# Patient Record
Sex: Female | Born: 1955 | Race: White | Hispanic: No | Smoking: Former smoker
Health system: Southern US, Community
[De-identification: ages and names within clinical notes are randomized; demographics above are authoritative.]

## PROBLEM LIST (undated history)

## (undated) DIAGNOSIS — I10 Essential (primary) hypertension: Secondary | ICD-10-CM

## (undated) DIAGNOSIS — Z8614 Personal history of Methicillin resistant Staphylococcus aureus infection: Secondary | ICD-10-CM

## (undated) DIAGNOSIS — F419 Anxiety disorder, unspecified: Secondary | ICD-10-CM

## (undated) DIAGNOSIS — E119 Type 2 diabetes mellitus without complications: Secondary | ICD-10-CM

## (undated) DIAGNOSIS — F32A Depression, unspecified: Secondary | ICD-10-CM

## (undated) DIAGNOSIS — T4145XA Adverse effect of unspecified anesthetic, initial encounter: Secondary | ICD-10-CM

## (undated) DIAGNOSIS — T8859XA Other complications of anesthesia, initial encounter: Secondary | ICD-10-CM

## (undated) DIAGNOSIS — R42 Dizziness and giddiness: Secondary | ICD-10-CM

## (undated) DIAGNOSIS — R911 Solitary pulmonary nodule: Secondary | ICD-10-CM

## (undated) DIAGNOSIS — Z9889 Other specified postprocedural states: Secondary | ICD-10-CM

## (undated) DIAGNOSIS — I2699 Other pulmonary embolism without acute cor pulmonale: Secondary | ICD-10-CM

## (undated) DIAGNOSIS — J189 Pneumonia, unspecified organism: Secondary | ICD-10-CM

## (undated) DIAGNOSIS — Z87442 Personal history of urinary calculi: Secondary | ICD-10-CM

## (undated) DIAGNOSIS — R112 Nausea with vomiting, unspecified: Secondary | ICD-10-CM

## (undated) DIAGNOSIS — K759 Inflammatory liver disease, unspecified: Secondary | ICD-10-CM

## (undated) DIAGNOSIS — F329 Major depressive disorder, single episode, unspecified: Secondary | ICD-10-CM

## (undated) DIAGNOSIS — Z87898 Personal history of other specified conditions: Secondary | ICD-10-CM

## (undated) DIAGNOSIS — J449 Chronic obstructive pulmonary disease, unspecified: Secondary | ICD-10-CM

## (undated) DIAGNOSIS — I82419 Acute embolism and thrombosis of unspecified femoral vein: Secondary | ICD-10-CM

## (undated) DIAGNOSIS — J45909 Unspecified asthma, uncomplicated: Secondary | ICD-10-CM

## (undated) DIAGNOSIS — Z8719 Personal history of other diseases of the digestive system: Secondary | ICD-10-CM

## (undated) DIAGNOSIS — K219 Gastro-esophageal reflux disease without esophagitis: Secondary | ICD-10-CM

## (undated) DIAGNOSIS — I82409 Acute embolism and thrombosis of unspecified deep veins of unspecified lower extremity: Secondary | ICD-10-CM

## (undated) DIAGNOSIS — G4730 Sleep apnea, unspecified: Secondary | ICD-10-CM

## (undated) HISTORY — PX: DIAGNOSTIC LAPAROSCOPY: SUR761

## (undated) HISTORY — PX: TOTAL KNEE ARTHROPLASTY: SHX125

---

## 1992-05-22 HISTORY — PX: ABDOMINAL HYSTERECTOMY: SHX81

## 2004-06-29 ENCOUNTER — Ambulatory Visit: Payer: Self-pay

## 2004-07-18 ENCOUNTER — Ambulatory Visit: Payer: Self-pay

## 2004-08-28 ENCOUNTER — Emergency Department: Payer: Self-pay | Admitting: Emergency Medicine

## 2005-02-19 ENCOUNTER — Emergency Department: Payer: Self-pay | Admitting: Emergency Medicine

## 2005-03-01 ENCOUNTER — Ambulatory Visit: Payer: Self-pay | Admitting: Family Medicine

## 2005-05-04 ENCOUNTER — Ambulatory Visit: Payer: Self-pay | Admitting: Family Medicine

## 2005-05-22 ENCOUNTER — Other Ambulatory Visit: Payer: Self-pay

## 2005-05-22 ENCOUNTER — Emergency Department: Payer: Self-pay | Admitting: Internal Medicine

## 2005-09-27 ENCOUNTER — Ambulatory Visit: Payer: Self-pay

## 2006-01-15 ENCOUNTER — Ambulatory Visit: Payer: Self-pay | Admitting: Family Medicine

## 2006-01-20 ENCOUNTER — Ambulatory Visit: Payer: Self-pay | Admitting: Family Medicine

## 2006-02-19 ENCOUNTER — Ambulatory Visit: Payer: Self-pay | Admitting: Family Medicine

## 2006-04-09 ENCOUNTER — Emergency Department: Payer: Self-pay | Admitting: Emergency Medicine

## 2006-06-10 ENCOUNTER — Emergency Department: Payer: Self-pay | Admitting: Emergency Medicine

## 2006-10-05 ENCOUNTER — Ambulatory Visit: Payer: Self-pay | Admitting: Family Medicine

## 2006-12-21 ENCOUNTER — Emergency Department: Payer: Self-pay | Admitting: Emergency Medicine

## 2006-12-26 ENCOUNTER — Other Ambulatory Visit: Payer: Self-pay

## 2006-12-26 ENCOUNTER — Ambulatory Visit: Payer: Self-pay

## 2006-12-26 ENCOUNTER — Emergency Department: Payer: Self-pay | Admitting: Emergency Medicine

## 2007-02-20 ENCOUNTER — Emergency Department: Payer: Self-pay | Admitting: Internal Medicine

## 2007-02-20 ENCOUNTER — Other Ambulatory Visit: Payer: Self-pay

## 2007-06-20 ENCOUNTER — Emergency Department: Payer: Self-pay | Admitting: Emergency Medicine

## 2007-07-10 ENCOUNTER — Emergency Department: Payer: Self-pay | Admitting: Emergency Medicine

## 2007-07-15 ENCOUNTER — Emergency Department: Payer: Self-pay | Admitting: Emergency Medicine

## 2007-07-15 ENCOUNTER — Other Ambulatory Visit: Payer: Self-pay

## 2007-08-01 ENCOUNTER — Ambulatory Visit: Payer: Self-pay | Admitting: Family

## 2007-08-02 ENCOUNTER — Ambulatory Visit: Payer: Self-pay | Admitting: Gastroenterology

## 2007-08-09 ENCOUNTER — Ambulatory Visit: Payer: Self-pay | Admitting: General Surgery

## 2007-08-12 ENCOUNTER — Ambulatory Visit: Payer: Self-pay | Admitting: General Surgery

## 2008-03-31 ENCOUNTER — Ambulatory Visit: Payer: Self-pay

## 2008-04-29 ENCOUNTER — Ambulatory Visit: Payer: Self-pay | Admitting: Gastroenterology

## 2008-05-21 ENCOUNTER — Ambulatory Visit: Payer: Self-pay

## 2008-07-13 ENCOUNTER — Ambulatory Visit: Payer: Self-pay | Admitting: Internal Medicine

## 2008-09-24 ENCOUNTER — Observation Stay: Payer: Self-pay | Admitting: Internal Medicine

## 2008-11-15 ENCOUNTER — Emergency Department: Payer: Self-pay | Admitting: Unknown Physician Specialty

## 2009-04-13 ENCOUNTER — Emergency Department: Payer: Self-pay | Admitting: Emergency Medicine

## 2009-06-03 ENCOUNTER — Emergency Department: Payer: Self-pay | Admitting: Emergency Medicine

## 2009-08-03 ENCOUNTER — Emergency Department: Payer: Self-pay | Admitting: Emergency Medicine

## 2009-10-13 ENCOUNTER — Observation Stay: Payer: Self-pay | Admitting: Internal Medicine

## 2010-01-08 ENCOUNTER — Emergency Department: Payer: Self-pay | Admitting: Emergency Medicine

## 2010-01-27 ENCOUNTER — Emergency Department: Payer: Self-pay | Admitting: Emergency Medicine

## 2010-03-08 ENCOUNTER — Emergency Department: Payer: Self-pay | Admitting: Internal Medicine

## 2010-07-11 ENCOUNTER — Observation Stay: Payer: Self-pay | Admitting: Internal Medicine

## 2010-11-08 ENCOUNTER — Ambulatory Visit: Payer: Self-pay | Admitting: Emergency Medicine

## 2010-11-11 LAB — PATHOLOGY REPORT

## 2011-03-25 ENCOUNTER — Emergency Department: Payer: Self-pay | Admitting: Internal Medicine

## 2011-05-23 DIAGNOSIS — Z8614 Personal history of Methicillin resistant Staphylococcus aureus infection: Secondary | ICD-10-CM

## 2011-05-23 HISTORY — PX: CHOLECYSTECTOMY: SHX55

## 2011-05-23 HISTORY — DX: Personal history of Methicillin resistant Staphylococcus aureus infection: Z86.14

## 2011-05-23 HISTORY — PX: CYST REMOVAL NECK: SHX6281

## 2011-05-30 ENCOUNTER — Inpatient Hospital Stay: Payer: Self-pay | Admitting: Surgery

## 2011-05-30 LAB — COMPREHENSIVE METABOLIC PANEL
Albumin: 2.7 g/dL — ABNORMAL LOW (ref 3.4–5.0)
Anion Gap: 9 (ref 7–16)
BUN: 9 mg/dL (ref 7–18)
Bilirubin,Total: 0.8 mg/dL (ref 0.2–1.0)
Chloride: 97 mmol/L — ABNORMAL LOW (ref 98–107)
Co2: 30 mmol/L (ref 21–32)
Creatinine: 0.95 mg/dL (ref 0.60–1.30)
EGFR (African American): >60
EGFR (Non-African Amer.): >60
Glucose: 490 mg/dL — ABNORMAL HIGH (ref 65–99)
Osmolality: 292 (ref 275–301)
Potassium: 3.7 mmol/L (ref 3.5–5.1)
SGOT(AST): 43 U/L — ABNORMAL HIGH (ref 15–37)
Total Protein: 7.4 g/dL (ref 6.4–8.2)

## 2011-05-30 LAB — CBC
HCT: 39.4 % (ref 35.0–47.0)
MCHC: 33.3 g/dL (ref 32.0–36.0)
Platelet: 219 10*3/uL (ref 150–440)
RDW: 13.4 % (ref 11.5–14.5)

## 2011-06-01 LAB — CBC WITH DIFFERENTIAL/PLATELET
Basophil #: 0 10*3/uL (ref 0.0–0.1)
Basophil %: 0.5 %
Eosinophil #: 0.5 10*3/uL (ref 0.0–0.7)
HCT: 31.8 % — ABNORMAL LOW (ref 35.0–47.0)
Lymphocyte #: 1.5 10*3/uL (ref 1.0–3.6)
MCHC: 33.5 g/dL (ref 32.0–36.0)
MCV: 86 fL (ref 80–100)
Monocyte %: 5.6 %
Neutrophil #: 5.5 10*3/uL (ref 1.4–6.5)
RBC: 3.7 10*6/uL — ABNORMAL LOW (ref 3.80–5.20)
RDW: 13.4 % (ref 11.5–14.5)
WBC: 8 10*3/uL (ref 3.6–11.0)

## 2011-06-01 LAB — BASIC METABOLIC PANEL
Calcium, Total: 8.6 mg/dL (ref 8.5–10.1)
Co2: 30 mmol/L (ref 21–32)
Creatinine: 0.54 mg/dL — ABNORMAL LOW (ref 0.60–1.30)
EGFR (Non-African Amer.): >60
Osmolality: 279 (ref 275–301)

## 2011-06-02 LAB — CLOSTRIDIUM DIFFICILE BY PCR

## 2011-06-03 LAB — WOUND CULTURE

## 2011-06-04 LAB — CULTURE, BLOOD (SINGLE)

## 2011-06-05 LAB — CULTURE, BLOOD (SINGLE)

## 2011-06-06 LAB — CULTURE, BLOOD (SINGLE)

## 2011-07-04 LAB — CULTURE, BLOOD (SINGLE)

## 2011-12-05 ENCOUNTER — Emergency Department: Payer: Self-pay | Admitting: Emergency Medicine

## 2011-12-05 LAB — CBC
HCT: 40.8 % (ref 35.0–47.0)
HGB: 13.9 g/dL (ref 12.0–16.0)
MCH: 28.8 pg (ref 26.0–34.0)
MCHC: 34.1 g/dL (ref 32.0–36.0)
RBC: 4.83 10*6/uL (ref 3.80–5.20)
RDW: 13.6 % (ref 11.5–14.5)

## 2011-12-05 LAB — BASIC METABOLIC PANEL
BUN: 14 mg/dL (ref 7–18)
Calcium, Total: 8.9 mg/dL (ref 8.5–10.1)
Co2: 28 mmol/L (ref 21–32)
Creatinine: 0.74 mg/dL (ref 0.60–1.30)
Glucose: 223 mg/dL — ABNORMAL HIGH (ref 65–99)
Osmolality: 285 (ref 275–301)
Potassium: 3.4 mmol/L — ABNORMAL LOW (ref 3.5–5.1)

## 2011-12-07 ENCOUNTER — Ambulatory Visit: Payer: Self-pay | Admitting: Surgery

## 2012-04-14 ENCOUNTER — Ambulatory Visit: Payer: Self-pay | Admitting: Family Medicine

## 2012-04-16 ENCOUNTER — Emergency Department: Payer: Self-pay | Admitting: Emergency Medicine

## 2012-04-16 LAB — URINALYSIS, COMPLETE
Bilirubin,UR: NEGATIVE
Blood: NEGATIVE
Glucose,UR: NEGATIVE mg/dL (ref 0–75)
Ketone: NEGATIVE
Nitrite: NEGATIVE
Ph: 6 (ref 4.5–8.0)
Protein: NEGATIVE
RBC,UR: 5 /HPF (ref 0–5)
Specific Gravity: 1.018 (ref 1.003–1.030)
Squamous Epithelial: 12
WBC UR: 4 /HPF (ref 0–5)

## 2012-04-16 LAB — COMPREHENSIVE METABOLIC PANEL
Anion Gap: 6 — ABNORMAL LOW (ref 7–16)
Calcium, Total: 9 mg/dL (ref 8.5–10.1)
Chloride: 103 mmol/L (ref 98–107)
Co2: 28 mmol/L (ref 21–32)
EGFR (African American): >60
Potassium: 3.6 mmol/L (ref 3.5–5.1)
SGOT(AST): 41 U/L — ABNORMAL HIGH (ref 15–37)
SGPT (ALT): 36 U/L (ref 12–78)
Total Protein: 7.6 g/dL (ref 6.4–8.2)

## 2012-04-16 LAB — CBC
HCT: 43.6 % (ref 35.0–47.0)
MCHC: 35.5 g/dL (ref 32.0–36.0)
Platelet: 256 10*3/uL (ref 150–440)
RBC: 5.26 10*6/uL — ABNORMAL HIGH (ref 3.80–5.20)
RDW: 13.8 % (ref 11.5–14.5)

## 2012-06-06 ENCOUNTER — Ambulatory Visit: Payer: Self-pay | Admitting: Nurse Practitioner

## 2012-06-09 LAB — CBC
HCT: 41 % (ref 35.0–47.0)
HGB: 14.1 g/dL (ref 12.0–16.0)
MCH: 28.7 pg (ref 26.0–34.0)
MCV: 84 fL (ref 80–100)
Platelet: 177 10*3/uL (ref 150–440)
RBC: 4.9 10*6/uL (ref 3.80–5.20)
RDW: 14.2 % (ref 11.5–14.5)
WBC: 5.9 10*3/uL (ref 3.6–11.0)

## 2012-06-09 LAB — CK TOTAL AND CKMB (NOT AT ARMC)
CK, Total: 70 U/L (ref 21–215)
CK-MB: 0.9 ng/mL (ref 0.5–3.6)

## 2012-06-09 LAB — BASIC METABOLIC PANEL
Anion Gap: 7 (ref 7–16)
BUN: 9 mg/dL (ref 7–18)
Co2: 28 mmol/L (ref 21–32)
EGFR (African American): >60
EGFR (Non-African Amer.): >60
Glucose: 321 mg/dL — ABNORMAL HIGH (ref 65–99)
Potassium: 3.7 mmol/L (ref 3.5–5.1)

## 2012-06-09 LAB — HEMOGLOBIN A1C: Hemoglobin A1C: 9.6 % — ABNORMAL HIGH (ref 4.2–6.3)

## 2012-06-11 ENCOUNTER — Inpatient Hospital Stay: Payer: Self-pay | Admitting: Internal Medicine

## 2012-06-11 LAB — BASIC METABOLIC PANEL
Anion Gap: 9 (ref 7–16)
Co2: 23 mmol/L (ref 21–32)
EGFR (African American): >60
EGFR (Non-African Amer.): >60
Glucose: 396 mg/dL — ABNORMAL HIGH (ref 65–99)
Osmolality: 290 (ref 275–301)
Potassium: 4.2 mmol/L (ref 3.5–5.1)
Sodium: 135 mmol/L — ABNORMAL LOW (ref 136–145)

## 2012-06-11 LAB — CBC WITH DIFFERENTIAL/PLATELET
Basophil #: 0 10*3/uL (ref 0.0–0.1)
Basophil %: 0.1 %
HGB: 14.5 g/dL (ref 12.0–16.0)
Lymphocyte %: 6.4 %
MCH: 29.2 pg (ref 26.0–34.0)
Monocyte #: 0.2 x10 3/mm (ref 0.2–0.9)
Monocyte %: 2.3 %
Platelet: 199 10*3/uL (ref 150–440)
RDW: 14.6 % — ABNORMAL HIGH (ref 11.5–14.5)
WBC: 9.9 10*3/uL (ref 3.6–11.0)

## 2012-06-12 LAB — BASIC METABOLIC PANEL
BUN: 21 mg/dL — ABNORMAL HIGH (ref 7–18)
Calcium, Total: 8.6 mg/dL (ref 8.5–10.1)
Chloride: 108 mmol/L — ABNORMAL HIGH (ref 98–107)
Co2: 25 mmol/L (ref 21–32)
EGFR (African American): >60
Glucose: 147 mg/dL — ABNORMAL HIGH (ref 65–99)
Osmolality: 287 (ref 275–301)
Potassium: 3.7 mmol/L (ref 3.5–5.1)

## 2012-07-24 ENCOUNTER — Ambulatory Visit: Payer: Self-pay | Admitting: Internal Medicine

## 2012-08-26 ENCOUNTER — Ambulatory Visit: Payer: Self-pay | Admitting: Emergency Medicine

## 2012-10-21 ENCOUNTER — Emergency Department: Payer: Self-pay | Admitting: Emergency Medicine

## 2012-10-21 LAB — CK TOTAL AND CKMB (NOT AT ARMC): CK, Total: 39 U/L (ref 21–215)

## 2012-10-21 LAB — BASIC METABOLIC PANEL
Anion Gap: 5 — ABNORMAL LOW (ref 7–16)
BUN: 13 mg/dL (ref 7–18)
Chloride: 105 mmol/L (ref 98–107)
Co2: 29 mmol/L (ref 21–32)
EGFR (African American): >60
EGFR (Non-African Amer.): >60
Glucose: 162 mg/dL — ABNORMAL HIGH (ref 65–99)

## 2012-10-21 LAB — CBC
HCT: 40.7 % (ref 35.0–47.0)
MCH: 28.3 pg (ref 26.0–34.0)
MCHC: 34.3 g/dL (ref 32.0–36.0)
MCV: 82 fL (ref 80–100)
Platelet: 221 10*3/uL (ref 150–440)
RBC: 4.94 10*6/uL (ref 3.80–5.20)
RDW: 14.2 % (ref 11.5–14.5)
WBC: 9.7 10*3/uL (ref 3.6–11.0)

## 2013-01-14 ENCOUNTER — Emergency Department: Payer: Self-pay | Admitting: Emergency Medicine

## 2013-01-14 LAB — CBC WITH DIFFERENTIAL/PLATELET
Basophil #: 0.1 10*3/uL (ref 0.0–0.1)
Basophil %: 1 %
Eosinophil #: 0.4 10*3/uL (ref 0.0–0.7)
HCT: 42.7 % (ref 35.0–47.0)
HGB: 14.9 g/dL (ref 12.0–16.0)
Lymphocyte #: 1.7 10*3/uL (ref 1.0–3.6)
MCHC: 34.9 g/dL (ref 32.0–36.0)
MCV: 82 fL (ref 80–100)
Monocyte #: 0.4 x10 3/mm (ref 0.2–0.9)
Monocyte %: 4.3 %
Neutrophil #: 5.6 10*3/uL (ref 1.4–6.5)
Platelet: 221 10*3/uL (ref 150–440)
RBC: 5.21 10*6/uL — ABNORMAL HIGH (ref 3.80–5.20)
RDW: 14.2 % (ref 11.5–14.5)
WBC: 8.2 10*3/uL (ref 3.6–11.0)

## 2013-01-27 ENCOUNTER — Emergency Department: Payer: Self-pay | Admitting: Emergency Medicine

## 2013-01-27 LAB — BASIC METABOLIC PANEL
Anion Gap: 5 — ABNORMAL LOW (ref 7–16)
BUN: 26 mg/dL — ABNORMAL HIGH (ref 7–18)
Calcium, Total: 9.3 mg/dL (ref 8.5–10.1)
Co2: 31 mmol/L (ref 21–32)
EGFR (Non-African Amer.): >60
Glucose: 279 mg/dL — ABNORMAL HIGH (ref 65–99)

## 2013-01-27 LAB — CBC
HGB: 15.5 g/dL (ref 12.0–16.0)
MCV: 82 fL (ref 80–100)
Platelet: 255 10*3/uL (ref 150–440)
RDW: 14.2 % (ref 11.5–14.5)
WBC: 13.2 10*3/uL — ABNORMAL HIGH (ref 3.6–11.0)

## 2013-04-24 ENCOUNTER — Observation Stay: Payer: Self-pay | Admitting: Internal Medicine

## 2013-04-24 LAB — CBC WITH DIFFERENTIAL/PLATELET
Basophil %: 1.3 %
Eosinophil #: 0.4 10*3/uL (ref 0.0–0.7)
HCT: 46 % (ref 35.0–47.0)
HGB: 15.8 g/dL (ref 12.0–16.0)
Lymphocyte %: 29.5 %
MCH: 28.5 pg (ref 26.0–34.0)
MCHC: 34.3 g/dL (ref 32.0–36.0)
Monocyte #: 0.7 x10 3/mm (ref 0.2–0.9)
Neutrophil %: 59.4 %
RDW: 14 % (ref 11.5–14.5)
WBC: 10.7 10*3/uL (ref 3.6–11.0)

## 2013-04-24 LAB — LIPASE, BLOOD: Lipase: 116 U/L (ref 73–393)

## 2013-04-24 LAB — URINALYSIS, COMPLETE
Bilirubin,UR: NEGATIVE
Blood: NEGATIVE
Ketone: NEGATIVE
Leukocyte Esterase: NEGATIVE
Nitrite: NEGATIVE
Ph: 6 (ref 4.5–8.0)
Protein: NEGATIVE
RBC,UR: <1 /HPF (ref 0–5)
Specific Gravity: 1.02 (ref 1.003–1.030)
Squamous Epithelial: 1
WBC UR: <1 /HPF (ref 0–5)

## 2013-04-24 LAB — COMPREHENSIVE METABOLIC PANEL
Alkaline Phosphatase: 114 U/L
BUN: 19 mg/dL — ABNORMAL HIGH (ref 7–18)
Calcium, Total: 9.3 mg/dL (ref 8.5–10.1)
Chloride: 98 mmol/L (ref 98–107)
Co2: 31 mmol/L (ref 21–32)
EGFR (African American): >60
Potassium: 3.5 mmol/L (ref 3.5–5.1)
SGOT(AST): 57 U/L — ABNORMAL HIGH (ref 15–37)
SGPT (ALT): 57 U/L (ref 12–78)
Total Protein: 7.6 g/dL (ref 6.4–8.2)

## 2013-04-25 LAB — CBC WITH DIFFERENTIAL/PLATELET
Basophil #: 0.1 10*3/uL (ref 0.0–0.1)
Basophil %: 0.9 %
Eosinophil #: 0.2 10*3/uL (ref 0.0–0.7)
Eosinophil %: 2.3 %
HCT: 40.8 % (ref 35.0–47.0)
Lymphocyte #: 2.1 10*3/uL (ref 1.0–3.6)
MCH: 29.2 pg (ref 26.0–34.0)
MCHC: 34.9 g/dL (ref 32.0–36.0)
MCV: 84 fL (ref 80–100)
Monocyte #: 0.4 x10 3/mm (ref 0.2–0.9)
Monocyte %: 5.9 %
Neutrophil #: 4.7 10*3/uL (ref 1.4–6.5)
Platelet: 201 10*3/uL (ref 150–440)
RBC: 4.86 10*6/uL (ref 3.80–5.20)
RDW: 13.9 % (ref 11.5–14.5)
WBC: 7.5 10*3/uL (ref 3.6–11.0)

## 2013-04-25 LAB — MAGNESIUM: Magnesium: 1.6 mg/dL — ABNORMAL LOW

## 2013-04-25 LAB — COMPREHENSIVE METABOLIC PANEL
Alkaline Phosphatase: 97 U/L
BUN: 16 mg/dL (ref 7–18)
Calcium, Total: 8.5 mg/dL (ref 8.5–10.1)
EGFR (African American): >60
Glucose: 214 mg/dL — ABNORMAL HIGH (ref 65–99)
SGOT(AST): 52 U/L — ABNORMAL HIGH (ref 15–37)
Sodium: 136 mmol/L (ref 136–145)

## 2013-05-22 HISTORY — PX: LAPAROSCOPIC GASTRIC SLEEVE RESECTION: SHX5895

## 2013-06-04 ENCOUNTER — Emergency Department: Payer: Self-pay | Admitting: Emergency Medicine

## 2013-06-04 LAB — URINALYSIS, COMPLETE
Glucose,UR: >=500 mg/dL (ref 0–75)
LEUKOCYTE ESTERASE: NEGATIVE
Nitrite: NEGATIVE
Ph: 6 (ref 4.5–8.0)
Protein: 30
RBC,UR: 13 /HPF (ref 0–5)
Specific Gravity: 1.038 (ref 1.003–1.030)

## 2013-06-04 LAB — BASIC METABOLIC PANEL
Anion Gap: 9 (ref 7–16)
BUN: 14 mg/dL (ref 7–18)
CREATININE: 0.82 mg/dL (ref 0.60–1.30)
Calcium, Total: 9.4 mg/dL (ref 8.5–10.1)
Chloride: 96 mmol/L — ABNORMAL LOW (ref 98–107)
Co2: 26 mmol/L (ref 21–32)
EGFR (African American): >60
Glucose: 458 mg/dL — ABNORMAL HIGH (ref 65–99)
OSMOLALITY: 283 (ref 275–301)
POTASSIUM: 3.9 mmol/L (ref 3.5–5.1)
Sodium: 131 mmol/L — ABNORMAL LOW (ref 136–145)

## 2013-06-04 LAB — CBC WITH DIFFERENTIAL/PLATELET
BASOS ABS: 0.1 10*3/uL (ref 0.0–0.1)
Basophil %: 0.6 %
EOS ABS: 0.2 10*3/uL (ref 0.0–0.7)
EOS PCT: 2.1 %
HCT: 50.5 % — ABNORMAL HIGH (ref 35.0–47.0)
HGB: 17.8 g/dL — AB (ref 12.0–16.0)
Lymphocyte #: 3.4 10*3/uL (ref 1.0–3.6)
Lymphocyte %: 36 %
MCH: 28.9 pg (ref 26.0–34.0)
MCHC: 35.3 g/dL (ref 32.0–36.0)
MCV: 82 fL (ref 80–100)
MONO ABS: 0.7 x10 3/mm (ref 0.2–0.9)
MONOS PCT: 7.1 %
NEUTROS PCT: 54.2 %
Neutrophil #: 5.2 10*3/uL (ref 1.4–6.5)
Platelet: 240 10*3/uL (ref 150–440)
RBC: 6.17 10*6/uL — ABNORMAL HIGH (ref 3.80–5.20)
RDW: 13.6 % (ref 11.5–14.5)
WBC: 9.5 10*3/uL (ref 3.6–11.0)

## 2013-06-04 LAB — TROPONIN I

## 2013-07-28 ENCOUNTER — Emergency Department: Payer: Self-pay | Admitting: Emergency Medicine

## 2013-07-28 LAB — BASIC METABOLIC PANEL
ANION GAP: 6 — AB (ref 7–16)
BUN: 16 mg/dL (ref 7–18)
CO2: 28 mmol/L (ref 21–32)
CREATININE: 0.69 mg/dL (ref 0.60–1.30)
Calcium, Total: 8.9 mg/dL (ref 8.5–10.1)
Chloride: 107 mmol/L (ref 98–107)
EGFR (African American): >60
EGFR (Non-African Amer.): >60
Glucose: 148 mg/dL — ABNORMAL HIGH (ref 65–99)
Osmolality: 285 (ref 275–301)
Potassium: 3.7 mmol/L (ref 3.5–5.1)
SODIUM: 141 mmol/L (ref 136–145)

## 2013-07-28 LAB — CBC
HCT: 42.7 % (ref 35.0–47.0)
HGB: 15 g/dL (ref 12.0–16.0)
MCH: 29.8 pg (ref 26.0–34.0)
MCHC: 35.1 g/dL (ref 32.0–36.0)
MCV: 85 fL (ref 80–100)
Platelet: 221 10*3/uL (ref 150–440)
RBC: 5.02 10*6/uL (ref 3.80–5.20)
RDW: 14.6 % — AB (ref 11.5–14.5)
WBC: 9.6 10*3/uL (ref 3.6–11.0)

## 2013-07-28 LAB — TROPONIN I

## 2013-07-28 LAB — PROTIME-INR
INR: 1
Prothrombin Time: 12.6 secs (ref 11.5–14.7)

## 2013-08-07 ENCOUNTER — Ambulatory Visit: Payer: Self-pay | Admitting: Internal Medicine

## 2013-10-28 ENCOUNTER — Emergency Department: Payer: Self-pay | Admitting: Emergency Medicine

## 2013-11-11 ENCOUNTER — Ambulatory Visit: Payer: Self-pay | Admitting: Physician Assistant

## 2014-01-12 ENCOUNTER — Ambulatory Visit: Payer: Self-pay | Admitting: Specialist

## 2014-03-25 ENCOUNTER — Emergency Department: Payer: Self-pay | Admitting: Emergency Medicine

## 2014-03-25 LAB — CBC
HCT: 40.8 % (ref 35.0–47.0)
HGB: 13.6 g/dL (ref 12.0–16.0)
MCH: 28.4 pg (ref 26.0–34.0)
MCHC: 33.3 g/dL (ref 32.0–36.0)
MCV: 85 fL (ref 80–100)
Platelet: 184 10*3/uL (ref 150–440)
RBC: 4.78 10*6/uL (ref 3.80–5.20)
RDW: 13.7 % (ref 11.5–14.5)
WBC: 8.2 10*3/uL (ref 3.6–11.0)

## 2014-03-25 LAB — BASIC METABOLIC PANEL
Anion Gap: 9 (ref 7–16)
BUN: 16 mg/dL (ref 7–18)
Calcium, Total: 8.7 mg/dL (ref 8.5–10.1)
Chloride: 101 mmol/L (ref 98–107)
Co2: 26 mmol/L (ref 21–32)
Creatinine: 0.72 mg/dL (ref 0.60–1.30)
EGFR (African American): >60
EGFR (Non-African Amer.): >60
Glucose: 357 mg/dL — ABNORMAL HIGH (ref 65–99)
Osmolality: 288 (ref 275–301)
POTASSIUM: 5.2 mmol/L — AB (ref 3.5–5.1)
SODIUM: 136 mmol/L (ref 136–145)

## 2014-03-25 LAB — TROPONIN I

## 2014-08-10 ENCOUNTER — Ambulatory Visit: Payer: Self-pay | Admitting: Internal Medicine

## 2014-09-11 NOTE — Consult Note (Signed)
Brief Consult Note: Diagnosis: abd pain.   Patient was seen by consultant.   Consult note dictated.   Recommend further assessment or treatment.   Comments: RLQpain following one week of diarrhea. suspect paradoxic diarrhea due to considerable rt side fecal impaction. no sign of appendicitis. will follow.  Electronic Signatures: Florene Glen (MD)  (Signed 05-Dec-14 13:45)  Authored: Brief Consult Note   Last Updated: 05-Dec-14 13:45 by Florene Glen (MD)

## 2014-09-11 NOTE — Consult Note (Signed)
PATIENT NAME:  Andrea Pollard, CREPEAU MR#:  [id number] DATE OF BIRTH:  04/19/1956  DATE OF CONSULTATION:  04/25/2013  REFERRING PHYSICIAN:   CONSULTING PHYSICIAN:  Lucilla Lame, MD  CONSULTING SERVICE:  Gastroenterology.   REASON FOR CONSULTATION:  Abdominal pain with nausea and vomiting.   HISTORY OF PRESENT ILLNESS:  This patient is a 59 year old woman who has been admitted with nausea, vomiting and abdominal pain. The patient reports that the abdominal pain is on the right side, across the top of the abdomen and down the left side. She states that the abdominal pain is mostly in the right but hurts on the other places also. She also had some nausea, vomiting and states she could not keep things down. She has been feeling better since being admitted and has been treated with MiraLAX and is about to get a Fleet enema. The patient had a CT scan that showed her to have stool in her colon. There is no report of any black stools or bloody stools. She also denies any vomiting blood. The patient had a colonoscopy by me a few years ago and had four 8-mm polyps found and removed at that time. The patient had another colonoscopy by Dr. Phylis Bougie 2 years ago that she reports to have been normal. The patient is feeling better, but she still has twinges of pain in the right lower quadrant. There is no report of any symptoms like this in the past.   PAST MEDICAL HISTORY: 1.  Diabetes.  2.  Hypertension.  3.  Hyperlipidemia. 4.  GERD. 5.  Gastroparesis.  6.  Anxiety, depression.   ALLERGIES:  BACTRIM, LEVAQUIN, NITROGLYCERIN.   FAMILY HISTORY:  Noncontributory.   SOCIAL HISTORY:  No tobacco, alcohol or drug abuse.   MEDICATIONS: 1.  Advair.  2.  DuoNebs.  3.  Alprazolam.  4.  Aspirin.  5.  Bupropion.  6.  Citalopram. 7.  Ventolin. 8.  Insulin. 9.  Lisinopril.  10.  Lipitor 11.  Reglan. 12.  NovoLog.  13.  Protonix.  14.  Proventil. 15.  Zofran.   REVIEW OF SYSTEMS:  A 10-point review of system  negative except what was stated above.   PHYSICAL EXAMINATION:  GENERAL:  The patient sitting up in bed, in no apparent distress, speaking in full sentences.  VITAL SIGNS:  Temperature 97.4, pulse 90, respirations 20, blood pressure 127/80, pulse oximetry 98% on room air.  HEENT:  Normocephalic, atraumatic. Extraocular motor intact. Pupils equally round and reactive to light and accommodation, without JVD, without lymphadenopathy.  LUNGS:  Clear to auscultation bilaterally.  HEART:  Regular rate and rhythm without murmurs, rubs or gallops.  ABDOMEN:  Soft with tenderness throughout her abdomen, mostly at the right lower quadrant. The mid abdomen was tympanic with the rest of the flanks being dull.  EXTREMITIES:  Without cyanosis, clubbing or edema.   NEUROLOGICAL:  Grossly intact.  SKIN:  Without any rashes or lesions.   LABORATORY RESULTS:  CT scan showing significant fatty infiltration of the liver, moderate stool noted in the right colon and cecum, normal appendix, no bowel obstruction, no hydronephrosis. Labs showed normal white cell count of 7.5 with hemoglobin of 14.2, hematocrit of 40.8.   ASSESSMENT AND PLAN:  This patient is a 59 year old woman who has what appears to be stool in her cecum and descending colon and consistent with constipation and overflow diarrhea. The patient will be treated with laxatives to try to clean her out. There is no sign of acute  infection or appendicitis at this time. The patient has been explained the plan. There is no need for a repeat colonoscopy at this time.   Thank you very much for involving me in the care of this patient. If you have any questions, please do not hesitate to call.  ____________________________ Lucilla Lame, MD dw:jm D: 04/25/2013 14:39:34 ET T: 04/25/2013 15:16:16 ET JOB#: [id number]  cc: Lucilla Lame, MD, <Dictator> Lucilla Lame MD ELECTRONICALLY SIGNED 04/25/2013 17:41

## 2014-09-11 NOTE — Discharge Summary (Signed)
PATIENT NAME:  Andrea Pollard, Andrea Pollard MR#:  [id number] DATE OF BIRTH:  1955-10-19  DATE OF ADMISSION:  06/11/2012 DATE OF DISCHARGE:  06/14/2012  PRIMARY CARE PHYSICIAN: Dr. Quay Burow.  CONSULTING PHYSICIAN: Dr. Gabriel Carina, endocrinology.  DISCHARGE DIAGNOSES: 1. Asthma exacerbation, likely due to acute bronchitis.  2. Hyperglycemia with uncontrolled diabetes.  3. Dehydration.  4. Hypertension.  5. Diabetic gastroparesis.  6. Obstructive sleep apnea.  7. Obesity.   CODE STATUS: Full code.   CONDITION: Stable.   HOME MEDICATIONS:  1. Advair 500 mcg/50 mcg inhalation powder 1 puff twice a day.  2. Proventil HFA 90 mcg inhalation 2 puffs every 4 hours p.r.n.  3. Alprazolam 0.5 mg p.o. at bedtime.  4. Aspirin 81 mg p.o. daily. 5. Citalopram 40 mg p.o. at bedtime. 6. Lipitor 10 mg p.o. at bedtime.  7. Lisinopril 5 mg p.o. daily.  8. Multivitamin 1 tablet p.o. daily. 9. Protonix 40 mg p.o. daily. 10. Reglan 10 mg p.o. 4 times a day before meals and at bedtime. 11. Zofran 4 mg p.o. every 6 hours p.r.n. for vomiting. 12. Lantus 40 units subcutaneous every 12 hours.  13. NovoLog 20 units subcutaneous 3 times a day before meals.  14. Prednisone 20 mg p.o. daily for 2 days, then taper.   STOPPED MEDICATION: Ibuprofen.   DIET: Low sodium, low fat, low cholesterol, ADA diet.   ACTIVITY: As tolerated. The patient needs CPAP at night.  FOLLOWUP CARE: Follow up with PCP within 1 to 2 weeks. Follow up with Dr. Gabriel Carina within 1 to 2 weeks.   REASON FOR ADMISSION: Wheezing and shortness of breath.   HOSPITAL COURSE: The patient is a 60 year old Caucasian female with a history of diabetes, hypertension, hyperlipidemia, GERD, diabetic gastroparesis, who presented to the ED with shortness of breath and wheezing for 2 days. The patient was treated with nebulizer as outpatient without relief. In the ED, the patient was hypoxemic with an O2 sat decreased to mid 80s. The patient was admitted for asthma  exacerbation secondary to acute bronchitis. After admission, the patient was placed with Solu-Medrol IVPB with nebulizer, Advair and Zithromax. The patient's symptoms have much improved.   Hyperglycemia and uncontrolled diabetes. The patient's blood sugar has been not very well controlled. After steroid treatment, the patient's blood sugar increased to about 400 to 500. She was treated with sliding scale with Lantus 40 units at bedtime, but the patient's blood sugar was still not controlled, so we started insulin drip, and the patient was transferred to CCU. The patient's blood sugar was under control after starting insulin drip, so we started Lantus 40 units q.12 hours with NovoLog 20 units before meals and then stopped insulin drip. Under such regimen, the patient's blood sugar has been controlled. The last blood sugar was 112. The patient is clinically stable and will be discharged to home today. I discussed the patient's discharge plan with the patient and the case manager.   TIME SPENT: About 36 minutes.    ____________________________ Demetrios Loll, MD qc:OSi D: 06/14/2012 15:01:00 ET T: 06/15/2012 05:50:47 ET JOB#: [id number]  cc: Demetrios Loll, MD, <Dictator> Demetrios Loll MD ELECTRONICALLY SIGNED 06/15/2012 12:43

## 2014-09-11 NOTE — Consult Note (Signed)
PATIENT NAME:  Andrea Pollard, Andrea Pollard MR#:  [id number] DATE OF BIRTH:  05/05/1956  DATE OF CONSULTATION:  04/25/2013  CONSULTING PHYSICIAN:  Jerrol Banana. Burt Knack, MD  CHIEF COMPLAINT: Right lower quadrant abdominal pain.   HISTORY OF PRESENT ILLNESS: This is a patient with a history of two weeks of diarrhea that stopped yesterday. She has not had a bowel movement in at least 24, possibly 36 hours. She has had multiple loose stools over the last 2 weeks. She denies fevers or chills and states that she started having right lower quadrant abdominal pain starting 2 days ago. When asked about the location of the pain, she points to the right lower quadrant and draws her hand up her right side, across her upper abdomen and down her left side, essentially tracing the course of the right transverse and left colons. She denies melena or hematochezia. She had a colonoscopy 2 years ago. She has had some nausea and 2 emesis over the last 2 days.   PAST MEDICAL HISTORY: Diabetes, hypertension, hyperlipidemia, reflux disease, gastroparesis and anxiety.   PAST SURGICAL HISTORY: Cholecystectomy, infected sebaceous cyst of the neck.   FAMILY HISTORY: Noncontributory.   SOCIAL HISTORY: The patient does not smoke or drink.   MEDICATIONS: Multiple, see chart.   ALLERGIES: BACTRIM AND LEVAQUIN.   REVIEW OF SYSTEM:  A 10 system review is performed and negative with the exception of that mentioned in the history of present illness.   PHYSICAL EXAMINATION: GENERAL: Healthy, comfortable-appearing female patient. She moves readily in the bed without splinting.  VITAL SIGNS: Temperature of 98.1, pulse 96, respirations 20, blood pressure 137/88, 96% room air sat (She is wearing a CPAP mask at the time of initial interview).  HEENT: Poor dentition.  NECK: No palpable neck nodes. No scleral icterus.  CHEST: Bilateral rhonchi.  CARDIAC: Regular rate and rhythm.  ABDOMEN: Obese, otherwise soft, minimally tender in the right  lower quadrant. No guarding, no rebound, no percussion tenderness. Negative Rovsing sign. Scars are noted from prior laparoscopy.  EXTREMITIES: Show moderate edema.  NEUROLOGIC: Grossly intact.  INTEGUMENT: No jaundice.   BMI of 43 weight of 256.   CT scan is personally reviewed showing a normal appendix.   LABORATORY VALUES: Show a depressed magnesium at 1.6. Otherwise, electrolytes are within normal limits. White blood cell count is 7.5, hemoglobin and hematocrit of 14 and 41, with a platelet count 201.   ASSESSMENT AND PLAN: This is a patient with considerable stool in the right colon seen on CT scan. I believe that she is having paradoxic diarrhea related to her right colonic fecal impaction. I do not see signs of appendicitis, but I will follow the patient with you and re-examine her.     ____________________________ Jerrol Banana. Burt Knack, MD rec:dp D: 04/25/2013 15:54:50 ET T: 04/25/2013 16:22:23 ET JOB#: [id number]  cc: Jerrol Banana. Burt Knack, MD, <Dictator> Florene Glen MD ELECTRONICALLY SIGNED 04/25/2013 16:51

## 2014-09-11 NOTE — Discharge Summary (Signed)
PATIENT NAME:  Andrea Pollard, Andrea Pollard MR#:  [id number] DATE OF BIRTH:  1956-03-18  DATE OF ADMISSION:  04/24/2013 DATE OF DISCHARGE:  04/26/2013  PRIMARY CARE PHYSICIAN:  Dr. Quay Burow.  CONSULTATION:  Surgery with Dr. Burt Knack and GI, Dr. Allen Norris.  DISCHARGE DIAGNOSES: 1. Right side abdominal pain due to constipation.  2.  Hypertension.  3.  Diabetes.   CONDITION:  Stable.  CODE STATUS:  FULL CODE.   HOME MEDICATIONS:  Please refer to the medication reconciliation list.   DIET:  Low-sodium, low-fat, low-cholesterol, ADA diet.   ACTIVITY:  As tolerated.  FOLLOWUP CARE:  Followup with PCP within 2 to 4 weeks.   REASON FOR ADMISSION:  Abdominal pain, nausea and vomiting.   HOSPITAL COURSE:  The patient is a 59 year old Caucasian female with a history of hypertension, diabetes, gastroparesis, presented to the ED with abdominal pain, nausea and vomiting. The patient was treated with the Dilaudid, morphine, Phenergan in the ED. For detailed history and physical examination, please refer to the admission note dictated by Dr. Manuella Ghazi.   On admission date, laboratory data showed normal BMP, normal liver function tests, except AST 57. Troponin normal. CBC is normal. Urinalysis is negative. A CAT scan of the abdomen and pelvis showed fatty infiltration of the liver, moderate stool in the right colon and the cecum.  1.  Abdominal pain, possibly due to constipation. After admission, the patient has been treated with Reglan and Dilaudid. In addition, GI physician, Dr. Allen Norris, suggested to continue laxatives including an enema, Colace, MiraLAX, Senna. After above mentioned treatment, the patient's symptoms have much improved. The patient has no abdominal pain, nausea, vomiting. The patient had 5 bowel movements since yesterday, which is watery with solid pieces.  2.  Hypertension is controlled.  3.  Diabetes has been treated with a sliding scale.   The patient is clinically stable. She will be discharged to home  today. I discussed the patient's discharge plan with the patient and nurse.   TIME SPENT:  About 33 minutes.   ____________________________ Demetrios Loll, MD qc:jm D: 04/26/2013 15:31:09 ET T: 04/26/2013 17:43:48 ET JOB#: [id number]  cc: Demetrios Loll, MD, <Dictator> Demetrios Loll MD ELECTRONICALLY SIGNED 04/27/2013 12:50

## 2014-09-11 NOTE — Consult Note (Signed)
Chief Complaint:  Subjective/Chief Complaint Patient reporting less abd pain. Some small results with her laxatives. Not needing pain medications now.   Brief Assessment:  GEN well developed, well nourished, no acute distress, obese   Respiratory normal resp effort  no use of accessory muscles   Additional Physical Exam Alert and orientated times 3   Assessment/Plan:  Assessment/Plan:  Assessment Right lower abd. pain   Plan The patient is feeling better and not needing pain medications. She will continue the laxatives.   Electronic Signatures: Lucilla Lame (MD)  (Signed 06-Dec-14 10:05)  Authored: Chief Complaint, Brief Assessment, Assessment/Plan   Last Updated: 06-Dec-14 10:05 by Lucilla Lame (MD)

## 2014-09-11 NOTE — Consult Note (Signed)
Chief Complaint and History:   Referring Physician Dr. Bridgett Larsson    Chief Complaint Uncontrolled diabetes   Allergies:  Levaquin: Hives  Bactrim: Hives  Nitroglycerin: Unknown  Assessment/Plan:   Assessment/Plan Patient was seen, examined, interviewed, and chart was reviewed. She has a 10 yr h/o type 1 diabetes and started insulin less than one year ago. Per pt, her diabetes is chronically uncontrolled. A1c on 06/10/11 was 9.6%. Hyperglycemia worsened by steroids. She is noe on Prednisone 40 mg daily. She was on an insulin drip until yesterday and then Lantus 40 units qHS + NovoLog SSI was started. She has a good appetite and is eating most of meal trays. Sugar since insulin was stopped ranges 179-411.   A/ Uncontrolled diabetes  P/ Continue Lantus 40 units. Agree with starting Novolog 20 units tid AC Agree with addition of metformin 500 mg bid Appreciate assistance of nutritionists  A full consult will be dictated. I will follow with you and set up clinic follow-up.    Case Discussed With patient   Electronic Signatures: Judi Cong (MD)  (Signed 23-Jan-14 15:42)  Authored: Chief Complaint and History, ALLERGIES, Assessment/Plan   Last Updated: 23-Jan-14 15:42 by Judi Cong (MD)

## 2014-09-11 NOTE — Consult Note (Signed)
Brief Consult Note: Diagnosis: Patient with abd pain over all aseas of her colon with worse pain in the RLQ. The patient had a CT scan showing a colon full of stool.   Patient was seen by consultant.   Consult note dictated.   Comments: The patient has abd pain with a colon full of stool and overflow diarrhea. She will be treated with enemas and laxatives. She has had a colonoscopy 2 years ago and by me a few years before that. Will follow along with you. No need to repeat colonoscopy at this time.  Electronic Signatures: Lucilla Lame (MD)  (Signed 05-Dec-14 14:32)  Authored: Brief Consult Note   Last Updated: 05-Dec-14 14:32 by Lucilla Lame (MD)

## 2014-09-11 NOTE — H&P (Signed)
PATIENT NAME:  Andrea Pollard, Andrea Pollard MR#:  [id number] DATE OF BIRTH:  12-27-1955  DATE OF ADMISSION:  06/09/2012  PRIMARY CARE PHYSICIAN:  Ellamae Sia, MD  CHIEF COMPLAINT:  Wheezing and shortness of breath.   HISTORY OF PRESENT ILLNESS:  This is a 59 year old female who comes into the Emergency Room due to shortness of breath and wheezing ongoing for the past 2 days. The patient said she went to see her primary care physician who put her on an albuterol nebulizer and she has been using it for the past few days, but it has not alleviated her symptoms. She therefore came to the ER for further evaluation. In the Emergency Room, the patient received some Solu-Medrol and also some nebulizer treatments, but upon ambulation still became hypoxic with O2 sats dropping to the mid-80s. Hospitalist service was contacted for further treatment and evaluation. The patient does admit to some postnasal drip and a cough which is nonproductive, but no fevers, no chills. No nausea, no vomiting, no abdominal pain, no other associated symptoms. Hospitalist service was contacted for treatment of asthma exacerbation.   REVIEW OF SYSTEMS:  CONSTITUTIONAL: No documented fever. No weight gain or weight loss.  EYES: No blurred or double vision.  ENT: No tinnitus. No postnasal drip. No redness of the oropharynx.  RESPIRATORY: Positive cough. Positive wheeze. Positive shortness of breath. Positive asthma. No COPD.  CARDIOVASCULAR: No chest pain, no orthopnea, no palpitations, no syncope.  GASTROINTESTINAL: No nausea, no vomiting, no diarrhea, no abdominal pain, no melena, no hematochezia.  GENITOURINARY: No dysuria or hematuria.  ENDOCRINE: No polyuria or nocturia. No heat or cold intolerance.  HEMATOLOGIC: No anemia, no bruising, no bleeding.  INTEGUMENTARY: No rashes. No lesions.  MUSCULOSKELETAL: No arthritis, no swelling, and no gout.  NEUROLOGIC: No numbness or tingling. No ataxia, no seizure-type activity.   PSYCHIATRIC: Positive anxiety. Positive depression. No ADD.   PAST MEDICAL HISTORY:  Consistent with diabetes, hypertension, hyperlipidemia, GERD, diabetic gastroparesis, anxiety/depression.   ALLERGIES:  BACTRIM, LEVAQUIN AND NITROGLYCERIN. BACTRIM AND LEVAQUIN CAUSE HIVES AND NITROGLYCERIN CAUSES HYPOTENSION.   FAMILY HISTORY:  The patient's mother is alive and healthy. Father died from complications of a stroke.   CURRENT MEDICATIONS:  Advair 500/50 mcg 1 puff b.i.d., albuterol inhaler 2 puffs q. 6 hours as needed, Xanax 0.5 mg at bedtime, aspirin 81 mg daily, Celexa 40 mg at bedtime, ibuprofen 600 mg q. 6 hours as needed, Lantus 40 units at bedtime, Lipitor 10 mg daily, lisinopril 5 mg daily, multivitamin daily, NovoLog 40 units t.i.d. with meals, Protonix 40 mg daily, Zofran as needed and Reglan 10 mg q.i.d. with meals.   PHYSICAL EXAMINATION ON ADMISSION:  VITAL SIGNS: Temperature 99.9, pulse 105, respirations 20, blood pressure 120/65, sats 91% on room air.  GENERAL: She is a pleasant appearing female in no apparent distress.  HEENT: Atraumatic, normocephalic. Her extraocular muscles are intact. Pupils equal, reactive to light. Sclerae anicteric. No conjunctival injection. No pharyngeal erythema.  NECK: Supple. There is no jugular venous distention, no bruits, no lymphadenopathy or thyromegaly.  HEART: Tachycardic, regular. No murmurs, rubs, or clicks.  LUNGS: She has prolonged inspiratory and expiratory phase. Positive end expiratory wheezing. Negative use of accessory muscles. No dullness to percussion.  ABDOMEN: Soft, flat, nontender, nondistended. Has good bowel sounds. No hepatosplenomegaly appreciated.  EXTREMITIES: No evidence of any cyanosis or clubbing. She does have +1 to 2 pitting edema from the knees to the ankles bilaterally and +2 pedal and radial pulses bilaterally.  NEUROLOGIC: The patient is alert, awake and oriented x 3 with no focal motor or sensory deficits appreciated  bilaterally.  SKIN: Moist and warm with no rash appreciated.  LYMPHATIC: There is no cervical lymphadenopathy.   DIAGNOSTIC DATA:  Serum glucose is 321, BUN 9, creatinine 0.8, sodium 138, potassium 3.7, chloride 103 and bicarbonate 28. The patient's troponin is less than 0.02. White cell count 5.9, hemoglobin 14.1, hematocrit 41.0, platelet count 177. Chest x-ray shows no evidence of acute cardiopulmonary disease.   ASSESSMENT AND PLAN:  This is a 59 year old female with a history of obstructive sleep apnea, diabetes, hypertension, hyperlipidemia, history of asthma, gastroesophageal reflux disease, depression/anxiety who presents to the hospital with shortness of breath and wheezing.  1.  Asthma exacerbation. This is likely secondary to an acute bronchitis. We will start the patient on some intravenous steroids, continue albuterol nebulizer every 4 hours, continue her Advair and also give her some Zithromax. Check her ambulatory sats in the morning tomorrow.  2.  Diabetes. Continue Lantus and NovoLog with meals. We will check her hemoglobin A1c. Her sugars may be somewhat on the higher side given the fact that she is going to be on intravenous steroids.  3.  Hypertension. Continue lisinopril.  4.  Depression/anxiety. Continue with Celexa and Xanax.  5.  Gastroesophageal reflux disease. Continue Protonix.  6.  Hyperlipidemia. Continue Lipitor.  7.  Diabetic gastroparesis. Continue Reglan.  8.  Obstructive sleep apnea. Continue with her CPAP at bedtime.   CODE STATUS:  The patient is a full code.   TIME SPENT:  45 minutes.    ____________________________ Belia Heman. Verdell Carmine, MD vjs:si D: 06/09/2012 21:41:00 ET T: 06/09/2012 22:00:26 ET JOB#: [id number]  cc: Belia Heman. Verdell Carmine, MD, <Dictator> Henreitta Leber MD ELECTRONICALLY SIGNED 06/10/2012 14:17

## 2014-09-11 NOTE — H&P (Signed)
PATIENT NAME:  Andrea Pollard, PICAZO MR#:  [id number] DATE OF BIRTH:  09-07-1955  DATE OF ADMISSION:  04/24/2013  PRIMARY CARE PHYSICIAN: Dr. Kingsley Spittle at [facility]  REQUESTING PHYSICIAN:  Dr. Graciella Freer  CHIEF COMPLAINT: Abdominal pain, nausea, vomiting.   HISTORY OF PRESENT ILLNESS: The patient is a 59 year old female with a known history of hypertension, diabetes, diabetic gastroparesis, is being admitted for uncontrolled abdominal pain, nausea, vomiting. The patient started having pain in the right upper and lower quadrant since Monday, and could not control it at home. Started having nausea and vomiting today, and could not keep anything down. She vomited 2 times today, and since then she has been dry heaving, as she is not able to keep anything down. Came to the Emergency Department. While in the ED, she received a total of 4 mg of Dilaudid and 4 mg of morphine, so she received about 4 mg of IV morphine and 1 mg of IV Dilaudid, but was still in pain. She also received Phenergan 25 mg IV one time, but still has been having dry heaves, and her pain is 8 to 10 out of 10, and she is very uncomfortable. She denies any fever or any other symptoms at this time. She does not have any diarrhea.   PAST MEDICAL HISTORY: 1.  Diabetes.  2.  Hypertension.  3.  Hyperlipidemia. 4.  GERD. 5.  Diabetic gastroparesis. 6.  Anxiety/depression.   ALLERGIES: BACTRIM, LEVAQUIN, NITROGLYCERIN.   FAMILY HISTORY: Mother is healthy. Father died from complication of stroke.   SOCIAL HISTORY: Nonsmoker. No alcohol. No IV drugs of abuse.   MEDICATIONS AT HOME: 1.  Advair 500/50, 1 puff b.i.d.  2.  DuoNeb 2 puffs inhaled 4 times a day. 3.  Alprazolam 0.5 mg p.o. at bedtime.  4.  Aspirin 81 mg p.o. daily.  5.  Bupropion 75 mg p.o. b.i.d.  6.  Citalopram 40 mg p.o. at bedtime.  7.  Flovent 1 puff inhaled daily.  8.  Insulin Lantus 50 units subcu b.i.d.  9.  Lipitor 10 mg p.o. at bedtime.  10.   Lisinopril 5 mg p.o. daily.  11.  Reglan 10 mg p.o. 4 times a day.  12.  Multivitamin once daily.  13. NovoLog 40 units subcu twice a day.  14.  Protonix 40 mg p.o. daily. 15.  Proventil 2 puffs inhaled every 4 hours as needed.  16.  Zofran 4 mg every 6 hours as needed.   REVIEW OF SYSTEMS: CONSTITUTIONAL: No fever, fatigue, weakness. EYES:  No blurry or double vision.  ENT: No tinnitus or ear pain.  RESPIRATORY: No cough, wheezing, hemoptysis.  CARDIOVASCULAR: No chest pain, orthopnea, edema.  GASTROINTESTINAL: Positive for nausea, vomiting and abdominal pain, now having dry heaves.  GENITOURINARY: No dysuria or hematuria.  ENDOCRINE: No polyuria or nocturia.  HEMATOLOGIC: No anemia or easy bruising.  SKIN: No rash or lesion.  MUSCULOSKELETAL: No arthritis or muscle cramp.  NEUROLOGIC: No tingling, numbness, weakness.  PSYCHIATRIC: Positive for anxiety and depression. No ADD.   PHYSICAL EXAMINATION: VITAL SIGNS: Temperature 98.6, heart rate 99 per minute, respirations 20 per minute, blood pressure 137/88 mmHg, saturating 93% on room air.  GENERAL: The patient is a 59 year old female lying on the bed in some acute abdominal pain.  EYES: Pupils equal, round, react to light and accommodation. No scleral icterus. Extraocular muscles intact.  HEENT: Head atraumatic, normocephalic. Oropharynx and nasopharynx clear.  NECK:  Supple. No JVD, No thyroid enlargement or  tenderness. LUNGS:  Clear to auscultation bilaterally. No wheezing, rales, rhonchi or crepitation.  CARDIOVASCULAR: S1, S2 normal. No murmurs, gallops.  ABDOMEN: Soft. Has some tenderness present in the right lower quadrant. No guarding or rigidity. Morbidly obese. Difficult to evaluate organomegaly.  EXTREMITIES: No pedal edema, cyanosis, clubbing.  NEUROLOGIC: Cranial nerves II through XII intact. Muscle strength 5/5 in all extremities. Sensation intact.  PSYCHIATRIC: The patient is alert and oriented x 3.  SKIN: No obvious  rash, lesion, ulcer.   LABORATORY PANEL: Normal BMP. Normal liver function tests, except AST of 57. Normal first set of troponins. Normal CBC. Negative UA.   CT scan of the abdomen and pelvis in the ED showed fatty infiltration of the liver. Moderate stools in the right colon and cecum. Normal appendix. No small bowel obstruction. No hydronephrosis or hydroureter.   IMPRESSION AND PLAN:  1.  Uncontrolled abdominal pain, nausea and vomiting, likely secondary to gastroparesis flare from possible stomach virus. Will start her on IV Reglan and start her on Bentyl along with p.r.n. Dilaudid. Will consult GI and Surgery to make sure there is no underlying pathology. Even though her CT is negative, she could have appendicitis.  2.  Diabetic gastroparesis. For management as above with IV Reglan, Zofran, and symptomatic treatment.   3.  Hypertension. Will continue home medication. Blood pressure is stable.  4. Diabetes. Will hold her home diabetic medication, as she will be n.p.o. except medications now. If her symptoms are controlled, we can start clear liquid tomorrow and advance diet as tolerated. For now, will start her on sliding scale insulin.   5.  Code status:  FULL CODE.   Total time taking care of this patient was 55 minutes.        ____________________________ Lucina Mellow. Manuella Ghazi, MD vss:mr D: 04/24/2013 19:02:59 ET T: 04/24/2013 19:44:55 ET JOB#: [id number]  cc: Kyrielle Urbanski S. Manuella Ghazi, MD, <Dictator> [place] MD ELECTRONICALLY SIGNED 04/25/2013 18:15

## 2014-09-13 NOTE — Consult Note (Signed)
PATIENT NAME:  Andrea Pollard, Andrea Pollard MR#:  [id number] DATE OF BIRTH:  16-Sep-1955  DATE OF CONSULTATION:  05/31/2011  REFERRING PHYSICIAN:  Dr. Burt Knack CONSULTING PHYSICIAN:  Vivien Presto, MD  REASON FOR CONSULTATION: Diabetes management.   HISTORY OF PRESENT ILLNESS: Patient is a 59 year old obese, Caucasian female with history of diabetes, asthma, obstructive sleep apnea, hyperlipidemia, hypertension who is here status post incision and drainage of the neck yesterday. Patient has been admitted for incision and drainage by surgery and medicine was consulted for help with her diabetes. Patient initially had a neck abscess drained at her primary care physician's office and as it recurred she underwent admission and incision and drainage and drainage of blood 20 mL of purulent drainage yesterday. Last night she had high-grade fever and blood cultures have been one out of three positive for gram-positive cocci in clusters and wound cultures have come back with staph aureus. Patient currently is on Unasyn. Patient denies having any nausea, vomiting, abdominal pain. She has some posterior neck tenderness and the drains which were placed during surgery are still draining. Patient states that her sugars are usually well controlled, however, we do not have a hemoglobin A1c this admission, however, hemoglobin A1c in February of last year was 7.7.   PAST MEDICAL HISTORY:  1. Diabetes. 2. Asthma. 3. Obstructive sleep apnea on CPAP. 4. Hyperlipidemia. 5. Depression. 6. Hypertension.   ALLERGIES: Levaquin and Bactrim and nitroglycerin.   PAST SURGICAL HISTORY:  1. C-section. 2. Hysterectomy. 3. Cholecystectomy.    MEDICATIONS:  1. Lisinopril 5 mg daily.  2. Glipizide 10 mg b.i.d.  3. Metformin 1000 mg twice daily.  4. Advair 250/50 mcg twice a day. 5. Aspirin 81 mg daily.  6. Celexa 60 mg daily.  7. Lipitor 10 mg daily.   SOCIAL HISTORY: No tobacco, alcohol or drug use. Works at [employer] HISTORY: Positive for CVA. Brother with a heart defect.    REVIEW OF SYSTEMS: CONSTITUTIONAL: Patient has fevers, fatigue. EYES: No blurry vision, double vision. ENT: No sore throat, epistaxis or tinnitus. RESPIRATORY: No cough, wheezing. She has history of obstructive sleep apnea. Positive for asthma. CARDIOVASCULAR: No chest pain, orthopnea, or edema. No arrhythmia. GASTROINTESTINAL: No nausea, vomiting, diarrhea, or hematemesis. GENITOURINARY: No dysuria or renal calculus or frequency. ENDOCRINE: No polyuria, nocturia or thyroid problems. HEMATOLOGY: No anemia or easy bruising history. MUSCULOSKELETAL: Patient has neck pain. No arthritis. NEUROLOGICAL: No numbness, weakness. PSYCH: No anxiety. Patient does have depression.   PHYSICAL EXAMINATION:  VITAL SIGNS: Current temperature 100, T-max overnight was per notes 105, pulse 114, respiratory rate 20, blood pressure 101/64, oxygen saturation 93% on room air.   GENERAL: Patient is an obese, Caucasian female, no obvious distress, lying in bed.  HEENT: Normocephalic, atraumatic. Pupils are equal and reactive. Anicteric sclerae. Moist mucous membranes.   NECK: Supple. There is extensive area of erythema, tenderness and induration in the back of the neck that comes towards the front below the chin and jaw area. There are areas of some drainage.   CARDIOVASCULAR: S1, S2 tachycardic. No murmurs, rubs, or gallops.   LUNGS: Clear to auscultation.   ABDOMEN: Soft, nontender, nondistended. Hepatomegaly noted. Obese.   EXTREMITIES: No significant edema.   NEUROLOGICAL: Cranial nerves II through XII grossly intact. Strength 5/5 in all extremities. Sensation intact.   LABORATORY, DIAGNOSTIC AND RADIOLOGICAL DATA: Glucose on initial BMP 490, BUN 9, creatinine 0.95, sodium 136. LFTs: Albumin 2.7, AST 43, otherwise within normal limits. WBC 15.2, hemoglobin 13.1,  hematocrit 39.4. Wound cultures growing staph aureus. Blood cultures one out of three  growing gram-positive cocci in clusters. Wound culture repeat during surgery showing staph aureus.   ASSESSMENT AND PLAN: We have a 59 year old obese, Caucasian female with obstructive sleep apnea, diabetes, hyperlipidemia, hypertension, depression, status post incision and drainage of the neck with sepsis including fever, tachycardia and leukocytosis from neck abscess with cultures growing staph aureus and possible bacteremia, however, it is only one out of three bottles. At this point we would stop the ampicillin and start vancomycin with pharmacy dosing. Would repeat blood cultures as patient has positive blood cultures. It is possible that this is MRSA given the high-grade fevers and significant drainage. Furthermore the wound cultures are showing staph aureus. I would place the patient on contact isolation and check a MRSA screen and get infectious disease involved. The drains are still draining and we would monitor this along with surgery. I would hold the lisinopril for now given blood pressure on the lower side. In regards to her diabetes, I will check a hemoglobin A1c, however, when I do look at flow sheet it looks like the last sugars are 168, 156 and 209 and given the significant infection and possible bacteremia I would continue current therapy with sliding-scale insulin and check a BMP and if creatinine is going up I would consider holding the oral diabetes medications. I would continue CPAP for obstructive sleep apnea and check a CBC in the morning. I would continue the other medications for now including the inhalers.   TOTAL TIME SPENT: 60 minutes.   Thank you for the opportunity to see this patient and we would follow up with you.   ____________________________ Vivien Presto, MD sa:cms D: 05/31/2011 18:47:41 ET T: 06/01/2011 09:27:45 ET  JOB#: [id number] cc: Vivien Presto, MD, <Dictator> Vivien Presto MD ELECTRONICALLY SIGNED 06/02/2011 20:38

## 2014-09-13 NOTE — H&P (Signed)
PATIENT NAME:  Andrea Pollard, Andrea Pollard MR#:  [id number] DATE OF BIRTH:  March 04, 1956  DATE OF ADMISSION:  05/30/2011  CHIEF COMPLAINT: Neck abscess.   HISTORY OF PRESENT ILLNESS: This is a patient who is morbidly obese with diabetes who presents with a recurrent neck abscess. This started last week and has been steadily worsening. She had an I and D performed at her doctor's office which drained some but she has continued to worsen and her pain is considerably worse than it was before and she is having some fevers.   She has never had an episode like this before. Denies nausea or vomiting.   PAST MEDICAL HISTORY:  1. Diabetes.  2. Morbid obesity. 3. Asthma. 4. Probable hypertension. She is taking lisinopril but denies that she has hypertension.   PAST SURGICAL HISTORY:  1. Hysterectomy sparing her ovaries. 2. Gallbladder removal.   ALLERGIES: Levaquin and Bactrim.   MEDICATIONS: Multiple, see chart.   FAMILY HISTORY: Noncontributory.   SOCIAL HISTORY: She does not smoke or drink. Works in Therapist, art for [employer]: 10 system review has been performed and negative with the exception of that mentioned in the history of present illness.   PHYSICAL EXAMINATION:   GENERAL: Uncomfortable appearing Caucasian female patient. She is morbidly obese.  VITAL SIGNS: Temperature 98, pulse 98, respirations 18, blood pressure 134/73, 99% room air sat, pain scale 8.   HEENT: No scleral icterus.   NECK: There is considerable and marked inflammatory process with erythema, induration, and tenderness in the posterior neck essentially from one side to the other. There is an area to the right of midline which has been drained and it is draining purulent material.  CHEST: Clear to auscultation.   CARDIAC: Regular rate and rhythm.   ABDOMEN: Soft, nontender.   EXTREMITIES: Moderate edema in the lower extremities.   NEUROLOGIC: Grossly intact.  INTEGUMENTARY: No jaundice.    LABORATORY, DIAGNOSTIC, AND RADIOLOGICAL DATA: White blood cell count 15, hemoglobin and hematocrit of 13 and 39, and a platelet count of 219. Electrolytes are within normal limits with the exception of glucose of 490.   ASSESSMENT AND PLAN: This is a patient with a large abscess in the neck. She ate breakfast this morning, therefore, she will be admitted to the hospital and hydrated. IV antibiotics have already been started by Dr. Jimmye Norman in the ED. I will continue those and we will plan incision and drainage later today. The options of observation have been reviewed, the risk of bleeding, infection, recurrence, cosmetic deformity, and the potential for a large incision was discussed. She understood and agreed to proceed.   ____________________________ Jerrol Banana Burt Knack, MD rec:drc D: 05/30/2011 14:18:54 ET T: 05/30/2011 14:30:37 ET JOB#: [id number]  cc: Jerrol Banana. Burt Knack, MD, <Dictator> Florene Glen MD ELECTRONICALLY SIGNED 05/30/2011 17:22

## 2014-09-13 NOTE — H&P (Signed)
Subjective/Chief Complaint neck abscess    History of Present Illness recurrentabscess, drained last week worsening, pain fevers    Past History DM, morbid obesity, asthma PSH hyst, GB    Past Medical Health Diabetes Mellitus   Past Med/Surgical Hx:  Depression:   HTN:   Hyperlipidemia:   Gastric Reflux:   Diabetes Mellitus, Type II (NIDD):   Asthma:   Cholecystectomy:   Cesarean Section:   Hysterectomy - Partial:   ALLERGIES:  Levaquin: Hives  Bactrim: Hives  Nitroglycerin: Unknown  Family and Social History:   Family History Non-Contributory    Social History negative tobacco, negative ETOH, cust service, [employer]   Review of Systems:   Fever/Chills Yes    Cough No    Abdominal Pain No    Diarrhea No    Nausea/Vomiting No    SOB/DOE No    Chest Pain No    Dysuria No   Physical Exam:   GEN NAD, obese    HEENT pink conjunctivae, induration, erythema posterior neck , drainage/purulence    NECK masses present    RESP normal resp effort    CARD regular rate    ABD denies tenderness    LYMPH negative neck    EXTR positive edema    SKIN normal to palpation    PSYCH alert, A+O to time, place, person, good insight   Routine Hem:  08-Jan-13 10:41    WBC (CBC) 15.2   RBC (CBC) 4.54   Hemoglobin (CBC) 13.1   Hematocrit (CBC) 39.4   Platelet Count (CBC) 219   MCV 87   MCH 28.9   MCHC 33.3   RDW 13.4  Routine Chem:  08-Jan-13 10:41    Glucose, Serum 490   BUN 9   Creatinine (comp) 0.95   Sodium, Serum 136   Potassium, Serum 3.7   Chloride, Serum 97   CO2, Serum 30   Calcium (Total), Serum 9.0  Hepatic:  08-Jan-13 10:41    Bilirubin, Total 0.8   Alkaline Phosphatase 122   SGPT (ALT) 39   SGOT (AST) 43   Total Protein, Serum 7.4   Albumin, Serum 2.7  Routine Chem:  08-Jan-13 10:41    Osmolality (calc) 292   eGFR (African American) >60   eGFR (Non-African American) >60   Anion Gap 9     Assessment/Admission Diagnosis neck  abscess ate at 0900 rec I&D later today risks and options rev'd   Electronic Signatures: Florene Glen (MD)  (Signed 08-Jan-13 11:51)  Authored: CHIEF COMPLAINT and HISTORY, PAST MEDICAL/SURGIAL HISTORY, ALLERGIES, FAMILY AND SOCIAL HISTORY, REVIEW OF SYSTEMS, PHYSICAL EXAM, LABS, ASSESSMENT AND PLAN   Last Updated: 08-Jan-13 11:51 by Florene Glen (MD)

## 2014-09-13 NOTE — Consult Note (Signed)
Impression: 59yo WF w/ h/o DM admitted with Methacillin Resistant Staph aureus neck abscess, s/p I&D and GPC in blood cultures..  She continues to have drainage from the wound.  Fever curve improved.  WBC normal.  It is possible that the blood cultures are a contaminant.  If they actually grow Methacillin Resistant Staph aureus, she will need an echo.  Repeat BCx are negative.  If she has Methacillin Resistant Staph aureus bacteremia she will need two weeks of IV vancomycin. If her blood cultures do not grow Methacillin Resistant Staph aureus, will cChange vanco to doxycycline.   Would treat for 10 days if the blood cultures are a contaminant. . Continue contact isolation. No prior episodes.  No further ID work up needed.   Electronic Signatures: Keyira Mondesir, Heinz Knuckles (MD) (Signed on 10-Jan-13 12:37)  Authored   Last Updated: 10-Jan-13 12:47 by Ranetta Armacost, Heinz Knuckles (MD)

## 2014-09-13 NOTE — Consult Note (Signed)
PATIENT NAME:  Andrea Pollard, Andrea Pollard MR#:  [id number] DATE OF BIRTH:  1956-04-19  DATE OF CONSULTATION:  06/01/2011  REFERRING PHYSICIAN:  Vivek J. Verdell Carmine, MD  CONSULTING PHYSICIAN:  Heinz Knuckles. Lindalee Huizinga, MD  REASON FOR CONSULTATION: MRSA abscess in the neck.   HISTORY OF PRESENT ILLNESS: The patient is a 59 year old white female with a past history significant for diabetes who presented with an approximately eight-day history of an increasing pustular mass on the right posterior neck. The patient states that she began having pain and swelling and redness over the back of the neck approximately eight days prior to her admission. She was seen as an outpatient and the lesion was drained. She was given clindamycin orally, but on follow-up she continued to have fluctuans and was referred to the Emergency Room. She was admitted on January 8th for an abscess to the neck. She states she was having fevers and chills at home and generalized malaise. No cough, no shortness of breath, no sputum production. She was not having any other lesions other than the one on the posterior aspect of her neck. Her bowels and bladder were doing okay. On the day of admission, she underwent incision and drainage of two abscesses below the nuchal fold. A significant amount of purulent material was drained and sent for culture and subsequently grew MRSA. Drains were placed, and the patient states that since surgery she has been feeling better but still has some pain and drainage from the wound. She also has been having some continued fevers. She had been given vancomycin on admission initially and then Unasyn. On January 9th, presumably when her cultures came back, she was switched to vancomycin.   ALLERGIES: Bactrim and Levaquin, which causes rash, as well as nitroglycerin.   PAST MEDICAL HISTORY:  1. Diabetes. She states her sugars have been high in the last week prior to admission.  2. Asthma.  3. Chronic obstructive pulmonary  disease, on CPAP.  4. Hypercholesterolemia.  5. Hypertension.  6. Depression.   SOCIAL HISTORY: She does not smoke, nor does she drink.   FAMILY HISTORY: Positive for stroke and heart defect.   REVIEW OF SYSTEMS: GENERAL: Positive fevers, chills, sweats, malaise. HEENT: No headache. No sinus congestion. No sore throat. NECK: Positive mass in the posterior aspect of the neck with fluctuans and pain and swelling and redness. No swollen glands. RESPIRATORY: No cough, no shortness of breath. No sputum production. CARDIAC: No chest pains or palpitations. GI: No nausea, no vomiting, no abdominal pain, no change in her bowels. GENITOURINARY: No change in her urine. MUSCULOSKELETAL: She has had generalized malaise but no frank joint inflammation. NEUROLOGIC: No focal weakness. PSYCHIATRIC: No complaints. All other systems are negative.   PHYSICAL EXAMINATION:  VITAL SIGNS: T-max of 103.0, T-current of 99.2, pulse 92, blood pressure 109/64, 96% on room air.   GENERAL: Obese white female in no acute distress.   HEENT: Normocephalic, atraumatic. Pupils are equal, reactive to light. Extraocular motion is intact. Sclerae, conjunctivae, and lids are without evidence for emboli or petechiae. Oropharynx showed dentition and gums were fairly normal.   NECK: She had significant erythema of the anterior neck that extended up into the face. She had significant redundant skin anteriorly. Posteriorly, there was an area with ulceration and drains in place. The area was draining purulent material. There was some significant surrounding erythema. The area was mildly tender to palpation. The wounds appeared to be draining well.   CHEST: Clear to auscultation bilaterally with good  air movement, no focal consolidation.   CARDIAC: Regular rate and rhythm without murmur, rub, or gallop.   ABDOMEN: Obese, soft, nontender, nondistended. No hepatosplenomegaly. No hernia noted.   EXTREMITIES: No evidence for tenosynovitis.    SKIN: She had no other lesions other than the posterior neck lesion.   NEUROLOGIC: The patient was awake and interactive, moving all four extremities.   PSYCHIATRIC: Mood and affect appeared normal.    LABORATORY, DIAGNOSTIC AND RADIOLOGICAL DATA:  BUN 7, creatinine 0.54, white count 8.0, hemoglobin 10.6, platelet count of 193, ANC of 5.5. White count on admission was 15.2.  Blood cultures January 8th are growing one of six bottles positive for gram-positive cocci in clusters.  Wound cultures from the neck are growing MRSA.  Repeat blood cultures from January 9th are negative.   IMPRESSION: A 59 year old white female with a history of diabetes, admitted with MRSA neck abscess, status post I and D, and gram-positive cocci in blood cultures.   RECOMMENDATIONS:  1. She continues to have drainage from the wound. Her fever curve has improved, and her white blood count is normal. It is possible that the gram-positive cocci are a contaminant. If they actually grow MRSA, she will need a transthoracic echocardiogram. Repeat cultures from the next day are already negative. If she has MRSA bacteremia, she will need two weeks of IV vancomycin.  2. If her blood cultures do not grow MRSA, we will change the vancomycin to doxycycline.  3. I would plan on treating for 10 days if the blood cultures are a contaminant.  4. I would continue contact isolation.  5. She has had no prior episodes. No further ID work-up is needed.   Thank you very much for involving me in Ms. Goodgame's care.  ____________________________ Heinz Knuckles. Paeton Studer, MD meb:cbb D: 06/01/2011 22:48:25 ET T: 06/01/2011 13:25:32 ET JOB#: [id number]  cc: Heinz Knuckles. Ijanae Macapagal, MD, <Dictator> Avedis Bevis E Maria Gallicchio MD ELECTRONICALLY SIGNED 06/07/2011 9:34

## 2014-09-13 NOTE — Op Note (Signed)
PATIENT NAME:  Andrea Pollard, Andrea Pollard MR#:  [id number] DATE OF BIRTH:  1956-04-17  DATE OF PROCEDURE:  05/30/2011  PREOPERATIVE DIAGNOSIS: Neck abscess.   POSTOPERATIVE DIAGNOSIS: Neck abscess.   PROCEDURE PERFORMED: Incision and drainage neck abscess.  SURGEON: Rodena Goldmann III, MD  ANESTHESIA: Monitored anesthetic care.   DESCRIPTION OF PROCEDURE: With the patient in the right side down position she was appropriately padded and positioned while awake. She was then sedated and the neck abscess area prepped with Betadine, draped with sterile towels. Cultures were taken. Abscess and both nuchal fold was then opened, drained widely and a counterincision created. A drain was placed through the incision. There appeared to be a second abscess below the nuchal fold which is likewise opened. It was drained of about 20 mL of purulent material and did not appear to communicate with the abscess above at least in a broad sense. A separate drain was placed. Both drains were sutured using 3-0 nylon. Sterile dressings were applied. The patient was awakened and returned to the recovery room having tolerated the procedure well. Sponge, instrument, and needle counts were correct x2 in the Operating Room.   ____________________________ Rodena Goldmann III, MD rle:cms D: 05/30/2011 20:37:32 ET T: 05/31/2011 08:13:50 ET JOB#: [id number]  cc: Rodena Goldmann III, MD, <Dictator> Rodena Goldmann MD ELECTRONICALLY SIGNED 05/31/2011 22:57

## 2014-09-13 NOTE — Discharge Summary (Signed)
PATIENT NAME:  Andrea Pollard, Andrea Pollard MR#:  [id number] DATE OF BIRTH:  1955-10-24  DATE OF ADMISSION:  05/30/2011 DATE OF DISCHARGE:  06/03/2011  BRIEF HISTORY: Andrea Pollard is a 59 year old woman admitted to the surgical service with a large abscess on the back portion of her neck. The abscess has been treated as an outpatient in the acute care center without success, and she continued to have significant increase in symptoms. She presented to the emergency room with a large fluctuant abscess, and surgical intervention was recommended. After appropriate preoperative preparation and informed consent, she was taken to surgery on the evening of 05/30/2011. She underwent an incision and drainage procedure. She has severe diabetes, and the internal medicine service assisted in the management of her diabetic problems. She continued to improve over the next several days. She was seen by the ID service who assisted in the management of her antibiotic therapy. She was discharged home on 06/03/2011 to follow up in the office in 7 to 10 days' time for drain removal. She is discharged home on lisinopril 5 mg p.o. daily, aspirin 81 mg p.o. daily, metformin 500 mg 2 tablets b.i.d., glipizide 10 mg b.i.d., Celexa 60 mg at bedtime, Lipitor 10 mg p.o. daily, doxycycline 100 mg p.o. b.i.d., and Norco 05/325 q.4-6 hours p.r.n.   FINAL DISCHARGE DIAGNOSIS: Nuchal abscess.     SURGERY: Incision and drainage.     ____________________________ Rodena Goldmann III, MD rle:vtd D: 06/10/2011 12:01:37 ET T: 06/10/2011 13:45:11 ET JOB#: [id number]  cc: Rodena Goldmann III, MD, <Dictator> Ellamae Sia, MD Heinz Knuckles. Blocker, MD Rodena Goldmann MD ELECTRONICALLY SIGNED 06/14/2011 7:56

## 2014-10-25 ENCOUNTER — Emergency Department: Payer: 59

## 2014-10-25 ENCOUNTER — Emergency Department
Admission: EM | Admit: 2014-10-25 | Discharge: 2014-10-25 | Disposition: A | Discharge status: Home / Self Care | Payer: 59 | Attending: Emergency Medicine | Admitting: Emergency Medicine

## 2014-10-25 DIAGNOSIS — X58XXXA Exposure to other specified factors, initial encounter: Secondary | ICD-10-CM | POA: Insufficient documentation

## 2014-10-25 DIAGNOSIS — Y9289 Other specified places as the place of occurrence of the external cause: Secondary | ICD-10-CM | POA: Insufficient documentation

## 2014-10-25 DIAGNOSIS — S46912A Strain of unspecified muscle, fascia and tendon at shoulder and upper arm level, left arm, initial encounter: Secondary | ICD-10-CM

## 2014-10-25 DIAGNOSIS — Y998 Other external cause status: Secondary | ICD-10-CM | POA: Insufficient documentation

## 2014-10-25 DIAGNOSIS — Y9389 Activity, other specified: Secondary | ICD-10-CM | POA: Insufficient documentation

## 2014-10-25 DIAGNOSIS — S46812A Strain of other muscles, fascia and tendons at shoulder and upper arm level, left arm, initial encounter: Secondary | ICD-10-CM | POA: Diagnosis not present

## 2014-10-25 DIAGNOSIS — S4992XA Unspecified injury of left shoulder and upper arm, initial encounter: Secondary | ICD-10-CM | POA: Diagnosis present

## 2014-10-25 MED ORDER — CYCLOBENZAPRINE HCL 10 MG PO TABS
5.0000 mg | ORAL_TABLET | Freq: Once | ORAL | Status: AC
  Administered 2014-10-25: 5 mg via ORAL

## 2014-10-25 MED ORDER — HYDROCODONE-ACETAMINOPHEN 5-325 MG PO TABS: 1.0000 | ORAL_TABLET | ORAL | Status: DC | PRN

## 2014-10-25 MED ORDER — CYCLOBENZAPRINE HCL 5 MG PO TABS: 5.0000 mg | ORAL_TABLET | Freq: Three times a day (TID) | ORAL | Status: AC | PRN

## 2014-10-25 MED ORDER — HYDROCODONE-ACETAMINOPHEN 5-325 MG PO TABS
ORAL_TABLET | ORAL | Status: AC
Start: 2014-10-25 — End: 2014-10-25
  Administered 2014-10-25: 1 via ORAL
  Filled 2014-10-25: qty 1

## 2014-10-25 MED ORDER — HYDROCODONE-ACETAMINOPHEN 5-325 MG PO TABS
1.0000 | ORAL_TABLET | Freq: Once | ORAL | Status: AC
  Administered 2014-10-25: 1 via ORAL

## 2014-10-25 MED ORDER — CYCLOBENZAPRINE HCL 10 MG PO TABS
ORAL_TABLET | ORAL | Status: AC
  Administered 2014-10-25: 5 mg via ORAL
  Filled 2014-10-25: qty 1

## 2014-10-25 NOTE — ED Provider Notes (Signed)
CSN: [id number]     Arrival date & time 10/25/14  1138 History   First MD Initiated Contact with Patient 10/25/14 1210     Chief Complaint  Patient presents with  . Shoulder Injury     (Consider location/radiation/quality/duration/timing/severity/associated sxs/prior Treatment) HPI Patient is here with a complaint of left shoulder playing yesterday when she reached up aggressively to catch a bouquet started having pain in her left shoulder today developed the point where she cannot lift her arm denies numbness tingling weakness of the extremity she has no radiation of the the pain she has no chest pain or shortness of breath did not fall on it and denies previous injury rates pain as about a 10 out of 10 with certain movements relieved by holding her arm up against her body and not moving it no other symptoms of note at this time History reviewed. No pertinent past medical history. Past Surgical History  Procedure Laterality Date  . Laparoscopic gastric sleeve resection     No family history on file. History  Substance Use Topics  . Smoking status: Never Smoker   . Smokeless tobacco: Not on file  . Alcohol Use: No   OB History    No data available     Review of Systems  Constitutional: Negative.   HENT: Negative.   Eyes: Negative.   Respiratory: Negative.   Gastrointestinal: Negative.   Skin: Negative.   Neurological: Negative.   All other systems reviewed and are negative.     Allergies  Bactrim; Ibuprofen; Levaquin; and Nitroglycerin  Home Medications   Prior to Admission medications   Medication Sig Start Date End Date Taking? Authorizing Provider  cyclobenzaprine (FLEXERIL) 5 MG tablet Take 1 tablet (5 mg total) by mouth every 8 (eight) hours as needed for muscle spasms. 10/25/14 10/25/15  Lillith Mcneff William C Depaul Arizpe, PA-C  HYDROcodone-acetaminophen (NORCO/VICODIN) 5-325 MG per tablet Take 1 tablet by mouth every 4 (four) hours as needed for moderate pain. 10/25/14   Saud Bail  William C Ainhoa Rallo, PA-C   BP 135/76 mmHg  Pulse 75  Temp(Src) 98.2 F (36.8 C) (Oral)  Resp 16  Ht 5\' 5"  (1.651 m)  Wt 210 lb (95.255 kg)  BMI 34.95 kg/m2  SpO2 100% Physical Exam Caucasian female appearing stated age well-developed well-nourished no acute distress Vitals reviewed Head ears eyes nose neck and throat exam unremarkable Cardiovascular regular rate and rhythm no murmurs rubs gallops Pulmonary lungs clear to auscultation bilaterally Skin is free of rash or disease Neuro exams nonfocal cranial nerves II through XII grossly intact good sensation throughout the distal extremity Muscular chest pain with palpation over her deltoid particularly at the insertion no crepitus of the shoulder full range of motion passively with pain limited range of motion with abduction actively due to pain ED Course  Procedures patient placed in shoulder sling by tech Labs Review Labs Reviewed - No data to display  Imaging Review Dg Shoulder Left  10/25/2014   CLINICAL DATA:  Reaching injury last evening. Persistent left shoulder pain.  EXAM: LEFT SHOULDER - 2+ VIEW  COMPARISON:  None.  FINDINGS: There are moderate AC joint degenerative changes and mild glenohumeral joint degenerative changes. No acute bony findings or abnormal soft tissue calcifications. The visualized left lung is clear.  IMPRESSION: Mild degenerative changes but no acute bony findings.   Electronically Signed   By: Marijo Sanes M.D.   On: 10/25/2014 13:07     EKG Interpretation None  MDM  The patient who injured her left shoulder while catching a bouquet yesterday she didn't fill a crack pop and tear but is now having serious pain with any type of attempt to raise her arm x-rays are negative patient be placed in a sling given pain management medications ice on the shoulder and follow-up with orthopedics as soon as possible next week for evaluation of questionable rotator cuff injury versus deltoid strain Final  diagnoses:  Shoulder strain, left, initial encounter  Strain of deltoid muscle, left, initial encounter        Ashantae Pangallo Verdene Rio, PA-C 10/25/14 1331  Lavonia Drafts, MD 10/25/14 [phone]

## 2014-10-25 NOTE — ED Notes (Signed)
Pt injured left shoulder yesterday at wedding after catching bouquet. Unable to move left shoulder. Pain after injury last night.

## 2015-01-05 ENCOUNTER — Emergency Department
Admission: EM | Admit: 2015-01-05 | Discharge: 2015-01-05 | Disposition: A | Discharge status: Home / Self Care | Payer: 59 | Attending: Emergency Medicine | Admitting: Emergency Medicine

## 2015-01-05 ENCOUNTER — Other Ambulatory Visit: Payer: Self-pay

## 2015-01-05 DIAGNOSIS — E119 Type 2 diabetes mellitus without complications: Secondary | ICD-10-CM | POA: Diagnosis not present

## 2015-01-05 DIAGNOSIS — I10 Essential (primary) hypertension: Secondary | ICD-10-CM | POA: Diagnosis not present

## 2015-01-05 DIAGNOSIS — E876 Hypokalemia: Secondary | ICD-10-CM | POA: Diagnosis not present

## 2015-01-05 DIAGNOSIS — H5500 Unspecified nystagmus: Secondary | ICD-10-CM | POA: Diagnosis not present

## 2015-01-05 DIAGNOSIS — R42 Dizziness and giddiness: Secondary | ICD-10-CM | POA: Diagnosis present

## 2015-01-05 HISTORY — DX: Essential (primary) hypertension: I10

## 2015-01-05 HISTORY — DX: Type 2 diabetes mellitus without complications: E11.9

## 2015-01-05 LAB — BASIC METABOLIC PANEL
Anion gap: 8 (ref 5–15)
BUN: 22 mg/dL — ABNORMAL HIGH (ref 6–20)
CO2: 31 mmol/L (ref 22–32)
Calcium: 9.6 mg/dL (ref 8.9–10.3)
Chloride: 103 mmol/L (ref 101–111)
Creatinine, Ser: 0.68 mg/dL (ref 0.44–1.00)
GFR calc non Af Amer: >60 mL/min (ref >60)
GLUCOSE: 92 mg/dL (ref 65–99)
POTASSIUM: 2.9 mmol/L — AB (ref 3.5–5.1)
Sodium: 142 mmol/L (ref 135–145)

## 2015-01-05 LAB — CBC
HEMATOCRIT: 42.8 % (ref 35.0–47.0)
HEMOGLOBIN: 14.5 g/dL (ref 12.0–16.0)
MCH: 28.3 pg (ref 26.0–34.0)
MCHC: 33.9 g/dL (ref 32.0–36.0)
MCV: 83.5 fL (ref 80.0–100.0)
Platelets: 253 10*3/uL (ref 150–440)
RBC: 5.13 MIL/uL (ref 3.80–5.20)
RDW: 14.3 % (ref 11.5–14.5)
WBC: 10.2 10*3/uL (ref 3.6–11.0)

## 2015-01-05 LAB — TROPONIN I

## 2015-01-05 MED ORDER — POTASSIUM CHLORIDE CRYS ER 20 MEQ PO TBCR
20.0000 meq | EXTENDED_RELEASE_TABLET | Freq: Once | ORAL | Status: AC
  Administered 2015-01-05: 20 meq via ORAL
  Filled 2015-01-05: qty 1

## 2015-01-05 MED ORDER — MECLIZINE HCL 25 MG PO TABS
25.0000 mg | ORAL_TABLET | Freq: Once | ORAL | Status: AC
  Administered 2015-01-05: 25 mg via ORAL
  Filled 2015-01-05: qty 1

## 2015-01-05 MED ORDER — ONDANSETRON HCL 4 MG/2ML IJ SOLN
4.0000 mg | Freq: Once | INTRAMUSCULAR | Status: DC
  Filled 2015-01-05: qty 2

## 2015-01-05 MED ORDER — MECLIZINE HCL 25 MG PO TABS: 25.0000 mg | ORAL_TABLET | Freq: Once | ORAL | Status: DC

## 2015-01-05 MED ORDER — MECLIZINE HCL 12.5 MG PO TABS: 12.5000 mg | ORAL_TABLET | Freq: Three times a day (TID) | ORAL | Status: DC | PRN

## 2015-01-05 MED ORDER — SODIUM CHLORIDE 0.9 % IV BOLUS (SEPSIS): 500.0000 mL | Freq: Once | INTRAVENOUS | Status: DC

## 2015-01-05 MED ORDER — ONDANSETRON HCL 4 MG PO TABS: 4.0000 mg | ORAL_TABLET | Freq: Three times a day (TID) | ORAL | Status: DC | PRN

## 2015-01-05 NOTE — ED Notes (Signed)
MD d/c'd IV and fluid orders. Will DC patient to home.

## 2015-01-05 NOTE — ED Notes (Addendum)
Pt c/o sudden onset dizziness with N/V and chest discomfort while at work around 10am..pt came in via EMS..states they gave her an IM injection for nausea but does not know what it was.

## 2015-01-05 NOTE — Discharge Instructions (Signed)
Benign Positional Vertigo Vertigo means you feel like you or your surroundings are moving when they are not. Benign positional vertigo is the most common form of vertigo. Benign means that the cause of your condition is not serious. Benign positional vertigo is more common in older adults. CAUSES  Benign positional vertigo is the result of an upset in the labyrinth system. This is an area in the middle ear that helps control your balance. This may be caused by a viral infection, head injury, or repetitive motion. However, often no specific cause is found. SYMPTOMS  Symptoms of benign positional vertigo occur when you move your head or eyes in different directions. Some of the symptoms may include:  Loss of balance and falls.  Vomiting.  Blurred vision.  Dizziness.  Nausea.  Involuntary eye movements (nystagmus). DIAGNOSIS  Benign positional vertigo is usually diagnosed by physical exam. If the specific cause of your benign positional vertigo is unknown, your caregiver may perform imaging tests, such as magnetic resonance imaging (MRI) or computed tomography (CT). TREATMENT  Your caregiver may recommend movements or procedures to correct the benign positional vertigo. Medicines such as meclizine, benzodiazepines, and medicines for nausea may be used to treat your symptoms. In rare cases, if your symptoms are caused by certain conditions that affect the inner ear, you may need surgery. HOME CARE INSTRUCTIONS   Follow your caregiver's instructions.  Move slowly. Do not make sudden body or head movements.  Avoid driving.  Avoid operating heavy machinery.  Avoid performing any tasks that would be dangerous to you or others during a vertigo episode.  Drink enough fluids to keep your urine clear or pale yellow. SEEK IMMEDIATE MEDICAL CARE IF:   You develop problems with walking, weakness, numbness, or using your arms, hands, or legs.  You have difficulty speaking.  You develop  severe headaches.  Your nausea or vomiting continues or gets worse.  You develop visual changes.  Your family or friends notice any behavioral changes.  Your condition gets worse.  You have a fever.  You develop a stiff neck or sensitivity to light. MAKE SURE YOU:   Understand these instructions.  Will watch your condition.  Will get help right away if you are not doing well or get worse. Document Released: 02/13/2006 Document Revised: 07/31/2011 Document Reviewed: 01/26/2011 [facility] Patient Information 2015 [place], [place]. This information is not intended to replace advice given to you by your health care provider. Make sure you discuss any questions you have with your health care provider. Please return immediately if condition worsens. Please contact her primary physician or the physician you were given for referral. If you have any specialist physicians involved in her treatment and plan please also contact them. Thank you for using [facility].

## 2015-01-05 NOTE — ED Provider Notes (Signed)
Time Seen: Approximately ----------------------------------------- 3:20 PM on 01/05/2015 -----------------------------------------    I have reviewed the triage notes  Chief Complaint: Dizziness   History of Present Illness: Andrea Pollard is a 59 y.o. female who presents with acute onset of "dizziness"". Patient states she was going around a corner and suddenly turned and started feeling a "" spinning sensation. She states she got into the elevator and started to get anxious and had some tunnel vision and slowly gradually to the floor. Her symptoms seem to be more consistent with vertigo and that she feels off balance and somewhat with the room spinning. She's had some nausea with dry heaves. She had a brief episode of chest pain in the lobby today associated with some dry heaves. She denies any visual disturbances, ear pain, weakness. She denies any chest pain at present, no shortness of breath or diaphoresis. She denies any abdominal pain. Patient's on diuretic therapy and has been notified by the lab to have a low potassium.   Past Medical History  Diagnosis Date  . Hypertension   . Diabetes mellitus without complication     There are no active problems to display for this patient.   Past Surgical History  Procedure Laterality Date  . Laparoscopic gastric sleeve resection    . Cesarean section    . Cholecystectomy    . Abdominal hysterectomy      Past Surgical History  Procedure Laterality Date  . Laparoscopic gastric sleeve resection    . Cesarean section    . Cholecystectomy    . Abdominal hysterectomy      Current Outpatient Rx  Name  Route  Sig  Dispense  Refill  . cyclobenzaprine (FLEXERIL) 5 MG tablet   Oral   Take 1 tablet (5 mg total) by mouth every 8 (eight) hours as needed for muscle spasms.   30 tablet   1   . HYDROcodone-acetaminophen (NORCO/VICODIN) 5-325 MG per tablet   Oral   Take 1 tablet by mouth every 4 (four) hours as needed for moderate  pain.   16 tablet   0     Allergies:  Bactrim; Ibuprofen; Levaquin; and Nitroglycerin  Family History: No family history on file.  Social History: Social History  Substance Use Topics  . Smoking status: Never Smoker   . Smokeless tobacco: None  . Alcohol Use: No     Review of Systems:   10 point review of systems was performed and was otherwise negative:  Constitutional: No fever Eyes: No visual disturbances ENT: No sore throat, ear pain Cardiac: No chest pain Respiratory: No shortness of breath, wheezing, or stridor Abdomen: No abdominal pain, no vomiting, No diarrhea Endocrine: No weight loss, No night sweats Extremities: No peripheral edema, cyanosis Skin: No rashes, easy bruising Neurologic: No focal weakness, trouble with speech or swollowing Urologic: No dysuria, Hematuria, or urinary frequency   Physical Exam:  ED Triage Vitals  Enc Vitals Group     BP 01/05/15 1200 118/71 mmHg     Pulse Rate 01/05/15 1200 66     Resp 01/05/15 1200 18     Temp 01/05/15 1200 98 F (36.7 C)     Temp Source 01/05/15 1200 Oral     SpO2 01/05/15 1200 99 %     Weight 01/05/15 1200 199 lb (90.266 kg)     Height 01/05/15 1200 5\' 4"  (1.626 m)     Head Cir --      Peak Flow --  Pain Score 01/05/15 1201 5     Pain Loc --      Pain Edu? --      Excl. in [place]? --     General: Awake , Alert , and Oriented times 3; GCS 15 Head: Normal cephalic , atraumatic Eyes: Pupils equal , round, reactive to light Nose/Throat: No nasal drainage, patent upper airway without erythema or exudate.  Neck: Supple, Full range of motion, No anterior adenopathy or palpable thyroid masses Lungs: Clear to ascultation without wheezes , rhonchi, or rales Heart: Regular rate, regular rhythm without murmurs , gallops , or rubs Abdomen: Soft, non tender without rebound, guarding , or rigidity; bowel sounds positive and symmetric in all 4 quadrants. No organomegaly .        Extremities: 2 plus  symmetric pulses. No edema, clubbing or cyanosis Neurologic: Positive Apley test with left lateral nystagmus which is exhaustive normal ambulation, Motor symmetric without deficits, sensory intact Skin: warm, dry, no rashes   Labs:   All laboratory work was reviewed including any pertinent negatives or positives listed below:  Labs Reviewed  BASIC METABOLIC PANEL  CBC  TROPONIN I    EKG:  ED ECG REPORT I, Daymon Larsen, the attending physician, personally viewed and interpreted this ECG.  Date: 01/05/2015 EKG Time: 1203 Rate: 64 Rhythm: normal sinus rhythm QRS Axis: normal Intervals: normal ST/T Wave abnormalities: normal Conduction Disutrbances: none Narrative Interpretation: unremarkable Occasional PVCs. No acute ischemic changes   Procedures: None   Critical Care: None    ED Course: Differential for her dizziness is extensive. Prickly basically breaks down to near syncope versus vertigo. Given the patient's current clinical presentation and objective findings I felt this was benign positional vertigo. She has no focal deficits on physical exam and her vertiginous symptoms which are worse when you turn her head to the left during Apley maneuvers he is exhaustive. She felt symptomatically improved with Antivert and Zofran. She will be discharged on the same. I felt this was unlikely to be central based vertigo. Patient was advised to return here if she has any other new symptoms and was advised to follow-up with her primary physician. She was discharged home with vertigo instructions. She also has a low potassium which advised her to eat foods that had more potassium in them and she was given 20 mEq tablet here in emergency department. Hypokalemia likely secondary to her hydrochlorothiazide.    Assessment:  Vertigo Mild hypokalemia   Final Clinical Impression: Peripheral vertigo Final diagnoses:  None      Plan: Patient was advised to return immediately if  condition worsens. Patient was advised to follow up with her primary care physician or other specialized physicians involved and in their current assessment.             Daymon Larsen, MD 01/05/15 [phone]

## 2015-01-05 NOTE — ED Notes (Signed)
Pt states sudden onset of dizziness this AM in the elevator, pt states nausea and dry heaves, pt states chest pain in the lobby, pt states she "feels swimmy headed". Pt awake and alert in no distess

## 2015-11-28 ENCOUNTER — Emergency Department
Admission: EM | Admit: 2015-11-28 | Discharge: 2015-11-28 | Disposition: A | Discharge status: Home / Self Care | Payer: 59 | Attending: Emergency Medicine | Admitting: Emergency Medicine

## 2015-11-28 ENCOUNTER — Emergency Department: Payer: 59

## 2015-11-28 ENCOUNTER — Encounter: Payer: Self-pay | Admitting: Emergency Medicine

## 2015-11-28 DIAGNOSIS — M25561 Pain in right knee: Secondary | ICD-10-CM | POA: Insufficient documentation

## 2015-11-28 DIAGNOSIS — Z79899 Other long term (current) drug therapy: Secondary | ICD-10-CM | POA: Diagnosis not present

## 2015-11-28 DIAGNOSIS — R52 Pain, unspecified: Secondary | ICD-10-CM

## 2015-11-28 DIAGNOSIS — E119 Type 2 diabetes mellitus without complications: Secondary | ICD-10-CM | POA: Insufficient documentation

## 2015-11-28 DIAGNOSIS — R609 Edema, unspecified: Secondary | ICD-10-CM

## 2015-11-28 DIAGNOSIS — I10 Essential (primary) hypertension: Secondary | ICD-10-CM | POA: Diagnosis not present

## 2015-11-28 MED ORDER — TRAMADOL HCL 50 MG PO TABS: 50.0000 mg | ORAL_TABLET | Freq: Four times a day (QID) | ORAL | Status: DC | PRN

## 2015-11-28 MED ORDER — TRAMADOL HCL 50 MG PO TABS
50.0000 mg | ORAL_TABLET | Freq: Once | ORAL | Status: AC
  Administered 2015-11-28: 50 mg via ORAL
  Filled 2015-11-28: qty 1

## 2015-11-28 NOTE — Discharge Instructions (Signed)
Joint Pain Joint pain, which is also called arthralgia, can be caused by many things. Joint pain often goes away when you follow your health care provider's instructions for relieving pain at home. However, joint pain can also be caused by conditions that require further treatment. Common causes of joint pain include:  Bruising in the area of the joint.  Overuse of the joint.  Wear and tear on the joints that occur with aging (osteoarthritis).  Various other forms of arthritis.  A buildup of a crystal form of uric acid in the joint (gout).  Infections of the joint (septic arthritis) or of the bone (osteomyelitis). Your health care provider may recommend medicine to help with the pain. If your joint pain continues, additional tests may be needed to diagnose your condition. HOME CARE INSTRUCTIONS Watch your condition for any changes. Follow these instructions as directed to lessen the pain that you are feeling.  Take medicines only as directed by your health care provider.  Rest the affected area for as long as your health care provider says that you should. If directed to do so, raise the painful joint above the level of your heart while you are sitting or lying down.  Do not do things that cause or worsen pain.  If directed, apply ice to the painful area:  Put ice in a plastic bag.  Place a towel between your skin and the bag.  Leave the ice on for 20 minutes, 2-3 times per day.  Wear an elastic bandage, splint, or sling as directed by your health care provider. Loosen the elastic bandage or splint if your fingers or toes become numb and tingle, or if they turn cold and blue.  Begin exercising or stretching the affected area as directed by your health care provider. Ask your health care provider what types of exercise are safe for you.  Keep all follow-up visits as directed by your health care provider. This is important. SEEK MEDICAL CARE IF:  Your pain increases, and medicine  does not help.  Your joint pain does not improve within 3 days.  You have increased bruising or swelling.  You have a fever.  You lose 10 lb (4.5 kg) or more without trying. SEEK IMMEDIATE MEDICAL CARE IF:  You are not able to move the joint.  Your fingers or toes become numb or they turn cold and blue.   This information is not intended to replace advice given to you by your health care provider. Make sure you discuss any questions you have with your health care provider.   Document Released: 05/08/2005 Document Revised: 05/29/2014 Document Reviewed: 02/17/2014 [employer]vier Interactive Patient Education [employer].  Please return immediately if condition worsens. Please contact her primary physician or the physician you were given for referral. If you have any specialist physicians involved in her treatment and plan please also contact them. Thank you for using [facility]. Thoughts on the causes of the right knee pain may be bursitis versus meniscus injury. Please contact her primary physician for further outpatient follow-up

## 2015-11-28 NOTE — ED Notes (Signed)
Pt resting in bed, resp even and unlabored, pt in no acute distress 

## 2015-11-28 NOTE — ED Provider Notes (Signed)
Time Seen: Approximately 12:30 I have reviewed the triage notes  Chief Complaint: Leg Pain   History of Present Illness: Andrea Pollard is a 60 y.o. female *who presents with some nontraumatic right knee pain. Patient states she was ambulating a fair amount yesterday outside and did not notice any knee pain or obvious injury. States she woke up this morning with discomfort mainly on the posterior surface of her right knee. She denies any significant swelling in her leg to this historian. She denies any hip or ankle discomfort. Patient was seen briefly at an urgent care center and was referred here for the possibility of a deep venous thrombosis.    Past Medical History  Diagnosis Date  . Hypertension   . Diabetes mellitus without complication ([place])     There are no active problems to display for this patient.   Past Surgical History  Procedure Laterality Date  . Laparoscopic gastric sleeve resection    . Cesarean section    . Cholecystectomy    . Abdominal hysterectomy      Past Surgical History  Procedure Laterality Date  . Laparoscopic gastric sleeve resection    . Cesarean section    . Cholecystectomy    . Abdominal hysterectomy      Current Outpatient Rx  Name  Route  Sig  Dispense  Refill  . HYDROcodone-acetaminophen (NORCO/VICODIN) 5-325 MG per tablet   Oral   Take 1 tablet by mouth every 4 (four) hours as needed for moderate pain.   16 tablet   0   . meclizine (ANTIVERT) 12.5 MG tablet   Oral   Take 1 tablet (12.5 mg total) by mouth 3 (three) times daily as needed for dizziness or nausea.   30 tablet   1   . ondansetron (ZOFRAN) 4 MG tablet   Oral   Take 1 tablet (4 mg total) by mouth every 8 (eight) hours as needed for nausea or vomiting.   21 tablet   0   . traMADol (ULTRAM) 50 MG tablet   Oral   Take 1 tablet (50 mg total) by mouth every 6 (six) hours as needed.   30 tablet   0     Allergies:  Bactrim; Ibuprofen; Levaquin; and  Nitroglycerin  Family History: History reviewed. No pertinent family history.  Social History: Social History  Substance Use Topics  . Smoking status: Never Smoker   . Smokeless tobacco: None  . Alcohol Use: No     Review of Systems:   10 point review of systems was performed and was otherwise negative:  Constitutional: No fever Eyes: No visual disturbances ENT: No sore throat, ear pain Cardiac: No chest pain Respiratory: No shortness of breath, wheezing, or stridor Abdomen: No abdominal pain, no vomiting, No diarrhea Endocrine: No weight loss, No night sweats Extremities: No peripheral edema, cyanosis Skin: No rashes, easy bruising Neurologic: No focal weakness, trouble with speech or swollowing Urologic: No dysuria, Hematuria, or urinary frequency   Physical Exam:  ED Triage Vitals  Enc Vitals Group     BP 11/28/15 0952 128/90 mmHg     Pulse Rate 11/28/15 0952 82     Resp 11/28/15 0952 16     Temp 11/28/15 0952 98 F (36.7 C)     Temp Source 11/28/15 0952 Oral     SpO2 11/28/15 0952 96 %     Weight 11/28/15 0952 209 lb (94.802 kg)     Height 11/28/15 0952 5\' 5"  (1.651 m)  Head Cir --      Peak Flow --      Pain Score 11/28/15 0953 8     Pain Loc --      Pain Edu? --      Excl. in [place]? --     General: Awake , Alert , and Oriented times 3; GCS 15 Head: Normal cephalic , atraumatic Eyes: Pupils equal , round, reactive to light Nose/Throat: No nasal drainage, patent upper airway without erythema or exudate.  Neck: Supple, Full range of motion, No anterior adenopathy or palpable thyroid masses Lungs: Clear to ascultation without wheezes , rhonchi, or rales Heart: Regular rate, regular rhythm without murmurs , gallops , or rubs Abdomen: Soft, non tender without rebound, guarding , or rigidity; bowel sounds positive and symmetric in all 4 quadrants. No organomegaly .        ExtremitiesPatient has full range of motion of the right knee and right hip without any  obvious reproduction of pain. She does have discomfort with palpation posteriorly over the joint without any fluctuance, erythema. Patient's extremities neurovascularly intact. Patella is well aligned. Negative Lachman testing Neurologic: normal ambulation, Motor symmetric without deficits, sensory intact Skin: warm, dry, no rashes     Radiology:   DG Knee 2 Views Right (Final result) Result time: 11/28/15 12:35:10   Final result by Rad Results In Interface (11/28/15 12:35:10)   Narrative:   CLINICAL DATA: Pain. No history of trauma  EXAM: RIGHT KNEE - 1-2 VIEW  COMPARISON: None.  FINDINGS: Frontal and lateral views were obtained. There is no fracture or dislocation. No joint effusion. There is mild spurring along the posterior patella. No appreciable joint space narrowing. No erosive change.  IMPRESSION: Mild spurring along the posterior aspect of patella. No appreciable joint space narrowing. No fracture or effusion.   Electronically Signed By: Lowella Grip III M.D. On: 11/28/2015 12:35          US Venous Img Lower Unilateral Right (Final result) Result time: 11/28/15 11:02:20   Final result by Rad Results In Interface (11/28/15 11:02:20)   Narrative:   CLINICAL DATA: Pain and swelling in the right lower extremity for 8 hours.  EXAM: RIGHT LOWER EXTREMITY VENOUS DOPPLER ULTRASOUND  TECHNIQUE: Gray-scale sonography with graded compression, as well as color Doppler and duplex ultrasound were performed to evaluate the lower extremity deep venous systems from the level of the common femoral vein and including the common femoral, femoral, profunda femoral, popliteal and calf veins including the posterior tibial, peroneal and gastrocnemius veins when visible. The superficial great saphenous vein was also interrogated. Spectral Doppler was utilized to evaluate flow at rest and with distal augmentation maneuvers in the common femoral, femoral and  popliteal veins.  COMPARISON: None.  FINDINGS: Contralateral Common Femoral Vein: Respiratory phasicity is normal and symmetric with the symptomatic side. No evidence of thrombus. Normal compressibility.  Common Femoral Vein: No evidence of thrombus. Normal compressibility, respiratory phasicity and response to augmentation.  Saphenofemoral Junction: No evidence of thrombus. Normal compressibility and flow on color Doppler imaging.  Profunda Femoral Vein: No evidence of thrombus. Normal compressibility and flow on color Doppler imaging.  Femoral Vein: No evidence of thrombus. Normal compressibility, respiratory phasicity and response to augmentation.  Popliteal Vein: No evidence of thrombus. Normal compressibility, respiratory phasicity and response to augmentation.  Calf Veins: No evidence of thrombus. Normal compressibility and flow on color Doppler imaging.  Superficial Great Saphenous Vein: No evidence of thrombus. Normal compressibility and flow on color Doppler  imaging.  Venous Reflux: None.  Other Findings: None.  IMPRESSION: No evidence of deep venous thrombosis in the right lower extremity.   Electronically Signed By: Ilona Sorrel M.D.      I personally reviewed the radiologic studies   ED Course:  Patient received some Ultram here in emergency department some symptomatic improvement. She does not appear to have a deep venous thrombosis in her right lower extremity and her knee film showed a posterior oriented bone spur. I'm not sure if this is exactly what causing the patient's discomfort is a some form of intra-articular injuries such his meniscus tear, etc. Patient does describe some discomfort with flexion and walking upstairs. She is not appear to have osteomyelitis, necrotizing fasciitis, or other life or limb threatening issues in her right lower extremity.    Assessment:  Nontraumatic right knee pain  Final Clinical Impression:   Final  diagnoses:  Knee pain, acute, right     Plan: * Outpatient Prescription for Ultram Patient was advised to return immediately if condition worsens. Patient was advised to follow up with their primary care physician or other specialized physicians involved in their outpatient care. The patient and/or family member/power of attorney had laboratory results reviewed at the bedside. All questions and concerns were addressed and appropriate discharge instructions were distributed by the nursing staff.            Daymon Larsen, MD 11/28/15 [phone]

## 2015-11-28 NOTE — ED Notes (Addendum)
Patient arrives to [facility] ED with complaint of right leg pain. Patient was seen by [facility] and was sent to ED for evaluation for DVT. Patient states that she has tenderness and selling from mid lowerleg to mid upper leg. Swelling noted. Patient states 'I woke from sleep with this, but just say I was pole dancing instead"

## 2016-03-20 ENCOUNTER — Emergency Department: Payer: 59

## 2016-03-20 ENCOUNTER — Emergency Department
Admission: EM | Admit: 2016-03-20 | Discharge: 2016-03-20 | Disposition: A | Discharge status: Home / Self Care | Payer: 59 | Attending: Emergency Medicine | Admitting: Emergency Medicine

## 2016-03-20 ENCOUNTER — Encounter: Payer: Self-pay | Admitting: Emergency Medicine

## 2016-03-20 DIAGNOSIS — I10 Essential (primary) hypertension: Secondary | ICD-10-CM | POA: Diagnosis not present

## 2016-03-20 DIAGNOSIS — Z79899 Other long term (current) drug therapy: Secondary | ICD-10-CM | POA: Diagnosis not present

## 2016-03-20 DIAGNOSIS — E119 Type 2 diabetes mellitus without complications: Secondary | ICD-10-CM | POA: Insufficient documentation

## 2016-03-20 DIAGNOSIS — R197 Diarrhea, unspecified: Secondary | ICD-10-CM | POA: Insufficient documentation

## 2016-03-20 DIAGNOSIS — J45909 Unspecified asthma, uncomplicated: Secondary | ICD-10-CM | POA: Insufficient documentation

## 2016-03-20 DIAGNOSIS — R1031 Right lower quadrant pain: Secondary | ICD-10-CM | POA: Diagnosis present

## 2016-03-20 DIAGNOSIS — R110 Nausea: Secondary | ICD-10-CM | POA: Diagnosis not present

## 2016-03-20 DIAGNOSIS — N760 Acute vaginitis: Secondary | ICD-10-CM | POA: Diagnosis not present

## 2016-03-20 DIAGNOSIS — B9689 Other specified bacterial agents as the cause of diseases classified elsewhere: Secondary | ICD-10-CM

## 2016-03-20 HISTORY — DX: Unspecified asthma, uncomplicated: J45.909

## 2016-03-20 LAB — COMPREHENSIVE METABOLIC PANEL
ALBUMIN: 4.2 g/dL (ref 3.5–5.0)
ALK PHOS: 88 U/L (ref 38–126)
ALT: 39 U/L (ref 14–54)
AST: 43 U/L — ABNORMAL HIGH (ref 15–41)
Anion gap: 8 (ref 5–15)
BUN: 20 mg/dL (ref 6–20)
CALCIUM: 9.2 mg/dL (ref 8.9–10.3)
CO2: 28 mmol/L (ref 22–32)
CREATININE: 0.68 mg/dL (ref 0.44–1.00)
Chloride: 98 mmol/L — ABNORMAL LOW (ref 101–111)
GFR calc Af Amer: >60 mL/min (ref >60)
GFR calc non Af Amer: >60 mL/min (ref >60)
GLUCOSE: 204 mg/dL — AB (ref 65–99)
Potassium: 3.4 mmol/L — ABNORMAL LOW (ref 3.5–5.1)
SODIUM: 134 mmol/L — AB (ref 135–145)
Total Bilirubin: 0.6 mg/dL (ref 0.3–1.2)
Total Protein: 7.4 g/dL (ref 6.5–8.1)

## 2016-03-20 LAB — CBC
HCT: 42.7 % (ref 35.0–47.0)
Hemoglobin: 15.2 g/dL (ref 12.0–16.0)
MCH: 28.9 pg (ref 26.0–34.0)
MCHC: 35.7 g/dL (ref 32.0–36.0)
MCV: 81.1 fL (ref 80.0–100.0)
PLATELETS: 208 10*3/uL (ref 150–440)
RBC: 5.27 MIL/uL — ABNORMAL HIGH (ref 3.80–5.20)
RDW: 13 % (ref 11.5–14.5)
WBC: 7.5 10*3/uL (ref 3.6–11.0)

## 2016-03-20 LAB — URINALYSIS COMPLETE WITH MICROSCOPIC (ARMC ONLY)
BILIRUBIN URINE: NEGATIVE
Bacteria, UA: NONE SEEN
Glucose, UA: 50 mg/dL — AB
Hgb urine dipstick: NEGATIVE
Nitrite: NEGATIVE
PROTEIN: 30 mg/dL — AB
RBC / HPF: NONE SEEN RBC/hpf (ref 0–5)
Specific Gravity, Urine: 1.028 (ref 1.005–1.030)
pH: 5 (ref 5.0–8.0)

## 2016-03-20 LAB — LIPASE, BLOOD: Lipase: 27 U/L (ref 11–51)

## 2016-03-20 LAB — GLUCOSE, CAPILLARY: Glucose-Capillary: 94 mg/dL (ref 65–99)

## 2016-03-20 LAB — CHLAMYDIA/NGC RT PCR (ARMC ONLY)
Chlamydia Tr: NOT DETECTED
N GONORRHOEAE: NOT DETECTED

## 2016-03-20 LAB — WET PREP, GENITAL
Sperm: NONE SEEN
Trich, Wet Prep: NONE SEEN
YEAST WET PREP: NONE SEEN

## 2016-03-20 MED ORDER — INSULIN ASPART 100 UNIT/ML ~~LOC~~ SOLN
10.0000 [IU] | Freq: Once | SUBCUTANEOUS | Status: AC
  Administered 2016-03-20: 10 [IU] via SUBCUTANEOUS
  Filled 2016-03-20: qty 10
  Filled 2016-03-20: qty 0.1

## 2016-03-20 MED ORDER — GI COCKTAIL ~~LOC~~
ORAL | Status: AC
  Administered 2016-03-20: 30 mL via ORAL
  Filled 2016-03-20: qty 30

## 2016-03-20 MED ORDER — IOPAMIDOL (ISOVUE-300) INJECTION 61%
30.0000 mL | Freq: Once | INTRAVENOUS | Status: AC
  Administered 2016-03-20: 30 mL via ORAL
  Filled 2016-03-20: qty 30

## 2016-03-20 MED ORDER — ONDANSETRON HCL 4 MG/2ML IJ SOLN
INTRAMUSCULAR | Status: AC
  Administered 2016-03-20: 4 mg via INTRAVENOUS
  Filled 2016-03-20: qty 2

## 2016-03-20 MED ORDER — ONDANSETRON HCL 4 MG/2ML IJ SOLN
4.0000 mg | Freq: Once | INTRAMUSCULAR | Status: AC
  Administered 2016-03-20: 4 mg via INTRAVENOUS
  Filled 2016-03-20: qty 2

## 2016-03-20 MED ORDER — IOPAMIDOL (ISOVUE-300) INJECTION 61%
100.0000 mL | Freq: Once | INTRAVENOUS | Status: AC | PRN
  Administered 2016-03-20: 100 mL via INTRAVENOUS
  Filled 2016-03-20: qty 100

## 2016-03-20 MED ORDER — PROMETHAZINE HCL 25 MG/ML IJ SOLN
INTRAMUSCULAR | Status: AC
  Filled 2016-03-20: qty 1

## 2016-03-20 MED ORDER — PROMETHAZINE HCL 12.5 MG PO TABS: 12.5000 mg | ORAL_TABLET | Freq: Four times a day (QID) | ORAL | 0 refills | Status: DC | PRN

## 2016-03-20 MED ORDER — PROMETHAZINE HCL 25 MG RE SUPP: 25.0000 mg | Freq: Four times a day (QID) | RECTAL | 0 refills | Status: DC | PRN

## 2016-03-20 MED ORDER — PROMETHAZINE HCL 25 MG/ML IJ SOLN
25.0000 mg | Freq: Once | INTRAMUSCULAR | Status: AC
  Administered 2016-03-20: 25 mg via INTRAVENOUS

## 2016-03-20 MED ORDER — GI COCKTAIL ~~LOC~~
30.0000 mL | Freq: Once | ORAL | Status: AC
  Administered 2016-03-20: 30 mL via ORAL

## 2016-03-20 MED ORDER — ONDANSETRON HCL 4 MG/2ML IJ SOLN
4.0000 mg | Freq: Once | INTRAMUSCULAR | Status: AC
  Administered 2016-03-20: 4 mg via INTRAVENOUS

## 2016-03-20 MED ORDER — HYDROMORPHONE HCL 1 MG/ML IJ SOLN
0.5000 mg | Freq: Once | INTRAMUSCULAR | Status: AC
  Administered 2016-03-20: 0.5 mg via INTRAVENOUS
  Filled 2016-03-20: qty 1

## 2016-03-20 MED ORDER — SODIUM CHLORIDE 0.9 % IV BOLUS (SEPSIS)
1000.0000 mL | Freq: Once | INTRAVENOUS | Status: AC
  Administered 2016-03-20: 1000 mL via INTRAVENOUS

## 2016-03-20 NOTE — ED Notes (Signed)
Spoke with Dr. Burlene Arnt about pt's sx and PCP's recommendation, no new orders at this time.

## 2016-03-20 NOTE — Discharge Instructions (Addendum)
Please stop taking your oral metronidazole, and switch to the intravaginal application of the same medication. He will take this medication for 5 days. You may take Phenergan for nausea and vomiting. You may take Tylenol or Motrin for mild to moderate pain, and Percocet for severe pain. Do not drive within 8 hours of taking Percocet.  Please take a clear liquid diet for the next 48 hours, then advance to a bland BRAT diet as tolerated.  Please make a follow-up appointment with your primary care physician for reevaluation.  Return to the emergency department for severe pain, fever, inability to keep down fluids, or any other symptoms concerning to you.

## 2016-03-20 NOTE — ED Triage Notes (Signed)
Pt with RLQ pain since Thursday, reports nausea. Sent over by Princella Ion for CT scan.

## 2016-03-20 NOTE — ED Provider Notes (Signed)
[facility] Emergency Department Provider Note  ____________________________________________  Time seen: Approximately 2:15 PM  I have reviewed the triage vital signs and the nursing notes.   HISTORY  Chief Complaint Abdominal Pain    HPI Andrea Pollard is a 60 y.o. female w/ DM2, s/p gastric sleeve, hysterectomy, and cholecystectomy remotely presenting with right lower quadrant abdominal pain, nausea. The patient reports that since Thursday, the patient has had a right lower quadrant pain that is "sharp" and "always there." This is been associated with nausea but no vomiting. She has not had any change in her chronic loose stool. No fevers or chills. No urinary symptoms. No change in vaginal discharge although the patient was swabbed and found to have bacterial vaginosis 5 days ago and has been on treatment with Flagyl without improvement in her symptoms.   Past Medical History:  Diagnosis Date  . Asthma   . Diabetes mellitus without complication ([place])   . Hypertension     There are no active problems to display for this patient.   Past Surgical History:  Procedure Laterality Date  . ABDOMINAL HYSTERECTOMY    . CESAREAN SECTION    . CHOLECYSTECTOMY    . LAPAROSCOPIC GASTRIC SLEEVE RESECTION      Current Outpatient Rx  . Order #: [id number] Class: Print  . Order #: [id number] Class: Print  . Order #: [id number] Class: Print  . Order #: [id number] Class: Print    Allergies Bactrim [sulfamethoxazole-trimethoprim]; Ibuprofen; Levaquin [levofloxacin in d5w]; and Nitroglycerin  No family history on file.  Social History Social History  Substance Use Topics  . Smoking status: Never Smoker  . Smokeless tobacco: Not on file  . Alcohol use No    Review of Systems Constitutional: No fever/chills.No lightheadedness or syncope. Eyes: No visual changes. ENT: No sore throat. No congestion or rhinorrhea. Cardiovascular: Denies chest pain. Denies  palpitations. Respiratory: Denies shortness of breath.  No cough. Gastrointestinal: Positive right lower quadrant abdominal pain.  Positive nausea, no vomiting.  Chronic unchanged loose stool.  No constipation. Genitourinary: Negative for dysuria. Musculoskeletal: Negative for back pain. Skin: Negative for rash. Neurological: Negative for headaches. No focal numbness, tingling or weakness.   10-point ROS otherwise negative.  ____________________________________________   PHYSICAL EXAM:  VITAL SIGNS: ED Triage Vitals  Enc Vitals Group     BP 03/20/16 1204 (!) 141/63     Pulse Rate 03/20/16 1204 76     Resp 03/20/16 1204 17     Temp 03/20/16 1204 98.2 F (36.8 C)     Temp Source 03/20/16 1204 Oral     SpO2 03/20/16 1204 99 %     Weight 03/20/16 1205 221 lb (100.2 kg)     Height 03/20/16 1205 5\' 5"  (1.651 m)     Head Circumference --      Peak Flow --      Pain Score 03/20/16 1205 9     Pain Loc --      Pain Edu? --      Excl. in [place]? --     Constitutional: Alert and oriented. Chronically ill appearing and mildly uncomfortable but nontoxic. Answers questions appropriately. Eyes: Conjunctivae are normal.  EOMI. No scleral icterus. No eye discharge. Head: Atraumatic. Nose: No congestion/rhinnorhea. Mouth/Throat: Mucous membranes are dry.  Neck: No stridor.  Supple.   Cardiovascular: Normal rate, regular rhythm. No murmurs, rubs or gallops.  Respiratory: Normal respiratory effort.  No accessory muscle use or retractions. Lungs CTAB.  No wheezes, rales or ronchi.  Gastrointestinal: Obese. Soft and nondistended. Positive right lower quadrant tenderness to palpation. No guarding or rebound.  No peritoneal signs. Musculoskeletal: No LE edema. Neurologic:  A&Ox3.  Speech is clear.  Face and smile are symmetric.  EOMI.  Moves all extremities well. Skin:  Skin is warm, dry and intact. No rash noted. Psychiatric: Mood and affect are normal. Speech and behavior are normal.  Normal  judgement.  ____________________________________________   LABS (all labs ordered are listed, but only abnormal results are displayed)  Labs Reviewed  WET PREP, GENITAL - Abnormal; Notable for the following:       Result Value   Clue Cells Wet Prep HPF POC PRESENT (*)    WBC, Wet Prep HPF POC MODERATE (*)    All other components within normal limits  COMPREHENSIVE METABOLIC PANEL - Abnormal; Notable for the following:    Sodium 134 (*)    Potassium 3.4 (*)    Chloride 98 (*)    Glucose, Bld 204 (*)    AST 43 (*)    All other components within normal limits  CBC - Abnormal; Notable for the following:    RBC 5.27 (*)    All other components within normal limits  URINALYSIS COMPLETEWITH MICROSCOPIC ([facility] ONLY) - Abnormal; Notable for the following:    Color, Urine YELLOW (*)    APPearance CLEAR (*)    Glucose, UA 50 (*)    Ketones, ur TRACE (*)    Protein, ur 30 (*)    Leukocytes, UA 1+ (*)    Squamous Epithelial / LPF 0-5 (*)    All other components within normal limits  CHLAMYDIA/NGC RT PCR ([facility] ONLY)  LIPASE, BLOOD   ____________________________________________  EKG  Not indicated ____________________________________________  RADIOLOGY  US Transvaginal Non-ob  Result Date: 03/20/2016 CLINICAL DATA:  Right lower quadrant/ pelvic pain EXAM: TRANSABDOMINAL AND TRANSVAGINAL ULTRASOUND OF PELVIS TECHNIQUE: Study was performed transabdominally to optimize pelvic field of view evaluation and transvaginally to optimize internal visceral architecture evaluation. COMPARISON:  CT abdomen and pelvis March 20, 2016 FINDINGS: Uterus Surgically absent. Right ovary Unable to visualize by transabdominal or transvaginal technique. Question surgical absence. No right-sided pelvic mass. Left ovary Measurements: 2.5 x 2.0 x 2.0 cm. Normal appearance/no adnexal mass. Other findings No abnormal free fluid. IMPRESSION: Uterus absent. Unable to visualize right ovary. Question  absence of right ovary. No right-sided pelvic mass. Left ovary appears unremarkable. No free pelvic fluid. Electronically Signed   By: Lowella Grip III M.D.   On: 03/20/2016 17:41   US Pelvis Complete  Result Date: 03/20/2016 CLINICAL DATA:  Right lower quadrant/ pelvic pain EXAM: TRANSABDOMINAL AND TRANSVAGINAL ULTRASOUND OF PELVIS TECHNIQUE: Study was performed transabdominally to optimize pelvic field of view evaluation and transvaginally to optimize internal visceral architecture evaluation. COMPARISON:  CT abdomen and pelvis March 20, 2016 FINDINGS: Uterus Surgically absent. Right ovary Unable to visualize by transabdominal or transvaginal technique. Question surgical absence. No right-sided pelvic mass. Left ovary Measurements: 2.5 x 2.0 x 2.0 cm. Normal appearance/no adnexal mass. Other findings No abnormal free fluid. IMPRESSION: Uterus absent. Unable to visualize right ovary. Question absence of right ovary. No right-sided pelvic mass. Left ovary appears unremarkable. No free pelvic fluid. Electronically Signed   By: Lowella Grip III M.D.   On: 03/20/2016 17:41   Ct Abdomen Pelvis W Contrast  Result Date: 03/20/2016 CLINICAL DATA:  RIGHT lower quadrant pain and fever for 4 days, nausea, history diabetes mellitus, hypertension, asthma EXAM: CT ABDOMEN AND PELVIS  WITH CONTRAST TECHNIQUE: Multidetector CT imaging of the abdomen and pelvis was performed using the standard protocol following bolus administration of intravenous contrast. Sagittal and coronal MPR images reconstructed from axial data set. CONTRAST:  139mL ISOVUE-300 IOPAMIDOL (ISOVUE-300) INJECTION 61% IV. Dilute oral contrast. COMPARISON:  04/24/2013 FINDINGS: Lower chest: Tiny nodules at lung bases appear grossly stable, largest 5 mm diameter image 6. Hepatobiliary: Gallbladder surgically absent. Liver normal appearance. Pancreas: Normal appearance Spleen: 13.1 x 5.4 x 12.8 cm (volume = 470 cm^3). No focal lesions.  Adrenals/Urinary Tract: Small LEFT adrenal nodule 13 x 9 mm. Nonobstructing calculus inferior pole RIGHT kidney 5 mm diameter image 44. Kidneys, ureters, and bladder otherwise normal appearance. Stomach/Bowel: Prior gastric surgery. Normal appendix. Large and small bowel loops unremarkable. Vascular/Lymphatic: Atherosclerotic calcification aorta. Few scattered normal size retroperitoneal nodes without adenopathy. Reproductive: Uterus surgically absent.  Normal appearing ovaries. Other: No free air, free fluid or inflammatory process. Tiny umbilical hernia containing fat. Musculoskeletal: No acute osseous findings. IMPRESSION: Post cholecystectomy and hysterectomy. Stable tiny bibasilar lung nodules. No acute intra-abdominal or intrapelvic abnormalities. Aortic atherosclerosis. Small LEFT adrenal nodule 13 x 9 mm, indeterminate in attenuation but stable since 04/16/2012. Electronically Signed   By: Lavonia Dana M.D.   On: 03/20/2016 16:41    ____________________________________________   PROCEDURES  Procedure(s) performed: None  Procedures  Critical Care performed: No ____________________________________________   INITIAL IMPRESSION / ASSESSMENT AND PLAN / ED COURSE  Pertinent labs & imaging results that were available during my care of the patient were reviewed by me and considered in my medical decision making (see chart for details).  60 y.o. female with a history of multiple abdominal surgeries presenting with several days of abdominal pain in the right lower quadrant, nausea without vomiting, and hyperglycemia. Overall, the patient is uncomfortable appearing but nontoxic. Her vital signs are reassuring and she is afebrile here. Multiple possible etiologies are on the differential, including appendicitis, ovarian pathology, colitis or intestinal pathology. Renal colic is also possible but less likely given the description of the patient's pain. We will get a CT abdomen for further evaluation  and initiate some dramatic treatment. Here, the patient is hyperglycemic with blood sugar in the 200s, but her anion gap is normal and she does not have DKA. She is mildly hyponatremic and hypokalemic, which should improve with symptom at a treatment as well.  ----------------------------------------- 4:45 PM on 03/20/2016 -----------------------------------------  The patient's CT scan does not show any acute intra-abdominal process, no acute cause for the patient's right lower quadrant pain. The patient does not have a urinary tract infection, her white blood cell count is normal. I will plan to perform a pelvic examination and get a pelvic ultrasound to rule out any additional pathology.  ----------------------------------------- 6:21 PM on 03/20/2016 -----------------------------------------  The patient's urinalysis does not show an infection. She does have clue cells on her wet prep, and is currently under treatment with Flagyl. She is on day 5 of oral Flagyl. I wonder if some of her symptoms may be side effects to her medication, and I will have her discontinue the oral Flagyl and start taking intravaginal Flagyl to treat her BV. Here, the patient has not responded very well to Zofran, so we'll try to treat her with Phenergan for her nausea. As long as the patient's pain has improved and she is tolerating liquid by mouth, she has had a thorough evaluation for both intra-abdominal as well as GU-related causes for her pain without any obvious findings. I'll  plan to follow up with her primary care physician. ____________________________________________  FINAL CLINICAL IMPRESSION(S) / ED DIAGNOSES  Final diagnoses:  Right lower quadrant pain  Bacterial vaginosis    Clinical Course  Comment By Time  The patient reports that her pain is significantly better after Dilaudid, but she continues to have nausea because of drinking the contrast. In addition she has the sensation of indigestion. I  will treat her with Zofran and a GI cocktail, and we will complete the pelvic examination and await ultrasound. Eula Listen, MD 10/30 1650      NEW MEDICATIONS STARTED DURING THIS VISIT:  New Prescriptions   No medications on file      Eula Listen, MD 03/20/16 1825

## 2016-04-21 DIAGNOSIS — J189 Pneumonia, unspecified organism: Secondary | ICD-10-CM

## 2016-04-21 HISTORY — DX: Pneumonia, unspecified organism: J18.9

## 2016-05-22 DIAGNOSIS — I82419 Acute embolism and thrombosis of unspecified femoral vein: Secondary | ICD-10-CM

## 2016-05-22 DIAGNOSIS — I2699 Other pulmonary embolism without acute cor pulmonale: Secondary | ICD-10-CM

## 2016-05-22 HISTORY — DX: Other pulmonary embolism without acute cor pulmonale: I26.99

## 2016-05-22 HISTORY — DX: Acute embolism and thrombosis of unspecified femoral vein: I82.419

## 2016-06-03 ENCOUNTER — Emergency Department: Payer: 59

## 2016-06-03 ENCOUNTER — Emergency Department
Admission: EM | Admit: 2016-06-03 | Discharge: 2016-06-03 | Disposition: A | Discharge status: Home / Self Care | Payer: 59 | Attending: Emergency Medicine | Admitting: Emergency Medicine

## 2016-06-03 DIAGNOSIS — E119 Type 2 diabetes mellitus without complications: Secondary | ICD-10-CM | POA: Insufficient documentation

## 2016-06-03 DIAGNOSIS — J45909 Unspecified asthma, uncomplicated: Secondary | ICD-10-CM | POA: Insufficient documentation

## 2016-06-03 DIAGNOSIS — Z7984 Long term (current) use of oral hypoglycemic drugs: Secondary | ICD-10-CM | POA: Insufficient documentation

## 2016-06-03 DIAGNOSIS — J209 Acute bronchitis, unspecified: Secondary | ICD-10-CM | POA: Insufficient documentation

## 2016-06-03 DIAGNOSIS — R531 Weakness: Secondary | ICD-10-CM | POA: Diagnosis present

## 2016-06-03 DIAGNOSIS — Z79899 Other long term (current) drug therapy: Secondary | ICD-10-CM | POA: Insufficient documentation

## 2016-06-03 DIAGNOSIS — I10 Essential (primary) hypertension: Secondary | ICD-10-CM | POA: Diagnosis not present

## 2016-06-03 DIAGNOSIS — R918 Other nonspecific abnormal finding of lung field: Secondary | ICD-10-CM

## 2016-06-03 LAB — BASIC METABOLIC PANEL
ANION GAP: 13 (ref 5–15)
BUN: 18 mg/dL (ref 6–20)
CHLORIDE: 107 mmol/L (ref 101–111)
CO2: 23 mmol/L (ref 22–32)
Calcium: 9.3 mg/dL (ref 8.9–10.3)
Creatinine, Ser: 0.6 mg/dL (ref 0.44–1.00)
GFR calc Af Amer: >60 mL/min (ref >60)
GLUCOSE: 171 mg/dL — AB (ref 65–99)
POTASSIUM: 4 mmol/L (ref 3.5–5.1)
Sodium: 143 mmol/L (ref 135–145)

## 2016-06-03 LAB — CBC
HEMATOCRIT: 42.3 % (ref 35.0–47.0)
HEMOGLOBIN: 14.7 g/dL (ref 12.0–16.0)
MCH: 28.8 pg (ref 26.0–34.0)
MCHC: 34.7 g/dL (ref 32.0–36.0)
MCV: 82.9 fL (ref 80.0–100.0)
Platelets: 219 10*3/uL (ref 150–440)
RBC: 5.1 MIL/uL (ref 3.80–5.20)
RDW: 13.6 % (ref 11.5–14.5)
WBC: 8.8 10*3/uL (ref 3.6–11.0)

## 2016-06-03 LAB — INFLUENZA PANEL BY PCR (TYPE A & B)
Influenza A By PCR: NEGATIVE
Influenza B By PCR: NEGATIVE

## 2016-06-03 LAB — TROPONIN I: Troponin I: <0.03 ng/mL (ref <0.03)

## 2016-06-03 LAB — FIBRIN DERIVATIVES D-DIMER (ARMC ONLY): Fibrin derivatives D-dimer (ARMC): 376 (ref 0–499)

## 2016-06-03 MED ORDER — PREDNISONE 20 MG PO TABS
40.0000 mg | ORAL_TABLET | Freq: Once | ORAL | Status: AC
  Administered 2016-06-03: 40 mg via ORAL
  Filled 2016-06-03: qty 2

## 2016-06-03 MED ORDER — IPRATROPIUM-ALBUTEROL 0.5-2.5 (3) MG/3ML IN SOLN
3.0000 mL | Freq: Once | RESPIRATORY_TRACT | Status: AC
  Administered 2016-06-03: 3 mL via RESPIRATORY_TRACT
  Filled 2016-06-03: qty 3

## 2016-06-03 MED ORDER — PREDNISONE 10 MG PO TABS: ORAL_TABLET | ORAL | 0 refills | Status: DC

## 2016-06-03 MED ORDER — ACETAMINOPHEN 325 MG PO TABS
650.0000 mg | ORAL_TABLET | Freq: Once | ORAL | Status: AC
  Administered 2016-06-03: 650 mg via ORAL

## 2016-06-03 MED ORDER — ACETAMINOPHEN 325 MG PO TABS
ORAL_TABLET | ORAL | Status: AC
  Administered 2016-06-03: 650 mg via ORAL
  Filled 2016-06-03: qty 2

## 2016-06-03 MED ORDER — IOPAMIDOL (ISOVUE-370) INJECTION 76%
75.0000 mL | Freq: Once | INTRAVENOUS | Status: AC | PRN
  Administered 2016-06-03: 75 mL via INTRAVENOUS

## 2016-06-03 NOTE — ED Provider Notes (Signed)
[facility] Emergency Department Provider Note ____________________________________________   I have reviewed the triage vital signs and the triage nursing note.  HISTORY  Chief Complaint Cough and Shortness of Breath   Historian Patient  HPI Andrea Pollard is a 61 y.o. female with a history of asthma, and upper respiratory symptoms for about a week now. She did take a course of antibiotics and finished yesterday and she has been trying albuterol at home.  She has not been on prednisone recently. No productive cough. No fever. Some body aches and generalized weakness. No focal weakness. No heart racing or passing out.  Symptoms are moderate.    Past Medical History:  Diagnosis Date  . Asthma   . Diabetes mellitus without complication ([place])   . Hypertension     There are no active problems to display for this patient.   Past Surgical History:  Procedure Laterality Date  . ABDOMINAL HYSTERECTOMY    . CESAREAN SECTION    . CHOLECYSTECTOMY    . LAPAROSCOPIC GASTRIC SLEEVE RESECTION      Prior to Admission medications   Medication Sig Start Date End Date Taking? Authorizing Provider  benzonatate (TESSALON) 100 MG capsule Take 1 capsule by mouth 3 (three) times daily. 05/23/16  Yes Historical Provider, MD  hydrochlorothiazide (HYDRODIURIL) 12.5 MG tablet Take 12.5 mg by mouth daily.   Yes Historical Provider, MD  metFORMIN (GLUCOPHAGE-XR) 500 MG 24 hr tablet Take 2 tablets by mouth 2 (two) times daily. 03/23/16  Yes Historical Provider, MD  albuterol (PROVENTIL HFA;VENTOLIN HFA) 108 (90 Base) MCG/ACT inhaler Inhale 2 puffs into the lungs every 6 (six) hours as needed.    Historical Provider, MD  predniSONE (DELTASONE) 10 MG tablet 40 mg by mouth daily for 4 more days 06/03/16   Lisa Roca, MD    Allergies  Allergen Reactions  . Bactrim [Sulfamethoxazole-Trimethoprim]   . Ibuprofen Other (See Comments)    Gastric bypass  . Levaquin [Levofloxacin  In D5w]   . Nitroglycerin     "bottoms me out"    History reviewed. No pertinent family history.  Social History Social History  Substance Use Topics  . Smoking status: Never Smoker  . Smokeless tobacco: Never Used  . Alcohol use No    Review of Systems  Constitutional: Negative for fever. Eyes: Negative for visual changes. ENT: Negative for sore throat. Cardiovascular: Negative for chest pain. Respiratory: Positive for shortness of breath. Gastrointestinal: Negative for abdominal pain, vomiting and diarrhea. Genitourinary: Negative for dysuria. Musculoskeletal: Negative for back pain. Skin: Negative for rash. Neurological: Negative for headache. 10 point Review of Systems otherwise negative ____________________________________________   PHYSICAL EXAM:  VITAL SIGNS: ED Triage Vitals  Enc Vitals Group     BP 06/03/16 1031 (!) 127/93     Pulse Rate 06/03/16 1031 97     Resp 06/03/16 1031 20     Temp 06/03/16 1031 98.4 F (36.9 C)     Temp Source 06/03/16 1031 Oral     SpO2 06/03/16 1031 100 %     Weight 06/03/16 1033 212 lb (96.2 kg)     Height 06/03/16 1033 5\' 5"  (1.651 m)     Head Circumference --      Peak Flow --      Pain Score 06/03/16 1033 7     Pain Loc --      Pain Edu? --      Excl. in [place]? --      Constitutional: Alert  and oriented. Well appearing and in no distress. HEENT   Head: Normocephalic and atraumatic.      Eyes: Conjunctivae are normal. PERRL. Normal extraocular movements.      Ears:         Nose: No congestion/rhinnorhea.   Mouth/Throat: Mucous membranes are moist.   Neck: No stridor. Cardiovascular/Chest: Normal rate, regular rhythm.  No murmurs, rubs, or gallops. Respiratory: Normal respiratory effort without tachypnea nor retractions. Moderate wheezing especially with deep breaths.  Gastrointestinal: Soft. No distention, no guarding, no rebound. Nontender.    Genitourinary/rectal:Deferred Musculoskeletal: Nontender with  normal range of motion in all extremities. No joint effusions.  No lower extremity tenderness.  No edema. Neurologic:  Normal speech and language. No gross or focal neurologic deficits are appreciated. Skin:  Skin is warm, dry and intact. No rash noted. Psychiatric: Mood and affect are normal. Speech and behavior are normal. Patient exhibits appropriate insight and judgment.   ____________________________________________  LABS (pertinent positives/negatives)  Labs Reviewed  BASIC METABOLIC PANEL - Abnormal; Notable for the following:       Result Value   Glucose, Bld 171 (*)    All other components within normal limits  CBC  TROPONIN I  FIBRIN DERIVATIVES D-DIMER ([facility] ONLY)  INFLUENZA PANEL BY PCR (TYPE A & B, H1N1)    ____________________________________________    EKG I, Lisa Roca, MD, the attending physician have personally viewed and interpreted all ECGs.  92 bpm. Normal sinus rhythm. Narrow QRS. Normal axis. Nonspecific ST and T-wave. ____________________________________________  RADIOLOGY All Xrays were viewed by me. Imaging interpreted by Radiologist.  Chest 2 view xray:  IMPRESSION: 1. Coarse chronic appearing interstitial prominence bilaterally without focal infiltrate. 2.  Aortic Atherosclerosis (ICD10-170.0)  CT chest angio:  IMPRESSION: 1. 2.0 x 1.7 x 1.3 cm spiculated nodule in the central right upper lobe, suspicious for a primary lung carcinoma. 2. Large number of subcentimeter nodules throughout both lungs. Some of these are irregular and could represent additional primary lung carcinomas or metastases. Some of the nodules at the lung bases are stable on abdomen CTs dating back to 2014 and others could represent metastases. 3. Mild mediastinal and right hilar adenopathy, suspicious for metastatic adenopathy. 4. No pulmonary emboli. 5. Diffuse hepatic steatosis. 6. Stable borderline  splenomegaly. __________________________________________  PROCEDURES  Procedure(s) performed: None  Critical Care performed: None  ____________________________________________   ED COURSE / ASSESSMENT AND PLAN  Pertinent labs & imaging results that were available during my care of the patient were reviewed by me and considered in my medical decision making (see chart for details).   Symptoms seem most consistent with bronchospasm and persistent wheezing in the setting of recent upper respiratory illness. Influenza negative. Chest x-ray negative for pneumonia. She is overall well-appearing. Treated for wheezing abdomen department. I am going to add prednisone.  We discussed given ongoing shortness of breath, to obtain CT scan to rule out PE. Next line no PE, however concerning mass for possible primary lung malignancy. I spoke with oncologist who will call the patient close follow-up on Monday. I discussed patient these results.    CONSULTATIONS:   Dr. Janese Banks, oncology -- office will call on Monday to make a follow-up appointment.   Patient / Family / Caregiver informed of clinical course, medical decision-making process, and agree with plan.   I discussed return precautions, follow-up instructions, and discharge instructions with patient and/or family.   ___________________________________________   FINAL CLINICAL IMPRESSION(S) / ED DIAGNOSES   Final diagnoses:  Bronchospasm  with bronchitis, acute  Lung mass              Note: This dictation was prepared with Dragon dictation. Any transcriptional errors that result from this process are unintentional    Lisa Roca, MD 06/03/16 1452

## 2016-06-03 NOTE — ED Notes (Signed)
Pt able to ambulate without oxygen saturation dropping. Pt began coughing while walking and RR increased after coughing but no drop in stats while coughing either.

## 2016-06-03 NOTE — ED Triage Notes (Signed)
Pt c/o cough and SOB x2 weeks, Reports finishing Augmentin rx x1 day ago. Seen at [facility] today and sent to ED for wheezing and continued SOB. Pt reports child with RSV at home.

## 2016-06-03 NOTE — Discharge Instructions (Signed)
You were evaluated for shortness of breath and found to be wheezing and are being treated for bronchospasm or bronchitis with prednisone and please continue the albuterol inhaler.  As we discussed, the CAT scan also showed a mass in the lung which needs further evaluation with the oncology team. They will call you on Monday, but please call if you have not heard by the end of the day.  Emergency department for any worsening trouble breathing, shortness breath, chest pain, dizziness or passing out, fever, or any other symptoms concerning to you.

## 2016-06-03 NOTE — ED Notes (Signed)
Patient transported to CT 

## 2016-06-07 ENCOUNTER — Inpatient Hospital Stay: Payer: 59 | Attending: Oncology | Admitting: Oncology

## 2016-06-07 ENCOUNTER — Inpatient Hospital Stay: Payer: 59

## 2016-06-07 ENCOUNTER — Encounter: Payer: Self-pay | Admitting: Oncology

## 2016-06-07 VITALS — BP 125/84 | HR 103 | Temp 99.1°F | Resp 20 | Ht 65.0 in | Wt 213.7 lb

## 2016-06-07 DIAGNOSIS — J45909 Unspecified asthma, uncomplicated: Secondary | ICD-10-CM | POA: Insufficient documentation

## 2016-06-07 DIAGNOSIS — I700 Atherosclerosis of aorta: Secondary | ICD-10-CM | POA: Insufficient documentation

## 2016-06-07 DIAGNOSIS — E041 Nontoxic single thyroid nodule: Secondary | ICD-10-CM | POA: Diagnosis not present

## 2016-06-07 DIAGNOSIS — Z79899 Other long term (current) drug therapy: Secondary | ICD-10-CM | POA: Diagnosis not present

## 2016-06-07 DIAGNOSIS — I10 Essential (primary) hypertension: Secondary | ICD-10-CM | POA: Insufficient documentation

## 2016-06-07 DIAGNOSIS — E119 Type 2 diabetes mellitus without complications: Secondary | ICD-10-CM | POA: Diagnosis not present

## 2016-06-07 DIAGNOSIS — R911 Solitary pulmonary nodule: Secondary | ICD-10-CM | POA: Insufficient documentation

## 2016-06-07 DIAGNOSIS — R918 Other nonspecific abnormal finding of lung field: Secondary | ICD-10-CM

## 2016-06-07 DIAGNOSIS — R0602 Shortness of breath: Secondary | ICD-10-CM | POA: Diagnosis not present

## 2016-06-07 DIAGNOSIS — K760 Fatty (change of) liver, not elsewhere classified: Secondary | ICD-10-CM | POA: Diagnosis not present

## 2016-06-07 DIAGNOSIS — R590 Localized enlarged lymph nodes: Secondary | ICD-10-CM | POA: Insufficient documentation

## 2016-06-07 DIAGNOSIS — Z7984 Long term (current) use of oral hypoglycemic drugs: Secondary | ICD-10-CM | POA: Insufficient documentation

## 2016-06-07 LAB — PROTIME-INR
INR: 1.01
Prothrombin Time: 13.3 seconds (ref 11.4–15.2)

## 2016-06-07 LAB — APTT: aPTT: 25 seconds (ref 24–36)

## 2016-06-07 NOTE — Progress Notes (Signed)
[place]  [phone] Fax:(336) [phone]  ID: Andrea Pollard OB: 1955/11/23  MR#: [id number]  [id number]  Patient Care Team: Ellamae Sia, MD as PCP - General (Internal Medicine)  CHIEF COMPLAINT: Mass of upper lobe of right lung.  INTERVAL HISTORY: Patient is a 61 year old female who presented to the emergency room with increasing shortness of breath secondary to "asthma attack". Subsequent workup included a CT scan which revealed a central right upper lobe lung nodule highly suspicious for underlying malignancy. Currently, she is anxious but otherwise feels well. Her shortness of breath has improved, but still evident. She has no neurologic complaints. She denies any recent fevers. She has a good appetite and denies weight loss. She has no cough, chest pain, or hemoptysis. She denies any nausea, vomiting, constipation, or diarrhea. She has no urinary complaints. Patient otherwise feels well and offers no further specific complaints.  REVIEW OF SYSTEMS:   Review of Systems  Constitutional: Negative.  Negative for fever, malaise/fatigue and weight loss.  Respiratory: Positive for shortness of breath. Negative for cough and hemoptysis.   Cardiovascular: Negative.  Negative for chest pain and leg swelling.  Gastrointestinal: Negative.  Negative for abdominal pain.  Genitourinary: Negative.   Musculoskeletal: Negative.   Neurological: Negative.  Negative for weakness.  Psychiatric/Behavioral: The patient is nervous/anxious and has insomnia.     As per HPI. Otherwise, a complete review of systems is negative.  PAST MEDICAL HISTORY: Past Medical History:  Diagnosis Date  . Asthma   . Diabetes mellitus without complication ([place])   . Hypertension     PAST SURGICAL HISTORY: Past Surgical History:  Procedure Laterality Date  . ABDOMINAL HYSTERECTOMY    . CESAREAN SECTION    . CHOLECYSTECTOMY    . LAPAROSCOPIC GASTRIC SLEEVE RESECTION       FAMILY HISTORY: Family History  Problem Relation Age of Onset  . Stroke Father   . Ovarian cancer Maternal Grandmother     ADVANCED DIRECTIVES (Y/N):  N  HEALTH MAINTENANCE: Social History  Substance Use Topics  . Smoking status: Never Smoker  . Smokeless tobacco: Never Used  . Alcohol use No     Colonoscopy:  PAP:  Bone density:  Lipid panel:  Allergies  Allergen Reactions  . Bactrim [Sulfamethoxazole-Trimethoprim]   . Ibuprofen Other (See Comments)    Gastric bypass  . Levaquin [Levofloxacin In D5w]   . Nitroglycerin     "bottoms me out"    Current Outpatient Prescriptions  Medication Sig Dispense Refill  . albuterol (PROVENTIL HFA;VENTOLIN HFA) 108 (90 Base) MCG/ACT inhaler Inhale 2 puffs into the lungs every 6 (six) hours as needed.    . benzonatate (TESSALON) 100 MG capsule Take 1 capsule by mouth 3 (three) times daily.  0  . hydrochlorothiazide (HYDRODIURIL) 12.5 MG tablet Take 12.5 mg by mouth daily.    Marland Kitchen MELATONIN PO Take by mouth.    . metFORMIN (GLUCOPHAGE-XR) 500 MG 24 hr tablet Take 2 tablets by mouth 2 (two) times daily.  2   No current facility-administered medications for this visit.     OBJECTIVE: Vitals:   06/07/16 1200  BP: 125/84  Pulse: (!) 103  Resp: 20  Temp: 99.1 F (37.3 C)     Body mass index is 35.57 kg/m.    ECOG FS:0 - Asymptomatic  General: Well-developed, well-nourished, no acute distress. Eyes: Pink conjunctiva, anicteric sclera. HEENT: Normocephalic, moist mucous membranes, clear oropharnyx. Lungs: Clear to auscultation bilaterally. Heart: Regular rate and rhythm.  No rubs, murmurs, or gallops. Abdomen: Soft, nontender, nondistended. No organomegaly noted, normoactive bowel sounds. Musculoskeletal: No edema, cyanosis, or clubbing. Neuro: Alert, answering all questions appropriately. Cranial nerves grossly intact. Skin: No rashes or petechiae noted. Psych: Normal affect. Lymphatics: No cervical, calvicular, axillary  or inguinal LAD.   LAB RESULTS:  Lab Results  Component Value Date   NA 143 06/03/2016   K 4.0 06/03/2016   CL 107 06/03/2016   CO2 23 06/03/2016   GLUCOSE 171 (H) 06/03/2016   BUN 18 06/03/2016   CREATININE 0.60 06/03/2016   CALCIUM 9.3 06/03/2016   PROT 7.4 03/20/2016   ALBUMIN 4.2 03/20/2016   AST 43 (H) 03/20/2016   ALT 39 03/20/2016   ALKPHOS 88 03/20/2016   BILITOT 0.6 03/20/2016   GFRNONAA >60 06/03/2016   GFRAA >60 06/03/2016    Lab Results  Component Value Date   WBC 8.8 06/03/2016   NEUTROABS 5.2 06/04/2013   HGB 14.7 06/03/2016   HCT 42.3 06/03/2016   MCV 82.9 06/03/2016   PLT 219 06/03/2016     STUDIES: Dg Chest 2 View  Result Date: 06/03/2016 CLINICAL DATA:  Pt c/o cough and SOB x2 weeks, Reports finishing Augmentin rx x1 day ago. Seen at [facility] today and sent to ED for wheezing and continued SOB. Pt reports child with RSV at home. Hx of asthma, diabetes and HTN. nonsmoker. EXAM: CHEST - 2 VIEW COMPARISON:  03/25/2014 FINDINGS: Lungs mildly hyperinflated with coarse prominent interstitial markings diffusely. No confluent airspace infiltrate. Heart size and mediastinal contours are within normal limits. Atheromatous aorta. No effusion. Anterior vertebral endplate spurring at multiple levels in the mid thoracic spine. IMPRESSION: 1. Coarse chronic appearing interstitial prominence bilaterally without focal infiltrate. 2.  Aortic Atherosclerosis (ICD10-170.0) Electronically Signed   By: Lucrezia Europe M.D.   On: 06/03/2016 11:08   Ct Angio Chest Pe W/cm &/or Wo Cm  Result Date: 06/03/2016 CLINICAL DATA:  Cough and shortness breath for the past 2 weeks. EXAM: CT ANGIOGRAPHY CHEST WITH CONTRAST TECHNIQUE: Multidetector CT imaging of the chest was performed using the standard protocol during bolus administration of intravenous contrast. Multiplanar CT image reconstructions and MIPs were obtained to evaluate the vascular anatomy. CONTRAST:  75 cc Isovue 370 COMPARISON:  Chest  radiographs obtained earlier today. Abdomen and pelvis CT dated 03/20/2016. FINDINGS: Cardiovascular: Normally opacified pulmonary arteries with no pulmonary arterial filling defects seen. Coronary artery and aortic calcifications. Normal sized heart. Mediastinum/Nodes: Mildly enlarged mediastinal and right hilar nodes. These include an anterior superior mediastinal node with a short axis diameter of 9 mm on image number 16 of series 6, a right anterior paratracheal node with a short axis diameter of 11 mm on image number 26, precarinal node with a short axis diameter of 10 mm on image number 30, subcarinal node with a short axis diameter of 8 mm on image number 37 and right hilar node with a short axis diameter of 10 mm on image number 35. A low-density right thyroid nodule with coarse peripheral calcifications measures 1.8 cm on image number 5. There is a probable second smaller adjacent right lobe nodule and the right lobe is mildly enlarged and rounded compared to the left lobe. Lungs/Pleura: There is a 2.0 x 1.3 cm spiculated, central right upper lobe nodule on image number 36 of series 2. This measures 1.7 cm in length on coronal image number 38. There are additional smaller right upper lobe nodules more peripherally. These include 2 nodules on image number  31 measuring 6 mm in maximum diameter each. A more distal nodule measures 3 mm on image number 27. Multiple nodules are demonstrated more superiorly and medially, including a group of 3 adjacent nodules measuring 9 mm on image number 21 and 2 nodules on image number 23, measuring 5 mm and 4 mm in maximum diameter each. A 5 mm right upper lobe nodule is demonstrated on image number 26. There are multiple additional small nodules in the right middle lobe, right lower lobe, left lower lobe and left upper lobe. The largest is in the left upper lobe, measuring 8 mm in maximum diameter on coronal image number 57. This nodule in the right middle lobe nodule are  irregular in shape. No pleural fluid seen. Upper Abdomen: Diffuse low density of the liver relative to the spleen. Stable borderline enlargement of the spleen. Musculoskeletal: Extensive thoracic spine degenerative changes. No evidence of bony metastatic disease. Review of the MIP images confirms the above findings. IMPRESSION: 1. 2.0 x 1.7 x 1.3 cm spiculated nodule in the central right upper lobe, suspicious for a primary lung carcinoma. 2. Large number of subcentimeter nodules throughout both lungs. Some of these are irregular and could represent additional primary lung carcinomas or metastases. Some of the nodules at the lung bases are stable on abdomen CTs dating back to 2014 and others could represent metastases. 3. Mild mediastinal and right hilar adenopathy, suspicious for metastatic adenopathy. 4. No pulmonary emboli. 5. Diffuse hepatic steatosis. 6. Stable borderline splenomegaly. Electronically Signed   By: Claudie Revering M.D.   On: 06/03/2016 14:20    ASSESSMENT: Mass of upper lobe of right lung.  PLAN:    1. Mass of upper lobe of right lung: CT scan results reviewed independently and reported as above with a 2 cm spiculated nodule in the central right upper lobe. She also has mild mediastinal and right hilar lymphadenopathy. This is highly suspicious for underlying malignancy. Given the central location of this lesion, patient will benefit from a PET scan prior to biopsy to minimize complications from biopsy. A referral was sent to pulmonology for consideration of bronchoscopy. Patient will follow-up approximately one week after her biopsy to discuss the results and treatment planning if necessary.  Approximately 45 minutes was spent in discussion of which greater than 50% was consultation.   Patient expressed understanding and was in agreement with this plan. She also understands that She can call clinic at any time with any questions, concerns, or complaints.   No matching staging  information was found for the patient.  Lloyd Huger, MD   06/07/2016 1:20 PM

## 2016-06-07 NOTE — Progress Notes (Signed)
Patient is here for follow up, she is doing well, she is very nervous about the mass that was seen in the ED

## 2016-06-08 ENCOUNTER — Inpatient Hospital Stay: Payer: 59 | Admitting: Oncology

## 2016-06-09 ENCOUNTER — Encounter: Payer: Self-pay | Admitting: Pulmonary Disease

## 2016-06-09 ENCOUNTER — Telehealth: Payer: Self-pay | Admitting: Oncology

## 2016-06-12 ENCOUNTER — Other Ambulatory Visit: Payer: Self-pay | Admitting: *Deleted

## 2016-06-12 DIAGNOSIS — R918 Other nonspecific abnormal finding of lung field: Secondary | ICD-10-CM

## 2016-06-13 ENCOUNTER — Telehealth: Payer: Self-pay | Admitting: *Deleted

## 2016-06-13 NOTE — Telephone Encounter (Signed)
Called to report that her PET has been postponed which is fine, but she thought she was to have a CT scan and is confused and would like someone to call her to explain. Also wanted [place] to know her PCP put her abx for an ear infection and chest congestion. He wants her to take steroids too, bt she wants to discuss with Dr Grayland Ormond before she will take them. Please call her. [phone]

## 2016-06-13 NOTE — Telephone Encounter (Signed)
Message was sent yesterday to scheduling. They are still working on getting her CT scan scheduled. Plan is to cancel PET and order CT mid-feb and to follow up with [facility] after CT scan for results. I explained this to the patient yesterday.

## 2016-06-13 NOTE — Telephone Encounter (Signed)
Per Dr Grayland Ormond, steroids are fine to take, I spoke with pt and thento Brooke in scheduling and she will cancel PET and schedule CT then call patient

## 2016-06-14 ENCOUNTER — Telehealth: Payer: Self-pay | Admitting: *Deleted

## 2016-06-14 NOTE — Telephone Encounter (Signed)
Spoke with pt and informed her that she doesn't need to go to DR. Nadel in [place] and that Allisonia has spoke with Grayland Ormond and that pt is getting CT on 2/15 and will f/u with DK after CT. Pt agreed. Nothing further needed.

## 2016-06-15 ENCOUNTER — Ambulatory Visit: Payer: 59

## 2016-06-20 ENCOUNTER — Institutional Professional Consult (permissible substitution): Payer: 59 | Admitting: Pulmonary Disease

## 2016-07-06 ENCOUNTER — Ambulatory Visit (INDEPENDENT_AMBULATORY_CARE_PROVIDER_SITE_OTHER): Payer: 59 | Admitting: Internal Medicine

## 2016-07-06 ENCOUNTER — Ambulatory Visit: Payer: 59

## 2016-07-06 ENCOUNTER — Encounter: Payer: Self-pay | Admitting: Internal Medicine

## 2016-07-06 ENCOUNTER — Ambulatory Visit
Admission: RE | Admit: 2016-07-06 | Discharge: 2016-07-06 | Disposition: A | Discharge status: Home / Self Care | Payer: 59 | Source: Ambulatory Visit | Attending: Oncology | Admitting: Oncology

## 2016-07-06 VITALS — BP 134/82 | HR 87 | Wt 218.0 lb

## 2016-07-06 DIAGNOSIS — I2510 Atherosclerotic heart disease of native coronary artery without angina pectoris: Secondary | ICD-10-CM | POA: Insufficient documentation

## 2016-07-06 DIAGNOSIS — I700 Atherosclerosis of aorta: Secondary | ICD-10-CM | POA: Diagnosis not present

## 2016-07-06 DIAGNOSIS — E041 Nontoxic single thyroid nodule: Secondary | ICD-10-CM | POA: Diagnosis not present

## 2016-07-06 DIAGNOSIS — R918 Other nonspecific abnormal finding of lung field: Secondary | ICD-10-CM | POA: Diagnosis not present

## 2016-07-06 DIAGNOSIS — K760 Fatty (change of) liver, not elsewhere classified: Secondary | ICD-10-CM | POA: Diagnosis not present

## 2016-07-06 DIAGNOSIS — R911 Solitary pulmonary nodule: Secondary | ICD-10-CM

## 2016-07-06 MED ORDER — IOPAMIDOL (ISOVUE-300) INJECTION 61%: 75.0000 mL | Freq: Once | INTRAVENOUS | Status: DC | PRN

## 2016-07-06 NOTE — Progress Notes (Signed)
[facility] Consultation      Date: 07/06/2016,   MRN# [id number] Andrea Pollard 1955/09/28 Code Status:  Code Status History    This patient does not have a recorded code status. Please follow your organizational policy for patients in this situation.     Hosp day:@LENGTHOFSTAYDAYS @ Referring MD: @ATDPROV @     PCP:      AdmissionWeight: 218 lb (98.9 kg)                 CurrentWeight: 218 lb (98.9 kg) Andrea Pollard is a 61 y.o. old female seen in consultation for RUL nodule at the request of Dr. Oleta Mouse.     CHIEF COMPLAINT:   I have a spot in my lungs   HISTORY OF PRESENT ILLNESS  61 yo white female seen today abnormal CT chest patient has been dx with ASTHMA in her 75's She has been on albuterol as needed It has been very  well controlled over the past 10 years Former smoker 25 years ago 1 ppd for 10 years  1 month ago, she had acute ASTHMA attack Patient was treated with prednisone but did not get ABX Was sent to ER and CT chest showed RUL nodule approx 1.6 CM  I was called by Dr Oleta Mouse and discussed case and patient had repeat   CT chest today 07/06/2016 Images reviewed showed that RUL nodule has decreased in size 1.4 CM Also adenopathy has improved as well   I have also reviewed these images with patient  She has no acute issues at this time Resp status back to normal    PAST MEDICAL HISTORY   Past Medical History:  Diagnosis Date  . Asthma   . Diabetes mellitus without complication ([place])   . Hypertension      SURGICAL HISTORY   Past Surgical History:  Procedure Laterality Date  . ABDOMINAL HYSTERECTOMY    . CESAREAN SECTION    . CHOLECYSTECTOMY    . LAPAROSCOPIC GASTRIC SLEEVE RESECTION       FAMILY HISTORY   Family History  Problem Relation Age of Onset  . Stroke Father   . Ovarian cancer Maternal Grandmother      SOCIAL HISTORY   Social History  Substance Use Topics  . Smoking status: Former  Research scientist (life sciences)  . Smokeless tobacco: Never Used  . Alcohol use No     MEDICATIONS    Home Medication:  Current Outpatient Rx  . Order #: [id number] Class: Historical Med  . Order #: [id number] Class: Historical Med  . Order #: [id number] Class: Historical Med  . Order #: [id number] Class: Historical Med    Current Medication:  Current Outpatient Prescriptions:  .  albuterol (PROVENTIL HFA;VENTOLIN HFA) 108 (90 Base) MCG/ACT inhaler, Inhale 2 puffs into the lungs every 6 (six) hours as needed., Disp: , Rfl:  .  hydrochlorothiazide (HYDRODIURIL) 12.5 MG tablet, Take 12.5 mg by mouth daily., Disp: , Rfl:  .  MELATONIN PO, Take 10 mg by mouth at bedtime as needed. , Disp: , Rfl:  .  metFORMIN (GLUCOPHAGE-XR) 500 MG 24 hr tablet, Take 2 tablets by mouth 2 (two) times daily., Disp: , Rfl: 2 No current facility-administered medications for this visit.   Facility-Administered Medications Ordered in Other Visits:  .  iopamidol (ISOVUE-300) 61 % injection 75 mL, 75 mL, Intravenous, Once PRN, Lloyd Huger, MD    ALLERGIES   Bactrim [sulfamethoxazole-trimethoprim]; Ibuprofen; Levaquin [levofloxacin in d5w]; and Nitroglycerin     REVIEW OF SYSTEMS  Review of Systems  Constitutional: Negative for chills, diaphoresis, fever, malaise/fatigue and weight loss.  HENT: Negative for congestion and hearing loss.   Eyes: Negative for blurred vision and double vision.  Respiratory: Negative for cough, hemoptysis, sputum production, shortness of breath and wheezing.   Cardiovascular: Negative for chest pain, palpitations and orthopnea.  Gastrointestinal: Negative for abdominal pain, heartburn, nausea and vomiting.  Genitourinary: Negative for dysuria and urgency.  Musculoskeletal: Negative for myalgias and neck pain.  Skin: Negative for rash.  Neurological: Negative for dizziness, tingling, tremors, weakness and headaches.  Endo/Heme/Allergies: Does not bruise/bleed easily.  Psychiatric/Behavioral:  Negative for depression, substance abuse and suicidal ideas.  All other systems reviewed and are negative.    VS: BP 134/82 (BP Location: Left Arm, Cuff Size: Normal)   Pulse 87   Wt 218 lb (98.9 kg)   SpO2 98%   BMI 36.28 kg/m      PHYSICAL EXAM  Physical Exam  Constitutional: She is oriented to person, place, and time. She appears well-developed and well-nourished. No distress.  HENT:  Head: Normocephalic and atraumatic.  Mouth/Throat: No oropharyngeal exudate.  Eyes: EOM are normal. Pupils are equal, round, and reactive to light. No scleral icterus.  Neck: Normal range of motion. Neck supple.  Cardiovascular: Normal rate, regular rhythm and normal heart sounds.   No murmur heard. Pulmonary/Chest: No stridor. No respiratory distress. She has no wheezes.  Abdominal: Soft. Bowel sounds are normal.  Musculoskeletal: Normal range of motion. She exhibits no edema.  Neurological: She is alert and oriented to person, place, and time. No cranial nerve deficit.  Skin: Skin is warm. She is not diaphoretic.  Psychiatric: She has a normal mood and affect.         IMAGING    Ct Chest W Contrast  Result Date: 07/06/2016 CLINICAL DATA:  Short interval follow-up right upper lobe pulmonary nodule. Remote smoking history. EXAM: CT CHEST WITH CONTRAST TECHNIQUE: Multidetector CT imaging of the chest was performed during intravenous contrast administration. CONTRAST:  75 cc Isovue-300 IV. COMPARISON:  06/03/2016 chest CT. FINDINGS: Cardiovascular: Normal heart size. No significant pericardial fluid/thickening. Left main, left anterior descending, left circumflex and right coronary atherosclerosis. Atherosclerotic nonaneurysmal thoracic aorta. Normal caliber pulmonary arteries. No central pulmonary emboli. Mediastinum/Nodes: Partially calcified 2.2 cm right thyroid lobe nodule, stable. Unremarkable esophagus. No pathologically enlarged axillary, mediastinal or hilar lymph nodes. Previously  described mildly enlarged right hilar 1.0 cm node is decreased to 0.7 cm, within normal limits. Previously described mildly enlarged 1.0 cm right lower paratracheal node is decreased to 0.6 cm, within normal limits. Previously described mildly enlarged 1.1 cm right paratracheal node is decreased to 0.8 cm, within normal limits. Lungs/Pleura: No pneumothorax. No pleural effusion. Persistent slightly spiculated 1.8 x 1.2 cm solid central right upper lobe pulmonary nodule (series 3/ image 59), previously 1.8 x 1.3 cm on 06/03/2016 using similar measurement technique, not appreciably changed. Innumerable (> than 50) tiny poorly defined pulmonary nodules scattered throughout both lungs involving all lung lobes, generally centrilobular in distribution, largest 5 mm (series 3/ image 53 in the right upper lobe), not appreciably changed since 06/03/2016 chest CT, and not appreciably changed at the lung bases back to 04/16/2012 CT abdomen study. No acute consolidative airspace disease or appreciable new significant pulmonary nodules. 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 Diffuse hepatic steatosis. Cholecystectomy. Musculoskeletal: No aggressive appearing focal osseous lesions. Moderate thoracic spondylosis. IMPRESSION: 1. Persistent spiculated 1.8 cm solid central  right upper lobe pulmonary nodule, not appreciably changed since 06/03/2016 chest CT. Differential continues to include primary bronchogenic carcinoma. PET-CT advised for further characterization. 2. Innumerable tiny poorly defined pulmonary nodules throughout both lungs involving all lung lobes, generally centrilobular in distribution, stable since 06/03/2016 chest CT and stable at the lung bases back to a 2013 CT abdomen study, suggestive of postinflammatory nodularity. 3. Interval resolution of thoracic adenopathy. 4. Aortic atherosclerosis. Left main and 3 vessel coronary atherosclerosis.  5. Stable partially calcified 2.2 cm right thyroid lobe nodule, for which correlation with thyroid ultrasound is advised if not previously performed. This follows ACR consensus guidelines: Managing Incidental Thyroid Nodules Detected on Imaging: White Paper of the ACR Incidental Thyroid Findings Committee. J Am Coll Radiol 2015; 12:143-150. 6. Diffuse hepatic steatosis. Electronically Signed   By: Ilona Sorrel M.D.   On: 07/06/2016 09:40        ASSESSMENT/PLAN  61 yo pleasant white female with underlying ASTHMA with incidental finding of RUL nodule that has decreased in size over past 4 weeks from 1.6 CM to 1.4 Cm. Likely findings include inflammatory process.  Patient will need close follow up with CT chest in 4 weeks to assess for interval changes  I have personally obtained a history, examined the patient, evaluated laboratory and independently reviewed imaging results, formulated the assessment and plan and placed orders.  The Patient requires high complexity decision making for assessment and support, frequent evaluation and titration of therapies, application of advanced monitoring technologies and extensive interpretation of multiple databases.   Patient/Family are satisfied with Plan of action and management. All questions answered  Corrin Parker, M.D.  [facility]  Medical Director [place] Director [facility] Cardio-Pulmonary Department

## 2016-07-06 NOTE — Patient Instructions (Signed)
Follow up in 4 weeks with CT chest

## 2016-07-09 NOTE — Progress Notes (Deleted)
[place]  [phone] [phone]  ID: Andrea Pollard OB: July 11, 1955  MR#: [id number]  [id number]  Patient Care Team: Ellamae Sia, MD as PCP - General (Internal Medicine)  CHIEF COMPLAINT: Mass of upper lobe of right lung.  INTERVAL HISTORY: Patient is a 61 year old female who presented to the emergency room with increasing shortness of breath secondary to "asthma attack". Subsequent workup included a CT scan which revealed a central right upper lobe lung nodule highly suspicious for underlying malignancy. Currently, she is anxious but otherwise feels well. Her shortness of breath has improved, but still evident. She has no neurologic complaints. She denies any recent fevers. She has a good appetite and denies weight loss. She has no cough, chest pain, or hemoptysis. She denies any nausea, vomiting, constipation, or diarrhea. She has no urinary complaints. Patient otherwise feels well and offers no further specific complaints.  REVIEW OF SYSTEMS:   Review of Systems  Constitutional: Negative.  Negative for fever, malaise/fatigue and weight loss.  Respiratory: Positive for shortness of breath. Negative for cough and hemoptysis.   Cardiovascular: Negative.  Negative for chest pain and leg swelling.  Gastrointestinal: Negative.  Negative for abdominal pain.  Genitourinary: Negative.   Musculoskeletal: Negative.   Neurological: Negative.  Negative for weakness.  Psychiatric/Behavioral: The patient is nervous/anxious and has insomnia.     As per HPI. Otherwise, a complete review of systems is negative.  PAST MEDICAL HISTORY: Past Medical History:  Diagnosis Date  . Asthma   . Diabetes mellitus without complication ([place])   . Hypertension     PAST SURGICAL HISTORY: Past Surgical History:  Procedure Laterality Date  . ABDOMINAL HYSTERECTOMY    . CESAREAN SECTION    . CHOLECYSTECTOMY    . LAPAROSCOPIC GASTRIC SLEEVE RESECTION       FAMILY HISTORY: Family History  Problem Relation Age of Onset  . Stroke Father   . Ovarian cancer Maternal Grandmother     ADVANCED DIRECTIVES (Y/N):  N  HEALTH MAINTENANCE: Social History  Substance Use Topics  . Smoking status: Former Research scientist (life sciences)  . Smokeless tobacco: Never Used  . Alcohol use No     Colonoscopy:  PAP:  Bone density:  Lipid panel:  Allergies  Allergen Reactions  . Bactrim [Sulfamethoxazole-Trimethoprim]   . Ibuprofen Other (See Comments)    Gastric bypass  . Levaquin [Levofloxacin In D5w]   . Nitroglycerin     "bottoms me out"    Current Outpatient Prescriptions  Medication Sig Dispense Refill  . albuterol (PROVENTIL HFA;VENTOLIN HFA) 108 (90 Base) MCG/ACT inhaler Inhale 2 puffs into the lungs every 6 (six) hours as needed.    . hydrochlorothiazide (HYDRODIURIL) 12.5 MG tablet Take 12.5 mg by mouth daily.    Marland Kitchen MELATONIN PO Take 10 mg by mouth at bedtime as needed.     . metFORMIN (GLUCOPHAGE-XR) 500 MG 24 hr tablet Take 2 tablets by mouth 2 (two) times daily.  2   No current facility-administered medications for this visit.     OBJECTIVE: There were no vitals filed for this visit.   There is no height or weight on file to calculate BMI.    ECOG FS:0 - Asymptomatic  General: Well-developed, well-nourished, no acute distress. Eyes: Pink conjunctiva, anicteric sclera. HEENT: Normocephalic, moist mucous membranes, clear oropharnyx. Lungs: Clear to auscultation bilaterally. Heart: Regular rate and rhythm. No rubs, murmurs, or gallops. Abdomen: Soft, nontender, nondistended. No organomegaly noted, normoactive bowel sounds. Musculoskeletal: No edema, cyanosis, or clubbing.  Neuro: Alert, answering all questions appropriately. Cranial nerves grossly intact. Skin: No rashes or petechiae noted. Psych: Normal affect. Lymphatics: No cervical, calvicular, axillary or inguinal LAD.   LAB RESULTS:  Lab Results  Component Value Date   NA 143 06/03/2016    K 4.0 06/03/2016   CL 107 06/03/2016   CO2 23 06/03/2016   GLUCOSE 171 (H) 06/03/2016   BUN 18 06/03/2016   CREATININE 0.60 06/03/2016   CALCIUM 9.3 06/03/2016   PROT 7.4 03/20/2016   ALBUMIN 4.2 03/20/2016   AST 43 (H) 03/20/2016   ALT 39 03/20/2016   ALKPHOS 88 03/20/2016   BILITOT 0.6 03/20/2016   GFRNONAA >60 06/03/2016   GFRAA >60 06/03/2016    Lab Results  Component Value Date   WBC 8.8 06/03/2016   NEUTROABS 5.2 06/04/2013   HGB 14.7 06/03/2016   HCT 42.3 06/03/2016   MCV 82.9 06/03/2016   PLT 219 06/03/2016     STUDIES: Ct Chest W Contrast  Result Date: 07/06/2016 CLINICAL DATA:  Short interval follow-up right upper lobe pulmonary nodule. Remote smoking history. EXAM: CT CHEST WITH CONTRAST TECHNIQUE: Multidetector CT imaging of the chest was performed during intravenous contrast administration. CONTRAST:  75 cc Isovue-300 IV. COMPARISON:  06/03/2016 chest CT. FINDINGS: Cardiovascular: Normal heart size. No significant pericardial fluid/thickening. Left main, left anterior descending, left circumflex and right coronary atherosclerosis. Atherosclerotic nonaneurysmal thoracic aorta. Normal caliber pulmonary arteries. No central pulmonary emboli. Mediastinum/Nodes: Partially calcified 2.2 cm right thyroid lobe nodule, stable. Unremarkable esophagus. No pathologically enlarged axillary, mediastinal or hilar lymph nodes. Previously described mildly enlarged right hilar 1.0 cm node is decreased to 0.7 cm, within normal limits. Previously described mildly enlarged 1.0 cm right lower paratracheal node is decreased to 0.6 cm, within normal limits. Previously described mildly enlarged 1.1 cm right paratracheal node is decreased to 0.8 cm, within normal limits. Lungs/Pleura: No pneumothorax. No pleural effusion. Persistent slightly spiculated 1.8 x 1.2 cm solid central right upper lobe pulmonary nodule (series 3/ image 59), previously 1.8 x 1.3 cm on 06/03/2016 using similar  measurement technique, not appreciably changed. Innumerable (> than 50) tiny poorly defined pulmonary nodules scattered throughout both lungs involving all lung lobes, generally centrilobular in distribution, largest 5 mm (series 3/ image 53 in the right upper lobe), not appreciably changed since 06/03/2016 chest CT, and not appreciably changed at the lung bases back to 04/16/2012 CT abdomen study. No acute consolidative airspace disease or appreciable new significant pulmonary nodules. 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 Diffuse hepatic steatosis. Cholecystectomy. Musculoskeletal: No aggressive appearing focal osseous lesions. Moderate thoracic spondylosis. IMPRESSION: 1. Persistent spiculated 1.8 cm solid central right upper lobe pulmonary nodule, not appreciably changed since 06/03/2016 chest CT. Differential continues to include primary bronchogenic carcinoma. PET-CT advised for further characterization. 2. Innumerable tiny poorly defined pulmonary nodules throughout both lungs involving all lung lobes, generally centrilobular in distribution, stable since 06/03/2016 chest CT and stable at the lung bases back to a 2013 CT abdomen study, suggestive of postinflammatory nodularity. 3. Interval resolution of thoracic adenopathy. 4. Aortic atherosclerosis. Left main and 3 vessel coronary atherosclerosis. 5. Stable partially calcified 2.2 cm right thyroid lobe nodule, for which correlation with thyroid ultrasound is advised if not previously performed. This follows ACR consensus guidelines: Managing Incidental Thyroid Nodules Detected on Imaging: White Paper of the ACR Incidental Thyroid Findings Committee. J Am Coll Radiol 2015; 12:143-150. 6. Diffuse hepatic steatosis. Electronically Signed   By: Corene Cornea  A Poff M.D.   On: 07/06/2016 09:40    ASSESSMENT: Mass of upper lobe of right lung.  PLAN:    1. Mass of upper lobe of right lung: CT  scan results reviewed independently and reported as above with a 2 cm spiculated nodule in the central right upper lobe. She also has mild mediastinal and right hilar lymphadenopathy. This is highly suspicious for underlying malignancy. Given the central location of this lesion, patient will benefit from a PET scan prior to biopsy to minimize complications from biopsy. A referral was sent to pulmonology for consideration of bronchoscopy. Patient will follow-up approximately one week after her biopsy to discuss the results and treatment planning if necessary.  Approximately 45 minutes was spent in discussion of which greater than 50% was consultation.   Patient expressed understanding and was in agreement with this plan. She also understands that She can call clinic at any time with any questions, concerns, or complaints.   Cancer Staging No matching staging information was found for the patient.  Lloyd Huger, MD   07/09/2016 5:26 PM

## 2016-07-10 ENCOUNTER — Inpatient Hospital Stay: Payer: 59 | Admitting: Oncology

## 2016-07-20 ENCOUNTER — Inpatient Hospital Stay: Payer: 59 | Admitting: Oncology

## 2016-08-06 NOTE — Progress Notes (Signed)
[facility]  [phone] [phone]  ID: Andrea Pollard OB: Feb 04, 1956  MR#: [id number]  QIO#:[id number]  Patient Care Team: Ellamae Sia, MD as PCP - General (Internal Medicine)  CHIEF COMPLAINT: Mass of upper lobe of right lung.  INTERVAL HISTORY: Patient returns to clinic today for further evaluation and discussion of her imaging results. Imaging and evaluation were delayed secondary to insurance purposes. She is highly anxious today, but otherwise feels well. She does not complain of shortness of breath today. She has no neurologic complaints. She denies any recent fevers. She has a good appetite and denies weight loss. She has no cough, chest pain, or hemoptysis. She denies any nausea, vomiting, constipation, or diarrhea. She has no urinary complaints. Patient otherwise feels well and offers no further specific complaints.  REVIEW OF SYSTEMS:   Review of Systems  Constitutional: Negative.  Negative for fever, malaise/fatigue and weight loss.  Respiratory: Negative for cough, hemoptysis and shortness of breath.   Cardiovascular: Negative.  Negative for chest pain and leg swelling.  Gastrointestinal: Negative.  Negative for abdominal pain.  Genitourinary: Negative.   Musculoskeletal: Negative.   Neurological: Negative.  Negative for sensory change and weakness.  Psychiatric/Behavioral: The patient is nervous/anxious. The patient does not have insomnia.     As per HPI. Otherwise, a complete review of systems is negative.  PAST MEDICAL HISTORY: Past Medical History:  Diagnosis Date  . Asthma   . Complication of anesthesia   . Diabetes mellitus without complication ([place])   . GERD (gastroesophageal reflux disease)   . 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 History of methicillin resistant staphylococcus aureus (MRSA) 2013  . Hypertension   . Pneumonia 04/2016  . PONV (postoperative nausea and vomiting)    NAUSEATED  . Sleep apnea    CPAP    PAST SURGICAL HISTORY: Past Surgical History:  Procedure Laterality Date  . ABDOMINAL HYSTERECTOMY    . CESAREAN SECTION    . CHOLECYSTECTOMY    . LAPAROSCOPIC GASTRIC SLEEVE RESECTION      FAMILY HISTORY: Family History  Problem Relation Age of Onset  . Stroke Father   . Ovarian cancer Maternal Grandmother     ADVANCED DIRECTIVES (Y/N):  N  HEALTH MAINTENANCE: Social History  Substance Use Topics  . Smoking status: Former Smoker    Packs/day: 1.00    Years: 5.00    Types: Cigarettes    Quit date: 08/12/1991  . Smokeless tobacco: Never Used  . Alcohol use No     Colonoscopy:  PAP:  Bone density:  Lipid panel:  Allergies  Allergen Reactions  . Bactrim [Sulfamethoxazole-Trimethoprim] Hives  . Ibuprofen Other (See Comments)    Gastric bypass  . Levaquin [Levofloxacin In D5w] Hives  . Nitroglycerin Other (See Comments)    "bottoms me out"    Current Outpatient Prescriptions  Medication Sig Dispense Refill  . albuterol (PROVENTIL HFA;VENTOLIN HFA) 108 (90 Base) MCG/ACT inhaler Inhale 2 puffs into the lungs every 6 (six) hours as needed for wheezing or shortness of breath.     . hydrochlorothiazide (HYDRODIURIL) 12.5 MG tablet Take 12.5 mg by mouth daily.    . metFORMIN (GLUCOPHAGE-XR) 500 MG 24 hr tablet Take 2 tablets by mouth 2 (two) times daily.  2  . acetaminophen (TYLENOL) 500 MG tablet Take 1,000 mg by mouth every 6 (six) hours as needed for mild pain.    . Ca Carbonate-Mag Hydroxide (ROLAIDS PO) Take 1 tablet by mouth as  needed.    . lansoprazole (PREVACID) 15 MG capsule Take 15 mg by mouth at bedtime.    . Melatonin 10 MG TABS Take 10 mg by mouth at bedtime as needed (sleep).     No current facility-administered medications for this visit.     OBJECTIVE: Vitals:   08/08/16 1000  BP: (!) 154/108  Pulse: 91  Resp: 18  Temp: 98.3 F (36.8 C)     Body mass index is 33.54 kg/m.    ECOG FS:0 - Asymptomatic  General: Well-developed,  well-nourished, no acute distress. Eyes: Pink conjunctiva, anicteric sclera. HEENT: Normocephalic, moist mucous membranes, clear oropharnyx. Lungs: Clear to auscultation bilaterally. Heart: Regular rate and rhythm. No rubs, murmurs, or gallops. Abdomen: Soft, nontender, nondistended. No organomegaly noted, normoactive bowel sounds. Musculoskeletal: No edema, cyanosis, or clubbing. Neuro: Alert, answering all questions appropriately. Cranial nerves grossly intact. Skin: No rashes or petechiae noted. Psych: Normal affect. Lymphatics: No cervical, calvicular, axillary or inguinal LAD.   LAB RESULTS:  Lab Results  Component Value Date   NA 143 06/03/2016   K 4.0 06/03/2016   CL 107 06/03/2016   CO2 23 06/03/2016   GLUCOSE 171 (H) 06/03/2016   BUN 18 06/03/2016   CREATININE 0.60 06/03/2016   CALCIUM 9.3 06/03/2016   PROT 7.4 03/20/2016   ALBUMIN 4.2 03/20/2016   AST 43 (H) 03/20/2016   ALT 39 03/20/2016   ALKPHOS 88 03/20/2016   BILITOT 0.6 03/20/2016   GFRNONAA >60 06/03/2016   GFRAA >60 06/03/2016    Lab Results  Component Value Date   WBC 8.8 06/03/2016   NEUTROABS 5.2 06/04/2013   HGB 14.7 06/03/2016   HCT 42.3 06/03/2016   MCV 82.9 06/03/2016   PLT 219 06/03/2016     STUDIES: Nm Pet Image Restag (ps) Skull Base To Thigh  Result Date: 08/07/2016 CLINICAL DATA:  Initial treatment strategy for right lung mass. EXAM: NUCLEAR MEDICINE PET SKULL BASE TO THIGH TECHNIQUE: 12.6 mCi F-18 FDG was injected intravenously. Full-ring PET imaging was performed from the skull base to thigh after the radiotracer. CT data was obtained and used for attenuation correction and anatomic localization. FASTING BLOOD GLUCOSE:  Value: 117 mg/dl COMPARISON:  CT chest 07/06/2016 and 06/03/2016. CT abdomen pelvis 03/20/2016 and 04/16/2012. FINDINGS: NECK No hypermetabolic lymph nodes in the neck. CT images show no acute findings. Scattered opacification of the ethmoid air cells. CHEST No  hypermetabolic mediastinal, hilar or axillary lymph nodes. Spiculated right upper lobe nodule measures 12 x 14 mm and has an SUV max of 3.9. There are multiple additional scattered millimetric pulmonary nodules in the lungs bilaterally which appear to be largely peribronchovascular in distribution and too small for PET resolution. These are stable from 06/03/2016 and at the lung bases, stable from 04/16/2012. Partially calcified low-attenuation right thyroid nodule measures 2.3 cm, as before. Atherosclerotic calcification of the arterial vasculature, including coronary arteries. No pericardial or pleural effusion. Small hiatal hernia. ABDOMEN/PELVIS No abnormal hypermetabolism in the liver, adrenal glands, spleen or pancreas. No hypermetabolic lymph nodes. Liver is decreased in attenuation diffusely. Cholecystectomy. Right adrenal gland is unremarkable. Minimal nodular thickening of the left adrenal gland. Stones are seen in the right kidney. Kidneys, spleen, pancreas, stomach and bowel are otherwise grossly unremarkable. No free fluid. Atherosclerotic calcification of the arterial vasculature. SKELETON No abnormal osseous hypermetabolism. IMPRESSION: 1. Hypermetabolic spiculated right upper lobe nodule, worrisome for primary bronchogenic carcinoma. No definitive evidence of metastatic disease. 2. Scattered millimetric bilateral pulmonary nodules are in a  peribronchovascular distribution and are likely post infectious in etiology. 3.  Aortic atherosclerosis (ICD10-170.0). 4. Partially calcified right thyroid nodule. As recommended previously, consider further evaluation with thyroid ultrasound. If patient is clinically hyperthyroid, consider nuclear medicine thyroid uptake and scan. 5. Hepatic steatosis. 6. Right renal stones. Electronically Signed   By: Lorin Picket M.D.   On: 08/07/2016 10:52    ASSESSMENT: Mass of upper lobe of right lung.  PLAN:    1. Mass of upper lobe of right lung: PET scan results  reviewed independently and reported as above with a 2 cm spiculated nodule in the central right upper lobe highly suspicious for underlying malignancy. Patient reports she just received a phone call from pulmonology to pursue bronchoscopy and biopsy. Patient will follow-up approximately one week after her biopsy to discuss the results and treatment planning if necessary.  Approximately 30 minutes was spent in discussion of which greater than 50% was consultation.   Patient expressed understanding and was in agreement with this plan. She also understands that She can call clinic at any time with any questions, concerns, or complaints.   Cancer Staging No matching staging information was found for the patient.  Lloyd Huger, MD   08/13/2016 8:54 AM

## 2016-08-07 ENCOUNTER — Encounter: Payer: Self-pay | Admitting: Internal Medicine

## 2016-08-07 ENCOUNTER — Ambulatory Visit
Admission: RE | Admit: 2016-08-07 | Discharge: 2016-08-07 | Disposition: A | Discharge status: Home / Self Care | Payer: 59 | Source: Ambulatory Visit | Attending: Oncology | Admitting: Oncology

## 2016-08-07 ENCOUNTER — Ambulatory Visit: Payer: 59

## 2016-08-07 ENCOUNTER — Other Ambulatory Visit: Payer: 59

## 2016-08-07 ENCOUNTER — Ambulatory Visit (INDEPENDENT_AMBULATORY_CARE_PROVIDER_SITE_OTHER): Payer: 59 | Admitting: Internal Medicine

## 2016-08-07 VITALS — BP 132/78 | HR 69 | Ht 68.0 in | Wt 219.0 lb

## 2016-08-07 DIAGNOSIS — I700 Atherosclerosis of aorta: Secondary | ICD-10-CM | POA: Diagnosis not present

## 2016-08-07 DIAGNOSIS — E041 Nontoxic single thyroid nodule: Secondary | ICD-10-CM | POA: Diagnosis not present

## 2016-08-07 DIAGNOSIS — N200 Calculus of kidney: Secondary | ICD-10-CM | POA: Diagnosis not present

## 2016-08-07 DIAGNOSIS — J4520 Mild intermittent asthma, uncomplicated: Secondary | ICD-10-CM | POA: Diagnosis not present

## 2016-08-07 DIAGNOSIS — R918 Other nonspecific abnormal finding of lung field: Secondary | ICD-10-CM | POA: Diagnosis not present

## 2016-08-07 DIAGNOSIS — K760 Fatty (change of) liver, not elsewhere classified: Secondary | ICD-10-CM | POA: Insufficient documentation

## 2016-08-07 LAB — GLUCOSE, CAPILLARY: GLUCOSE-CAPILLARY: 117 mg/dL — AB (ref 65–99)

## 2016-08-07 MED ORDER — FLUDEOXYGLUCOSE F - 18 (FDG) INJECTION
12.6000 | Freq: Once | INTRAVENOUS | Status: AC | PRN
  Administered 2016-08-07: 12.6 via INTRAVENOUS

## 2016-08-07 NOTE — Patient Instructions (Signed)
Check PFT's Follow up PET scan results

## 2016-08-07 NOTE — Progress Notes (Signed)
[facility] Consultation      Date: 08/07/2016,   MRN# [id number] Andrea Pollard [birth year] Code Status:  Code Status History    This patient does not have a recorded code status. Please follow your organizational policy for patients in this situation.     Hosp day:@LENGTHOFSTAYDAYS @ Referring MD: @ATDPROV @     PCP:      AdmissionWeight: 219 lb (99.3 kg)                 CurrentWeight: 219 lb (99.3 kg) Andrea Pollard is a 61 y.o. old female seen in consultation for RUL nodule at the request of Dr. Oleta Mouse.     CHIEF COMPLAINT:   I have a spot in my lungs   HISTORY OF PRESENT ILLNESS  61 yo white female seen today for follow up abnormal CT chest/PET scan patient has been dx with ASTHMA in her 63's She has been on albuterol as needed It has been very  well controlled over the past 10 years Former smoker 25 years ago 1 ppd for 10 years  1 month ago, she had acute ASTHMA attack Patient was treated with prednisone but did not get ABX Was sent to ER and CT chest showed RUL nodule approx 1.6 CM  I was called by Dr Oleta Mouse and discussed case and patient had repeat   Previous CT chest  Images reviewed showed that RUL nodule has decreased in size 1.4 CM Also adenopathy has improved as well  Awaiting for PET scan results done today I have also reviewed these images with patient  She has no acute issues at this time Resp status back to normal Occasional SOB and wheezing with cough Will need PFT's     Current Outpatient Prescriptions:  .  albuterol (PROVENTIL HFA;VENTOLIN HFA) 108 (90 Base) MCG/ACT inhaler, Inhale 2 puffs into the lungs every 6 (six) hours as needed., Disp: , Rfl:  .  hydrochlorothiazide (HYDRODIURIL) 12.5 MG tablet, Take 12.5 mg by mouth daily., Disp: , Rfl:  .  MELATONIN PO, Take 10 mg by mouth at bedtime as needed. , Disp: , Rfl:  .  metFORMIN (GLUCOPHAGE-XR) 500 MG 24 hr tablet, Take 2 tablets by mouth 2 (two) times  daily., Disp: , Rfl: 2    ALLERGIES   Bactrim [sulfamethoxazole-trimethoprim]; Ibuprofen; Levaquin [levofloxacin in d5w]; and Nitroglycerin     REVIEW OF SYSTEMS   Review of Systems  Constitutional: Negative for chills, diaphoresis, fever, malaise/fatigue and weight loss.  HENT: Negative for congestion and hearing loss.   Eyes: Negative for blurred vision and double vision.  Respiratory: Negative for cough, hemoptysis, sputum production, shortness of breath and wheezing.   Cardiovascular: Negative for chest pain, palpitations and orthopnea.  Gastrointestinal: Negative for abdominal pain, heartburn, nausea and vomiting.  Genitourinary: Negative for dysuria and urgency.  Musculoskeletal: Negative for myalgias and neck pain.  Skin: Negative for rash.  Neurological: Negative for dizziness, tingling, tremors, weakness and headaches.  Endo/Heme/Allergies: Does not bruise/bleed easily.  Psychiatric/Behavioral: Negative for depression, substance abuse and suicidal ideas.  All other systems reviewed and are negative.    VS: BP 132/78 (BP Location: Left Arm, Cuff Size: Normal)   Pulse 69   Ht 5\' 8"  (1.727 m)   Wt 219 lb (99.3 kg)   SpO2 97%   BMI 33.30 kg/m      PHYSICAL EXAM  Physical Exam  Constitutional: She is oriented to person, place, and time. She appears well-developed and well-nourished. No distress.  HENT:  Head: Normocephalic and atraumatic.  Mouth/Throat: No oropharyngeal exudate.  Eyes: EOM are normal. Pupils are equal, round, and reactive to light. No scleral icterus.  Neck: Normal range of motion. Neck supple.  Cardiovascular: Normal rate, regular rhythm and normal heart sounds.   No murmur heard. Pulmonary/Chest: No stridor. No respiratory distress. She has no wheezes.  Abdominal: Soft. Bowel sounds are normal.  Musculoskeletal: Normal range of motion. She exhibits no edema.  Neurological: She is alert and oriented to person, place, and time. No cranial  nerve deficit.  Skin: Skin is warm. She is not diaphoretic.  Psychiatric: She has a normal mood and affect.         IMAGING  PET scan 08/07/16 I have Independently reviewed images of  PET scan   on 08/07/2016 Interpretation:RUL nodule, persistent, no growth, await final reports   PET scan   ASSESSMENT/PLAN  60 yo pleasant white female with underlying ASTHMA with incidental finding of RUL nodule that has decreased in size over since last OV but PET scan results opending  Previous OV assessment 1.6 CM to 1.4 Cm. Likely findings include inflammatory process. Current OV assessmnet, RUL spicluated nodule with increased SUV updatke  Will plan for ENB    Patient/Family are satisfied with Plan of action and management. All questions answered  Corrin Parker, M.D.  [facility]  Medical Director [place] Director [facility] Cardio-Pulmonary Department

## 2016-08-08 ENCOUNTER — Encounter: Payer: Self-pay | Admitting: Oncology

## 2016-08-08 ENCOUNTER — Inpatient Hospital Stay: Payer: 59 | Attending: Oncology | Admitting: Oncology

## 2016-08-08 ENCOUNTER — Encounter: Payer: Self-pay | Admitting: *Deleted

## 2016-08-08 VITALS — BP 154/108 | HR 91 | Temp 98.3°F | Resp 18 | Ht 68.0 in | Wt 220.6 lb

## 2016-08-08 DIAGNOSIS — Z8614 Personal history of Methicillin resistant Staphylococcus aureus infection: Secondary | ICD-10-CM | POA: Insufficient documentation

## 2016-08-08 DIAGNOSIS — R911 Solitary pulmonary nodule: Secondary | ICD-10-CM | POA: Diagnosis not present

## 2016-08-08 DIAGNOSIS — K219 Gastro-esophageal reflux disease without esophagitis: Secondary | ICD-10-CM | POA: Diagnosis not present

## 2016-08-08 DIAGNOSIS — E119 Type 2 diabetes mellitus without complications: Secondary | ICD-10-CM | POA: Insufficient documentation

## 2016-08-08 DIAGNOSIS — Z79899 Other long term (current) drug therapy: Secondary | ICD-10-CM | POA: Diagnosis not present

## 2016-08-08 DIAGNOSIS — Z87891 Personal history of nicotine dependence: Secondary | ICD-10-CM | POA: Insufficient documentation

## 2016-08-08 DIAGNOSIS — K760 Fatty (change of) liver, not elsewhere classified: Secondary | ICD-10-CM | POA: Insufficient documentation

## 2016-08-08 DIAGNOSIS — Z7984 Long term (current) use of oral hypoglycemic drugs: Secondary | ICD-10-CM | POA: Diagnosis not present

## 2016-08-08 DIAGNOSIS — G4730 Sleep apnea, unspecified: Secondary | ICD-10-CM | POA: Diagnosis not present

## 2016-08-08 DIAGNOSIS — R918 Other nonspecific abnormal finding of lung field: Secondary | ICD-10-CM

## 2016-08-08 DIAGNOSIS — J45909 Unspecified asthma, uncomplicated: Secondary | ICD-10-CM | POA: Diagnosis not present

## 2016-08-08 DIAGNOSIS — E041 Nontoxic single thyroid nodule: Secondary | ICD-10-CM | POA: Diagnosis not present

## 2016-08-08 DIAGNOSIS — N200 Calculus of kidney: Secondary | ICD-10-CM | POA: Diagnosis not present

## 2016-08-08 DIAGNOSIS — I10 Essential (primary) hypertension: Secondary | ICD-10-CM | POA: Diagnosis not present

## 2016-08-08 DIAGNOSIS — K449 Diaphragmatic hernia without obstruction or gangrene: Secondary | ICD-10-CM | POA: Insufficient documentation

## 2016-08-08 DIAGNOSIS — I700 Atherosclerosis of aorta: Secondary | ICD-10-CM | POA: Diagnosis not present

## 2016-08-08 NOTE — Progress Notes (Signed)
  Oncology Nurse Navigator Documentation  Navigator Location: [facility]-Med Onc (08/08/16 1100) Referral date to RadOnc/MedOnc: 06/07/16 (08/08/16 1100) )Navigator Encounter Type: Follow-up Appt (08/08/16 1100)   Abnormal Finding Date: 06/03/16 (08/08/16 1100)                 Patient Visit Type: MedOnc (08/08/16 1100) Treatment Phase: Abnormal Scans (08/08/16 1100) Barriers/Navigation Needs: No barriers at this time;No Questions;No Needs (08/08/16 1100)        Met with patient during follow up visit with Dr. Grayland Ormond to review PET scan results. Pt had no questions during follow up visit. Contact info given to patient and instructed to call if has questions. Informed patient will arrange for her next appt once we get biopsy results back. Pt verbalized understanding.                  Time Spent with Patient: 30 (08/08/16 1100)

## 2016-08-08 NOTE — Progress Notes (Signed)
She just got a call from dr Jenell Milliner about her results, she is tearful of what there is to come.

## 2016-08-11 ENCOUNTER — Encounter
Admission: RE | Admit: 2016-08-11 | Discharge: 2016-08-11 | Disposition: A | Discharge status: Home / Self Care | Payer: 59 | Source: Ambulatory Visit | Attending: Internal Medicine | Admitting: Internal Medicine

## 2016-08-11 DIAGNOSIS — E119 Type 2 diabetes mellitus without complications: Secondary | ICD-10-CM | POA: Insufficient documentation

## 2016-08-11 DIAGNOSIS — I10 Essential (primary) hypertension: Secondary | ICD-10-CM | POA: Insufficient documentation

## 2016-08-11 DIAGNOSIS — Z01818 Encounter for other preprocedural examination: Secondary | ICD-10-CM | POA: Insufficient documentation

## 2016-08-11 HISTORY — DX: Other specified postprocedural states: Z98.890

## 2016-08-11 HISTORY — DX: Sleep apnea, unspecified: G47.30

## 2016-08-11 HISTORY — DX: Nausea with vomiting, unspecified: R11.2

## 2016-08-11 HISTORY — DX: Pneumonia, unspecified organism: J18.9

## 2016-08-11 HISTORY — DX: Adverse effect of unspecified anesthetic, initial encounter: T41.45XA

## 2016-08-11 HISTORY — DX: Gastro-esophageal reflux disease without esophagitis: K21.9

## 2016-08-11 HISTORY — DX: Other complications of anesthesia, initial encounter: T88.59XA

## 2016-08-11 HISTORY — DX: Inflammatory liver disease, unspecified: K75.9

## 2016-08-11 HISTORY — DX: Personal history of Methicillin resistant Staphylococcus aureus infection: Z86.14

## 2016-08-11 NOTE — Patient Instructions (Signed)
  Your procedure is scheduled on: 08-15-16 (Tuesday) Report to Same Day Surgery 2nd floor medical mall [facility] Entrance-take elevator on left to 2nd floor.  Check in with surgery information desk.) To find out your arrival time please call [phone] between 1PM - 3PM on 08-16-16 (Monday)  Remember: Instructions that are not followed completely may result in serious medical risk, up to and including death, or upon the discretion of your surgeon and anesthesiologist your surgery may need to be rescheduled.    _x___ 1. Do not eat food or drink liquids after midnight. No gum chewing or hard candies.     __x__ 2. No Alcohol for 24 hours before or after surgery.   __x__3. No Smoking for 24 prior to surgery.   ____  4. Bring all medications with you on the day of surgery if instructed.    __x__ 5. Notify your doctor if there is any change in your medical condition     (cold, fever, infections).     Do not wear jewelry, make-up, hairpins, clips or nail polish.  Do not wear lotions, powders, or perfumes. You may wear deodorant.  Do not shave 48 hours prior to surgery. Men may shave face and neck.  Do not bring valuables to the hospital.    [facility] is not responsible for any belongings or valuables.               Contacts, dentures or bridgework may not be worn into surgery.  Leave your suitcase in the car. After surgery it may be brought to your room.  For patients admitted to the hospital, discharge time is determined by your treatment team.   Patients discharged the day of surgery will not be allowed to drive home.  You will need someone to drive you home and stay with you the night of your procedure.    Please read over the following fact sheets that you were given:   [facility] Preparing for Surgery and or MRSA Information   _x___ Take anti-hypertensive (unless it includes a diuretic), cardiac, seizure, asthma, anti-reflux and psychiatric medicines WITH A SMALL SIP OF WATER.  These include:  1. LANSOPRAZOLE (PREVACID)  2.  3.  4.  5.  6.  ____Fleets enema or Magnesium Citrate as directed.   ____ Use CHG Soap or sage wipes as directed on instruction sheet   _X___ Use inhalers on the day of surgery and bring to hospital day of surgery-USE ALBUTEROL INHALER AT [place]  _X___ Stop Metformin and Janumet 2 days prior to surgery-LAST DOSE OF ON Saturday, MARCH 24TH    ____ Take 1/2 of usual insulin dose the night before surgery and none on the morning     surgery.   _x___ Follow recommendations from Cardiologist, Pulmonologist or PCP regarding stopping Aspirin, Coumadin, Pllavix ,Eliquis, Effient, or Pradaxa, and Pletal.  X____Stop Anti-inflammatories such as Advil, Aleve, Ibuprofen, Motrin, Naproxen, Naprosyn, Goodies powders or aspirin products. OK to take Tylenol .   _x___ Stop supplements until after surgery-STOP MELATONIN NOW   _X___ Bring C-Pap to the hospital.

## 2016-08-14 ENCOUNTER — Encounter
Admission: RE | Admit: 2016-08-14 | Discharge: 2016-08-14 | Disposition: A | Discharge status: Home / Self Care | Payer: 59 | Source: Ambulatory Visit | Attending: Internal Medicine | Admitting: Internal Medicine

## 2016-08-14 DIAGNOSIS — Z01818 Encounter for other preprocedural examination: Secondary | ICD-10-CM | POA: Diagnosis not present

## 2016-08-14 DIAGNOSIS — I10 Essential (primary) hypertension: Secondary | ICD-10-CM | POA: Diagnosis not present

## 2016-08-14 DIAGNOSIS — E119 Type 2 diabetes mellitus without complications: Secondary | ICD-10-CM | POA: Diagnosis not present

## 2016-08-14 LAB — BASIC METABOLIC PANEL
ANION GAP: 7 (ref 5–15)
BUN: 14 mg/dL (ref 6–20)
CO2: 29 mmol/L (ref 22–32)
Calcium: 9 mg/dL (ref 8.9–10.3)
Chloride: 106 mmol/L (ref 101–111)
Creatinine, Ser: 0.6 mg/dL (ref 0.44–1.00)
GFR calc non Af Amer: >60 mL/min (ref >60)
GLUCOSE: 162 mg/dL — AB (ref 65–99)
POTASSIUM: 3.4 mmol/L — AB (ref 3.5–5.1)
Sodium: 142 mmol/L (ref 135–145)

## 2016-08-14 LAB — SURGICAL PCR SCREEN
MRSA, PCR: NEGATIVE
STAPHYLOCOCCUS AUREUS: NEGATIVE

## 2016-08-15 ENCOUNTER — Encounter
Admission: RE | Disposition: A | Discharge status: Home / Self Care | Payer: Self-pay | Source: Ambulatory Visit | Attending: Internal Medicine

## 2016-08-15 ENCOUNTER — Ambulatory Visit
Admission: RE | Admit: 2016-08-15 | Discharge: 2016-08-15 | Disposition: A | Discharge status: Home / Self Care | Payer: 59 | Source: Ambulatory Visit | Attending: Internal Medicine | Admitting: Internal Medicine

## 2016-08-15 ENCOUNTER — Ambulatory Visit: Payer: 59 | Admitting: Anesthesiology

## 2016-08-15 ENCOUNTER — Ambulatory Visit: Payer: 59

## 2016-08-15 DIAGNOSIS — Z87891 Personal history of nicotine dependence: Secondary | ICD-10-CM | POA: Diagnosis not present

## 2016-08-15 DIAGNOSIS — G4730 Sleep apnea, unspecified: Secondary | ICD-10-CM | POA: Diagnosis not present

## 2016-08-15 DIAGNOSIS — E119 Type 2 diabetes mellitus without complications: Secondary | ICD-10-CM | POA: Diagnosis not present

## 2016-08-15 DIAGNOSIS — Z6833 Body mass index (BMI) 33.0-33.9, adult: Secondary | ICD-10-CM | POA: Diagnosis not present

## 2016-08-15 DIAGNOSIS — I10 Essential (primary) hypertension: Secondary | ICD-10-CM | POA: Insufficient documentation

## 2016-08-15 DIAGNOSIS — J45909 Unspecified asthma, uncomplicated: Secondary | ICD-10-CM | POA: Insufficient documentation

## 2016-08-15 DIAGNOSIS — Z7984 Long term (current) use of oral hypoglycemic drugs: Secondary | ICD-10-CM | POA: Insufficient documentation

## 2016-08-15 DIAGNOSIS — R911 Solitary pulmonary nodule: Secondary | ICD-10-CM | POA: Diagnosis not present

## 2016-08-15 HISTORY — PX: ELECTROMAGNETIC NAVIGATION BROCHOSCOPY: SHX5369

## 2016-08-15 LAB — GLUCOSE, CAPILLARY
Glucose-Capillary: 139 mg/dL — ABNORMAL HIGH (ref 65–99)
Glucose-Capillary: 145 mg/dL — ABNORMAL HIGH (ref 65–99)

## 2016-08-15 SURGERY — ELECTROMAGNETIC NAVIGATION BRONCHOSCOPY
Anesthesia: General

## 2016-08-15 MED ORDER — ONDANSETRON HCL 4 MG/2ML IJ SOLN
INTRAMUSCULAR | Status: AC
Start: 2016-08-15 — End: 2016-08-15
  Filled 2016-08-15: qty 2

## 2016-08-15 MED ORDER — SUCCINYLCHOLINE CHLORIDE 20 MG/ML IJ SOLN
INTRAMUSCULAR | Status: DC | PRN
  Administered 2016-08-15: 120 mg via INTRAVENOUS

## 2016-08-15 MED ORDER — SUGAMMADEX SODIUM 200 MG/2ML IV SOLN
INTRAVENOUS | Status: DC | PRN
  Administered 2016-08-15: 199.6 mg via INTRAVENOUS

## 2016-08-15 MED ORDER — DEXAMETHASONE SODIUM PHOSPHATE 10 MG/ML IJ SOLN
INTRAMUSCULAR | Status: DC | PRN
Start: 2016-08-15 — End: 2016-08-15
  Administered 2016-08-15: 10 mg via INTRAVENOUS

## 2016-08-15 MED ORDER — PROPOFOL 10 MG/ML IV BOLUS
INTRAVENOUS | Status: AC
  Filled 2016-08-15: qty 20

## 2016-08-15 MED ORDER — PHENYLEPHRINE 40 MCG/ML (10ML) SYRINGE FOR IV PUSH (FOR BLOOD PRESSURE SUPPORT)
PREFILLED_SYRINGE | INTRAVENOUS | Status: AC
  Filled 2016-08-15: qty 10

## 2016-08-15 MED ORDER — FENTANYL CITRATE (PF) 100 MCG/2ML IJ SOLN
INTRAMUSCULAR | Status: AC
  Filled 2016-08-15: qty 2

## 2016-08-15 MED ORDER — ONDANSETRON HCL 4 MG/2ML IJ SOLN: 4.0000 mg | Freq: Once | INTRAMUSCULAR | Status: DC | PRN

## 2016-08-15 MED ORDER — MIDAZOLAM HCL 2 MG/2ML IJ SOLN
INTRAMUSCULAR | Status: DC | PRN
  Administered 2016-08-15: 2 mg via INTRAVENOUS

## 2016-08-15 MED ORDER — PROPOFOL 10 MG/ML IV BOLUS
INTRAVENOUS | Status: DC | PRN
  Administered 2016-08-15: 200 mg via INTRAVENOUS

## 2016-08-15 MED ORDER — MIDAZOLAM HCL 2 MG/2ML IJ SOLN
INTRAMUSCULAR | Status: AC
  Filled 2016-08-15: qty 2

## 2016-08-15 MED ORDER — SUCCINYLCHOLINE CHLORIDE 20 MG/ML IJ SOLN
INTRAMUSCULAR | Status: AC
  Filled 2016-08-15: qty 1

## 2016-08-15 MED ORDER — ONDANSETRON HCL 4 MG/2ML IJ SOLN
INTRAMUSCULAR | Status: DC | PRN
  Administered 2016-08-15: 4 mg via INTRAVENOUS

## 2016-08-15 MED ORDER — IPRATROPIUM-ALBUTEROL 0.5-2.5 (3) MG/3ML IN SOLN
3.0000 mL | Freq: Once | RESPIRATORY_TRACT | Status: AC
  Administered 2016-08-15: 3 mL via RESPIRATORY_TRACT

## 2016-08-15 MED ORDER — LIDOCAINE HCL (CARDIAC) 20 MG/ML IV SOLN
INTRAVENOUS | Status: DC | PRN
  Administered 2016-08-15: 100 mg via INTRAVENOUS

## 2016-08-15 MED ORDER — FENTANYL CITRATE (PF) 100 MCG/2ML IJ SOLN
INTRAMUSCULAR | Status: DC | PRN
  Administered 2016-08-15: 100 ug via INTRAVENOUS

## 2016-08-15 MED ORDER — IPRATROPIUM-ALBUTEROL 0.5-2.5 (3) MG/3ML IN SOLN: 3.0000 mL | RESPIRATORY_TRACT | Status: DC

## 2016-08-15 MED ORDER — SODIUM CHLORIDE 0.9 % IJ SOLN
INTRAMUSCULAR | Status: AC
  Filled 2016-08-15: qty 10

## 2016-08-15 MED ORDER — ROCURONIUM BROMIDE 50 MG/5ML IV SOLN
INTRAVENOUS | Status: AC
  Filled 2016-08-15: qty 1

## 2016-08-15 MED ORDER — LIDOCAINE HCL (PF) 1 % IJ SOLN
INTRAMUSCULAR | Status: AC
  Filled 2016-08-15: qty 2

## 2016-08-15 MED ORDER — ROCURONIUM BROMIDE 100 MG/10ML IV SOLN
INTRAVENOUS | Status: DC | PRN
  Administered 2016-08-15: 10 mg via INTRAVENOUS
  Administered 2016-08-15: 40 mg via INTRAVENOUS
  Administered 2016-08-15: 30 mg via INTRAVENOUS

## 2016-08-15 MED ORDER — PHENYLEPHRINE HCL 10 MG/ML IJ SOLN
INTRAMUSCULAR | Status: DC | PRN
  Administered 2016-08-15 (×2): 100 ug via INTRAVENOUS

## 2016-08-15 MED ORDER — SODIUM CHLORIDE 0.9 % IV SOLN
INTRAVENOUS | Status: DC
  Administered 2016-08-15 (×2): via INTRAVENOUS

## 2016-08-15 MED ORDER — EPHEDRINE SULFATE 50 MG/ML IJ SOLN
INTRAMUSCULAR | Status: AC
  Filled 2016-08-15: qty 1

## 2016-08-15 MED ORDER — DEXAMETHASONE SODIUM PHOSPHATE 10 MG/ML IJ SOLN
INTRAMUSCULAR | Status: AC
  Filled 2016-08-15: qty 1

## 2016-08-15 MED ORDER — IPRATROPIUM-ALBUTEROL 0.5-2.5 (3) MG/3ML IN SOLN
RESPIRATORY_TRACT | Status: AC
  Administered 2016-08-15: 3 mL via RESPIRATORY_TRACT
  Filled 2016-08-15: qty 3

## 2016-08-15 MED ORDER — SUGAMMADEX SODIUM 200 MG/2ML IV SOLN
INTRAVENOUS | Status: AC
Start: 2016-08-15 — End: 2016-08-15
  Filled 2016-08-15: qty 2

## 2016-08-15 MED ORDER — IPRATROPIUM-ALBUTEROL 0.5-2.5 (3) MG/3ML IN SOLN
RESPIRATORY_TRACT | Status: AC
  Filled 2016-08-15: qty 3

## 2016-08-15 MED ORDER — FENTANYL CITRATE (PF) 100 MCG/2ML IJ SOLN: 25.0000 ug | INTRAMUSCULAR | Status: DC | PRN

## 2016-08-15 NOTE — Anesthesia Preprocedure Evaluation (Signed)
Anesthesia Evaluation  Patient identified by MRN, date of birth, ID band Patient awake    Reviewed: Allergy & Precautions, NPO status , Patient's Chart, lab work & pertinent test results  Airway Mallampati: III       Dental  (+) Teeth Intact   Pulmonary sleep apnea , former smoker,     + decreased breath sounds      Cardiovascular hypertension, Pt. on medications  Rhythm:Regular     Neuro/Psych negative neurological ROS     GI/Hepatic GERD  ,(+) Hepatitis -, Unspecified  Endo/Other  diabetes, Type 2, Oral Hypoglycemic AgentsMorbid obesity  Renal/GU      Musculoskeletal   Abdominal (+) + obese,   Peds  Hematology   Anesthesia Other Findings   Reproductive/Obstetrics                             Anesthesia Physical Anesthesia Plan  ASA: III  Anesthesia Plan: General   Post-op Pain Management:    Induction: Intravenous  Airway Management Planned: Oral ETT  Additional Equipment:   Intra-op Plan:   Post-operative Plan: Extubation in OR  Informed Consent: I have reviewed the patients History and Physical, chart, labs and discussed the procedure including the risks, benefits and alternatives for the proposed anesthesia with the patient or authorized representative who has indicated his/her understanding and acceptance.     Plan Discussed with: CRNA  Anesthesia Plan Comments:         Anesthesia Quick Evaluation

## 2016-08-15 NOTE — Anesthesia Post-op Follow-up Note (Cosign Needed)
Anesthesia QCDR form completed.        

## 2016-08-15 NOTE — Anesthesia Postprocedure Evaluation (Signed)
Anesthesia Post Note  Patient: Andrea Pollard  Procedure(s) Performed: Procedure(s) (LRB): ELECTROMAGNETIC NAVIGATION BRONCHOSCOPY (N/A)  Patient location during evaluation: PACU Anesthesia Type: General Level of consciousness: awake Pain management: pain level controlled Vital Signs Assessment: post-procedure vital signs reviewed and stable Respiratory status: spontaneous breathing Cardiovascular status: stable Anesthetic complications: no     Last Vitals:  Vitals:   08/15/16 0811 08/15/16 1017  BP: (!) 173/81 (!) 130/92  Pulse: 74 (!) 107  Resp: 18 20  Temp: 36.5 C (!) 36.1 C    Last Pain:  Vitals:   08/15/16 0811  TempSrc: Oral                 VAN STAVEREN,Desarie Feild

## 2016-08-15 NOTE — Transfer of Care (Signed)
Immediate Anesthesia Transfer of Care Note  Patient: Andrea Pollard  Procedure(s) Performed: Procedure(s): ELECTROMAGNETIC NAVIGATION BRONCHOSCOPY (N/A)  Patient Location: PACU  Anesthesia Type:General  Level of Consciousness: awake, alert  and oriented  Airway & Oxygen Therapy: Patient Spontanous Breathing and Patient connected to face mask oxygen  Post-op Assessment: Report given to RN and Post -op Vital signs reviewed and stable  Post vital signs: Reviewed and stable  Last Vitals:  Vitals:   08/15/16 0811 08/15/16 1017  BP: (!) 173/81 (!) 130/92  Pulse: 74 (!) 107  Resp: 18 20  Temp: 36.5 C (!) 36.1 C    Last Pain:  Vitals:   08/15/16 0811  TempSrc: Oral         Complications: No apparent anesthesia complications

## 2016-08-15 NOTE — Anesthesia Procedure Notes (Signed)
Procedure Name: Intubation Date/Time: 08/15/2016 9:13 AM Performed by: Nelda Marseille Pre-anesthesia Checklist: Patient identified, Patient being monitored, Timeout performed, Emergency Drugs available and Suction available Patient Re-evaluated:Patient Re-evaluated prior to inductionOxygen Delivery Method: Circle system utilized Preoxygenation: Pre-oxygenation with 100% oxygen Intubation Type: IV induction Ventilation: Mask ventilation without difficulty Laryngoscope Size: 3 and McGraph Grade View: Grade III Tube type: Oral Tube size: 8.5 mm Number of attempts: 1 Airway Equipment and Method: Stylet and Video-laryngoscopy Placement Confirmation: ETT inserted through vocal cords under direct vision,  positive ETCO2 and breath sounds checked- equal and bilateral Secured at: 21 cm Tube secured with: Tape Dental Injury: Teeth and Oropharynx as per pre-operative assessment  Difficulty Due To: Difficulty was anticipated, Difficult Airway- due to limited oral opening, Difficult Airway- due to large tongue and Difficult Airway- due to reduced neck mobility Future Recommendations: Recommend- induction with short-acting agent, and alternative techniques readily available

## 2016-08-15 NOTE — Op Note (Addendum)
Electromagnetic Navigation Bronchoscopy: Indication: RUL Nodule  Preoperative Diagnosis:RUl NODULE Post Procedure Diagnosis:RUL NODULE Consent: verbal/written Risks and benefits explained in detail including risk of infection, bleeding, respiratory failure and death.   Hand washing performed prior to starting the procedure.   Type of Anesthesia: see Anesthesiology records .   Procedure Performed:  Virtual Bronchoscopy with Multi-planar Image analysis, 3-D reconstruction of coronal, sagittal and multi-planar images for the purposes of planning real-time bronchoscopy using the iLogic Electromagnetic Navigation Bronchoscopy System (superDimension)..   Description of Procedure: After obtaining informed consent from the patient, the above sedative and anesthetic measures were carried out, flexible fiberoptic bronchoscope was inserted via an oral bite block. Posterior pharynx was clear. The 2 vocal cords were easily traversed after application of local anesthetic.  The virtual camera was then placed into the central portion of the trachea. The trachea itself was inspected.  The main carina, right and left midstem bronchus and all the segmental and subsegmental airways by virtual bronchoscopy were brieftly inspected.  The camera was directed to standard registration points at the following centers: main carina, right upper lobe bronchus, right lower lobe bronchus, right middle lobe bronchus, left upper lobe bronchus, and the left lower lobe bronchus. This data was transferred to the i-Logic ENB system for real-time bronchoscopy.   After navigating to RUL nodule, I then advanced needles for tissue sampling use fluoroscopy in real time to obtain samples.   Specimans Obtained:  TRANSBRONCHIAL Fine Needle Aspirations 21G times: 8   Fluoroscopy:  Fluoroscopy was utilized during the course of this procedure to assure that biopsies were taken in a safe manner under fluoroscopic guidance with no spot films  required.   Complications:none  Estimated Blood Loss: minimal less than 1 ml  Monitoring:  The patient was monitored with continuous oximetry and received supplemental nasal cannula oxygen throughout the procedure. In addition, serial blood pressure measurements and continuous electrocardiography showed these physiologic parameters to remain tolerable throughout the procedure.   Assessment and Plan/Additional Comments:follow up path reports    Corrin Parker, M.D.  [facility]  Medical Director [place] Director [facility] Cardio-Pulmonary Department

## 2016-08-15 NOTE — Discharge Instructions (Signed)

## 2016-08-15 NOTE — H&P (View-Only) (Signed)
[facility] Consultation      Date: 08/07/2016,   MRN# [id number] Andrea Pollard 03/11/1956 Code Status:  Code Status History    This patient does not have a recorded code status. Please follow your organizational policy for patients in this situation.     Hosp day:@LENGTHOFSTAYDAYS @ Referring MD: @ATDPROV @     PCP:      AdmissionWeight: 219 lb (99.3 kg)                 CurrentWeight: 219 lb (99.3 kg) Andrea Pollard is a 61 y.o. old female seen in consultation for RUL nodule at the request of Dr. Oleta Mouse.     CHIEF COMPLAINT:   I have a spot in my lungs   HISTORY OF PRESENT ILLNESS  62 yo white female seen today for follow up abnormal CT chest/PET scan patient has been dx with ASTHMA in her 44's She has been on albuterol as needed It has been very  well controlled over the past 10 years Former smoker 25 years ago 1 ppd for 10 years  1 month ago, she had acute ASTHMA attack Patient was treated with prednisone but did not get ABX Was sent to ER and CT chest showed RUL nodule approx 1.6 CM  I was called by Dr Oleta Mouse and discussed case and patient had repeat   Previous CT chest  Images reviewed showed that RUL nodule has decreased in size 1.4 CM Also adenopathy has improved as well  Awaiting for PET scan results done today I have also reviewed these images with patient  She has no acute issues at this time Resp status back to normal Occasional SOB and wheezing with cough Will need PFT's     Current Outpatient Prescriptions:  .  albuterol (PROVENTIL HFA;VENTOLIN HFA) 108 (90 Base) MCG/ACT inhaler, Inhale 2 puffs into the lungs every 6 (six) hours as needed., Disp: , Rfl:  .  hydrochlorothiazide (HYDRODIURIL) 12.5 MG tablet, Take 12.5 mg by mouth daily., Disp: , Rfl:  .  MELATONIN PO, Take 10 mg by mouth at bedtime as needed. , Disp: , Rfl:  .  metFORMIN (GLUCOPHAGE-XR) 500 MG 24 hr tablet, Take 2 tablets by mouth 2 (two) times  daily., Disp: , Rfl: 2    ALLERGIES   Bactrim [sulfamethoxazole-trimethoprim]; Ibuprofen; Levaquin [levofloxacin in d5w]; and Nitroglycerin     REVIEW OF SYSTEMS   Review of Systems  Constitutional: Negative for chills, diaphoresis, fever, malaise/fatigue and weight loss.  HENT: Negative for congestion and hearing loss.   Eyes: Negative for blurred vision and double vision.  Respiratory: Negative for cough, hemoptysis, sputum production, shortness of breath and wheezing.   Cardiovascular: Negative for chest pain, palpitations and orthopnea.  Gastrointestinal: Negative for abdominal pain, heartburn, nausea and vomiting.  Genitourinary: Negative for dysuria and urgency.  Musculoskeletal: Negative for myalgias and neck pain.  Skin: Negative for rash.  Neurological: Negative for dizziness, tingling, tremors, weakness and headaches.  Endo/Heme/Allergies: Does not bruise/bleed easily.  Psychiatric/Behavioral: Negative for depression, substance abuse and suicidal ideas.  All other systems reviewed and are negative.    VS: BP 132/78 (BP Location: Left Arm, Cuff Size: Normal)   Pulse 69   Ht 5\' 8"  (1.727 m)   Wt 219 lb (99.3 kg)   SpO2 97%   BMI 33.30 kg/m      PHYSICAL EXAM  Physical Exam  Constitutional: She is oriented to person, place, and time. She appears well-developed and well-nourished. No distress.  HENT:  Head: Normocephalic and atraumatic.  Mouth/Throat: No oropharyngeal exudate.  Eyes: EOM are normal. Pupils are equal, round, and reactive to light. No scleral icterus.  Neck: Normal range of motion. Neck supple.  Cardiovascular: Normal rate, regular rhythm and normal heart sounds.   No murmur heard. Pulmonary/Chest: No stridor. No respiratory distress. She has no wheezes.  Abdominal: Soft. Bowel sounds are normal.  Musculoskeletal: Normal range of motion. She exhibits no edema.  Neurological: She is alert and oriented to person, place, and time. No cranial  nerve deficit.  Skin: Skin is warm. She is not diaphoretic.  Psychiatric: She has a normal mood and affect.         IMAGING  PET scan 08/07/16 I have Independently reviewed images of  PET scan   on 08/07/2016 Interpretation:RUL nodule, persistent, no growth, await final reports   PET scan   ASSESSMENT/PLAN  37 yo pleasant white female with underlying ASTHMA with incidental finding of RUL nodule that has decreased in size over since last OV but PET scan results opending  Previous OV assessment 1.6 CM to 1.4 Cm. Likely findings include inflammatory process. Current OV assessmnet, RUL spicluated nodule with increased SUV updatke  Will plan for ENB    Patient/Family are satisfied with Plan of action and management. All questions answered  Corrin Parker, M.D.  [facility]  Medical Director [place] Director [facility] Cardio-Pulmonary Department

## 2016-08-15 NOTE — Interval H&P Note (Signed)
History and Physical Interval Note:  08/15/2016 8:38 AM  Andrea Pollard  has presented today for surgery, with the diagnosis of LUNG NODULE   The various methods of treatment have been discussed with the patient and family. After consideration of risks, benefits and other options for treatment, the patient has consented to  Procedure(s): ELECTROMAGNETIC NAVIGATION BRONCHOSCOPY (N/A) as a surgical intervention .  The patient's history has been reviewed, patient examined, no change in status, stable for surgery.  I have reviewed the patient's chart and labs.  Questions were answered to the patient's satisfaction.     Flora Lipps

## 2016-08-16 ENCOUNTER — Encounter: Payer: Self-pay | Admitting: Internal Medicine

## 2016-08-16 ENCOUNTER — Telehealth: Payer: Self-pay | Admitting: Internal Medicine

## 2016-08-16 LAB — CYTOLOGY - NON PAP

## 2016-08-16 NOTE — Telephone Encounter (Signed)
Pt states she hasn't seen any blood but she does get a "bloody taste" at times. Informed pt the cough could be a little irritation from the procedure. Please advise on message below and any suggestions. Thanks.

## 2016-08-16 NOTE — Telephone Encounter (Signed)
Pt states she is still coughing a lot and a "bloody taste" . She thinks this may be from her procedure yesterday, but wants to make sure. Please call, she gives ok to leave a detailed message, or call on work #,.

## 2016-08-17 ENCOUNTER — Encounter: Payer: Self-pay | Admitting: *Deleted

## 2016-08-17 ENCOUNTER — Other Ambulatory Visit: Payer: Self-pay | Admitting: *Deleted

## 2016-08-17 DIAGNOSIS — R911 Solitary pulmonary nodule: Secondary | ICD-10-CM

## 2016-08-18 NOTE — Progress Notes (Signed)
  Oncology Nurse Navigator Documentation  Navigator Location: [facility]Med Onc (08/18/16 1100)   )Navigator Encounter Type: Telephone (08/18/16 1100)                     Patient Visit Type: MedOnc (08/18/16 1100)   Barriers/Navigation Needs: Coordination of Care (08/18/16 1100)   Interventions: Referrals;Coordination of Care (08/18/16 1100) Referrals: Other (cardiothoracic surgery) (08/18/16 1100) Coordination of Care: Appts (08/18/16 1100)         Pt called to get results from recent biopsy. Biopsy results were negative. Per Dr. Grayland Ormond, pt can have close follow up with scans, repeat biopsy, or be referred to Dr. Genevive Bi for eval for resection. Pt stated that she would like it taken out and would like to get an appt to see Dr. Genevive Bi. Referral entered by Dr. Zoila Shutter office already and pt will be scheduled for PFT's prior to appt with Dr. Genevive Bi. Informed pt that scheduling will notify her with her appts. Pt verbalized understanding.         Time Spent with Patient: 30 (08/18/16 1100)

## 2016-08-18 NOTE — Telephone Encounter (Signed)
This is not unusual after bronchoscopy. Let DK know next week  Andrea Pollard

## 2016-08-21 NOTE — Telephone Encounter (Signed)
Pt informed and states it went away over the weekend. She asked about the results and I informed her that once you received them we would give her a call back. Nothing further needed at this time.

## 2016-08-22 ENCOUNTER — Inpatient Hospital Stay: Payer: 59 | Admitting: Oncology

## 2016-08-24 ENCOUNTER — Ambulatory Visit: Admit: 2016-08-24 | Payer: 59 | Admitting: Internal Medicine

## 2016-08-24 ENCOUNTER — Telehealth: Payer: Self-pay | Admitting: Internal Medicine

## 2016-08-24 ENCOUNTER — Ambulatory Visit: Payer: 59 | Attending: Oncology

## 2016-08-24 DIAGNOSIS — J449 Chronic obstructive pulmonary disease, unspecified: Secondary | ICD-10-CM | POA: Diagnosis not present

## 2016-08-24 DIAGNOSIS — R911 Solitary pulmonary nodule: Secondary | ICD-10-CM

## 2016-08-24 SURGERY — ELECTROMAGNETIC NAVIGATION BRONCHOSCOPY
Anesthesia: General

## 2016-08-24 MED ORDER — ALBUTEROL SULFATE (2.5 MG/3ML) 0.083% IN NEBU
2.5000 mg | INHALATION_SOLUTION | Freq: Once | RESPIRATORY_TRACT | Status: AC
  Administered 2016-08-24: 2.5 mg via RESPIRATORY_TRACT
  Filled 2016-08-24: qty 3

## 2016-08-24 NOTE — Telephone Encounter (Signed)
Pt has some questions about her biopsy report. Pt is aware that you are out of the office until Monday 08/28/16.  Andrea Pollard  Pt has appointment to see Dr. Genevive Bi this Friday 08/25/16 at 8:30. Just FYI for you. Andrea Pollard

## 2016-08-25 ENCOUNTER — Ambulatory Visit (INDEPENDENT_AMBULATORY_CARE_PROVIDER_SITE_OTHER): Payer: 59 | Admitting: Cardiothoracic Surgery

## 2016-08-25 ENCOUNTER — Encounter: Payer: Self-pay | Admitting: Cardiothoracic Surgery

## 2016-08-25 ENCOUNTER — Telehealth: Payer: Self-pay | Admitting: *Deleted

## 2016-08-25 VITALS — BP 133/85 | HR 85 | Temp 98.2°F | Resp 15 | Ht 68.0 in | Wt 217.0 lb

## 2016-08-25 DIAGNOSIS — R918 Other nonspecific abnormal finding of lung field: Secondary | ICD-10-CM | POA: Diagnosis not present

## 2016-08-25 NOTE — Telephone Encounter (Signed)
Thank you.  I can see her after she she Dr. Genevive Bi to answer any questions she will have after his appt.

## 2016-08-25 NOTE — Telephone Encounter (Signed)
I can see her once I get back from [place] if needed.  Andrea Pollard, if you talk with her let me know what I can do.  Thanks.

## 2016-08-25 NOTE — Progress Notes (Signed)
Patient ID: Andrea Pollard, female   DOB: February 06, 1956, 61 y.o.   MRN: [id number]  Chief Complaint  Patient presents with  . Other     Lung resection referred by cancer center    Referred By Dr. Mortimer Fries Reason for Referral right upper lobe nodule  HPI Location, Quality, Duration, Severity, Timing, Context, Modifying Factors, Associated Signs and Symptoms.  Andrea Pollard is a 61 y.o. female.  She has a history of asthma and a very remote history of smoking. She states she smoked for about 10 years a pack per day but quit 25 years ago. She has a history of asthma and recurrent episodes of bronchitis. In January she suffered an episode of shortness of breath and tightness in her throat and was having a typical asthma attack which did not respond to her inhalers and nebulizers and she went to the urgent care center. They sent her to the emergency room and [facility] where she was given steroids which resulted in improvement in her symptoms. She did have a chest CT scan done which revealed multiple bilateral pulmonary nodules that a dominant nodule in the right upper lobe medially next to the azygous vein. She then had a repeat CT scan of the chest which showed no change in the nodules and a PET scan showing slight uptake in the right upper lobe nodule. She had pulmonary function studies done revealing an FEV1 and DLCO between 70 and 80%. She had a navigational bronchoscopy performed by Dr. Mortimer Fries and this was negative for malignancy only showing some inflammation. Patient is here today to get my opinion regarding her options. She does get short of breath with exertion. She's had a gastric bypass about 2 years ago and has lost 80 pounds. She's able to walk a flight of stairs but does get quite short of breath with that. She has not had any fevers, chills or hemoptysis. She states her weight has been fairly stable and her appetite is been stable as well.   Past Medical History:  Diagnosis Date  . Asthma   .  Complication of anesthesia   . Diabetes mellitus without complication ([place])   . GERD (gastroesophageal reflux disease)   . 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 History of methicillin resistant staphylococcus aureus (MRSA) 2013  . Hypertension   . Pneumonia 04/2016  . PONV (postoperative nausea and vomiting)    NAUSEATED  . Sleep apnea    CPAP    Past Surgical History:  Procedure Laterality Date  . ABDOMINAL HYSTERECTOMY    . CESAREAN SECTION    . CHOLECYSTECTOMY    . ELECTROMAGNETIC NAVIGATION BROCHOSCOPY N/A 08/15/2016   Procedure: ELECTROMAGNETIC NAVIGATION BRONCHOSCOPY;  Surgeon: Flora Lipps, MD;  Location: [facility] ORS;  Service: Cardiopulmonary;  Laterality: N/A;  . LAPAROSCOPIC GASTRIC SLEEVE RESECTION      Family History  Problem Relation Age of Onset  . Stroke Father   . Ovarian cancer Maternal Grandmother     Social History Social History  Substance Use Topics  . Smoking status: Former Smoker    Packs/day: 1.00    Years: 5.00    Types: Cigarettes    Quit date: 08/12/1991  . Smokeless tobacco: Never Used  . Alcohol use No    Allergies  Allergen Reactions  . Bactrim [Sulfamethoxazole-Trimethoprim] Hives  . Ibuprofen Other (See Comments)    Gastric bypass  . Levaquin [Levofloxacin In D5w] Hives  . Nitroglycerin Other (See Comments)    "bottoms me out"  Current Outpatient Prescriptions  Medication Sig Dispense Refill  . acetaminophen (TYLENOL) 500 MG tablet Take 1,000 mg by mouth every 6 (six) hours as needed for mild pain.    Marland Kitchen albuterol (PROVENTIL HFA;VENTOLIN HFA) 108 (90 Base) MCG/ACT inhaler Inhale 2 puffs into the lungs every 6 (six) hours as needed for wheezing or shortness of breath.     . Ca Carbonate-Mag Hydroxide (ROLAIDS PO) Take 1 tablet by mouth as needed.    . hydrochlorothiazide (HYDRODIURIL) 12.5 MG tablet Take 12.5 mg by mouth daily.    . lansoprazole (PREVACID) 15 MG capsule Take 15 mg by mouth at bedtime.    . Melatonin 10 MG TABS Take 10  mg by mouth at bedtime as needed (sleep).    . metFORMIN (GLUCOPHAGE-XR) 500 MG 24 hr tablet Take 2 tablets by mouth 2 (two) times daily.  2   No current facility-administered medications for this visit.       Review of Systems A complete review of systems was asked and was negative except for the following positive findings Cough, shortness of breath, wheezing, joint pain.  Blood pressure 133/85, pulse 85, temperature 98.2 F (36.8 C), temperature source Oral, resp. rate 15, height 5\' 8"  (1.727 m), weight 217 lb (98.4 kg), SpO2 99 %.  Physical Exam CONSTITUTIONAL:  Pleasant, well-developed, well-nourished, and in no acute distress. EYES: Pupils equal and reactive to light, Sclera non-icteric EARS, NOSE, MOUTH AND THROAT:  The oropharynx was clear.  Dentition is good repair.  Oral mucosa pink and moist. LYMPH NODES:  Lymph nodes in the neck and axillae were normal RESPIRATORY:  Lungs were clear.  Normal respiratory effort without pathologic use of accessory muscles of respiration CARDIOVASCULAR: Heart was regular without murmurs.  There were no carotid bruits. GI: The abdomen was soft, nontender, and nondistended. There were no palpable masses. There was no hepatosplenomegaly. There were normal bowel sounds in all quadrants. GU:  Rectal deferred.   MUSCULOSKELETAL:  Normal muscle strength and tone.  No clubbing or cyanosis.   SKIN:  There were no pathologic skin lesions.  There were no nodules on palpation. NEUROLOGIC:  Sensation is normal.  Cranial nerves are grossly intact. PSYCH:  Oriented to person, place and time.  Mood and affect are normal.  Data Reviewed Multiple CT scans and PET scans  I have personally reviewed the patient's imaging, laboratory findings and medical records.    Assessment    This PET scan and CT scan show a 2 cm right upper lobe nodule. It has been unchanged over the 2 months that she's been imaged. The patient has no prior CT scans of the chest for  comparison.    Plan    I reviewed with the patient great detail the indications and risks of right thoracotomy and right upper lobectomy. We discussed the other options. She understands that at the present time we do not have a definitive diagnosis of a malignancy. Because of her significant history of asthma and her ongoing recovery from her gastric bypass she is somewhat reluctant to pursue aggressive surgical intervention without a specific diagnosis. The lesion is probably not amenable to percutaneous biopsy. She does have extensive coronary calcifications present on her CT scan and she would like to follow-up with Dr. Kingsley Spittle for further assessment of that. She seems to be asymptomatic at this time from her coronaries however. The patient would like to come back in 6 weeks' time to have an repeat chest CT. She scheduled to follow-up  with Dr. Mortimer Fries several weeks after that. All her questions were answered. She understands that if this were a malignancy that surgical intervention offers the best chance secured and delay may impair ultimate survival. She also understands the risks of surgery including those of bleeding, infection air leak and death. After extensive discussion she is elected to come back in 6 weeks with a repeat CT of the chest.       Nestor Lewandowsky, MD 08/25/2016, 9:46 AM

## 2016-08-25 NOTE — Patient Instructions (Addendum)
I will call you once I schedule your CT Scan. It will be scheduled at [facility] per your request.

## 2016-08-25 NOTE — Telephone Encounter (Signed)
Called patient with no answer, left voicemail for patient to call me back.

## 2016-08-25 NOTE — Addendum Note (Signed)
Addended by: Wayna Chalet on: 08/25/2016 10:17 AM   Modules accepted: Orders

## 2016-08-25 NOTE — Telephone Encounter (Signed)
Spoke with patient and she is agreeable to wait for repeat CT scan in 6 weeks which is ordered by Dr. Genevive Bi. She will follow up with Dr. Genevive Bi regarding results and determine at that time if needs lung resection. Pt was worried that couldn't wait 6 weeks if this is a type of lung cancer. Reassured patient that waiting 6 weeks would not be detrimental and surgery could still be an option for her if the CT reveals the nodule increasing in size. Pt verbalized understanding.

## 2016-08-25 NOTE — Telephone Encounter (Signed)
Called asking to have Dr Grayland Ormond or Haleycall her back, she is "terrified" after seeing Dr Genevive Bi today. She reports that he is talking about taking 25% of her lung out vs just waiting. She reports that he wants her to make a decision and would like to speak with Dr Grayland Ormond or Hildred Alamin regarding this. Please return call [phone]

## 2016-09-23 NOTE — Progress Notes (Signed)
Cardiology Office Note  Date:  09/27/2016   ID:  Andrea Pollard, DOB 06-Oct-1955, MRN [id number]  PCP:  Ellamae Sia, MD   Chief Complaint  Patient presents with  . other    np/self referral/blockage shown on PET scan per Dr. Mortimer Fries. Pt denies cp/sob- states she may have lung cancer. Review meds with pt verbally.    HPI:  Andrea Pollard is a 61 y.o. woman  history of  Poorly controlled diabetes, HBA1C 9 gastric bypass about 2 years ago and has lost 80 pounds.  asthma  smoked for about 10 years a pack per day but quit 25 years ago.   episodes of bronchitis. episode of shortness of breath and tightness in her throat  Who presents by referral from Dr. Grayland Ormond for consultation of her coronary disease seen on CT scan/PET scan  She reports a long history of smoking but quit 25 years ago Felt her diabetes was improved but review of recent numbers shows hemoglobin A1c of 9 Recently started on Lipitor for hyperlipidemia  Recent imaging performed for lung nodule (Hypermetabolic spiculated right upper lobe nodule, worrisome for primary bronchogenic carcinoma.) Several CT scans reviewed on today's visit including PET scan  08/07/16 This shows significant coronary calcifications predominantly in the proximal LAD Images reviewed with her in detail  She reports having some shortness of breath on exertion No regular exercise program   pulmonary function studies done revealing an FEV1 and DLCO between 70 and 80%.   navigational bronchoscopy performed by Dr. Mortimer Fries and this was negative for malignancy only showing some inflammation.   EKG personally reviewed by myself on todays visit Shows normal sinus rhythm with no significant ST or T-wave changes   PMH:   has a past medical history of Asthma; Complication of anesthesia; Diabetes mellitus without complication ([place]); GERD (gastroesophageal reflux disease); Hepatitis; History of methicillin resistant staphylococcus aureus (MRSA) (2013);  Hypertension; Pneumonia (04/2016); PONV (postoperative nausea and vomiting); and Sleep apnea.  PSH:    Past Surgical History:  Procedure Laterality Date  . ABDOMINAL HYSTERECTOMY    . CESAREAN SECTION    . CHOLECYSTECTOMY    . ELECTROMAGNETIC NAVIGATION BROCHOSCOPY N/A 08/15/2016   Procedure: ELECTROMAGNETIC NAVIGATION BRONCHOSCOPY;  Surgeon: Flora Lipps, MD;  Location: [facility] ORS;  Service: Cardiopulmonary;  Laterality: N/A;  . LAPAROSCOPIC GASTRIC SLEEVE RESECTION      Current Outpatient Prescriptions  Medication Sig Dispense Refill  . acetaminophen (TYLENOL) 500 MG tablet Take 1,000 mg by mouth every 6 (six) hours as needed for mild pain.    Marland Kitchen albuterol (PROVENTIL HFA;VENTOLIN HFA) 108 (90 Base) MCG/ACT inhaler Inhale 2 puffs into the lungs every 6 (six) hours as needed for wheezing or shortness of breath.     Marland Kitchen atorvastatin (LIPITOR) 80 MG tablet Take 80 mg by mouth daily.  2  . Ca Carbonate-Mag Hydroxide (ROLAIDS PO) Take 1 tablet by mouth as needed.    . hydrochlorothiazide (HYDRODIURIL) 12.5 MG tablet Take 12.5 mg by mouth daily.    . lansoprazole (PREVACID) 15 MG capsule Take 15 mg by mouth at bedtime.    . Melatonin 10 MG TABS Take 10 mg by mouth at bedtime as needed (sleep).    . metFORMIN (GLUCOPHAGE-XR) 500 MG 24 hr tablet Take 2 tablets by mouth 2 (two) times daily.  2   No current facility-administered medications for this visit.      Allergies:   Bactrim [sulfamethoxazole-trimethoprim]; Ibuprofen; Levaquin [levofloxacin in d5w]; and Nitroglycerin   Social History:  The patient  reports that she quit smoking about 25 years ago. Her smoking use included Cigarettes. She has a 5.00 pack-year smoking history. She has never used smokeless tobacco. She reports that she does not drink alcohol or use drugs.   Family History:   family history includes Ovarian cancer in her maternal grandmother; Stroke in her father.    Review of Systems: Review of Systems  Constitutional:  Negative.   Respiratory: Positive for shortness of breath.   Cardiovascular: Negative.        Occasional chest tightness  Gastrointestinal: Negative.   Musculoskeletal: Negative.   Neurological: Negative.   Psychiatric/Behavioral: Negative.   All other systems reviewed and are negative.    PHYSICAL EXAM: VS:  BP 120/74 (BP Location: Right Arm, Patient Position: Sitting, Cuff Size: Normal)   Pulse 85   Ht 5\' 5"  (1.651 m)   Wt 220 lb (99.8 kg)   BMI 36.61 kg/m  , BMI Body mass index is 36.61 kg/m. GEN: Well nourished, well developed, in no acute distress , obese HEENT: normal  Neck: no JVD, carotid bruits, or masses Cardiac: RRR; no murmurs, rubs, or gallops,no edema  Respiratory:  clear to auscultation bilaterally, normal work of breathing GI: soft, nontender, nondistended, + BS MS: no deformity or atrophy  Skin: warm and dry, no rash Neuro:  Strength and sensation are intact Psych: euthymic mood, full affect    Recent Labs: 03/20/2016: ALT 39 06/03/2016: Hemoglobin 14.7; Platelets 219 08/14/2016: BUN 14; Creatinine, Ser 0.60; Potassium 3.4; Sodium 142    Lipid Panel No results found for: CHOL, HDL, LDLCALC, TRIG    Wt Readings from Last 3 Encounters:  09/27/16 220 lb (99.8 kg)  08/25/16 217 lb (98.4 kg)  08/15/16 220 lb (99.8 kg)       ASSESSMENT AND PLAN:  Coronary artery disease of native artery of native heart with stable angina pectoris ([place]) -  Long discussion with her concerning recent coronary calcifications seen on CT scans  She does have some shortness of breath, occasional chest tightness Unable to treadmill given arthritis Myoview has been ordered, lexiscan We will call her with the results of her stress test We have recommended she take low-dose aspirin, stay on her statin  Morbid obesity ([place]) -  We have encouraged continued exercise, careful diet management in an effort to lose weight.  Solitary pulmonary nodule -  Followed by Dr. 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 Grayland Ormond and will be referred back for ongoing care of the medical issues detailed above   Total encounter time more than 60 minutes  Greater than 50% was spent in counseling and coordination of care with the patient    Orders Placed This Encounter  Procedures  . NM Myocar Multi W/Spect W/Wall Motion / EF  . EKG 12-Lead     Signed, Esmond Plants, M.D., Ph.D. 09/27/2016  [facility] [place], [place] [phone]

## 2016-09-27 ENCOUNTER — Encounter: Payer: Self-pay | Admitting: Cardiovascular Disease

## 2016-09-27 ENCOUNTER — Ambulatory Visit (INDEPENDENT_AMBULATORY_CARE_PROVIDER_SITE_OTHER): Payer: 59 | Admitting: Cardiovascular Disease

## 2016-09-27 VITALS — BP 120/74 | HR 85 | Ht 65.0 in | Wt 220.0 lb

## 2016-09-27 DIAGNOSIS — R911 Solitary pulmonary nodule: Secondary | ICD-10-CM

## 2016-09-27 DIAGNOSIS — I700 Atherosclerosis of aorta: Secondary | ICD-10-CM | POA: Insufficient documentation

## 2016-09-27 DIAGNOSIS — I25118 Atherosclerotic heart disease of native coronary artery with other forms of angina pectoris: Secondary | ICD-10-CM

## 2016-09-27 DIAGNOSIS — Z87891 Personal history of nicotine dependence: Secondary | ICD-10-CM | POA: Diagnosis not present

## 2016-09-27 NOTE — Patient Instructions (Addendum)
Medication Instructions:   No medication changes made  Labwork:  No new labs needed  Testing/Procedures:  We will order a lexiscan myoview for CAD on CT scan  [place]  Your caregiver has ordered a Stress Test with nuclear imaging. The purpose of this test is to evaluate the blood supply to your heart muscle. This procedure is referred to as a "Non-Invasive Stress Test." This is because other than having an IV started in your vein, nothing is inserted or "invades" your body. Cardiac stress tests are done to find areas of poor blood flow to the heart by determining the extent of coronary artery disease (CAD). Some patients exercise on a treadmill, which naturally increases the blood flow to your heart, while others who are  unable to walk on a treadmill due to physical limitations have a pharmacologic/chemical stress agent called Lexiscan . This medicine will mimic walking on a treadmill by temporarily increasing your coronary blood flow.   Please note: these test may take anywhere between 2-4 hours to complete  PLEASE REPORT TO [place] AT THE FIRST DESK WILL DIRECT YOU WHERE TO GO  Date of Procedure:______Friday, May 11________  Arrival Time for Procedure:____9:15 am_______   How to prepare for your Myoview test:  1. Do not eat or drink after midnight 2. No caffeine for 24 hours prior to test 3. No smoking 24 hours prior to test. 4. Your medication may be taken with water.  If your doctor stopped a medication because of this test, do not take that medication. 5. Ladies, please do not wear dresses.  Skirts or pants are appropriate. Please wear a short sleeve shirt. 6. No perfume, cologne or lotion.   Follow-Up: It was a pleasure seeing you in the office today. Please call us if you have new issues that need to be addressed before your next appt.  [phone]  Your physician wants you to follow-up in:  As needed  If you need a refill  on your cardiac medications before your next appointment, please call your pharmacy.    Pharmacologic Stress Electrocardiogram Introduction A pharmacologic stress electrocardiogram is a heart (cardiac) test that uses nuclear imaging to evaluate the blood supply to your heart. This test may also be called a pharmacologic stress electrocardiography. Pharmacologic means that a medicine is used to increase your heart rate and blood pressure. This stress test is done to find areas of poor blood flow to the heart by determining the extent of coronary artery disease (CAD). Some people exercise on a treadmill, which naturally increases the blood flow to the heart. For those people unable to exercise on a treadmill, a medicine is used. This medicine stimulates your heart and will cause your heart to beat harder and more quickly, as if you were exercising. Pharmacologic stress tests can help determine:  The adequacy of blood flow to your heart during increased levels of activity in order to clear you for discharge home.  The extent of coronary artery blockage caused by CAD.  Your prognosis if you have suffered a heart attack.  The effectiveness of cardiac procedures done, such as an angioplasty, which can increase the circulation in your coronary arteries.  Causes of chest pain or pressure. LET [facility] CARE PROVIDER KNOW ABOUT:  Any allergies you have.  All medicines you are taking, including vitamins, herbs, eye drops, creams, and over-the-counter medicines.  Previous problems you or members of your family have had with the use of  anesthetics.  Any blood disorders you have.  Previous surgeries you have had.  Medical conditions you have.  Possibility of pregnancy, if this applies.  If you are currently breastfeeding. RISKS AND COMPLICATIONS Generally, this is a safe procedure. However, as with any procedure, complications can occur. Possible complications include:  You develop pain  or pressure in the following areas:  Chest.  Jaw or neck.  Between your shoulder blades.  Radiating down your left arm.  Headache.  Dizziness or light-headedness.  Shortness of breath.  Increased or irregular heartbeat.  Low blood pressure.  Nausea or vomiting.  Flushing.  Redness going up the arm and slight pain during injection of medicine.  Heart attack (rare). BEFORE THE PROCEDURE  Avoid all forms of caffeine for 24 hours before your test or as directed by your health care provider. This includes coffee, tea (even decaffeinated tea), caffeinated sodas, chocolate, cocoa, and certain pain medicines.  Follow your health care provider's instructions regarding eating and drinking before the test.  Take your medicines as directed at regular times with water unless instructed otherwise. Exceptions may include:  If you have diabetes, ask how you are to take your insulin or pills. It is common to adjust insulin dosing the morning of the test.  If you are taking beta-blocker medicines, it is important to talk to your health care provider about these medicines well before the date of your test. Taking beta-blocker medicines may interfere with the test. In some cases, these medicines need to be changed or stopped 24 hours or more before the test.  If you wear a nitroglycerin patch, it may need to be removed prior to the test. Ask your health care provider if the patch should be removed before the test.  If you use an inhaler for any breathing condition, bring it with you to the test.  If you are an outpatient, bring a snack so you can eat right after the stress phase of the test.  Do not smoke for 4 hours prior to the test or as directed by your health care provider.  Do not apply lotions, powders, creams, or oils on your chest prior to the test.  Wear comfortable shoes and clothing. Let your health care provider know if you were unable to complete or follow the preparations  for your test. PROCEDURE  Multiple patches (electrodes) will be put on your chest. If needed, small areas of your chest may be shaved to get better contact with the electrodes. Once the electrodes are attached to your body, multiple wires will be attached to the electrodes, and your heart rate will be monitored.  An IV access will be started. A nuclear trace (isotope) is given. The isotope may be given intravenously, or it may be swallowed. Nuclear refers to several types of radioactive isotopes, and the nuclear isotope lights up the arteries so that the nuclear images are clear. The isotope is absorbed by your body. This results in low radiation exposure.  A resting nuclear image is taken to show how your heart functions at rest.  A medicine is given through the IV access.  A second scan is done about 1 hour after the medicine injection and determines how your heart functions under stress.  During this stress phase, you will be connected to an electrocardiogram machine. Your blood pressure and oxygen levels will be monitored. What to expect after the procedure  Your heart rate and blood pressure will be monitored after the test.  You may  return to your normal schedule, including diet,activities, and medicines, unless your health care provider tells you otherwise. This information is not intended to replace advice given to you by your health care provider. Make sure you discuss any questions you have with your health care provider. Document Released: 09/24/2008 Document Revised: 10/14/2015 Document Reviewed: 11/15/2015 Elsevier Interactive Patient Education  2017 [employer].

## 2016-09-29 ENCOUNTER — Encounter
Admission: RE | Admit: 2016-09-29 | Discharge: 2016-09-29 | Disposition: A | Discharge status: Home / Self Care | Payer: 59 | Source: Ambulatory Visit | Attending: Cardiovascular Disease | Admitting: Cardiovascular Disease

## 2016-09-29 ENCOUNTER — Other Ambulatory Visit: Payer: Self-pay

## 2016-09-29 DIAGNOSIS — I25118 Atherosclerotic heart disease of native coronary artery with other forms of angina pectoris: Secondary | ICD-10-CM | POA: Insufficient documentation

## 2016-09-29 DIAGNOSIS — R911 Solitary pulmonary nodule: Secondary | ICD-10-CM | POA: Insufficient documentation

## 2016-09-29 DIAGNOSIS — I700 Atherosclerosis of aorta: Secondary | ICD-10-CM | POA: Diagnosis not present

## 2016-09-29 DIAGNOSIS — Z87891 Personal history of nicotine dependence: Secondary | ICD-10-CM | POA: Insufficient documentation

## 2016-09-29 LAB — NM MYOCAR MULTI W/SPECT W/WALL MOTION / EF
CHL CUP MPHR: 162 {beats}/min
CHL CUP NUCLEAR SDS: 1
CHL CUP NUCLEAR SRS: 14
CHL CUP NUCLEAR SSS: 7
CHL CUP STRESS STAGE 1 HR: 81 {beats}/min
CHL CUP STRESS STAGE 1 SPEED: 0 mph
CHL CUP STRESS STAGE 2 HR: 81 {beats}/min
CHL CUP STRESS STAGE 3 HR: 98 {beats}/min
CHL CUP STRESS STAGE 4 DBP: 58 mmHg
CHL CUP STRESS STAGE 4 GRADE: 0 %
CHL CUP STRESS STAGE 4 HR: 98 {beats}/min
CHL CUP STRESS STAGE 4 SPEED: 0 mph
CSEPEW: 1 METS
Exercise duration (min): 0 min
Exercise duration (sec): 0 s
LVDIAVOL: 86 mL (ref 46–106)
LVSYSVOL: 25 mL
Peak HR: 98 {beats}/min
Percent HR: 69 %
Percent of predicted max HR: 60 %
Rest HR: 78 {beats}/min
Stage 1 Grade: 0 %
Stage 2 Grade: 0 %
Stage 2 Speed: 0 mph
Stage 3 Grade: 0 %
Stage 3 Speed: 0 mph
Stage 4 SBP: 121 mmHg
TID: 1

## 2016-09-29 MED ORDER — TECHNETIUM TC 99M TETROFOSMIN IV KIT
31.3760 | PACK | Freq: Once | INTRAVENOUS | Status: AC | PRN
  Administered 2016-09-29: 31.376 via INTRAVENOUS

## 2016-09-29 MED ORDER — TECHNETIUM TC 99M TETROFOSMIN IV KIT
12.6260 | PACK | Freq: Once | INTRAVENOUS | Status: AC | PRN
  Administered 2016-09-29: 12.626 via INTRAVENOUS

## 2016-09-29 MED ORDER — REGADENOSON 0.4 MG/5ML IV SOLN
0.4000 mg | Freq: Once | INTRAVENOUS | Status: AC
  Administered 2016-09-29: 0.4 mg via INTRAVENOUS

## 2016-10-02 ENCOUNTER — Telehealth: Payer: Self-pay | Admitting: Cardiovascular Disease

## 2016-10-02 NOTE — Telephone Encounter (Signed)
Pt has questions regarding her stress test results. Please call. Pt asks that when we call we give her a few minutes to answer . She is at work and has to answer and step out the door.

## 2016-10-02 NOTE — Telephone Encounter (Signed)
Spoke w/ pt.  Reviewed results of stress test w/ her in detail. She was concerned about PET scan showing some calcifications.  Her PCP started her on Lipitor 80 mg daily and she will try to adjust her diet. Asked her to call back w/ any further questions or concerns.

## 2016-10-05 ENCOUNTER — Ambulatory Visit
Admission: RE | Admit: 2016-10-05 | Discharge: 2016-10-05 | Disposition: A | Discharge status: Home / Self Care | Payer: 59 | Source: Ambulatory Visit | Attending: Cardiothoracic Surgery | Admitting: Cardiothoracic Surgery

## 2016-10-05 ENCOUNTER — Other Ambulatory Visit: Payer: Self-pay

## 2016-10-05 DIAGNOSIS — K219 Gastro-esophageal reflux disease without esophagitis: Secondary | ICD-10-CM | POA: Insufficient documentation

## 2016-10-05 DIAGNOSIS — I2510 Atherosclerotic heart disease of native coronary artery without angina pectoris: Secondary | ICD-10-CM | POA: Insufficient documentation

## 2016-10-05 DIAGNOSIS — K30 Functional dyspepsia: Secondary | ICD-10-CM | POA: Insufficient documentation

## 2016-10-05 DIAGNOSIS — I700 Atherosclerosis of aorta: Secondary | ICD-10-CM | POA: Diagnosis not present

## 2016-10-05 DIAGNOSIS — E119 Type 2 diabetes mellitus without complications: Secondary | ICD-10-CM | POA: Insufficient documentation

## 2016-10-05 DIAGNOSIS — I10 Essential (primary) hypertension: Secondary | ICD-10-CM | POA: Insufficient documentation

## 2016-10-05 DIAGNOSIS — E559 Vitamin D deficiency, unspecified: Secondary | ICD-10-CM | POA: Insufficient documentation

## 2016-10-05 DIAGNOSIS — E7800 Pure hypercholesterolemia, unspecified: Secondary | ICD-10-CM | POA: Insufficient documentation

## 2016-10-05 DIAGNOSIS — K5900 Constipation, unspecified: Secondary | ICD-10-CM | POA: Insufficient documentation

## 2016-10-05 DIAGNOSIS — R918 Other nonspecific abnormal finding of lung field: Secondary | ICD-10-CM | POA: Insufficient documentation

## 2016-10-05 DIAGNOSIS — G4733 Obstructive sleep apnea (adult) (pediatric): Secondary | ICD-10-CM | POA: Insufficient documentation

## 2016-10-06 ENCOUNTER — Encounter: Payer: Self-pay | Admitting: Cardiothoracic Surgery

## 2016-10-06 ENCOUNTER — Ambulatory Visit (INDEPENDENT_AMBULATORY_CARE_PROVIDER_SITE_OTHER): Payer: 59 | Admitting: Cardiothoracic Surgery

## 2016-10-06 ENCOUNTER — Ambulatory Visit: Payer: Self-pay | Admitting: Cardiothoracic Surgery

## 2016-10-06 VITALS — BP 147/84 | HR 91 | Temp 98.4°F | Ht 65.0 in | Wt 217.6 lb

## 2016-10-06 DIAGNOSIS — R911 Solitary pulmonary nodule: Secondary | ICD-10-CM

## 2016-10-06 NOTE — Addendum Note (Signed)
Addended by: Wayna Chalet on: 10/06/2016 04:41 PM   Modules accepted: Orders

## 2016-10-06 NOTE — Patient Instructions (Signed)
We will see you back in three months after you have your CT Scan.

## 2016-10-06 NOTE — Progress Notes (Signed)
  Patient ID: Andrea Pollard, female   DOB: August 21, 1955, 61 y.o.   MRN: [id number]  HISTORY: She returns today in follow-up. She has no new complaints. She's been feeling quite well. She is an ex-smoker having quit over 25 years ago. She denied any fevers or chills. She did see Dr. Tanda Rockers who gave her a nuclear medicine stress test which was negative for myocardial ischemia.   Vitals:   10/06/16 0827  BP: (!) 147/84  Pulse: 91  Temp: 98.4 F (36.9 C)     EXAM:    Resp: Lungs are clear bilaterally.  No respiratory distress, normal effort. Heart:  Regular without murmurs Abd:  Abdomen is soft, non distended and non tender. No masses are palpable.  There is no rebound and no guarding.  Neurological: Alert and oriented to person, place, and time. Coordination normal.  Skin: Skin is warm and dry. No rash noted. No diaphoretic. No erythema. No pallor.  Psychiatric: Normal mood and affect. Normal behavior. Judgment and thought content normal.    ASSESSMENT: She did have a CT scan the chest done yesterday. The index nodule in the right upper lobe is either the same size or slightly smaller than before. I have independently reviewed that in compared to all the prior CTs.   PLAN:   After extensive discussion with her regarding the options including surgical intervention she would like to consider some other form of preoperative diagnosis. We did review the pathology from the navigational bronchoscopy. She would like to pursue a CT-guided needle biopsy or navigational bronchoscopy again before she considers undergoing surgical intervention. I explained this to her in detail and told her that I would discuss this with our interventional radiologist. We will be back in touch with her. All of her questions were answered.    Nestor Lewandowsky, MD

## 2016-10-31 ENCOUNTER — Ambulatory Visit: Payer: 59 | Attending: Internal Medicine

## 2016-11-07 ENCOUNTER — Ambulatory Visit (INDEPENDENT_AMBULATORY_CARE_PROVIDER_SITE_OTHER): Payer: 59 | Admitting: Internal Medicine

## 2016-11-07 ENCOUNTER — Encounter: Payer: Self-pay | Admitting: Internal Medicine

## 2016-11-07 VITALS — BP 128/78 | HR 83

## 2016-11-07 DIAGNOSIS — J449 Chronic obstructive pulmonary disease, unspecified: Secondary | ICD-10-CM | POA: Diagnosis not present

## 2016-11-07 NOTE — Patient Instructions (Signed)
Continue Qvar Check 6 minute walk test Albuterol as needed Continue CPAP therapy for sleep apnea Follow-up CT chest in 1-2 months

## 2016-11-07 NOTE — Progress Notes (Signed)
[facility] Consultation      Date: 11/07/2016,   MRN# [id number] Andrea Pollard 1956-05-03 Code Status:  Code Status History    This patient does not have a recorded code status. Please follow your organizational policy for patients in this situation.     Hosp day:@LENGTHOFSTAYDAYS @ Referring MD: @ATDPROV @     PCP:      Admission                  Current  Andrea Pollard is a 61 y.o. old female seen in consultation for RUL nodule at the request of Dr. Oleta Mouse.     CHIEF COMPLAINT:   I have a spot in my lungs   HISTORY OF PRESENT ILLNESS  61 yo white female seen today for follow up abnormal CT chest/PET scan patient has been dx with ASTHMA in her 11's She has been on albuterol as needed It has been very  well controlled over the past 10 years Former smoker 25 years ago 1 ppd for 10 years  COPD noted on PFT's Ratio 71% predicted FEV1 is 1.73 L 68% predicted Positive bronchodilator response of FEV1 FEF 25/75 is 1.1 L which is 40% predicted   January 2018 CT chest shows right upper lobe nodule 2 x 1.3 cm in size February 2018 CT chest shows right upper lobe nodule 1.8 x 1.2 cm in size May 2018 CT chest shows right upper lobe nodule 1.3 x 1.0 cm in size  I have reviewed findings with the patient and after further assessment the right upper lobe nodule has decreased in size over the last 6 months  Patient had undergone ENB which was nondiagnostic   Patient also had a follow-up appointment with Dr. Faith Rogue who recommended follow-up CAT scan  Previous CT chest  Images reviewed showed that RUL nodule has decreased in size 1.4 CM Also adenopathy has improved as well    She has no acute issues at this time Resp status back to normal Occasional SOB and wheezing with cough No signs of infection at this time No signs of acute heart failure at this time    Current Outpatient Prescriptions:  .  acetaminophen (TYLENOL) 500 MG tablet, Take 1,000  mg by mouth every 6 (six) hours as needed for mild pain., Disp: , Rfl:  .  albuterol (PROVENTIL HFA;VENTOLIN HFA) 108 (90 Base) MCG/ACT inhaler, Inhale 2 puffs into the lungs every 6 (six) hours as needed for wheezing or shortness of breath. , Disp: , Rfl:  .  atorvastatin (LIPITOR) 80 MG tablet, Take 80 mg by mouth daily., Disp: , Rfl: 2 .  citalopram (CELEXA) 10 MG tablet, Take 10 mg by mouth daily., Disp: , Rfl: 2 .  fluticasone (FLONASE) 50 MCG/ACT nasal spray, Place 2 sprays into both nostrils 1 day or 1 dose., Disp: , Rfl:  .  Fluticasone-Salmeterol (ADVAIR DISKUS) 250-50 MCG/DOSE AEPB, Inhale 2 Inhalers into the lungs 2 (two) times daily., Disp: , Rfl:  .  hydrochlorothiazide (HYDRODIURIL) 12.5 MG tablet, Take 12.5 mg by mouth daily., Disp: , Rfl:  .  lansoprazole (PREVACID) 15 MG capsule, Take 15 mg by mouth daily at 12 noon., Disp: , Rfl:  .  Melatonin 10 MG TABS, Take 10 mg by mouth at bedtime as needed (sleep)., Disp: , Rfl:  .  metFORMIN (GLUCOPHAGE-XR) 500 MG 24 hr tablet, Take 2 tablets by mouth 2 (two) times daily., Disp: , Rfl: 2 .  Multiple Vitamin (MULTI-VITAMIN DAILY PO), Take  1 mL by mouth 1 day or 1 dose., Disp: , Rfl:     ALLERGIES   Bactrim [sulfamethoxazole-trimethoprim]; Ibuprofen; Levaquin [levofloxacin in d5w]; and Nitroglycerin     REVIEW OF SYSTEMS   Review of Systems  Constitutional: Negative for chills, diaphoresis, fever, malaise/fatigue and weight loss.  HENT: Negative for congestion and hearing loss.   Eyes: Negative for blurred vision and double vision.  Respiratory: Negative for cough, hemoptysis, sputum production, shortness of breath and wheezing.   Cardiovascular: Negative for chest pain, palpitations and orthopnea.  Gastrointestinal: Negative for abdominal pain, heartburn, nausea and vomiting.  Genitourinary: Negative for dysuria and urgency.  Musculoskeletal: Negative for myalgias and neck pain.  Skin: Negative for rash.  Neurological:  Negative for dizziness, tingling, tremors, weakness and headaches.  Endo/Heme/Allergies: Does not bruise/bleed easily.  Psychiatric/Behavioral: Negative for depression, substance abuse and suicidal ideas.  All other systems reviewed and are negative.    VS: BP 128/78 (BP Location: Left Arm, Cuff Size: Normal)   Pulse 83   SpO2 94%      PHYSICAL EXAM  Physical Exam  Constitutional: She is oriented to person, place, and time. She appears well-developed and well-nourished. No distress.  HENT:  Head: Normocephalic and atraumatic.  Mouth/Throat: No oropharyngeal exudate.  Eyes: EOM are normal. Pupils are equal, round, and reactive to light. No scleral icterus.  Neck: Normal range of motion. Neck supple.  Cardiovascular: Normal rate, regular rhythm and normal heart sounds.   No murmur heard. Pulmonary/Chest: No stridor. No respiratory distress. She has no wheezes.  Abdominal: Soft. Bowel sounds are normal.  Musculoskeletal: Normal range of motion. She exhibits no edema.  Neurological: She is alert and oriented to person, place, and time. No cranial nerve deficit.  Skin: Skin is warm. She is not diaphoretic.  Psychiatric: She has a normal mood and affect.         scan   ASSESSMENT/PLAN  61 yo pleasant white female with  Moderate COPD Gold stage A which seems to be controlled with her current regimen of Qvar, in the setting of obesity and deconditioned state with an abnormal finding on CT scan with a right upper lobe nodular opacity status post ENB which was nondiagnostic    #1 shortness of breath likely from her obesity and her deconditioned state  #2 moderate COPD based on pulmonary function testing with FEV1 of 68% Continue Qvar as prescribed Albuterol as needed Avoid secondhand smoke  #3 right upper lobe nodular opacity After further assessment and review of CT scans with the patient, the size of the right upper lobe nodule has decreased over the last 6 months Recommend  follow-up CAT scan of the chest in 1-2 months for interval assessment  #4 Obesity -recommend significant weight loss -recommend changing diet  #5 Deconditioned state -Recommend increased daily activity and exercise  Patient/Family are satisfied with Plan of action and management. All questions answered Follow-up in 1-2 months after CT of the chest completed    Corrin Parker, M.D.  [facility]  Medical Director [place] Director [facility] Cardio-Pulmonary Department

## 2016-11-09 ENCOUNTER — Telehealth: Payer: Self-pay | Admitting: Internal Medicine

## 2016-11-09 MED ORDER — BECLOMETHASONE DIPROP HFA 40 MCG/ACT IN AERB: 2.0000 | INHALATION_SPRAY | Freq: Every day | RESPIRATORY_TRACT | 5 refills | Status: DC

## 2016-11-09 NOTE — Telephone Encounter (Signed)
Please call work  [phone]

## 2016-11-09 NOTE — Telephone Encounter (Signed)
Patient calling back to confirm medication dosage   She takes Qvar 40 mcg inh 2 puffs BID   Patient wants samples

## 2016-11-09 NOTE — Telephone Encounter (Signed)
RX sent to [facility] per pt request. Nothing further needed

## 2016-11-24 ENCOUNTER — Ambulatory Visit: Payer: 59

## 2016-11-28 ENCOUNTER — Emergency Department
Admission: EM | Admit: 2016-11-28 | Discharge: 2016-11-28 | Disposition: A | Discharge status: Home / Self Care | Payer: 59 | Attending: Emergency Medicine | Admitting: Emergency Medicine

## 2016-11-28 ENCOUNTER — Encounter: Payer: Self-pay | Admitting: Emergency Medicine

## 2016-11-28 ENCOUNTER — Other Ambulatory Visit: Payer: Self-pay

## 2016-11-28 ENCOUNTER — Emergency Department: Payer: 59

## 2016-11-28 DIAGNOSIS — I2699 Other pulmonary embolism without acute cor pulmonale: Secondary | ICD-10-CM

## 2016-11-28 DIAGNOSIS — Z87891 Personal history of nicotine dependence: Secondary | ICD-10-CM | POA: Insufficient documentation

## 2016-11-28 DIAGNOSIS — R609 Edema, unspecified: Secondary | ICD-10-CM

## 2016-11-28 DIAGNOSIS — Z79899 Other long term (current) drug therapy: Secondary | ICD-10-CM | POA: Insufficient documentation

## 2016-11-28 DIAGNOSIS — R0602 Shortness of breath: Secondary | ICD-10-CM | POA: Diagnosis not present

## 2016-11-28 DIAGNOSIS — R2242 Localized swelling, mass and lump, left lower limb: Secondary | ICD-10-CM | POA: Diagnosis present

## 2016-11-28 DIAGNOSIS — E119 Type 2 diabetes mellitus without complications: Secondary | ICD-10-CM | POA: Diagnosis not present

## 2016-11-28 DIAGNOSIS — Z7951 Long term (current) use of inhaled steroids: Secondary | ICD-10-CM | POA: Insufficient documentation

## 2016-11-28 DIAGNOSIS — I2510 Atherosclerotic heart disease of native coronary artery without angina pectoris: Secondary | ICD-10-CM | POA: Diagnosis not present

## 2016-11-28 DIAGNOSIS — I82402 Acute embolism and thrombosis of unspecified deep veins of left lower extremity: Secondary | ICD-10-CM | POA: Diagnosis not present

## 2016-11-28 DIAGNOSIS — J45909 Unspecified asthma, uncomplicated: Secondary | ICD-10-CM | POA: Insufficient documentation

## 2016-11-28 DIAGNOSIS — Z7984 Long term (current) use of oral hypoglycemic drugs: Secondary | ICD-10-CM | POA: Diagnosis not present

## 2016-11-28 LAB — CBC WITH DIFFERENTIAL/PLATELET
BASOS PCT: 1 %
Basophils Absolute: 0.1 10*3/uL (ref 0–0.1)
EOS ABS: 0.2 10*3/uL (ref 0–0.7)
Eosinophils Relative: 2 %
HEMATOCRIT: 43 % (ref 35.0–47.0)
HEMOGLOBIN: 14.9 g/dL (ref 12.0–16.0)
LYMPHS ABS: 2.8 10*3/uL (ref 1.0–3.6)
Lymphocytes Relative: 29 %
MCH: 28.3 pg (ref 26.0–34.0)
MCHC: 34.6 g/dL (ref 32.0–36.0)
MCV: 81.8 fL (ref 80.0–100.0)
MONOS PCT: 5 %
Monocytes Absolute: 0.4 10*3/uL (ref 0.2–0.9)
NEUTROS ABS: 6.3 10*3/uL (ref 1.4–6.5)
NEUTROS PCT: 63 %
Platelets: 208 10*3/uL (ref 150–440)
RBC: 5.26 MIL/uL — AB (ref 3.80–5.20)
RDW: 13.9 % (ref 11.5–14.5)
WBC: 9.8 10*3/uL (ref 3.6–11.0)

## 2016-11-28 LAB — COMPREHENSIVE METABOLIC PANEL
ALT: 22 U/L (ref 14–54)
ANION GAP: 10 (ref 5–15)
AST: 25 U/L (ref 15–41)
Albumin: 4.1 g/dL (ref 3.5–5.0)
Alkaline Phosphatase: 87 U/L (ref 38–126)
BUN: 16 mg/dL (ref 6–20)
CHLORIDE: 99 mmol/L — AB (ref 101–111)
CO2: 30 mmol/L (ref 22–32)
CREATININE: 0.64 mg/dL (ref 0.44–1.00)
Calcium: 9.4 mg/dL (ref 8.9–10.3)
Glucose, Bld: 97 mg/dL (ref 65–99)
Potassium: 3.2 mmol/L — ABNORMAL LOW (ref 3.5–5.1)
SODIUM: 139 mmol/L (ref 135–145)
Total Bilirubin: 0.7 mg/dL (ref 0.3–1.2)
Total Protein: 7.8 g/dL (ref 6.5–8.1)

## 2016-11-28 LAB — BRAIN NATRIURETIC PEPTIDE: B NATRIURETIC PEPTIDE 5: 22 pg/mL (ref 0.0–100.0)

## 2016-11-28 LAB — TROPONIN I

## 2016-11-28 MED ORDER — RIVAROXABAN 15 MG PO TABS
15.0000 mg | ORAL_TABLET | Freq: Two times a day (BID) | ORAL | Status: DC
  Administered 2016-11-28: 15 mg via ORAL
  Filled 2016-11-28: qty 1

## 2016-11-28 MED ORDER — RIVAROXABAN (XARELTO) VTE STARTER PACK (15 & 20 MG): ORAL_TABLET | ORAL | 0 refills | Status: DC

## 2016-11-28 MED ORDER — IOPAMIDOL (ISOVUE-370) INJECTION 76%
75.0000 mL | Freq: Once | INTRAVENOUS | Status: AC | PRN
  Administered 2016-11-28: 75 mL via INTRAVENOUS
  Filled 2016-11-28: qty 75

## 2016-11-28 MED ORDER — HYDROCODONE-ACETAMINOPHEN 5-325 MG PO TABS
1.0000 | ORAL_TABLET | Freq: Once | ORAL | Status: AC
  Administered 2016-11-28: 1 via ORAL
  Filled 2016-11-28: qty 1

## 2016-11-28 NOTE — ED Notes (Signed)
Signature pad not working.  Patient verbalized understanding of discharge instructions and has no further questions. 

## 2016-11-28 NOTE — ED Notes (Signed)
Patient transported to CT 

## 2016-11-28 NOTE — Discharge Instructions (Signed)
Please take your Xarelto twice a day as prescribed and follow up with your oncologist in one week for reexamination. Return to the emergency department sooner for any new or worsening symptoms such as worsening shortness of breath, chest pain, if you pass out, or for any other concerns whatsoever.  It was a pleasure to take care of you today, and thank you for coming to our emergency department.  If you have any questions or concerns before leaving please ask the nurse to grab me and I'm more than happy to go through your aftercare instructions again.  If you were prescribed any opioid pain medication today such as Norco, Vicodin, Percocet, morphine, hydrocodone, or oxycodone please make sure you do not drive when you are taking this medication as it can alter your ability to drive safely.  If you have any concerns once you are home that you are not improving or are in fact getting worse before you can make it to your follow-up appointment, please do not hesitate to call [phone] and come back for further evaluation.  Darel Hong MD  Results for orders placed or performed during the hospital encounter of 11/28/16  Comprehensive metabolic panel  Result Value Ref Range   Sodium 139 135 - 145 mmol/L   Potassium 3.2 (L) 3.5 - 5.1 mmol/L   Chloride 99 (L) 101 - 111 mmol/L   CO2 30 22 - 32 mmol/L   Glucose, Bld 97 65 - 99 mg/dL   BUN 16 6 - 20 mg/dL   Creatinine, Ser 0.64 0.44 - 1.00 mg/dL   Calcium 9.4 8.9 - 10.3 mg/dL   Total Protein 7.8 6.5 - 8.1 g/dL   Albumin 4.1 3.5 - 5.0 g/dL   AST 25 15 - 41 U/L   ALT 22 14 - 54 U/L   Alkaline Phosphatase 87 38 - 126 U/L   Total Bilirubin 0.7 0.3 - 1.2 mg/dL   GFR calc non Af Amer >60 >60 mL/min   GFR calc Af Amer >60 >60 mL/min   Anion gap 10 5 - 15  CBC with Differential  Result Value Ref Range   WBC 9.8 3.6 - 11.0 K/uL   RBC 5.26 (H) 3.80 - 5.20 MIL/uL   Hemoglobin 14.9 12.0 - 16.0 g/dL   HCT 43.0 35.0 - 47.0 %   MCV 81.8 80.0 - 100.0 fL   MCH  28.3 26.0 - 34.0 pg   MCHC 34.6 32.0 - 36.0 g/dL   RDW 13.9 11.5 - 14.5 %   Platelets 208 150 - 440 K/uL   Neutrophils Relative % 63 %   Neutro Abs 6.3 1.4 - 6.5 K/uL   Lymphocytes Relative 29 %   Lymphs Abs 2.8 1.0 - 3.6 K/uL   Monocytes Relative 5 %   Monocytes Absolute 0.4 0.2 - 0.9 K/uL   Eosinophils Relative 2 %   Eosinophils Absolute 0.2 0 - 0.7 K/uL   Basophils Relative 1 %   Basophils Absolute 0.1 0 - 0.1 K/uL  Troponin I  Result Value Ref Range   Troponin I <0.03 <0.03 ng/mL  Brain natriuretic peptide  Result Value Ref Range   B Natriuretic Peptide 22.0 0.0 - 100.0 pg/mL   Ct Angio Chest Pe W/cm &/or Wo Cm  Result Date: 11/28/2016 CLINICAL DATA:  Left lower extremity swelling since this morning, headache for 2 days. EXAM: CT ANGIOGRAPHY CHEST WITH CONTRAST TECHNIQUE: Multidetector CT imaging of the chest was performed using the standard protocol during bolus administration of intravenous  contrast. Multiplanar CT image reconstructions and MIPs were obtained to evaluate the vascular anatomy. CONTRAST:  75 cc Isovue 370 COMPARISON:  Chest CT dated 10/05/2016. FINDINGS: Cardiovascular: Moderate amount of nonocclusive pulmonary emboli within the distal portion of the left main pulmonary artery extending into multiple central segmental pulmonary artery branches to the left upper lobe, lingula and left lower lobe. Additional nearly occlusive pulmonary emboli at the junction of the distal right interlobar pulmonary artery and central segmental pulmonary artery branches to the right lower lobe, extending into multiple pulmonary artery branches to the right lower lobe. Aortic atherosclerosis. No aortic aneurysm or dissection. Heart size is upper normal. No pericardial effusion. Coronary artery calcifications noted. Mediastinum/Nodes: Scattered small lymph nodes within the mediastinum, none of which are pathologic by CT size criteria. Mixed density lesion with calcifications within the right  thyroid lobe measures 2 cm greatest dimension. Small hiatal hernia within the lower mediastinum. Lungs/Pleura: Spiculated pulmonary nodule within the right upper lobe is stable compared to chest CT of 10/05/2016 (series 6, image 39). The adjacent pulmonary nodules within the right upper lobe also appear stable in the short-term interval. No new lung findings in the short-term interval. No pleural effusion. Upper Abdomen: No acute findings. Musculoskeletal: No chest wall abnormality. No acute or significant osseous findings. Review of the MIP images confirms the above findings. IMPRESSION: 1. Acute pulmonary emboli bilaterally, left greater than right, as detailed above. No evidence of associated right heart strain. No evidence of pulmonary infarction or other complicating feature. 2. Stable appearance of the spiculated pulmonary nodule within the right upper lobe compared to recent chest CT of 10/05/2016, again measuring 13 mm greatest dimension. Additional smaller pulmonary nodules also appear stable in the short-term interval. 3. As described on previous reports, partially calcified right thyroid nodule which is stable in size measuring approximately 2 cm greatest dimension. If not already performed, consider further characterization with thyroid ultrasound. 4. Aortic atherosclerosis. Aortic Atherosclerosis (ICD10-I70.0). Critical Value/emergent results were called by telephone at the time of interpretation on 11/28/2016 at 4:40 pm to Dr. Darel Hong , who verbally acknowledged these results. Electronically Signed   By: Franki Cabot M.D.   On: 11/28/2016 16:44   US Venous Img Lower Unilateral Left  Result Date: 11/28/2016 CLINICAL DATA:  Lower extremity pain and edema EXAM: LEFT LOWER EXTREMITY VENOUS DUPLEX ULTRASOUND TECHNIQUE: Gray-scale sonography with graded compression, as well as color Doppler and duplex ultrasound were performed to evaluate the left lower extremity deep venous system from the level  of the common femoral vein and including the common femoral, femoral, profunda femoral, popliteal and calf veins including the posterior tibial, peroneal and gastrocnemius veins when visible. The superficial great saphenous vein was also interrogated. Spectral Doppler was utilized to evaluate flow at rest and with distal augmentation maneuvers in the common femoral, femoral and popliteal veins. COMPARISON:  None. FINDINGS: Contralateral Common Femoral Vein: Respiratory phasicity is normal and symmetric with the symptomatic side. No evidence of thrombus. Normal compressibility. Common Femoral Vein: No evidence of thrombus. Normal compressibility, respiratory phasicity and response to augmentation. Saphenofemoral Junction: No evidence of thrombus. Normal compressibility and flow on color Doppler imaging. Profunda Femoral Vein: No evidence of thrombus. Normal compressibility and flow on color Doppler imaging. Femoral Vein: There is acute deep venous thrombosis throughout portions of this vessel with diminished but present Doppler signal indicating incomplete obstruction of the left femoral vein. There is loss of compressibility and augmentation in this vessel. Popliteal vein: There is occlusive thrombus  in this vessel with loss of Doppler signal, augmentation, and compression. Calf Veins: There is occlusive thrombus throughout the calf veins. There is loss of compression augmentation. Superficial Great Saphenous Vein: No evidence of thrombus. Normal compressibility and flow on color Doppler imaging. Venous Reflux:  None. Other Findings:  None. IMPRESSION: Extensive left lower extremity deep venous thrombosis with occlusive thrombus in the popliteal and calf veins. Incompletely occlusive thrombus is noted in the left femoral vein. Thrombus in these vessels appears acute. More proximally, no deep venous thrombosis evident. Left common femoral vein patent. Electronically Signed   By: Lowella Grip III M.D.   On:  11/28/2016 13:55

## 2016-11-28 NOTE — ED Provider Notes (Signed)
[facility] Emergency Department Provider Note  ____________________________________________   First MD Initiated Contact with Patient 11/28/16 1424     (approximate)  I have reviewed the triage vital signs and the nursing notes.   HISTORY  Chief Complaint Leg Swelling; Tingling; and Headache   HPI Andrea Pollard is a 61 y.o. female who self presents to the emergency department with 1 day of moderate to severe throbbing discomfort in her left leg associated with leg swelling and red discoloration. Yesterday she was normal and this all began gradually and she awoke. This concerned her so she went to urgent care who recommended she come to the emergency department for further evaluation. She has a past medical history of morbid obesity and is status post gastric bypass 2 years ago, diabetes mellitus, asthma, and is currently being worked up for a pulmonary nodule. Her first biopsy was negative but her oncologist is concerned that it may actually be malignant.She's had no recent travel although she did have a recent bronchoscopy for biopsy. She's never had a DVT or pulmonary embolism before. She does report some shortness of breath although she denies chest pain.   Past Medical History:  Diagnosis Date  . Asthma   . Complication of anesthesia   . Diabetes mellitus without complication ([place])   . GERD (gastroesophageal reflux disease)   . 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 History of methicillin resistant staphylococcus aureus (MRSA) 2013  . Hypertension   . Pneumonia 04/2016  . PONV (postoperative nausea and vomiting)    NAUSEATED  . Sleep apnea    CPAP    Patient Active Problem List   Diagnosis Date Noted  . Benign hypertension 10/05/2016  . Constipation 10/05/2016  . Gastroesophageal reflux disease 10/05/2016  . Hypercholesterolemia 10/05/2016  . Indigestion 10/05/2016  . Obstructive sleep apnea of adult 10/05/2016  . Type 2 diabetes mellitus  ([place]) 10/05/2016  . Vitamin D deficiency 10/05/2016  . Coronary artery disease of native artery of native heart with stable angina pectoris ([place]) 09/27/2016  . Aortic atherosclerosis ([place]) 09/27/2016  . History of smoking 09/27/2016  . Solitary pulmonary nodule 06/07/2016  . Morbid obesity ([place]) 04/23/2014    Past Surgical History:  Procedure Laterality Date  . ABDOMINAL HYSTERECTOMY    . CESAREAN SECTION    . CHOLECYSTECTOMY    . ELECTROMAGNETIC NAVIGATION BROCHOSCOPY N/A 08/15/2016   Procedure: ELECTROMAGNETIC NAVIGATION BRONCHOSCOPY;  Surgeon: Flora Lipps, MD;  Location: [facility] ORS;  Service: Cardiopulmonary;  Laterality: N/A;  . LAPAROSCOPIC GASTRIC SLEEVE RESECTION      Prior to Admission medications   Medication Sig Start Date End Date Taking? Authorizing Provider  acetaminophen (TYLENOL) 500 MG tablet Take 1,000 mg by mouth every 6 (six) hours as needed for mild pain.    [provider]  albuterol (PROVENTIL HFA;VENTOLIN HFA) 108 (90 Base) MCG/ACT inhaler Inhale 2 puffs into the lungs every 6 (six) hours as needed for wheezing or shortness of breath.     [provider]  atorvastatin (LIPITOR) 80 MG tablet Take 80 mg by mouth daily. 08/30/16   [provider]  beclomethasone (QVAR REDIHALER) 40 MCG/ACT inhaler Inhale 2 puffs into the lungs daily. 11/09/16   Flora Lipps, MD  citalopram (CELEXA) 10 MG tablet Take 10 mg by mouth daily. 10/11/16   [provider]  fluticasone (FLONASE) 50 MCG/ACT nasal spray Place 2 sprays into both nostrils 1 day or 1 dose.    [provider]  Fluticasone-Salmeterol (ADVAIR DISKUS) 250-50 MCG/DOSE AEPB Inhale 2 Inhalers into the lungs 2 (two) times daily. 02/20/08   [provider]  hydrochlorothiazide (HYDRODIURIL) 12.5 MG tablet Take 12.5 mg by mouth daily.    [provider]  lansoprazole (PREVACID) 15 MG capsule Take 15 mg by mouth daily at 12 noon.    [provider]  Melatonin  10 MG TABS Take 10 mg by mouth at bedtime as needed (sleep).    [provider]  metFORMIN (GLUCOPHAGE-XR) 500 MG 24 hr tablet Take 2 tablets by mouth 2 (two) times daily. 03/23/16   [provider]  Multiple Vitamin (MULTI-VITAMIN DAILY PO) Take 1 mL by mouth 1 day or 1 dose.    [provider]  Rivaroxaban 15 & 20 MG TBPK Take as directed on package: Start with one 15mg  tablet by mouth twice a day with food. On Day 22, switch to one 20mg  tablet once a day with food. 11/28/16   Darel Hong, MD    Allergies Bactrim [sulfamethoxazole-trimethoprim]; Ibuprofen; Levaquin [levofloxacin in d5w]; and Nitroglycerin  Family History  Problem Relation Age of Onset  . Stroke Father   . Ovarian cancer Maternal Grandmother     Social History Social History  Substance Use Topics  . Smoking status: Former Smoker    Packs/day: 1.00    Years: 5.00    Types: Cigarettes    Quit date: 08/12/1991  . Smokeless tobacco: Never Used  . Alcohol use No    Review of Systems Constitutional: No fever/chills Eyes: No visual changes. ENT: No sore throat. Cardiovascular: Denies chest pain. Respiratory: Positive shortness of breath. Gastrointestinal: No abdominal pain.  No nausea, no vomiting.  No diarrhea.  No constipation. Genitourinary: Negative for dysuria. Musculoskeletal: Negative for back pain. Skin: Negative for rash. Neurological: Negative for headaches, focal weakness or numbness.   ____________________________________________   PHYSICAL EXAM:  VITAL SIGNS: ED Triage Vitals  Enc Vitals Group     BP 11/28/16 1144 (!) 157/89     Pulse Rate 11/28/16 1144 79     Resp 11/28/16 1144 20     Temp 11/28/16 1144 98.2 F (36.8 C)     Temp Source 11/28/16 1144 Oral     SpO2 11/28/16 1144 96 %     Weight 11/28/16 1143 210 lb (95.3 kg)     Height 11/28/16 1143 5\' 5"  (1.651 m)     Head Circumference --      Peak Flow --      Pain Score 11/28/16 1152 8     Pain Loc --        Pain Edu? --      Excl. in [place]? --     Constitutional: Alert and oriented 4 very well-appearing nontoxic no diaphoresis speaks in full clear sentences Eyes: PERRL EOMI. Head: Atraumatic. Nose: No congestion/rhinnorhea. Mouth/Throat: No trismus Neck: No stridor.   Cardiovascular: Normal rate, regular rhythm. Grossly normal heart sounds.  Good peripheral circulation. Respiratory: Normal respiratory effort.  No retractions. Lungs CTAB and moving good air Gastrointestinal: Obese abdomen nontender Musculoskeletal: Left lower extremity swelling and larger than the right with overlying erythema. 2+ dorsalis pedis pulse Neurologic:  Normal speech and language. No gross focal neurologic deficits are appreciated. Skin:  As above Psychiatric: Mood and affect are normal. Speech and behavior are normal.    ____________________________________________   DIFFERENTIAL includes but not limited to  Deep vein thrombus, pulmonary embolus, lung cancer ____________________________________________   LABS (all labs ordered are listed,  but only abnormal results are displayed)  Labs Reviewed  COMPREHENSIVE METABOLIC PANEL - Abnormal; Notable for the following:       Result Value   Potassium 3.2 (*)    Chloride 99 (*)    All other components within normal limits  CBC WITH DIFFERENTIAL/PLATELET - Abnormal; Notable for the following:    RBC 5.26 (*)    All other components within normal limits  TROPONIN I  BRAIN NATRIURETIC PEPTIDE    No signs of heart strain normal renal function __________________________________________  EKG  ED ECG REPORT I, Darel Hong, the attending physician, personally viewed and interpreted this ECG.  Date: 11/28/2016  Rate: 73 Rhythm: normal sinus rhythm QRS Axis: Rightward axis Intervals: normal ST/T Wave abnormalities: normal Narrative Interpretation: Abnormal  ____________________________________________  RADIOLOGY  Ultrasound the left lower  extremity confirms deep vein thrombus ____________________________________________   PROCEDURES  Procedure(s) performed: no  Procedures  Critical Care performed: yes  CRITICAL CARE Performed by: Darel Hong   Total critical care time: 35 minutes  Critical care time was exclusive of separately billable procedures and treating other patients.  Critical care was necessary to treat or prevent imminent or life-threatening deterioration.  Critical care was time spent personally by me on the following activities: development of treatment plan with patient and/or surrogate as well as nursing, discussions with consultants, evaluation of patient's response to treatment, examination of patient, obtaining history from patient or surrogate, ordering and performing treatments and interventions, ordering and review of laboratory studies, ordering and review of radiographic studies, pulse oximetry and re-evaluation of patient's condition.   Observation: no ____________________________________________   INITIAL IMPRESSION / ASSESSMENT AND PLAN / ED COURSE  Pertinent labs & imaging results that were available during my care of the patient were reviewed by me and considered in my medical decision making (see chart for details).  By the time I saw the patient she had already had an ultrasound confirming left-sided deep vein thrombus. She has strong distal pulse and no evidence of arterial occlusion. Prior to initiating her on anticoagulants I will touch base with her oncologist Dr. Grayland Ormond as she may be a candidate for Lovenox rather than a NOAC. She is also reporting some shortness of breath so I think a CT angiogram to fully evaluate for pulmonary embolism is warranted.    ----------------------------------------- 3:46 PM on 11/28/2016 -----------------------------------------  I discussed the case with Dr. Grayland Ormond who recommends either xarelto or eliquis and not Lovenox.  CT  pending.  ____________________________________________ The CT scan shows the patient has bilateral pulmonary emboli. It does not show any right heart strain and her troponin and BNP are also negative. Given her normal renal function and the constellation of symptoms as well as a PESI score of 58 which is "very low risk" I'm comfortable discharging the patient home. She is saturating 100% on room air. She is given her first dose of Xarelto here. The patient is medically stable for outpatient management.  FINAL CLINICAL IMPRESSION(S) / ED DIAGNOSES  Final diagnoses:  Acute deep vein thrombosis (DVT) of left lower extremity, unspecified vein (HCC)  Other acute pulmonary embolism without acute cor pulmonale (HCC)      NEW MEDICATIONS STARTED DURING THIS VISIT:  Discharge Medication List as of 11/28/2016  4:58 PM    START taking these medications   Details  Rivaroxaban 15 & 20 MG TBPK Take as directed on package: Start with one 15mg  tablet by mouth twice a day with food. On Day 22, switch  to one 20mg  tablet once a day with food., Print         Note:  This document was prepared using Dragon voice recognition software and may include unintentional dictation errors.     Darel Hong, MD 11/28/16 [phone]

## 2016-11-28 NOTE — ED Triage Notes (Signed)
Says left lower extremity swelling since this am.  Also headach x 2 days .  Fingers tingling since this am, mostly on left side.

## 2016-11-28 NOTE — ED Notes (Addendum)
2 unsuccessful IV attempts by this RN to right AC. Patient tolerated well.

## 2016-12-03 NOTE — Progress Notes (Signed)
[place]  [phone] [phone]  ID: Andrea Pollard OB: 04/29/1956  MR#: [id number]  Andrea Pollard  Patient Care Team: Ellamae Sia, MD as PCP - General (Internal Medicine) Minna Merritts, MD as Consulting Physician (Cardiology)  CHIEF COMPLAINT: Mass of upper lobe of right lung, DVT/PE.  INTERVAL HISTORY: Patient returns to clinic today as an add-on after recently being diagnosed with DVT and PE. She continues to be anxious, but otherwise feels well. She does not complain of shortness of breath today. She has no neurologic complaints. She denies any recent fevers. She has a good appetite and denies weight loss. She has no cough, chest pain, or hemoptysis. She denies any nausea, vomiting, constipation, or diarrhea. She has no urinary complaints. Patient otherwise feels well and offers no further specific complaints.  REVIEW OF SYSTEMS:   Review of Systems  Constitutional: Negative.  Negative for fever, malaise/fatigue and weight loss.  Respiratory: Negative for cough, hemoptysis and shortness of breath.   Cardiovascular: Negative.  Negative for chest pain and leg swelling.  Gastrointestinal: Negative.  Negative for abdominal pain.  Genitourinary: Negative.   Musculoskeletal: Negative.   Neurological: Negative.  Negative for sensory change and weakness.  Psychiatric/Behavioral: The patient is nervous/anxious. The patient does not have insomnia.     As per HPI. Otherwise, a complete review of systems is negative.  PAST MEDICAL HISTORY: Past Medical History:  Diagnosis Date  . Asthma   . Complication of anesthesia   . Diabetes mellitus without complication ([place])   . GERD (gastroesophageal reflux disease)   . 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 History of methicillin resistant staphylococcus aureus (MRSA) 2013  . Hypertension   . Pneumonia 04/2016  . PONV (postoperative nausea and vomiting)    NAUSEATED  . Sleep apnea    CPAP    PAST SURGICAL HISTORY: Past Surgical History:  Procedure Laterality Date  . ABDOMINAL HYSTERECTOMY    . CESAREAN SECTION    . CHOLECYSTECTOMY    . ELECTROMAGNETIC NAVIGATION BROCHOSCOPY N/A 08/15/2016   Procedure: ELECTROMAGNETIC NAVIGATION BRONCHOSCOPY;  Surgeon: Flora Lipps, MD;  Location: [facility] ORS;  Service: Cardiopulmonary;  Laterality: N/A;  . LAPAROSCOPIC GASTRIC SLEEVE RESECTION      FAMILY HISTORY: Family History  Problem Relation Age of Onset  . Stroke Father   . Ovarian cancer Maternal Grandmother     ADVANCED DIRECTIVES (Y/N):  N  HEALTH MAINTENANCE: Social History  Substance Use Topics  . Smoking status: Former Smoker    Packs/day: 1.00    Years: 5.00    Types: Cigarettes    Quit date: 08/12/1991  . Smokeless tobacco: Never Used  . Alcohol use No     Colonoscopy:  PAP:  Bone density:  Lipid panel:  Allergies  Allergen Reactions  . Bactrim [Sulfamethoxazole-Trimethoprim] Hives  . Ibuprofen Other (See Comments)    Cannot Use due to Gastric bypass  . Levaquin [Levofloxacin In D5w] Hives  . Nitroglycerin Other (See Comments)    Hypotension    Current Outpatient Prescriptions  Medication Sig Dispense Refill  . acetaminophen (TYLENOL) 500 MG tablet Take 1,000 mg by mouth every 6 (six) hours as needed for mild pain.    Marland Kitchen albuterol (PROVENTIL HFA;VENTOLIN HFA) 108 (90 Base) MCG/ACT inhaler Inhale 2 puffs into the lungs every 6 (six) hours as needed for wheezing or shortness of breath.     Marland Kitchen atorvastatin (LIPITOR) 80 MG tablet Take 80 mg by mouth daily.  2  .  beclomethasone (QVAR REDIHALER) 40 MCG/ACT inhaler Inhale 2 puffs into the lungs daily. 1 Inhaler 5  . citalopram (CELEXA) 10 MG tablet Take 10 mg by mouth daily.  2  . fluticasone (FLONASE) 50 MCG/ACT nasal spray Place 2 sprays into both nostrils 1 day or 1 dose.    Marland Kitchen Fluticasone-Salmeterol (ADVAIR DISKUS) 250-50 MCG/DOSE AEPB Inhale 2 Inhalers into the lungs 2 (two) times daily.    .  hydrochlorothiazide (HYDRODIURIL) 12.5 MG tablet Take 12.5 mg by mouth daily.    . lansoprazole (PREVACID) 15 MG capsule Take 15 mg by mouth daily at 12 noon.    . Melatonin 10 MG TABS Take 10 mg by mouth at bedtime as needed (sleep).    . metFORMIN (GLUCOPHAGE-XR) 500 MG 24 hr tablet Take 2 tablets by mouth 2 (two) times daily.  2  . Multiple Vitamin (MULTI-VITAMIN DAILY PO) Take 1 mL by mouth 1 day or 1 dose.    . Rivaroxaban 15 & 20 MG TBPK Take as directed on package: Start with one 15mg  tablet by mouth twice a day with food. On Day 22, switch to one 20mg  tablet once a day with food. 51 each 0   No current facility-administered medications for this visit.     OBJECTIVE: There were no vitals filed for this visit.   There is no height or weight on file to calculate BMI.    ECOG FS:0 - Asymptomatic  General: Well-developed, well-nourished, no acute distress. Eyes: Pink conjunctiva, anicteric sclera. HEENT: Normocephalic, moist mucous membranes, clear oropharnyx. Lungs: Clear to auscultation bilaterally. Heart: Regular rate and rhythm. No rubs, murmurs, or gallops. Abdomen: Soft, nontender, nondistended. No organomegaly noted, normoactive bowel sounds. Musculoskeletal: Mild left leg edema. Neuro: Alert, answering all questions appropriately. Cranial nerves grossly intact. Skin: No rashes or petechiae noted. Psych: Normal affect.   LAB RESULTS:  Lab Results  Component Value Date   NA 139 11/28/2016   K 3.2 (L) 11/28/2016   CL 99 (L) 11/28/2016   CO2 30 11/28/2016   GLUCOSE 97 11/28/2016   BUN 16 11/28/2016   CREATININE 0.64 11/28/2016   CALCIUM 9.4 11/28/2016   PROT 7.8 11/28/2016   ALBUMIN 4.1 11/28/2016   AST 25 11/28/2016   ALT 22 11/28/2016   ALKPHOS 87 11/28/2016   BILITOT 0.7 11/28/2016   GFRNONAA >60 11/28/2016   GFRAA >60 11/28/2016    Lab Results  Component Value Date   WBC 9.8 11/28/2016   NEUTROABS 6.3 11/28/2016   HGB 14.9 11/28/2016   HCT 43.0  11/28/2016   MCV 81.8 11/28/2016   PLT 208 11/28/2016     STUDIES: Ct Angio Chest Pe W/cm &/or Wo Cm  Result Date: 11/28/2016 CLINICAL DATA:  Left lower extremity swelling since this morning, headache for 2 days. EXAM: CT ANGIOGRAPHY CHEST WITH CONTRAST TECHNIQUE: Multidetector CT imaging of the chest was performed using the standard protocol during bolus administration of intravenous contrast. Multiplanar CT image reconstructions and MIPs were obtained to evaluate the vascular anatomy. CONTRAST:  75 cc Isovue 370 COMPARISON:  Chest CT dated 10/05/2016. FINDINGS: Cardiovascular: Moderate amount of nonocclusive pulmonary emboli within the distal portion of the left main pulmonary artery extending into multiple central segmental pulmonary artery branches to the left upper lobe, lingula and left lower lobe. Additional nearly occlusive pulmonary emboli at the junction of the distal right interlobar pulmonary artery and central segmental pulmonary artery branches to the right lower lobe, extending into multiple pulmonary artery branches to the right lower  lobe. Aortic atherosclerosis. No aortic aneurysm or dissection. Heart size is upper normal. No pericardial effusion. Coronary artery calcifications noted. Mediastinum/Nodes: Scattered small lymph nodes within the mediastinum, none of which are pathologic by CT size criteria. Mixed density lesion with calcifications within the right thyroid lobe measures 2 cm greatest dimension. Small hiatal hernia within the lower mediastinum. Lungs/Pleura: Spiculated pulmonary nodule within the right upper lobe is stable compared to chest CT of 10/05/2016 (series 6, image 39). The adjacent pulmonary nodules within the right upper lobe also appear stable in the short-term interval. No new lung findings in the short-term interval. No pleural effusion. Upper Abdomen: No acute findings. Musculoskeletal: No chest wall abnormality. No acute or significant osseous findings. Review of  the MIP images confirms the above findings. IMPRESSION: 1. Acute pulmonary emboli bilaterally, left greater than right, as detailed above. No evidence of associated right heart strain. No evidence of pulmonary infarction or other complicating feature. 2. Stable appearance of the spiculated pulmonary nodule within the right upper lobe compared to recent chest CT of 10/05/2016, again measuring 13 mm greatest dimension. Additional smaller pulmonary nodules also appear stable in the short-term interval. 3. As described on previous reports, partially calcified right thyroid nodule which is stable in size measuring approximately 2 cm greatest dimension. If not already performed, consider further characterization with thyroid ultrasound. 4. Aortic atherosclerosis. Aortic Atherosclerosis (ICD10-I70.0). Critical Value/emergent results were called by telephone at the time of interpretation on 11/28/2016 at 4:40 pm to Dr. Darel Hong , who verbally acknowledged these results. Electronically Signed   By: Franki Cabot M.D.   On: 11/28/2016 16:44   US Venous Img Lower Unilateral Left  Result Date: 11/28/2016 CLINICAL DATA:  Lower extremity pain and edema EXAM: LEFT LOWER EXTREMITY VENOUS DUPLEX ULTRASOUND TECHNIQUE: Gray-scale sonography with graded compression, as well as color Doppler and duplex ultrasound were performed to evaluate the left lower extremity deep venous system from the level of the common femoral vein and including the common femoral, femoral, profunda femoral, popliteal and calf veins including the posterior tibial, peroneal and gastrocnemius veins when visible. The superficial great saphenous vein was also interrogated. Spectral Doppler was utilized to evaluate flow at rest and with distal augmentation maneuvers in the common femoral, femoral and popliteal veins. COMPARISON:  None. FINDINGS: Contralateral Common Femoral Vein: Respiratory phasicity is normal and symmetric with the symptomatic side. No  evidence of thrombus. Normal compressibility. Common Femoral Vein: No evidence of thrombus. Normal compressibility, respiratory phasicity and response to augmentation. Saphenofemoral Junction: No evidence of thrombus. Normal compressibility and flow on color Doppler imaging. Profunda Femoral Vein: No evidence of thrombus. Normal compressibility and flow on color Doppler imaging. Femoral Vein: There is acute deep venous thrombosis throughout portions of this vessel with diminished but present Doppler signal indicating incomplete obstruction of the left femoral vein. There is loss of compressibility and augmentation in this vessel. Popliteal vein: There is occlusive thrombus in this vessel with loss of Doppler signal, augmentation, and compression. Calf Veins: There is occlusive thrombus throughout the calf veins. There is loss of compression augmentation. Superficial Great Saphenous Vein: No evidence of thrombus. Normal compressibility and flow on color Doppler imaging. Venous Reflux:  None. Other Findings:  None. IMPRESSION: Extensive left lower extremity deep venous thrombosis with occlusive thrombus in the popliteal and calf veins. Incompletely occlusive thrombus is noted in the left femoral vein. Thrombus in these vessels appears acute. More proximally, no deep venous thrombosis evident. Left common femoral vein patent. Electronically Signed  By: Lowella Grip III M.D.   On: 11/28/2016 13:55    ASSESSMENT: Mass of upper lobe of right lung, acute PE/left leg DVT.  PLAN:    1. Mass of upper lobe of right lung: PET scan results reviewed independently with a 2 cm spiculated nodule in the central right upper lobe highly suspicious for underlying malignancy. This, patient had a negative biopsy and her most recent imaging had improved. Continue with simple observation. Patient has a CT scan scheduled for January 04, 2017 and then follow-up with thoracic surgery following day. She also has follow-up with  pulmonary on February 05, 2017. 2. Acute bilateral PE/left leg DVT: No obvious transient risk factors. Hypercoagulable workup initiated today is mostly pending at time of dictation. No intervention is needed at this time. Patient will require a minimum of 6 months of anticoagulation with Xarelto. Return to clinic in 3 months for further evaluation and discussion of her hypercoagulable workup.  Approximately 30 minutes was spent in discussion of which greater than 50% was consultation.  Patient expressed understanding and was in agreement with this plan. She also understands that She can call clinic at any time with any questions, concerns, or complaints.   Cancer Staging No matching staging information was found for the patient.  Lloyd Huger, MD   12/03/2016 10:49 PM

## 2016-12-04 ENCOUNTER — Inpatient Hospital Stay: Payer: 59

## 2016-12-04 ENCOUNTER — Inpatient Hospital Stay: Payer: 59 | Attending: Oncology | Admitting: Oncology

## 2016-12-04 VITALS — BP 142/87 | HR 103 | Temp 100.7°F | Resp 18

## 2016-12-04 DIAGNOSIS — I824Z2 Acute embolism and thrombosis of unspecified deep veins of left distal lower extremity: Secondary | ICD-10-CM

## 2016-12-04 DIAGNOSIS — J45909 Unspecified asthma, uncomplicated: Secondary | ICD-10-CM | POA: Insufficient documentation

## 2016-12-04 DIAGNOSIS — G4730 Sleep apnea, unspecified: Secondary | ICD-10-CM | POA: Insufficient documentation

## 2016-12-04 DIAGNOSIS — Z79899 Other long term (current) drug therapy: Secondary | ICD-10-CM | POA: Insufficient documentation

## 2016-12-04 DIAGNOSIS — R918 Other nonspecific abnormal finding of lung field: Secondary | ICD-10-CM

## 2016-12-04 DIAGNOSIS — I700 Atherosclerosis of aorta: Secondary | ICD-10-CM | POA: Insufficient documentation

## 2016-12-04 DIAGNOSIS — K449 Diaphragmatic hernia without obstruction or gangrene: Secondary | ICD-10-CM | POA: Insufficient documentation

## 2016-12-04 DIAGNOSIS — R911 Solitary pulmonary nodule: Secondary | ICD-10-CM | POA: Diagnosis not present

## 2016-12-04 DIAGNOSIS — E119 Type 2 diabetes mellitus without complications: Secondary | ICD-10-CM | POA: Diagnosis not present

## 2016-12-04 DIAGNOSIS — I2510 Atherosclerotic heart disease of native coronary artery without angina pectoris: Secondary | ICD-10-CM | POA: Diagnosis not present

## 2016-12-04 DIAGNOSIS — I2699 Other pulmonary embolism without acute cor pulmonale: Secondary | ICD-10-CM | POA: Diagnosis not present

## 2016-12-04 DIAGNOSIS — I10 Essential (primary) hypertension: Secondary | ICD-10-CM | POA: Diagnosis not present

## 2016-12-04 DIAGNOSIS — K219 Gastro-esophageal reflux disease without esophagitis: Secondary | ICD-10-CM | POA: Diagnosis not present

## 2016-12-04 DIAGNOSIS — E041 Nontoxic single thyroid nodule: Secondary | ICD-10-CM | POA: Insufficient documentation

## 2016-12-04 DIAGNOSIS — Z87891 Personal history of nicotine dependence: Secondary | ICD-10-CM | POA: Insufficient documentation

## 2016-12-04 DIAGNOSIS — Z7984 Long term (current) use of oral hypoglycemic drugs: Secondary | ICD-10-CM | POA: Insufficient documentation

## 2016-12-04 LAB — ANTITHROMBIN III: ANTITHROMB III FUNC: 97 % (ref 75–120)

## 2016-12-05 LAB — PROTEIN C, TOTAL: Protein C, Total: 118 % (ref 60–150)

## 2016-12-05 LAB — HOMOCYSTEINE: HOMOCYSTEINE-NORM: 13.9 umol/L (ref 0.0–15.0)

## 2016-12-06 LAB — PROTEIN S ACTIVITY: PROTEIN S ACTIVITY: 109 % (ref 63–140)

## 2016-12-06 LAB — BETA-2-GLYCOPROTEIN I ABS, IGG/M/A
Beta-2 Glyco I IgG: <9 GPI IgG units (ref 0–20)
Beta-2-Glycoprotein I IgA: <9 GPI IgA units (ref 0–25)

## 2016-12-06 LAB — PROTEIN S, TOTAL: Protein S Ag, Total: 95 % (ref 60–150)

## 2016-12-06 LAB — PROTEIN C ACTIVITY: PROTEIN C ACTIVITY: 121 % (ref 73–180)

## 2016-12-07 LAB — CARDIOLIPIN ANTIBODIES, IGG, IGM, IGA
Anticardiolipin IgG: <9 GPL U/mL (ref 0–14)
Anticardiolipin IgM: <9 MPL U/mL (ref 0–12)

## 2016-12-08 LAB — LUPUS ANTICOAGULANT PANEL
DRVVT: 58.4 s — AB (ref 0.0–47.0)
PTT LA: 39.2 s (ref 0.0–51.9)

## 2016-12-08 LAB — DRVVT CONFIRM: dRVVT Confirm: 1.1 ratio (ref 0.8–1.2)

## 2016-12-08 LAB — DRVVT MIX: dRVVT Mix: 52.1 s — ABNORMAL HIGH (ref 0.0–47.0)

## 2016-12-09 LAB — FACTOR 5 LEIDEN

## 2016-12-11 LAB — PROTHROMBIN GENE MUTATION

## 2017-01-04 ENCOUNTER — Ambulatory Visit
Admission: RE | Admit: 2017-01-04 | Discharge: 2017-01-04 | Disposition: A | Discharge status: Home / Self Care | Payer: 59 | Source: Ambulatory Visit | Attending: Cardiothoracic Surgery | Admitting: Cardiothoracic Surgery

## 2017-01-04 ENCOUNTER — Ambulatory Visit: Admission: RE | Admit: 2017-01-04 | Payer: 59 | Source: Ambulatory Visit

## 2017-01-04 DIAGNOSIS — R911 Solitary pulmonary nodule: Secondary | ICD-10-CM | POA: Diagnosis present

## 2017-01-04 DIAGNOSIS — R918 Other nonspecific abnormal finding of lung field: Secondary | ICD-10-CM | POA: Insufficient documentation

## 2017-01-04 DIAGNOSIS — I700 Atherosclerosis of aorta: Secondary | ICD-10-CM | POA: Diagnosis not present

## 2017-01-05 ENCOUNTER — Encounter: Payer: Self-pay | Admitting: Cardiothoracic Surgery

## 2017-01-05 ENCOUNTER — Encounter (INDEPENDENT_AMBULATORY_CARE_PROVIDER_SITE_OTHER): Payer: Self-pay

## 2017-01-05 ENCOUNTER — Telehealth: Payer: Self-pay | Admitting: Internal Medicine

## 2017-01-05 ENCOUNTER — Other Ambulatory Visit: Payer: 59

## 2017-01-05 ENCOUNTER — Ambulatory Visit (INDEPENDENT_AMBULATORY_CARE_PROVIDER_SITE_OTHER): Payer: 59 | Admitting: Cardiothoracic Surgery

## 2017-01-05 VITALS — BP 131/83 | HR 80 | Temp 98.3°F | Wt 220.0 lb

## 2017-01-05 DIAGNOSIS — R911 Solitary pulmonary nodule: Secondary | ICD-10-CM

## 2017-01-05 NOTE — Telephone Encounter (Signed)
Patient's appt has been rescheduled from 9/17 to 9/12 at 2:15. Patient aware. Nothing further needed.

## 2017-01-05 NOTE — Patient Instructions (Signed)
Please go and have your CT scan and then we will follow up with you.

## 2017-01-05 NOTE — Progress Notes (Signed)
  Patient ID: Andrea Pollard, female   DOB: 1955-08-08, 61 y.o.   MRN: [id number]  HISTORY: She comes in today for follow-up of a right upper lobe lung nodule. In the interim since she was last seen she underwent a CT scan showing multiple bilateral pulmonary emboli and a complete workup for causes of DVT was unremarkable. She states that she has no shortness of breath at the present time. She is scheduled to follow-up with Dr. Grayland Ormond and Dr. Mortimer Fries within the next month for pulmonary embolism. She had a CT scan made and we compared this to her prior CT scans.   Vitals:   01/05/17 0943  BP: 131/83  Pulse: 80  Temp: 98.3 F (36.8 C)     EXAM:    Resp: Lungs are clear bilaterally.  No respiratory distress, normal effort. Heart:  Regular without murmurs Abd:  Abdomen is soft, non distended and non tender. No masses are palpable.  There is no rebound and no guarding.  Neurological: Alert and oriented to person, place, and time. Coordination normal.  Skin: Skin is warm and dry. No rash noted. No diaphoretic. No erythema. No pallor.  Psychiatric: Normal mood and affect. Normal behavior. Judgment and thought content normal.    ASSESSMENT: I have independently reviewed the patient's CT scan. There is still a spiculated right upper lobe nodule which again is concerning for malignancy. Going back in time since the beginning of this year the nodules essentially unchanged however. There are other scattered pulmonary nodules and areas that look like resolving pulmonary infarction.   PLAN:   I explained the patient that I'm still concerned that she may have a right upper lobe malignancy. She is scheduled to meet with Dr. Mortimer Fries to discuss a possible bronchoscopy again. She did have this done in the past and that was nondiagnostic. I told her at a minimum we would like to continue to follow this left set her up to have another CT scan in 3 months.    Nestor Lewandowsky, MD

## 2017-01-05 NOTE — Telephone Encounter (Signed)
Patient returning call from misty .   °

## 2017-01-31 ENCOUNTER — Ambulatory Visit (INDEPENDENT_AMBULATORY_CARE_PROVIDER_SITE_OTHER): Payer: 59 | Admitting: Internal Medicine

## 2017-01-31 ENCOUNTER — Encounter: Payer: Self-pay | Admitting: Internal Medicine

## 2017-01-31 VITALS — BP 136/86 | HR 85 | Resp 16 | Ht 65.0 in | Wt 219.0 lb

## 2017-01-31 DIAGNOSIS — R918 Other nonspecific abnormal finding of lung field: Secondary | ICD-10-CM

## 2017-01-31 NOTE — Progress Notes (Signed)
[name]      Date: 01/31/2017,   MRN# [id number] Andrea Pollard Nov 12, 1955 Code Status:  Code Status History    This patient does not have a recorded code status. Please follow your organizational policy for patients in this situation.         AdmissionWeight: 219 lb (99.3 kg)                 CurrentWeight: 219 lb (99.3 kg) Andrea Pollard is a 61 y.o. old female seen in consultation for RUL nodule at the request of Dr. Oleta Mouse.     CHIEF COMPLAINT:   Follow up lung nodules   HISTORY OF PRESENT ILLNESS  61 yo white female seen today for follow up abnormal CT chest/PET scan patient has been dx with ASTHMA in her 30's She has been on albuterol as needed It has been very  well controlled over the past 10 years Former smoker 25 years ago 1 ppd for 10 years    COPD noted on PFT's Ratio 71% predicted FEV1 is 1.73 L 68% predicted Positive bronchodilator response of FEV1 FEF 25/75 is 1.1 L which is 40% predicted  Radiographic history January 2018 CT chest shows right upper lobe nodule 2 x 1.3 cm in size February 2018 CT chest shows right upper lobe nodule 1.8 x 1.2 cm in size May 2018 CT chest shows right upperlobe nodule 1.3 x 1.0 cm in size July CT chest 2018 RUL  Lobe nodule 1.3x1.0 unchanged from previous CT chest +b/l PE was noted  August CT chest 2018 RUL lung nodule 1.3x1.0 cm unchanged from previous scan   Patient had undergone ENB which was nondiagnostic in Jan 2017 for the RUL nodule  In July 2018, patient dx with PE and DVT placed on Xaralto In August 2018 patient had cold-like symptoms and underwent a CT scan of her chest which shows the right upper lobe nodule has not changed in size over the last several CAT scans however there were 2 new nodular opacities in the left upper lobe and also in the right middle lobe which seem to be infectious at this time an inflammatory Patient received steroids in mid-August but did not  receive any antibiotics    She has no acute issues at this time Resp status back to normal Occasional SOB and wheezing with cough No signs of infection at this time No signs of acute heart failure at this time    Current Outpatient Prescriptions:  .  acetaminophen (TYLENOL) 500 MG tablet, Take 1,000 mg by mouth every 6 (six) hours as needed for mild pain., Disp: , Rfl:  .  albuterol (PROAIR HFA) 108 (90 Base) MCG/ACT inhaler, Inhale 2 Inhalers into the lungs as needed., Disp: , Rfl:  .  albuterol (PROVENTIL HFA;VENTOLIN HFA) 108 (90 Base) MCG/ACT inhaler, Inhale 2 puffs into the lungs every 6 (six) hours as needed for wheezing or shortness of breath. , Disp: , Rfl:  .  atorvastatin (LIPITOR) 80 MG tablet, Take 80 mg by mouth daily., Disp: , Rfl: 2 .  citalopram (CELEXA) 10 MG tablet, Take 10 mg by mouth daily., Disp: , Rfl: 2 .  FLOVENT HFA 220 MCG/ACT inhaler, Inhale 2 puffs into the lungs 2 (two) times daily., Disp: , Rfl: 5 .  fluticasone (FLONASE) 50 MCG/ACT nasal spray, Place 2 sprays into both nostrils 1 day or 1 dose., Disp: , Rfl:  .  hydrochlorothiazide (HYDRODIURIL) 12.5 MG tablet, Take 12.5 mg by  mouth daily., Disp: , Rfl:  .  lansoprazole (PREVACID) 15 MG capsule, Take 15 mg by mouth daily at 12 noon., Disp: , Rfl:  .  Melatonin 10 MG TABS, Take 10 mg by mouth at bedtime as needed (sleep)., Disp: , Rfl:  .  metFORMIN (GLUCOPHAGE-XR) 500 MG 24 hr tablet, Take 2 tablets by mouth 2 (two) times daily., Disp: , Rfl: 2 .  Multiple Vitamin (MULTI-VITAMIN DAILY PO), Take 1 mL by mouth 1 day or 1 dose., Disp: , Rfl:  .  XARELTO 20 MG TABS tablet, TAKE 1 TABLET BY MOUTH ONCE A DAY WITH FOOD, Disp: , Rfl: 0    ALLERGIES   Bactrim [sulfamethoxazole-trimethoprim]; Ibuprofen; Levaquin [levofloxacin in d5w]; and Nitroglycerin     REVIEW OF SYSTEMS   Review of Systems  Constitutional: Negative for chills, diaphoresis, fever, malaise/fatigue and weight loss.  HENT: Negative for  congestion and hearing loss.   Eyes: Negative for blurred vision and double vision.  Respiratory: Negative for cough, hemoptysis, sputum production, shortness of breath and wheezing.   Cardiovascular: Negative for chest pain, palpitations and orthopnea.  Gastrointestinal: Negative for abdominal pain, heartburn, nausea and vomiting.  Genitourinary: Negative for dysuria and urgency.  Musculoskeletal: Negative for myalgias and neck pain.  Skin: Negative for rash.  Neurological: Negative for dizziness, tingling, tremors, weakness and headaches.  Endo/Heme/Allergies: Does not bruise/bleed easily.  Psychiatric/Behavioral: Negative for depression, substance abuse and suicidal ideas.  All other systems reviewed and are negative.    VS: BP 136/86 (BP Location: Left Arm, Cuff Size: Normal)   Pulse 85   Resp 16   Ht 5\' 5"  (1.651 m)   Wt 219 lb (99.3 kg)   SpO2 95%   BMI 36.44 kg/m      PHYSICAL EXAM  Physical Exam  Constitutional: She is oriented to person, place, and time. She appears well-developed and well-nourished. No distress.  HENT:  Head: Normocephalic and atraumatic.  Mouth/Throat: No oropharyngeal exudate.  Eyes: Pupils are equal, round, and reactive to light. EOM are normal. No scleral icterus.  Neck: Normal range of motion. Neck supple.  Cardiovascular: Normal rate, regular rhythm and normal heart sounds.   No murmur heard. Pulmonary/Chest: No stridor. No respiratory distress. She has no wheezes.  Abdominal: Soft. Bowel sounds are normal.  Musculoskeletal: Normal range of motion. She exhibits no edema.  Neurological: She is alert and oriented to person, place, and time. No cranial nerve deficit.  Skin: Skin is warm. She is not diaphoretic.  Psychiatric: She has a normal mood and affect.         scan   ASSESSMENT/PLAN  61 yo pleasant white female with  Moderate COPD Gold stage A which seems to be controlled with her current regimen of Qvar, in the setting of obesity  and deconditioned state with an abnormal finding on CT scan with a right upper lobe nodular opacity status post ENB which was nondiagnostic   There are new nodular opacities in the right middle lobe as well as the left upper lobe which can be  associated with inflammatory and infectious process however she will need follow-up CAT scan in approximately 3 months to assess for resolution of the new nodular opacities   #1 shortness of breath likely from her obesity and her deconditioned state  #2 moderate COPD based on pulmonary function testing with FEV1 of 68% Continue Qvar as prescribed Albuterol as needed Avoid secondhand smoke  #3 right upper lobe nodular opacity After further assessment and review  of CT scans with the patient, the size of the right upper lobe nodule has decreased over the last 6 months  #4 Obesity -recommend significant weight loss -recommend changing diet  #5 Deconditioned state -Recommend increased daily activity and exercise   #6 right middle lobe nodular opacity and left upper lobe nodular opacity will need to further evaluate for interval changes with a repeat CT chest in approximately 3 months   Patient satisfied with Plan of action and management. All questions answered Follow-up in 3  months   Mylei Brackeen Patricia Pesa, M.D.  [facility]  Medical Director [place] Director [facility] Cardio-Pulmonary Department

## 2017-01-31 NOTE — Patient Instructions (Signed)
Follow up CT chest in 3 months

## 2017-02-05 ENCOUNTER — Ambulatory Visit: Payer: 59 | Admitting: Internal Medicine

## 2017-02-21 ENCOUNTER — Telehealth: Payer: Self-pay | Admitting: Internal Medicine

## 2017-02-21 NOTE — Telephone Encounter (Signed)
Continue as prescribed Follow up as scheduled

## 2017-02-21 NOTE — Telephone Encounter (Signed)
Please advise on message below in regards to Prednisone from PCP. Thanks.

## 2017-02-21 NOTE — Telephone Encounter (Signed)
Patient received rx for Prednisone 40 mg po x 5 days no taper from pcp office due to wheezing in R side lung   Patient wants to know if she needs to be seen or if she just needed to let Dr. Mortimer Fries know

## 2017-02-22 NOTE — Telephone Encounter (Signed)
Pt is returning your call

## 2017-02-22 NOTE — Telephone Encounter (Signed)
LMOM for pt to return call. 

## 2017-02-22 NOTE — Telephone Encounter (Signed)
Pt informed of response. Nothing further needed. 

## 2017-02-28 ENCOUNTER — Other Ambulatory Visit: Payer: Self-pay | Admitting: Internal Medicine

## 2017-02-28 DIAGNOSIS — Z1231 Encounter for screening mammogram for malignant neoplasm of breast: Secondary | ICD-10-CM

## 2017-03-04 DIAGNOSIS — I2699 Other pulmonary embolism without acute cor pulmonale: Secondary | ICD-10-CM | POA: Insufficient documentation

## 2017-03-04 NOTE — Progress Notes (Signed)
[facility]  [phone] [phone]  ID: [name] OB: [birth year]  MR#: [id number]  HEN#:[id number]  Patient Care Team: Ellamae Sia, MD as PCP - General (Internal Medicine) Minna Merritts, MD as Consulting Physician (Cardiology)  CHIEF COMPLAINT: Mass of upper lobe of right lung, DVT/PE.  INTERVAL HISTORY: Patient returns to clinic today or further evaluation and discussion of her laboratory results. She currently feels well and is asymptomatic. She is tolerating Xarelto well without significant side effects. She does not complain of shortness of breath today. She has no neurologic complaints. She denies any recent fevers. She has a good appetite and denies weight loss. She has no cough, chest pain, or hemoptysis. She denies any nausea, vomiting, constipation, or diarrhea. She has no urinary complaints. Patient offers no specific complaints today.  REVIEW OF SYSTEMS:   Review of Systems  Constitutional: Negative.  Negative for fever, malaise/fatigue and weight loss.  Respiratory: Negative for cough, hemoptysis and shortness of breath.   Cardiovascular: Negative.  Negative for chest pain and leg swelling.  Gastrointestinal: Negative.  Negative for abdominal pain.  Genitourinary: Negative.   Musculoskeletal: Negative.   Neurological: Negative.  Negative for sensory change and weakness.  Psychiatric/Behavioral: Negative.  The patient is not nervous/anxious and does not have insomnia.     As per HPI. Otherwise, a complete review of systems is negative.  PAST MEDICAL HISTORY: Past Medical History:  Diagnosis Date  . Asthma   . Complication of anesthesia   . Diabetes mellitus without complication ([place])   . GERD (gastroesophageal reflux disease)   . 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 History of methicillin resistant staphylococcus aureus (MRSA) 2013  . Hypertension   . Pneumonia 04/2016  . PONV (postoperative nausea and  vomiting)    NAUSEATED  . Sleep apnea    CPAP    PAST SURGICAL HISTORY: Past Surgical History:  Procedure Laterality Date  . ABDOMINAL HYSTERECTOMY    . CESAREAN SECTION    . CHOLECYSTECTOMY    . ELECTROMAGNETIC NAVIGATION BROCHOSCOPY N/A 08/15/2016   Procedure: ELECTROMAGNETIC NAVIGATION BRONCHOSCOPY;  Surgeon: Flora Lipps, MD;  Location: [facility] ORS;  Service: Cardiopulmonary;  Laterality: N/A;  . LAPAROSCOPIC GASTRIC SLEEVE RESECTION      FAMILY HISTORY: Family History  Problem Relation Age of Onset  . Stroke Father   . Ovarian cancer Maternal Grandmother     ADVANCED DIRECTIVES (Y/N):  N  HEALTH MAINTENANCE: Social History  Substance Use Topics  . Smoking status: Former Smoker    Packs/day: 1.00    Years: 5.00    Types: Cigarettes    Quit date: 08/12/1991  . Smokeless tobacco: Never Used  . Alcohol use No     Colonoscopy:  PAP:  Bone density:  Lipid panel:  Allergies  Allergen Reactions  . Bactrim [Sulfamethoxazole-Trimethoprim] Hives  . Ibuprofen Other (See Comments)    Cannot Use due to Gastric bypass  . Levaquin [Levofloxacin In D5w] Hives  . Nitroglycerin Other (See Comments)    Hypotension    Current Outpatient Prescriptions  Medication Sig Dispense Refill  . acetaminophen (TYLENOL) 500 MG tablet Take 1,000 mg by mouth every 6 (six) hours as needed for mild pain.    Marland Kitchen albuterol (PROAIR HFA) 108 (90 Base) MCG/ACT inhaler Inhale 2 Inhalers into the lungs as needed.    Marland Kitchen albuterol (PROVENTIL HFA;VENTOLIN HFA) 108 (90 Base) MCG/ACT inhaler Inhale 2 puffs into the lungs every 6 (six) hours as needed for  wheezing or shortness of breath.     Marland Kitchen atorvastatin (LIPITOR) 80 MG tablet Take 80 mg by mouth daily.  2  . citalopram (CELEXA) 10 MG tablet Take 10 mg by mouth daily.  2  . FLOVENT HFA 220 MCG/ACT inhaler Inhale 2 puffs into the lungs 2 (two) times daily.  5  . fluticasone (FLONASE) 50 MCG/ACT nasal spray Place 2 sprays into both nostrils 1 day or 1 dose.     . hydrochlorothiazide (HYDRODIURIL) 12.5 MG tablet Take 12.5 mg by mouth daily.    . lansoprazole (PREVACID) 15 MG capsule Take 15 mg by mouth daily at 12 noon.    . Melatonin 10 MG TABS Take 10 mg by mouth at bedtime as needed (sleep).    . metFORMIN (GLUCOPHAGE-XR) 500 MG 24 hr tablet Take 2 tablets by mouth 2 (two) times daily.  2  . Multiple Vitamin (MULTI-VITAMIN DAILY PO) Take 1 mL by mouth 1 day or 1 dose.    Marland Kitchen XARELTO 20 MG TABS tablet TAKE 1 TABLET BY MOUTH ONCE A DAY WITH FOOD  0   No current facility-administered medications for this visit.     OBJECTIVE: Vitals:   03/06/17 1435  BP: (!) 142/90  Pulse: 74  Resp: 18  Temp: 98.3 F (36.8 C)     Body mass index is 36.28 kg/m.    ECOG FS:0 - Asymptomatic  General: Well-developed, well-nourished, no acute distress. Eyes: Pink conjunctiva, anicteric sclera. Lungs: Clear to auscultation bilaterally. Heart: Regular rate and rhythm. No rubs, murmurs, or gallops. Abdomen: Soft, nontender, nondistended. No organomegaly noted, normoactive bowel sounds. Musculoskeletal: Mild left leg edema. Neuro: Alert, answering all questions appropriately. Cranial nerves grossly intact. Skin: No rashes or petechiae noted. Psych: Normal affect.   LAB RESULTS:  Lab Results  Component Value Date   NA 139 11/28/2016   K 3.2 (L) 11/28/2016   CL 99 (L) 11/28/2016   CO2 30 11/28/2016   GLUCOSE 97 11/28/2016   BUN 16 11/28/2016   CREATININE 0.64 11/28/2016   CALCIUM 9.4 11/28/2016   PROT 7.8 11/28/2016   ALBUMIN 4.1 11/28/2016   AST 25 11/28/2016   ALT 22 11/28/2016   ALKPHOS 87 11/28/2016   BILITOT 0.7 11/28/2016   GFRNONAA >60 11/28/2016   GFRAA >60 11/28/2016    Lab Results  Component Value Date   WBC 9.8 11/28/2016   NEUTROABS 6.3 11/28/2016   HGB 14.9 11/28/2016   HCT 43.0 11/28/2016   MCV 81.8 11/28/2016   PLT 208 11/28/2016     STUDIES: No results found.  ASSESSMENT: Mass of upper lobe of right lung, acute PE/left  leg DVT.  PLAN:    1. Mass of upper lobe of right lung: CT scan results from January 04, 2017 reviewed independently with essentially unchanged 2 cm spiculated nodule. Previously, patient had a negative biopsy. She has a repeat CT scan scheduled for April 09, 2017. Continue to follow-up with pulmonary and thoracic surgery as scheduled. 2. Acute bilateral PE/left leg DVT: Diagnosed by CT scan on November 28, 2016. Patient has no obvious transient risk factors. Hypercoagulable workup is negative except for an elevated DRVVT which can be attributed to her anticoagulation. No intervention is needed at this time. Patient will require a minimum of 6 months of anticoagulation with Xarelto. Return to clinic in 3 months for further evaluation. 3. Disability: Patient was given a letter today allowing her to return to work as tolerated.  Approximately 20 minutes was spent in discussion  of which greater than 50% was consultation.  Patient expressed understanding and was in agreement with this plan. She also understands that She can call clinic at any time with any questions, concerns, or complaints.   Cancer Staging No matching staging information was found for the patient.  Lloyd Huger, MD   03/06/2017 2:52 PM

## 2017-03-06 ENCOUNTER — Inpatient Hospital Stay: Payer: 59 | Attending: Oncology | Admitting: Oncology

## 2017-03-06 VITALS — BP 142/90 | HR 74 | Temp 98.3°F | Resp 18 | Wt 218.0 lb

## 2017-03-06 DIAGNOSIS — G4730 Sleep apnea, unspecified: Secondary | ICD-10-CM | POA: Diagnosis not present

## 2017-03-06 DIAGNOSIS — E119 Type 2 diabetes mellitus without complications: Secondary | ICD-10-CM | POA: Diagnosis not present

## 2017-03-06 DIAGNOSIS — Z79899 Other long term (current) drug therapy: Secondary | ICD-10-CM | POA: Diagnosis not present

## 2017-03-06 DIAGNOSIS — I10 Essential (primary) hypertension: Secondary | ICD-10-CM | POA: Diagnosis not present

## 2017-03-06 DIAGNOSIS — K219 Gastro-esophageal reflux disease without esophagitis: Secondary | ICD-10-CM | POA: Insufficient documentation

## 2017-03-06 DIAGNOSIS — R918 Other nonspecific abnormal finding of lung field: Secondary | ICD-10-CM | POA: Diagnosis not present

## 2017-03-06 DIAGNOSIS — J45909 Unspecified asthma, uncomplicated: Secondary | ICD-10-CM | POA: Insufficient documentation

## 2017-03-06 DIAGNOSIS — Z87891 Personal history of nicotine dependence: Secondary | ICD-10-CM | POA: Insufficient documentation

## 2017-03-06 DIAGNOSIS — Z7984 Long term (current) use of oral hypoglycemic drugs: Secondary | ICD-10-CM | POA: Diagnosis not present

## 2017-03-06 DIAGNOSIS — Z86718 Personal history of other venous thrombosis and embolism: Secondary | ICD-10-CM | POA: Diagnosis not present

## 2017-03-06 DIAGNOSIS — Z7901 Long term (current) use of anticoagulants: Secondary | ICD-10-CM | POA: Diagnosis not present

## 2017-03-06 DIAGNOSIS — I2699 Other pulmonary embolism without acute cor pulmonale: Secondary | ICD-10-CM

## 2017-03-06 NOTE — Progress Notes (Signed)
Pt in for follow up states doing well.  Inquiring on whether she can go back to work or not.

## 2017-03-09 ENCOUNTER — Emergency Department
Admission: EM | Admit: 2017-03-09 | Discharge: 2017-03-09 | Disposition: A | Discharge status: Home / Self Care | Payer: 59 | Attending: Emergency Medicine | Admitting: Emergency Medicine

## 2017-03-09 ENCOUNTER — Encounter: Payer: Self-pay | Admitting: Emergency Medicine

## 2017-03-09 ENCOUNTER — Emergency Department: Payer: 59

## 2017-03-09 DIAGNOSIS — J45909 Unspecified asthma, uncomplicated: Secondary | ICD-10-CM | POA: Insufficient documentation

## 2017-03-09 DIAGNOSIS — S6392XA Sprain of unspecified part of left wrist and hand, initial encounter: Secondary | ICD-10-CM

## 2017-03-09 DIAGNOSIS — Z79899 Other long term (current) drug therapy: Secondary | ICD-10-CM | POA: Diagnosis not present

## 2017-03-09 DIAGNOSIS — E119 Type 2 diabetes mellitus without complications: Secondary | ICD-10-CM | POA: Diagnosis not present

## 2017-03-09 DIAGNOSIS — Z7984 Long term (current) use of oral hypoglycemic drugs: Secondary | ICD-10-CM | POA: Insufficient documentation

## 2017-03-09 DIAGNOSIS — S59912A Unspecified injury of left forearm, initial encounter: Secondary | ICD-10-CM | POA: Diagnosis present

## 2017-03-09 DIAGNOSIS — Y9301 Activity, walking, marching and hiking: Secondary | ICD-10-CM | POA: Insufficient documentation

## 2017-03-09 DIAGNOSIS — Z7901 Long term (current) use of anticoagulants: Secondary | ICD-10-CM | POA: Insufficient documentation

## 2017-03-09 DIAGNOSIS — Y999 Unspecified external cause status: Secondary | ICD-10-CM | POA: Diagnosis not present

## 2017-03-09 DIAGNOSIS — I10 Essential (primary) hypertension: Secondary | ICD-10-CM | POA: Insufficient documentation

## 2017-03-09 DIAGNOSIS — Y929 Unspecified place or not applicable: Secondary | ICD-10-CM | POA: Diagnosis not present

## 2017-03-09 DIAGNOSIS — S63502A Unspecified sprain of left wrist, initial encounter: Secondary | ICD-10-CM | POA: Diagnosis not present

## 2017-03-09 DIAGNOSIS — Z87891 Personal history of nicotine dependence: Secondary | ICD-10-CM | POA: Diagnosis not present

## 2017-03-09 DIAGNOSIS — W010XXA Fall on same level from slipping, tripping and stumbling without subsequent striking against object, initial encounter: Secondary | ICD-10-CM | POA: Diagnosis not present

## 2017-03-09 DIAGNOSIS — I2510 Atherosclerotic heart disease of native coronary artery without angina pectoris: Secondary | ICD-10-CM | POA: Diagnosis not present

## 2017-03-09 DIAGNOSIS — M25532 Pain in left wrist: Secondary | ICD-10-CM

## 2017-03-09 MED ORDER — ACETAMINOPHEN 325 MG PO TABS
650.0000 mg | ORAL_TABLET | Freq: Once | ORAL | Status: AC
  Administered 2017-03-09: 650 mg via ORAL
  Filled 2017-03-09: qty 2

## 2017-03-09 MED ORDER — TRAMADOL HCL 50 MG PO TABS: 50.0000 mg | ORAL_TABLET | Freq: Four times a day (QID) | ORAL | 0 refills | Status: DC | PRN

## 2017-03-09 NOTE — Discharge Instructions (Signed)
APPLY ICE TO THE WRIST AND HAND AT LEAST 3 TIMES A DAY. KEEP THE HAND ELEVATED AS MUCH AS POSSIBLE. IF YOU'RE NOT BETTER IN 5-7 DAYS, PLEASE CALL [facility]. THE NUMBER IS LISTED ON YOUR PAPERS.HIS MEDICATIONS AS PRESCRIBED

## 2017-03-09 NOTE — ED Provider Notes (Signed)
[facility] Emergency Department Provider Note  ____________________________________________   First MD Initiated Contact with Patient 03/09/17 1652     (approximate)  I have reviewed the triage vital signs and the nursing notes.   HISTORY  Chief Complaint Arm Injury    HPI Andrea Pollard is a 61 y.o. female Complaining of left hand and wrist pain. States she tripped over a baby gate yesterday and landed on her hand and wrist. [facility]. She states the x-ray wasn't very clear and she doesn't trust what they did. They did put a splint on her but she doesn't think is the right one because it doesn't support her thumb as the provider had said it would.  she would like another x-ray she doesn't feel comfortable with the one they did.states the hand is swollen and the pain is across the middle part of the hand towards the thumb and at the wrist. Denies numbness or tingling. Denies any other injuries   Past Medical History:  Diagnosis Date  . Asthma   . Complication of anesthesia   . Diabetes mellitus without complication ([place])   . GERD (gastroesophageal reflux disease)   . Hepatitis    AGE 59-HEPATITIS B  . History of methicillin resistant staphylococcus aureus (MRSA) 2013  . Hypertension   . Pneumonia 04/2016  . PONV (postoperative nausea and vomiting)    NAUSEATED  . Sleep apnea    CPAP    Patient Active Problem List   Diagnosis Date Noted  . Pulmonary embolism ([place]) 03/04/2017  . Benign hypertension 10/05/2016  . Constipation 10/05/2016  . Gastroesophageal reflux disease 10/05/2016  . Hypercholesterolemia 10/05/2016  . Indigestion 10/05/2016  . Obstructive sleep apnea of adult 10/05/2016  . Type 2 diabetes mellitus ([place]) 10/05/2016  . Vitamin D deficiency 10/05/2016  . Coronary artery disease of native artery of native heart with stable angina pectoris ([place]) 09/27/2016  . Aortic atherosclerosis  ([place]) 09/27/2016  . History of smoking 09/27/2016  . Mass of upper lobe of left lung 06/07/2016  . Morbid obesity ([place]) 04/23/2014    Past Surgical History:  Procedure Laterality Date  . ABDOMINAL HYSTERECTOMY    . CESAREAN SECTION    . CHOLECYSTECTOMY    . ELECTROMAGNETIC NAVIGATION BROCHOSCOPY N/A 08/15/2016   Procedure: ELECTROMAGNETIC NAVIGATION BRONCHOSCOPY;  Surgeon: Flora Lipps, MD;  Location: [facility] ORS;  Service: Cardiopulmonary;  Laterality: N/A;  . LAPAROSCOPIC GASTRIC SLEEVE RESECTION      Prior to Admission medications   Medication Sig Start Date End Date Taking? Authorizing Provider  acetaminophen (TYLENOL) 500 MG tablet Take 1,000 mg by mouth every 6 (six) hours as needed for mild pain.    [provider]  albuterol (PROAIR HFA) 108 (90 Base) MCG/ACT inhaler Inhale 2 Inhalers into the lungs as needed.    [provider]  albuterol (PROVENTIL HFA;VENTOLIN HFA) 108 (90 Base) MCG/ACT inhaler Inhale 2 puffs into the lungs every 6 (six) hours as needed for wheezing or shortness of breath.     [provider]  atorvastatin (LIPITOR) 80 MG tablet Take 80 mg by mouth daily. 08/30/16   [provider]  citalopram (CELEXA) 10 MG tablet Take 10 mg by mouth daily. 10/11/16   [provider]  FLOVENT HFA 220 MCG/ACT inhaler Inhale 2 puffs into the lungs 2 (two) times daily. 12/11/16   [provider]  fluticasone (FLONASE) 50 MCG/ACT nasal spray Place 2 sprays  into both nostrils 1 day or 1 dose.    [provider]  hydrochlorothiazide (HYDRODIURIL) 12.5 MG tablet Take 12.5 mg by mouth daily.    [provider]  lansoprazole (PREVACID) 15 MG capsule Take 15 mg by mouth daily at 12 noon.    [provider]  Melatonin 10 MG TABS Take 10 mg by mouth at bedtime as needed (sleep).    [provider]  metFORMIN (GLUCOPHAGE-XR) 500 MG 24 hr tablet Take 2 tablets by mouth 2 (two) times daily. 03/23/16   [provider]  Multiple Vitamin (MULTI-VITAMIN DAILY PO) Take 1 mL by mouth 1 day or 1 dose.    [provider]  traMADol (ULTRAM) 50 MG tablet Take 1 tablet (50 mg total) by mouth every 6 (six) hours as needed. 03/09/17 03/09/18  Teia Freitas, Linden Dolin, PA-C  XARELTO 20 MG TABS tablet TAKE 1 TABLET BY MOUTH ONCE A DAY WITH FOOD 11/29/16   [provider]    Allergies Bactrim [sulfamethoxazole-trimethoprim]; Ibuprofen; Levaquin [levofloxacin in d5w]; and Nitroglycerin  Family History  Problem Relation Age of Onset  . Stroke Father   . Ovarian cancer Maternal Grandmother     Social History Social History  Substance Use Topics  . Smoking status: Former Smoker    Packs/day: 1.00    Years: 5.00    Types: Cigarettes    Quit date: 08/12/1991  . Smokeless tobacco: Never Used  . Alcohol use No    Review of Systems  Constitutional: No fever/chills Eyes: No visual changes. ENT: No sore throat. Respiratory: Denies cough Genitourinary: Negative for dysuria. Musculoskeletal: Negative for back pain.positive hand and wrist pain Skin: Negative for rash.    ____________________________________________   PHYSICAL EXAM:  VITAL SIGNS: ED Triage Vitals  Enc Vitals Group     BP 03/09/17 1602 (!) 147/76     Pulse Rate 03/09/17 1602 71     Resp 03/09/17 1602 20     Temp 03/09/17 1602 98 F (36.7 C)     Temp Source 03/09/17 1602 Oral     SpO2 03/09/17 1602 99 %     Weight 03/09/17 1603 221 lb (100.2 kg)     Height 03/09/17 1603 5\' 5"  (1.651 m)     Head Circumference --      Peak Flow --      Pain Score 03/09/17 1602 8     Pain Loc --      Pain Edu? --      Excl. in [place]? --     Constitutional: Alert and oriented. Well appearing and in no acute distress. Eyes: Conjunctivae are normal.  Head: Atraumatic. Nose: No congestion/rhinnorhea. Mouth/Throat: Mucous membranes are moist.   Cardiovascular: Normal rate, regular rhythm. Respiratory: Normal respiratory effort.  No  retractions GU: deferred Musculoskeletal: FROM all extremities, warm and well perfused, pain is reproduced with motion of the left hand and wrist. Bony tenderness at the left wrist along the radial aspect. Bony tenderness in the hand along the second and third metacarpals. Slight swelling and bruising noted at the second metacarpal.  Neurovascular intact Neurologic:  Normal speech and language.  Skin:  Skin is warm, dry and intact. No rash noted. Bruising at second metacarpal Psychiatric: Mood and affect are normal. Speech and behavior are normal.  ____________________________________________   LABS (all labs ordered are listed, but only abnormal results are displayed)  Labs Reviewed - No data to display ____________________________________________   ____________________________________________  RADIOLOGY  Diagnostic xray of  left hand IS NEGATIVE FOR FRACTURE  ____________________________________________   PROCEDURES  Procedure(s) performed: No      ____________________________________________   INITIAL IMPRESSION / ASSESSMENT AND PLAN / ED COURSE  Pertinent labs & imaging results that were available during my care of the patient were reviewed by me and considered in my medical decision making (see chart for details).  Well 61 year old female. Questionable broken hand versus distal radius. X-ray ordered. ----------------------------------------- 5:56 PM on 03/09/2017 -----------------------------------------  Sprained left hand and wrist. Tech  Applied a thumb spica splint. Gave the patient a sling to keep the arm elevated. Told her to apply ice. Gave her Tylenol while in the emergency department as she is driving. Prescription for tramadol 50 mg #20 no refill. She is to follow-up with orthopedics. She requested [facility].  Phone number was given.She is to return to the emergency department if worsening.       ____________________________________________   FINAL CLINICAL IMPRESSION(S) / ED DIAGNOSES  Final diagnoses:  Left wrist pain  Sprain of left hand, initial encounter      NEW MEDICATIONS STARTED DURING THIS VISIT:  New Prescriptions   TRAMADOL (ULTRAM) 50 MG TABLET    Take 1 tablet (50 mg total) by mouth every 6 (six) hours as needed.     Note:  This document was prepared using Dragon voice recognition software and may include unintentional dictation errors.    Versie Starks, PA-C 03/09/17 Sanjuana Mae, MD 03/09/17 [phone]

## 2017-03-09 NOTE — ED Triage Notes (Signed)
Pt arrives to the ER with complaints of left arm pain. She states that she tripped over a baby gait and landed on her left arm with all her weight. She reports that she went to the Urgent care before coming they had to read a paper on how to position her hand for her xray. She doesn't feel confident with their care. They did place a splint on her left arm. No swelling or deformity seen. She is able to move her fingers.

## 2017-03-14 ENCOUNTER — Ambulatory Visit
Admission: RE | Admit: 2017-03-14 | Discharge: 2017-03-14 | Disposition: A | Discharge status: Home / Self Care | Payer: 59 | Source: Ambulatory Visit | Attending: Internal Medicine | Admitting: Internal Medicine

## 2017-03-14 DIAGNOSIS — Z1231 Encounter for screening mammogram for malignant neoplasm of breast: Secondary | ICD-10-CM | POA: Insufficient documentation

## 2017-04-09 ENCOUNTER — Ambulatory Visit
Admission: RE | Admit: 2017-04-09 | Discharge: 2017-04-09 | Disposition: A | Discharge status: Home / Self Care | Payer: 59 | Source: Ambulatory Visit | Attending: Cardiothoracic Surgery | Admitting: Cardiothoracic Surgery

## 2017-04-09 ENCOUNTER — Ambulatory Visit: Payer: 59

## 2017-04-09 DIAGNOSIS — R918 Other nonspecific abnormal finding of lung field: Secondary | ICD-10-CM | POA: Insufficient documentation

## 2017-04-09 DIAGNOSIS — I700 Atherosclerosis of aorta: Secondary | ICD-10-CM | POA: Diagnosis not present

## 2017-04-09 DIAGNOSIS — R911 Solitary pulmonary nodule: Secondary | ICD-10-CM | POA: Diagnosis present

## 2017-04-16 ENCOUNTER — Other Ambulatory Visit: Payer: Self-pay

## 2017-04-20 ENCOUNTER — Telehealth: Payer: Self-pay

## 2017-04-20 ENCOUNTER — Other Ambulatory Visit: Payer: Self-pay

## 2017-04-20 ENCOUNTER — Ambulatory Visit (INDEPENDENT_AMBULATORY_CARE_PROVIDER_SITE_OTHER): Payer: 59 | Admitting: Cardiothoracic Surgery

## 2017-04-20 ENCOUNTER — Encounter: Payer: Self-pay | Admitting: Cardiothoracic Surgery

## 2017-04-20 VITALS — BP 132/81 | HR 80 | Temp 97.8°F | Resp 18 | Ht 65.0 in | Wt 222.8 lb

## 2017-04-20 DIAGNOSIS — R911 Solitary pulmonary nodule: Secondary | ICD-10-CM

## 2017-04-20 NOTE — Telephone Encounter (Signed)
Spoke with [employer] at [facility] and verbalized that I needed to a CT Chest without contrast scheduled for within the next 3 months. She was able to get the patient an appointment for 2/28 at  10:00AM at the [facility]. Patient is to arrive at arrive at 9:45 I will call patient and advised.   Call made to patient at this time and I advised her of her CT Scan being scheduled for 2/28 at 9:45AM at the [place] [facility]. Patient verbalized understanding at this time.

## 2017-04-20 NOTE — Telephone Encounter (Signed)
Left message for Andrea Pollard to return call to schedule Ct guided lung biopsy next week.  Patient also needs follow up with Dr.Oaks afterwards.

## 2017-04-20 NOTE — Progress Notes (Signed)
  Patient ID: Andrea Pollard, female   DOB: 12/02/1955, 61 y.o.   MRN: [id number]  HISTORY: This patient returns today in follow-up.  She has multiple bilateral pulmonary nodules.  There is a nodule in the right middle lobe that is most concerning.  She has been followed since January of this year and this nodule is essentially unchanged since that timeframe.  However she is quite concerned about the possibility this may represent a malignancy and certainly that is possible.  She did undergo bronchoscopy in the past which was negative.  She is now requesting a CT-guided needle biopsy.  In the interim since she was last seen she had one episode of severe bronchitis and asthma requiring prednisone and antibiotics.  She is currently remains on the antibiotics.  She does get short of breath with some activities.   Vitals:   04/20/17 0817  BP: 132/81  Pulse: 80  Resp: 18  Temp: 97.8 F (36.6 C)  SpO2: 96%     EXAM:    Resp: Lungs are clear bilaterally.  No respiratory distress, normal effort. Heart:  Regular without murmurs Abd:  Abdomen is soft, non distended and non tender. No masses are palpable.  There is no rebound and no guarding.  Neurological: Alert and oriented to person, place, and time. Coordination normal.  Skin: Skin is warm and dry. No rash noted. No diaphoretic. No erythema. No pallor.  Psychiatric: Normal mood and affect. Normal behavior. Judgment and thought content normal.    ASSESSMENT: I have independently reviewed the patient's CT scan and compared to all of her prior scans.  The nodule in the right middle lobe is certainly stellate and spiculated which is worrisome for malignancy.  There are scattered other nodules throughout the lungs that are unchanged.  I explained to the patient that certainly a malignancy still within the realm of possibility even though the nodule is unchanged over the last 10 months and even though she had a negative bronchoscopy.   PLAN:    She does not want to pursue surgical therapy.  She has asked if I can discuss a CT-guided needle biopsy with our colleagues.  She also asked if that was not possible to do a bronchoscopy be performed again.  At the present time I would not recommend surgical intervention unless a diagnosis of malignancy was established.  I will ask for a CT-guided needle biopsy.  The patient will contact me next week to discuss this.    Nestor Lewandowsky, MD

## 2017-04-24 ENCOUNTER — Telehealth: Payer: Self-pay | Admitting: Internal Medicine

## 2017-04-24 ENCOUNTER — Telehealth: Payer: Self-pay

## 2017-04-24 NOTE — Telephone Encounter (Signed)
Andrea Pollard,  Pt has questions about her CT Biospy if you will please give her a call.  Thanks,

## 2017-04-24 NOTE — Telephone Encounter (Signed)
   Left message for patient to return call regarding the 2 appointments listed below.    Dr.Oaks 05/09/17 @ 9 am to discuss CT guided biopsy.   Dr.Oaks3/1/19 to discuss CT Chest 3 month follow up.

## 2017-04-24 NOTE — Telephone Encounter (Signed)
Please call pt regarding CT biopsy.

## 2017-04-24 NOTE — Telephone Encounter (Signed)
Call made to patient at this time. Left a message advising patient that we have scheduling her CT Guided Biopsy for 12/11 at 8:30 with a 7:30 arrival time at the [facility]. I did verbalized that she call back if she had any questions or concerns.

## 2017-04-24 NOTE — Telephone Encounter (Signed)
I spoke with the patient and she actually has a question for Dr. Mortimer Fries. Pt recently evaluated by Dr. Genevive Bi after repeat chest CT. Dr. Genevive Bi has ordered CT guided biopsy of RML lung nodule as seen on previous CT scan and pt is curious if Dr. Mortimer Fries could biopsy this area by bronchoscopy versus CT guidance. Pt is very anxious about having CT guided biopsy performed and wanted Dr. Zoila Shutter recommendation regarding how to biopsy lung nodule seen on recent CT scan.    Let me know when you get a chance and I will be happy to call the patient back.   Thanks!

## 2017-04-24 NOTE — Telephone Encounter (Signed)
Please advise on message below.

## 2017-04-30 ENCOUNTER — Other Ambulatory Visit: Payer: Self-pay | Admitting: General Surgery

## 2017-05-01 ENCOUNTER — Ambulatory Visit: Admission: RE | Admit: 2017-05-01 | Payer: 59 | Source: Ambulatory Visit

## 2017-05-02 ENCOUNTER — Telehealth: Payer: Self-pay

## 2017-05-02 NOTE — Telephone Encounter (Signed)
Left message for patient to return call regarding cancellation of CT guided biopsy per patient.

## 2017-05-02 NOTE — Telephone Encounter (Signed)
Patient cancelled CT guided biopsy for multiple reasons.  She is sick with cold, coughing and sneezing, sore throat and didn't feel she could be still during the procedure.  She was told that the area of concern is close to her heart and she is scared to move forward with the biopsy due to something happening to her heart.  She is very scared to have the CT guided biopsy.  She is wondering if Dr.Oaks could call her today.

## 2017-05-09 ENCOUNTER — Ambulatory Visit: Payer: 59 | Admitting: Cardiothoracic Surgery

## 2017-05-20 ENCOUNTER — Encounter: Payer: Self-pay | Admitting: Emergency Medicine

## 2017-05-20 ENCOUNTER — Emergency Department
Admission: EM | Admit: 2017-05-20 | Discharge: 2017-05-20 | Disposition: A | Discharge status: Home / Self Care | Payer: 59 | Attending: Student in an Organized Health Care Education/Training Program | Admitting: Student in an Organized Health Care Education/Training Program

## 2017-05-20 ENCOUNTER — Other Ambulatory Visit: Payer: Self-pay

## 2017-05-20 ENCOUNTER — Emergency Department: Payer: 59

## 2017-05-20 DIAGNOSIS — I2510 Atherosclerotic heart disease of native coronary artery without angina pectoris: Secondary | ICD-10-CM | POA: Diagnosis not present

## 2017-05-20 DIAGNOSIS — E119 Type 2 diabetes mellitus without complications: Secondary | ICD-10-CM | POA: Insufficient documentation

## 2017-05-20 DIAGNOSIS — Z7982 Long term (current) use of aspirin: Secondary | ICD-10-CM | POA: Insufficient documentation

## 2017-05-20 DIAGNOSIS — Z87891 Personal history of nicotine dependence: Secondary | ICD-10-CM | POA: Insufficient documentation

## 2017-05-20 DIAGNOSIS — Z794 Long term (current) use of insulin: Secondary | ICD-10-CM | POA: Insufficient documentation

## 2017-05-20 DIAGNOSIS — N390 Urinary tract infection, site not specified: Secondary | ICD-10-CM | POA: Diagnosis present

## 2017-05-20 DIAGNOSIS — Z79899 Other long term (current) drug therapy: Secondary | ICD-10-CM | POA: Diagnosis not present

## 2017-05-20 DIAGNOSIS — J45909 Unspecified asthma, uncomplicated: Secondary | ICD-10-CM | POA: Diagnosis not present

## 2017-05-20 DIAGNOSIS — I10 Essential (primary) hypertension: Secondary | ICD-10-CM | POA: Insufficient documentation

## 2017-05-20 LAB — URINALYSIS, COMPLETE (UACMP) WITH MICROSCOPIC
Bacteria, UA: NONE SEEN
Specific Gravity, Urine: 1.022 (ref 1.005–1.030)

## 2017-05-20 MED ORDER — DEXTROSE 5 % IV SOLN
1.0000 g | Freq: Once | INTRAVENOUS | Status: AC
  Administered 2017-05-20: 1 g via INTRAVENOUS
  Filled 2017-05-20: qty 10

## 2017-05-20 MED ORDER — SODIUM CHLORIDE 0.9 % IV BOLUS (SEPSIS)
1000.0000 mL | Freq: Once | INTRAVENOUS | Status: AC
  Administered 2017-05-20: 1000 mL via INTRAVENOUS

## 2017-05-20 MED ORDER — ONDANSETRON 8 MG PO TBDP
8.0000 mg | ORAL_TABLET | Freq: Once | ORAL | Status: AC
  Administered 2017-05-20: 8 mg via ORAL
  Filled 2017-05-20: qty 1

## 2017-05-20 MED ORDER — ONDANSETRON 4 MG PO TBDP: ORAL_TABLET | ORAL | 0 refills | Status: DC

## 2017-05-20 MED ORDER — ONDANSETRON HCL 4 MG/2ML IJ SOLN
4.0000 mg | Freq: Once | INTRAMUSCULAR | Status: DC
  Filled 2017-05-20: qty 2

## 2017-05-20 MED ORDER — ONDANSETRON 8 MG PO TBDP: 8.0000 mg | ORAL_TABLET | Freq: Once | ORAL | Status: DC

## 2017-05-20 MED ORDER — FENTANYL CITRATE (PF) 100 MCG/2ML IJ SOLN
50.0000 ug | Freq: Once | INTRAMUSCULAR | Status: AC
  Administered 2017-05-20: 50 ug via INTRAVENOUS
  Filled 2017-05-20: qty 2

## 2017-05-20 MED ORDER — METOCLOPRAMIDE HCL 5 MG/ML IJ SOLN
10.0000 mg | Freq: Once | INTRAMUSCULAR | Status: AC
  Administered 2017-05-20: 10 mg via INTRAVENOUS
  Filled 2017-05-20: qty 2

## 2017-05-20 MED ORDER — HYDROCODONE-ACETAMINOPHEN 5-325 MG PO TABS: 1.0000 | ORAL_TABLET | Freq: Four times a day (QID) | ORAL | 0 refills | Status: DC | PRN

## 2017-05-20 MED ORDER — CEPHALEXIN 500 MG PO CAPS: 500.0000 mg | ORAL_CAPSULE | Freq: Three times a day (TID) | ORAL | 0 refills | Status: DC

## 2017-05-20 NOTE — ED Provider Notes (Signed)
[facility] Emergency Department Provider Note   ____________________________________________   First MD Initiated Contact with Patient 05/20/17 1420     (approximate)  I have reviewed the triage vital signs and the nursing notes.   HISTORY  Chief Complaint Urinary Tract Infection   HPI Andrea Pollard is a 61 y.o. female is here with complaint of nausea and back pain. Patient also states that she had a fever this morning. She was diagnosed with a UTI at an urgent care yesterday and placed on Macrobid and Pyridium. Patient began taking them yesterday. She is also aware that she has a kidney stone. Patient has urinalysis results from urgent care which showed a large amount of leukocytes. She rates her pain as 9 out of 10.   Past Medical History:  Diagnosis Date  . Asthma   . Complication of anesthesia   . Diabetes mellitus without complication ([place])   . GERD (gastroesophageal reflux disease)   . 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 History of methicillin resistant staphylococcus aureus (MRSA) 2013  . Hypertension   . Pneumonia 04/2016  . PONV (postoperative nausea and vomiting)    NAUSEATED  . Sleep apnea    CPAP    Patient Active Problem List   Diagnosis Date Noted  . Pulmonary embolism ([place]) 03/04/2017  . Benign hypertension 10/05/2016  . Constipation 10/05/2016  . Gastroesophageal reflux disease 10/05/2016  . Hypercholesterolemia 10/05/2016  . Indigestion 10/05/2016  . Obstructive sleep apnea of adult 10/05/2016  . Type 2 diabetes mellitus ([place]) 10/05/2016  . Vitamin D deficiency 10/05/2016  . Coronary artery disease of native artery of native heart with stable angina pectoris ([place]) 09/27/2016  . Aortic atherosclerosis ([place]) 09/27/2016  . History of smoking 09/27/2016  . Mass of upper lobe of left lung 06/07/2016  . Morbid obesity ([place]) 04/23/2014    Past Surgical History:  Procedure Laterality Date  . ABDOMINAL HYSTERECTOMY    .  CESAREAN SECTION    . CHOLECYSTECTOMY    . ELECTROMAGNETIC NAVIGATION BROCHOSCOPY N/A 08/15/2016   Procedure: ELECTROMAGNETIC NAVIGATION BRONCHOSCOPY;  Surgeon: Flora Lipps, MD;  Location: [facility] ORS;  Service: Cardiopulmonary;  Laterality: N/A;  . LAPAROSCOPIC GASTRIC SLEEVE RESECTION      Prior to Admission medications   Medication Sig Start Date End Date Taking? Authorizing Provider  acetaminophen (TYLENOL) 500 MG tablet Take 1,000 mg by mouth every 6 (six) hours as needed for mild pain.    [provider]  albuterol (PROAIR HFA) 108 (90 Base) MCG/ACT inhaler Inhale 2 Inhalers into the lungs as needed.    [provider]  albuterol (PROVENTIL HFA;VENTOLIN HFA) 108 (90 Base) MCG/ACT inhaler Inhale 2 puffs into the lungs every 6 (six) hours as needed for wheezing or shortness of breath.     [provider]  aspirin EC 81 MG tablet Take 1 tablet by mouth 1 day or 1 dose.    [provider]  atorvastatin (LIPITOR) 80 MG tablet Take 80 mg by mouth daily. 08/30/16   [provider]  cephALEXin (KEFLEX) 500 MG capsule Take 1 capsule (500 mg total) by mouth 3 (three) times daily. 05/20/17   Johnn Hai, PA-C  citalopram (CELEXA) 10 MG tablet Take 10 mg by mouth daily. 10/11/16   [provider]  FLOVENT HFA 220 MCG/ACT inhaler Inhale 2 puffs into the lungs 2 (two) times daily. 12/11/16   [provider]  fluticasone (FLONASE) 50 MCG/ACT nasal spray Place 2  sprays into both nostrils 1 day or 1 dose.    [provider]  hydrochlorothiazide (HYDRODIURIL) 12.5 MG tablet Take 12.5 mg by mouth daily.    [provider]  HYDROcodone-acetaminophen (NORCO) 5-325 MG tablet Take 1 tablet by mouth every 6 (six) hours as needed for moderate pain. 05/20/17   Johnn Hai, PA-C  insulin glargine (LANTUS) 100 UNIT/ML injection Inject 50 Units into the skin 2 (two) times daily.    [provider]  lansoprazole (PREVACID) 15  MG capsule Take 15 mg by mouth daily at 12 noon.    [provider]  lisinopril (PRINIVIL,ZESTRIL) 10 MG tablet Take 1 tablet by mouth every morning.    [provider]  Melatonin 10 MG TABS Take 10 mg by mouth at bedtime as needed (sleep).    [provider]  metFORMIN (GLUCOPHAGE-XR) 500 MG 24 hr tablet Take 2 tablets by mouth 2 (two) times daily. 03/23/16   [provider]  mometasone-formoterol (DULERA) 100-5 MCG/ACT AERO Inhale 2 puffs into the lungs 2 (two) times daily.    [provider]  Multiple Vitamin (MULTI-VITAMIN DAILY PO) Take 1 mL by mouth 1 day or 1 dose.    [provider]  omeprazole (PRILOSEC) 20 MG capsule Take 20 mg by mouth 1 day or 1 dose.    [provider]  ondansetron (ZOFRAN ODT) 4 MG disintegrating tablet 1-2 every 8 hours prn nausea or vomiting 05/20/17   Madalyn Rob, Rhonda L, PA-C  XARELTO 20 MG TABS tablet TAKE 1 TABLET BY MOUTH ONCE A DAY WITH FOOD 11/29/16   [provider]    Allergies Bactrim [sulfamethoxazole-trimethoprim]; Ibuprofen; Levaquin [levofloxacin in d5w]; and Nitroglycerin  Family History  Problem Relation Age of Onset  . Stroke Father   . Ovarian cancer Maternal Grandmother   . Breast cancer Neg Hx     Social History Social History   Tobacco Use  . Smoking status: Former Smoker    Packs/day: 1.00    Years: 5.00    Pack years: 5.00    Types: Cigarettes    Last attempt to quit: 08/12/1991    Years since quitting: 25.7  . Smokeless tobacco: Never Used  Substance Use Topics  . Alcohol use: No  . Drug use: No    Review of Systems Constitutional: positive fever/chills Eyes: No visual changes. Cardiovascular: Denies chest pain. Respiratory: Denies shortness of breath. Gastrointestinal: No abdominal pain.  positive nausea, positive vomiting.  No diarrhea.  Genitourinary: Negative for dysuria. Musculoskeletal: positive for back pain. Skin: Negative for  rash. Neurological: Negative for headaches, focal weakness or numbness. ____________________________________________   PHYSICAL EXAM:  VITAL SIGNS: ED Triage Vitals [05/20/17 1311]  Enc Vitals Group     BP 135/85     Pulse Rate 66     Resp 16     Temp 98.1 F (36.7 C)     Temp Source Oral     SpO2 98 %     Weight 215 lb (97.5 kg)     Height 5\' 5"  (1.651 m)     Head Circumference      Peak Flow      Pain Score 9     Pain Loc      Pain Edu?      Excl. in [place]?    Constitutional: Alert and oriented. Well appearing and in no acute distress. Eyes: Conjunctivae are normal.  Head: Atraumatic. Nose: No congestion/rhinnorhea. Mouth/Throat: Mucous membranes are moist.  Oropharynx  non-erythematous. Neck: No stridor.   Cardiovascular: Normal rate, regular rhythm. Grossly normal heart sounds.  Good peripheral circulation. Respiratory: Normal respiratory effort.  No retractions. Lungs CTAB. Gastrointestinal: Soft and nontender. No distention.  No CVA tenderness. Musculoskeletal: his upper and lower extremities without any difficulty. Patient was ambulatory without assistance. Neurologic:  Normal speech and language. No gross focal neurologic deficits are appreciated.  Skin:  Skin is warm, dry and intact. No rash noted. Psychiatric: Mood and affect are normal. Speech and behavior are normal.  ____________________________________________   LABS (all labs ordered are listed, but only abnormal results are displayed)  Labs Reviewed  URINALYSIS, COMPLETE (UACMP) WITH MICROSCOPIC - Abnormal; Notable for the following components:      Result Value   Color, Urine ORANGE (*)    Glucose, UA   (*)    Value: TEST NOT REPORTED DUE TO COLOR INTERFERENCE OF URINE PIGMENT   Hgb urine dipstick   (*)    Value: TEST NOT REPORTED DUE TO COLOR INTERFERENCE OF URINE PIGMENT   Bilirubin Urine   (*)    Value: TEST NOT REPORTED DUE TO COLOR INTERFERENCE OF URINE PIGMENT   Ketones, ur   (*)    Value:  TEST NOT REPORTED DUE TO COLOR INTERFERENCE OF URINE PIGMENT   Protein, ur   (*)    Value: TEST NOT REPORTED DUE TO COLOR INTERFERENCE OF URINE PIGMENT   Nitrite   (*)    Value: TEST NOT REPORTED DUE TO COLOR INTERFERENCE OF URINE PIGMENT   Leukocytes, UA   (*)    Value: TEST NOT REPORTED DUE TO COLOR INTERFERENCE OF URINE PIGMENT   Squamous Epithelial / LPF 0-5 (*)    All other components within normal limits    RADIOLOGY  Ct Renal Stone Study  Result Date: 05/20/2017 CLINICAL DATA:  Urinary tract infection diagnosis yesterday. EXAM: CT ABDOMEN AND PELVIS WITHOUT CONTRAST TECHNIQUE: Multidetector CT imaging of the abdomen and pelvis was performed following the standard protocol without IV contrast. COMPARISON:  March 20, 2016, April 16, 2012 FINDINGS: Lower chest: Several small less than 5 mm nodules are identified in bilateral lung bases unchanged compared to prior CT of March 20, 2016 but some nodules are new from prior CT of April 16, 2012. Hepatobiliary: No focal liver abnormality is seen. Status post cholecystectomy. No biliary dilatation. Pancreas: Unremarkable. No pancreatic ductal dilatation or surrounding inflammatory changes. Spleen: No focal splenic lesion. The spleen measures 12.5 cm in length. Adrenals/Urinary Tract: Small low-density nodule is identified in the left adrenal gland unchanged. The right adrenal gland is normal. There is no hydronephrosis bilaterally. A 5 mm nonobstructing stone is identified in the mid to upper pole right kidney. Bladder is partially decompressed without abnormality. Stomach/Bowel: Patient is status post prior gastric sleeve surgery. There is no small bowel obstruction or diverticulitis. The appendix is normal. Vascular/Lymphatic: Aortic atherosclerosis. No enlarged abdominal or pelvic lymph nodes. Reproductive: Status post hysterectomy. No adnexal masses. Other: No abdominal wall hernia or abnormality. No abdominopelvic ascites.  Musculoskeletal: Degenerative joint changes of the spine are identified. IMPRESSION: Right nephrolithiasis.  No hydronephrosis is identified bilaterally. No acute abnormality identified in the abdomen and pelvis. Several small less than 5 mm nodules throughout the visualized bilateral lung bases. The nodules are not changed compared to prior CT of March 20, 2016 but some nodules are new compared prior CT of November 2013. Consider further evaluation with chest CT on outpatient basis. Electronically Signed   By: Abelardo Diesel  M.D.   On: 05/20/2017 15:57    ____________________________________________   PROCEDURES  Procedure(s) performed: None  Procedures  Critical Care performed: No  ____________________________________________   INITIAL IMPRESSION / ASSESSMENT AND PLAN / ED COURSE ----------------------------------------- 6:42 PM on 05/20/2017 ----------------------------------------- Patient states she is feeling much better, no nausea, no pain. Rocephin and fluids have been completed.  Patient was instructed to discontinue taking Macrobid. She is given a prescription for Keflex 500 mg 3 times a day for 10 days, Zofran 4 mg ODT one or 2 every 8 hours as needed for nausea or vomiting. Norco one tablet every 6 hours if needed for pain #6 tablets.Patient is to increase fluids. She is aware that she may return to the emergency department if any severe worsening of her symptoms.  Patient was afebrile during her visit. She was discharged in stable condition.  ____________________________________________   FINAL CLINICAL IMPRESSION(S) / ED DIAGNOSES  Final diagnoses:  Acute UTI (urinary tract infection)     ED Discharge Orders        Ordered    cephALEXin (KEFLEX) 500 MG capsule  3 times daily     05/20/17 1844    ondansetron (ZOFRAN ODT) 4 MG disintegrating tablet     05/20/17 1844    HYDROcodone-acetaminophen (NORCO) 5-325 MG tablet  Every 6 hours PRN     05/20/17 1844        Note:  This document was prepared using Dragon voice recognition software and may include unintentional dictation errors.    Johnn Hai, PA-C 05/20/17 1854    Merlyn Lot, MD 05/21/17 [phone]

## 2017-05-20 NOTE — Discharge Instructions (Signed)
Follow-up with your primary care provider if any continued problems. Discontinue taking Macrobid today. Begin Keflex 500 mg 3 times a day for 10 days. Zofran 1 or 2 tablets on your tongue as needed for nausea every 8 hours. Norco one tablet every 6 hours if needed for pain. Increase fluids. Return to the emergency department if any severe worsening of your symptoms such as vomiting or high fever.

## 2017-05-20 NOTE — ED Triage Notes (Signed)
Patient reports being diagnosed with UTI at urgent care yesterday. Patient reports worsening back pain this week as well as dysuria. Patient prescribed antibiotics, pyridium and tramadol yesterday with no relief of symptoms. States fever at home this morning.

## 2017-05-22 DIAGNOSIS — R911 Solitary pulmonary nodule: Secondary | ICD-10-CM

## 2017-05-22 DIAGNOSIS — Z87442 Personal history of urinary calculi: Secondary | ICD-10-CM

## 2017-05-22 DIAGNOSIS — Z87898 Personal history of other specified conditions: Secondary | ICD-10-CM

## 2017-05-22 HISTORY — DX: Solitary pulmonary nodule: R91.1

## 2017-05-22 HISTORY — DX: Personal history of urinary calculi: Z87.442

## 2017-05-22 HISTORY — DX: Personal history of other specified conditions: Z87.898

## 2017-05-29 ENCOUNTER — Emergency Department
Admission: EM | Admit: 2017-05-29 | Discharge: 2017-05-29 | Disposition: A | Discharge status: Home / Self Care | Payer: 59 | Attending: Student in an Organized Health Care Education/Training Program | Admitting: Student in an Organized Health Care Education/Training Program

## 2017-05-29 ENCOUNTER — Emergency Department: Payer: 59

## 2017-05-29 ENCOUNTER — Other Ambulatory Visit: Payer: Self-pay

## 2017-05-29 ENCOUNTER — Encounter: Payer: Self-pay | Admitting: Emergency Medicine

## 2017-05-29 ENCOUNTER — Ambulatory Visit (INDEPENDENT_AMBULATORY_CARE_PROVIDER_SITE_OTHER): Payer: 59 | Admitting: Internal Medicine

## 2017-05-29 ENCOUNTER — Encounter: Payer: Self-pay | Admitting: Internal Medicine

## 2017-05-29 VITALS — BP 130/86 | HR 86 | Ht 65.0 in | Wt 218.0 lb

## 2017-05-29 DIAGNOSIS — Z87891 Personal history of nicotine dependence: Secondary | ICD-10-CM | POA: Diagnosis not present

## 2017-05-29 DIAGNOSIS — Z7984 Long term (current) use of oral hypoglycemic drugs: Secondary | ICD-10-CM | POA: Diagnosis not present

## 2017-05-29 DIAGNOSIS — I10 Essential (primary) hypertension: Secondary | ICD-10-CM | POA: Insufficient documentation

## 2017-05-29 DIAGNOSIS — J45909 Unspecified asthma, uncomplicated: Secondary | ICD-10-CM | POA: Diagnosis not present

## 2017-05-29 DIAGNOSIS — R310 Gross hematuria: Secondary | ICD-10-CM | POA: Diagnosis not present

## 2017-05-29 DIAGNOSIS — R109 Unspecified abdominal pain: Secondary | ICD-10-CM | POA: Insufficient documentation

## 2017-05-29 DIAGNOSIS — I25118 Atherosclerotic heart disease of native coronary artery with other forms of angina pectoris: Secondary | ICD-10-CM | POA: Diagnosis not present

## 2017-05-29 DIAGNOSIS — E119 Type 2 diabetes mellitus without complications: Secondary | ICD-10-CM | POA: Insufficient documentation

## 2017-05-29 DIAGNOSIS — Z79899 Other long term (current) drug therapy: Secondary | ICD-10-CM | POA: Diagnosis not present

## 2017-05-29 DIAGNOSIS — R918 Other nonspecific abnormal finding of lung field: Secondary | ICD-10-CM

## 2017-05-29 LAB — URINALYSIS, COMPLETE (UACMP) WITH MICROSCOPIC
BILIRUBIN URINE: NEGATIVE
Bacteria, UA: NONE SEEN
Glucose, UA: NEGATIVE mg/dL
Ketones, ur: NEGATIVE mg/dL
Leukocytes, UA: NEGATIVE
Nitrite: NEGATIVE
PH: 5 (ref 5.0–8.0)
Protein, ur: NEGATIVE mg/dL
SPECIFIC GRAVITY, URINE: 1.025 (ref 1.005–1.030)

## 2017-05-29 LAB — BASIC METABOLIC PANEL
ANION GAP: 8 (ref 5–15)
BUN: 22 mg/dL — AB (ref 6–20)
CHLORIDE: 103 mmol/L (ref 101–111)
CO2: 29 mmol/L (ref 22–32)
Calcium: 9.3 mg/dL (ref 8.9–10.3)
Creatinine, Ser: 0.68 mg/dL (ref 0.44–1.00)
GFR calc Af Amer: >60 mL/min (ref >60)
GLUCOSE: 190 mg/dL — AB (ref 65–99)
POTASSIUM: 3.5 mmol/L (ref 3.5–5.1)
SODIUM: 140 mmol/L (ref 135–145)

## 2017-05-29 LAB — CBC WITH DIFFERENTIAL/PLATELET
BASOS ABS: 0.1 10*3/uL (ref 0–0.1)
Basophils Relative: 1 %
EOS PCT: 4 %
Eosinophils Absolute: 0.3 10*3/uL (ref 0–0.7)
HCT: 40.8 % (ref 35.0–47.0)
HEMOGLOBIN: 13.9 g/dL (ref 12.0–16.0)
LYMPHS ABS: 2.2 10*3/uL (ref 1.0–3.6)
LYMPHS PCT: 25 %
MCH: 28.1 pg (ref 26.0–34.0)
MCHC: 34 g/dL (ref 32.0–36.0)
MCV: 82.8 fL (ref 80.0–100.0)
Monocytes Absolute: 0.5 10*3/uL (ref 0.2–0.9)
Monocytes Relative: 6 %
NEUTROS ABS: 5.6 10*3/uL (ref 1.4–6.5)
NEUTROS PCT: 64 %
PLATELETS: 219 10*3/uL (ref 150–440)
RBC: 4.93 MIL/uL (ref 3.80–5.20)
RDW: 14 % (ref 11.5–14.5)
WBC: 8.7 10*3/uL (ref 3.6–11.0)

## 2017-05-29 MED ORDER — KETOROLAC TROMETHAMINE 30 MG/ML IJ SOLN
15.0000 mg | Freq: Once | INTRAMUSCULAR | Status: AC
  Administered 2017-05-29: 15 mg via INTRAVENOUS
  Filled 2017-05-29: qty 1

## 2017-05-29 MED ORDER — HYDROCODONE-ACETAMINOPHEN 5-325 MG PO TABS: 1.0000 | ORAL_TABLET | ORAL | 0 refills | Status: DC | PRN

## 2017-05-29 MED ORDER — HYDROCODONE-ACETAMINOPHEN 5-325 MG PO TABS: 1.0000 | ORAL_TABLET | Freq: Four times a day (QID) | ORAL | 0 refills | Status: DC | PRN

## 2017-05-29 NOTE — Patient Instructions (Signed)
CT chest in FEB 2019

## 2017-05-29 NOTE — Progress Notes (Signed)
[facility] Consultation      Date: 05/29/2017,   MRN# [id number] GOLDIE TREGONING [birth year] Code Status:  Code Status History    This patient does not have a recorded code status. Please follow your organizational policy for patients in this situation.         Admission                  Current  MERIKAY LESNIEWSKI is a 62 y.o. old female seen in consultation for RUL nodule at the request of Dr. Oleta Mouse.     CHIEF COMPLAINT:   Follow up lung nodules   HISTORY OF PRESENT ILLNESS  62 yo white female seen today for follow up abnormal CT chest/PET scan patient has been dx with ASTHMA in her 30's She has been on albuterol as needed It has been very  well controlled over the past 10 years Former smoker 25 years ago 1 ppd for 10 years    COPD noted on PFT's Ratio 71% predicted FEV1 is 1.73 L 68% predicted Positive bronchodilator response of FEV1 FEF 25/75 is 1.1 L which is 40% predicted  Radiographic history January 2018 CT chest shows right upper lobe nodule 2 x 1.3 cm in size February 2018 CT chest shows right upper lobe nodule 1.8 x 1.2 cm in size May 2018 CT chest shows right upperlobe nodule 1.3 x 1.0 cm in size July CT chest 2018 RUL  Lobe nodule 1.3x1.0 unchanged from previous CT chest +b/l PE was noted  August CT chest 2018 RUL lung nodule 1.3x1.0 cm unchanged from previous scan NOV 2018 CT chest  RUL lung nodule 1.3x1.0 cm unchanged from previous scan   Patient had undergone ENB which was nondiagnostic in Jan 2017 for the RUL nodule  In July 2018, patient dx with PE and DVT placed on Xaralto    She has no acute issues at this time Resp status back to normal Occasional SOB and wheezing with cough No signs of infection at this time No signs of acute heart failure at this time    Current Outpatient Medications:  .  acetaminophen (TYLENOL) 500 MG tablet, Take 1,000 mg by mouth every 6 (six) hours as needed for mild pain., Disp: , Rfl:    .  albuterol (PROAIR HFA) 108 (90 Base) MCG/ACT inhaler, Inhale 2 Inhalers into the lungs as needed., Disp: , Rfl:  .  albuterol (PROVENTIL HFA;VENTOLIN HFA) 108 (90 Base) MCG/ACT inhaler, Inhale 2 puffs into the lungs every 6 (six) hours as needed for wheezing or shortness of breath. , Disp: , Rfl:  .  aspirin EC 81 MG tablet, Take 1 tablet by mouth 1 day or 1 dose., Disp: , Rfl:  .  atorvastatin (LIPITOR) 80 MG tablet, Take 80 mg by mouth daily., Disp: , Rfl: 2 .  cephALEXin (KEFLEX) 500 MG capsule, Take 1 capsule (500 mg total) by mouth 3 (three) times daily., Disp: 30 capsule, Rfl: 0 .  citalopram (CELEXA) 10 MG tablet, Take 10 mg by mouth daily., Disp: , Rfl: 2 .  FLOVENT HFA 220 MCG/ACT inhaler, Inhale 2 puffs into the lungs 2 (two) times daily., Disp: , Rfl: 5 .  fluticasone (FLONASE) 50 MCG/ACT nasal spray, Place 2 sprays into both nostrils 1 day or 1 dose., Disp: , Rfl:  .  hydrochlorothiazide (HYDRODIURIL) 12.5 MG tablet, Take 12.5 mg by mouth daily., Disp: , Rfl:  .  HYDROcodone-acetaminophen (NORCO) 5-325 MG tablet, Take 1 tablet by mouth every  6 (six) hours as needed for moderate pain., Disp: 6 tablet, Rfl: 0 .  insulin glargine (LANTUS) 100 UNIT/ML injection, Inject 50 Units into the skin 2 (two) times daily., Disp: , Rfl:  .  lansoprazole (PREVACID) 15 MG capsule, Take 15 mg by mouth daily at 12 noon., Disp: , Rfl:  .  lisinopril (PRINIVIL,ZESTRIL) 10 MG tablet, Take 1 tablet by mouth every morning., Disp: , Rfl:  .  Melatonin 10 MG TABS, Take 10 mg by mouth at bedtime as needed (sleep)., Disp: , Rfl:  .  metFORMIN (GLUCOPHAGE-XR) 500 MG 24 hr tablet, Take 2 tablets by mouth 2 (two) times daily., Disp: , Rfl: 2 .  mometasone-formoterol (DULERA) 100-5 MCG/ACT AERO, Inhale 2 puffs into the lungs 2 (two) times daily., Disp: , Rfl:  .  Multiple Vitamin (MULTI-VITAMIN DAILY PO), Take 1 mL by mouth 1 day or 1 dose., Disp: , Rfl:  .  omeprazole (PRILOSEC) 20 MG capsule, Take 20 mg by mouth  1 day or 1 dose., Disp: , Rfl:  .  ondansetron (ZOFRAN ODT) 4 MG disintegrating tablet, 1-2 every 8 hours prn nausea or vomiting, Disp: 20 tablet, Rfl: 0 .  XARELTO 20 MG TABS tablet, TAKE 1 TABLET BY MOUTH ONCE A DAY WITH FOOD, Disp: , Rfl: 0    ALLERGIES   Bactrim [sulfamethoxazole-trimethoprim]; Ibuprofen; Levaquin [levofloxacin in d5w]; and Nitroglycerin     REVIEW OF SYSTEMS   Review of Systems  Constitutional: Negative for chills, diaphoresis, fever, malaise/fatigue and weight loss.  HENT: Negative for congestion and hearing loss.   Eyes: Negative for blurred vision and double vision.  Respiratory: Negative for cough, hemoptysis, sputum production, shortness of breath and wheezing.   Cardiovascular: Negative for chest pain, palpitations and orthopnea.  Gastrointestinal: Negative for abdominal pain, heartburn, nausea and vomiting.  Genitourinary: Negative for dysuria and urgency.  Musculoskeletal: Negative for myalgias and neck pain.  Skin: Negative for rash.  Neurological: Negative for dizziness, tingling, tremors, weakness and headaches.  Endo/Heme/Allergies: Does not bruise/bleed easily.  Psychiatric/Behavioral: Negative for depression, substance abuse and suicidal ideas.  All other systems reviewed and are negative.   BP 130/86 (BP Location: Left Arm, Cuff Size: Normal)   Pulse 86   Ht 5\' 5"  (1.651 m)   Wt 218 lb (98.9 kg)   SpO2 98%   BMI 36.28 kg/m     PHYSICAL EXAM  Physical Exam  Constitutional: She is oriented to person, place, and time. She appears well-developed and well-nourished. No distress.  HENT:  Head: Normocephalic and atraumatic.  Mouth/Throat: No oropharyngeal exudate.  Eyes: EOM are normal. Pupils are equal, round, and reactive to light. No scleral icterus.  Neck: Normal range of motion. Neck supple.  Cardiovascular: Normal rate, regular rhythm and normal heart sounds.  No murmur heard. Pulmonary/Chest: No stridor. No respiratory distress.  She has no wheezes.  Abdominal: Soft. Bowel sounds are normal.  Musculoskeletal: Normal range of motion. She exhibits no edema.  Neurological: She is alert and oriented to person, place, and time. No cranial nerve deficit.  Skin: Skin is warm. She is not diaphoretic.  Psychiatric: She has a normal mood and affect.         scan   ASSESSMENT/PLAN  62 yo pleasant white female with  Moderate COPD Gold stage A which seems to be controlled with her current regimen of Qvar, in the setting of obesity and deconditioned state with an abnormal finding on CT scan with a right upper lobe nodular opacity status  post ENB which was nondiagnostic   #1 shortness of breath likely from her obesity and her deconditioned state  #2 moderate COPD based on pulmonary function testing with FEV1 of 68% Continue Flovent as prescribed Albuterol as needed Avoid secondhand smoke  #3 right upper lobe nodular opacity After further assessment and review of CT scans with the patient, the size of the right upper lobe nodule has unchanged over 1 year Follow up CT chest scheduled for FEB 2019  #4 Obesity -recommend significant weight loss -recommend changing diet  #5 Deconditioned state -Recommend increased daily activity and exercise   #6 right middle lobe nodular opacity and left upper lobe nodular opacity will need to further evaluate for interval changes with a repeat CT chest in approximately 3 months   Patient satisfied with Plan of action and management. All questions answered Follow-up in 3  months   Mikah Poss Patricia Pesa, M.D.  [facility]  Medical Director [place] Director [facility] Cardio-Pulmonary Department

## 2017-05-29 NOTE — Discharge Instructions (Signed)

## 2017-05-29 NOTE — ED Provider Notes (Signed)
[facility] Emergency Department Provider Note    First MD Initiated Contact with Patient 05/29/17 1225     (approximate)  I have reviewed the triage vital signs and the nursing notes.   HISTORY  Chief Complaint Flank Pain    HPI Andrea Pollard is a 61 y.o. female who was recently started on antibiotics for UTI and diagnosed with right and left lower lithiasis but no recent hospitalizations presents to the ER with hematuria today and worsening right flank pain.  Denies any fevers.  Denies any chills.  States the pain is moderate to severe and comes in waves.  Has never had kidney stones in the past.  States that she felt that the symptoms from the UTI were improving and has been compliant with her Keflex that she was discharged on.  Denies any other symptoms at this time.  Past Medical History:  Diagnosis Date  . Asthma   . Complication of anesthesia   . Diabetes mellitus without complication ([place])   . GERD (gastroesophageal reflux disease)   . 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 History of methicillin resistant staphylococcus aureus (MRSA) 2013  . Hypertension   . Pneumonia 04/2016  . PONV (postoperative nausea and vomiting)    NAUSEATED  . Sleep apnea    CPAP   Family History  Problem Relation Age of Onset  . Stroke Father   . Ovarian cancer Maternal Grandmother   . Breast cancer Neg Hx    Past Surgical History:  Procedure Laterality Date  . ABDOMINAL HYSTERECTOMY    . CESAREAN SECTION    . CHOLECYSTECTOMY    . ELECTROMAGNETIC NAVIGATION BROCHOSCOPY N/A 08/15/2016   Procedure: ELECTROMAGNETIC NAVIGATION BRONCHOSCOPY;  Surgeon: Flora Lipps, MD;  Location: [facility] ORS;  Service: Cardiopulmonary;  Laterality: N/A;  . LAPAROSCOPIC GASTRIC SLEEVE RESECTION     Patient Active Problem List   Diagnosis Date Noted  . Pulmonary embolism ([place]) 03/04/2017  . Benign hypertension 10/05/2016  . Constipation 10/05/2016  . Gastroesophageal reflux  disease 10/05/2016  . Hypercholesterolemia 10/05/2016  . Indigestion 10/05/2016  . Obstructive sleep apnea of adult 10/05/2016  . Type 2 diabetes mellitus ([place]) 10/05/2016  . Vitamin D deficiency 10/05/2016  . Coronary artery disease of native artery of native heart with stable angina pectoris ([place]) 09/27/2016  . Aortic atherosclerosis ([place]) 09/27/2016  . History of smoking 09/27/2016  . Mass of upper lobe of left lung 06/07/2016  . Morbid obesity ([place]) 04/23/2014      Prior to Admission medications   Medication Sig Start Date End Date Taking? Authorizing Provider  acetaminophen (TYLENOL) 500 MG tablet Take 1,000 mg by mouth every 6 (six) hours as needed for mild pain.    [provider]  albuterol (PROAIR HFA) 108 (90 Base) MCG/ACT inhaler Inhale 2 Inhalers into the lungs as needed.    [provider]  atorvastatin (LIPITOR) 80 MG tablet Take 80 mg by mouth daily. 08/30/16   [provider]  cephALEXin (KEFLEX) 500 MG capsule Take 1 capsule (500 mg total) by mouth 3 (three) times daily. 05/20/17   Johnn Hai, PA-C  citalopram (CELEXA) 10 MG tablet Take 10 mg by mouth daily. 10/11/16   [provider]  FLOVENT HFA 220 MCG/ACT inhaler Inhale 2 puffs into the lungs 2 (two) times daily. 12/11/16   [provider]  fluticasone (FLONASE) 50 MCG/ACT nasal spray Place 2 sprays into both nostrils 1 day or 1 dose.  [provider]  hydrochlorothiazide (HYDRODIURIL) 12.5 MG tablet Take 12.5 mg by mouth daily.    [provider]  HYDROcodone-acetaminophen (NORCO) 5-325 MG tablet Take 1 tablet by mouth every 6 (six) hours as needed for moderate pain. 05/20/17   Johnn Hai, PA-C  lansoprazole (PREVACID) 15 MG capsule Take 15 mg by mouth daily at 12 noon.    [provider]  metFORMIN (GLUCOPHAGE-XR) 500 MG 24 hr tablet Take 2 tablets by mouth 2 (two) times daily. 03/23/16   [provider]  Multiple Vitamin  (MULTI-VITAMIN DAILY PO) Take 1 mL by mouth 1 day or 1 dose.    [provider]  omeprazole (PRILOSEC) 20 MG capsule Take 20 mg by mouth 1 day or 1 dose.    [provider]  XARELTO 20 MG TABS tablet TAKE 1 TABLET BY MOUTH ONCE A DAY WITH FOOD 11/29/16   [provider]    Allergies Ibuprofen; Levaquin [levofloxacin in d5w]; Sulfamethoxazole-trimethoprim; and Nitroglycerin    Social History Social History   Tobacco Use  . Smoking status: Former Smoker    Packs/day: 1.00    Years: 5.00    Pack years: 5.00    Types: Cigarettes    Last attempt to quit: 08/12/1991    Years since quitting: 25.8  . Smokeless tobacco: Never Used  Substance Use Topics  . Alcohol use: No  . Drug use: No    Review of Systems Patient denies headaches, rhinorrhea, blurry vision, numbness, shortness of breath, chest pain, edema, cough, abdominal pain, nausea, vomiting, diarrhea, dysuria, fevers, rashes or hallucinations unless otherwise stated above in HPI. ____________________________________________   PHYSICAL EXAM:  VITAL SIGNS: Vitals:   05/29/17 1201  BP: 121/68  Pulse: 80  Resp: 18  Temp: 98.1 F (36.7 C)  SpO2: 98%    Constitutional: Alert and oriented. Well appearing and in no acute distress. Eyes: Conjunctivae are normal.  Head: Atraumatic. Nose: No congestion/rhinnorhea. Mouth/Throat: Mucous membranes are moist.   Neck: No stridor. Painless ROM.  Cardiovascular: Normal rate, regular rhythm. Grossly normal heart sounds.  Good peripheral circulation. Respiratory: Normal respiratory effort.  No retractions. Lungs CTAB. Gastrointestinal: Soft and nontender. No distention. No abdominal bruits. right CVA tenderness. Genitourinary:  Musculoskeletal: No lower extremity tenderness nor edema.  No joint effusions. Neurologic:  Normal speech and language. No gross focal neurologic deficits are appreciated. No facial droop Skin:  Skin is warm, dry and intact. No rash  noted. Psychiatric: Mood and affect are normal. Speech and behavior are normal.  ____________________________________________   LABS (all labs ordered are listed, but only abnormal results are displayed)  Results for orders placed or performed during the hospital encounter of 05/29/17 (from the past 24 hour(s))  Urinalysis, Complete w Microscopic     Status: Abnormal   Collection Time: 05/29/17 12:07 PM  Result Value Ref Range   Color, Urine YELLOW (A) YELLOW   APPearance HAZY (A) CLEAR   Specific Gravity, Urine 1.025 1.005 - 1.030   pH 5.0 5.0 - 8.0   Glucose, UA NEGATIVE NEGATIVE mg/dL   Hgb urine dipstick MODERATE (A) NEGATIVE   Bilirubin Urine NEGATIVE NEGATIVE   Ketones, ur NEGATIVE NEGATIVE mg/dL   Protein, ur NEGATIVE NEGATIVE mg/dL   Nitrite NEGATIVE NEGATIVE   Leukocytes, UA NEGATIVE NEGATIVE   RBC / HPF 6-30 0 - 5 RBC/hpf   WBC, UA 0-5 0 - 5 WBC/hpf   Bacteria, UA NONE SEEN NONE SEEN   Squamous Epithelial / LPF 0-5 (  A) NONE SEEN   Mucus PRESENT    Ca Oxalate Crys, UA PRESENT   CBC with Differential/Platelet     Status: None   Collection Time: 05/29/17 12:38 PM  Result Value Ref Range   WBC 8.7 3.6 - 11.0 K/uL   RBC 4.93 3.80 - 5.20 MIL/uL   Hemoglobin 13.9 12.0 - 16.0 g/dL   HCT 40.8 35.0 - 47.0 %   MCV 82.8 80.0 - 100.0 fL   MCH 28.1 26.0 - 34.0 pg   MCHC 34.0 32.0 - 36.0 g/dL   RDW 14.0 11.5 - 14.5 %   Platelets 219 150 - 440 K/uL   Neutrophils Relative % 64 %   Neutro Abs 5.6 1.4 - 6.5 K/uL   Lymphocytes Relative 25 %   Lymphs Abs 2.2 1.0 - 3.6 K/uL   Monocytes Relative 6 %   Monocytes Absolute 0.5 0.2 - 0.9 K/uL   Eosinophils Relative 4 %   Eosinophils Absolute 0.3 0 - 0.7 K/uL   Basophils Relative 1 %   Basophils Absolute 0.1 0 - 0.1 K/uL  Basic metabolic panel     Status: Abnormal   Collection Time: 05/29/17 12:38 PM  Result Value Ref Range   Sodium 140 135 - 145 mmol/L   Potassium 3.5 3.5 - 5.1 mmol/L   Chloride 103 101 - 111 mmol/L   CO2 29  22 - 32 mmol/L   Glucose, Bld 190 (H) 65 - 99 mg/dL   BUN 22 (H) 6 - 20 mg/dL   Creatinine, Ser 0.68 0.44 - 1.00 mg/dL   Calcium 9.3 8.9 - 10.3 mg/dL   GFR calc non Af Amer >60 >60 mL/min   GFR calc Af Amer >60 >60 mL/min   Anion gap 8 5 - 15   ____________________________________________ ____________________________________________  RADIOLOGY  I personally reviewed all radiographic images ordered to evaluate for the above acute complaints and reviewed radiology reports and findings.  These findings were personally discussed with the patient.  Please see medical record for radiology report.  ____________________________________________   PROCEDURES  Procedure(s) performed:  Procedures    Critical Care performed: no ____________________________________________   INITIAL IMPRESSION / ASSESSMENT AND PLAN / ED COURSE  Pertinent labs & imaging results that were available during my care of the patient were reviewed by me and considered in my medical decision making (see chart for details).  DDX: stone, pyelo, renal colic, cystitis  KAILE BIXLER is a 62 y.o. who presents to the ED with right flank pain as described above.  Blood work and ultrasound ordered for the above differential.  There is evidence of nephrolithiasis but no hydronephrosis.  Patient does have trace blood on her urine but no evidence of infection.  She is afebrile without any leukocytosis or acidosis.  Her abdominal exam is soft and benign.  Patient has had CT imaging done last week showing normal aortic diameter.  Do not feel based on normal blood work, normal vitals and recent CT imaging that repeat emergent CT imaging is clinically indicated at this time.  Patient able to tolerate oral hydration.  Stable follow-up with urology as an outpatient.      ____________________________________________   FINAL CLINICAL IMPRESSION(S) / ED DIAGNOSES  Final diagnoses:  Right flank pain  Gross hematuria       NEW MEDICATIONS STARTED DURING THIS VISIT:  This SmartLink is deprecated. Use AVSMEDLIST instead to display the medication list for a patient.   Note:  This document was prepared using Dragon  voice recognition software and may include unintentional dictation errors.    Merlyn Lot, MD 05/29/17 1328

## 2017-05-29 NOTE — ED Notes (Signed)
FIRST NURSE NOTE: Pt ambulatory to triage, in NAD.

## 2017-05-29 NOTE — ED Triage Notes (Signed)
Pt to ed with c/o repeat UTI for several weeks.  Pt states started new abx 1 week ago, reports today bleeding with urination.

## 2017-05-29 NOTE — ED Notes (Signed)
NAD noted at time of D/C. Pt denies questions or concerns. Pt ambulatory to the lobby at this time.  

## 2017-05-31 LAB — URINE CULTURE

## 2017-06-02 NOTE — Progress Notes (Signed)
ED CULTURE REPORT   Andrea Pollard is a 62 yo female seen in the ED on 05/29/2017 with c/o flank pain. Patient has a history of recent UTI's and has recently received augmentin, nitrofurantoin, and cephalexin. Upon arrival, further work up revealed that the patient had nephrolithiasis and a urine culture was obtained. The patient was discharged with no antibiotics and MD instructed to follow up with urologist. Urine cultures resulted on 06/01/2017 showing 30,000 colonies of pseudomonas aeruginosa sensitive to ciprofloxacin, however, the chart shows a levofloxacin allergy with hives as the reaction. The UA from her visit did not show bacteria, was nitrite negative, and was negative for leukocytes. I discussed the case with ED MD Dr. Quentin Cornwall who felt that the pseudomonas was likely a colonization and did not require further treatment other than follow up with urologist and treatment for nephrolithiasis. He requested that I call patient and follow up to determine if she was still having UTI symptoms. I attempted to call the patient 3 times but was unable to reach them.   Results for orders placed or performed during the hospital encounter of 05/29/17  Urine culture     Status: Abnormal   Collection Time: 05/29/17 12:07 PM  Result Value Ref Range Status   Specimen Description   Final    URINE, RANDOM Performed at [facility], [address] [place], [place] [address]    Special Requests   Final    NONE Performed at [facility], [place], [place] [address]    Culture 30,000 COLONIES/mL PSEUDOMONAS AERUGINOSA (A)  Final   Report Status 05/31/2017 FINAL  Final   Organism ID, Bacteria PSEUDOMONAS AERUGINOSA (A)  Final      Susceptibility   Pseudomonas aeruginosa - MIC*    CEFTAZIDIME 4 SENSITIVE Sensitive     CIPROFLOXACIN <=0.25 SENSITIVE Sensitive     GENTAMICIN <=1 SENSITIVE Sensitive     IMIPENEM 2 SENSITIVE Sensitive     PIP/TAZO 8 SENSITIVE Sensitive    CEFEPIME 2 SENSITIVE Sensitive     * 30,000 COLONIES/mL PSEUDOMONAS AERUGINOSA    Lendon Ka, PharmD Pharmacy Resident

## 2017-06-03 NOTE — Progress Notes (Deleted)
[place]  [phone] [phone]  ID: Andrea Pollard OB: 1955/07/14  MR#: [id number]  YQM#:[id number]  Patient Care Team: Ellamae Sia, MD as PCP - General (Internal Medicine) Minna Merritts, MD as Consulting Physician (Cardiology) Telford Nab, RN as Registered Nurse  CHIEF COMPLAINT: Mass of upper lobe of right lung, DVT/PE.  INTERVAL HISTORY: Patient returns to clinic today or further evaluation and discussion of her laboratory results. She currently feels well and is asymptomatic. She is tolerating Xarelto well without significant side effects. She does not complain of shortness of breath today. She has no neurologic complaints. She denies any recent fevers. She has a good appetite and denies weight loss. She has no cough, chest pain, or hemoptysis. She denies any nausea, vomiting, constipation, or diarrhea. She has no urinary complaints. Patient offers no specific complaints today.  REVIEW OF SYSTEMS:   Review of Systems  Constitutional: Negative.  Negative for fever, malaise/fatigue and weight loss.  Respiratory: Negative for cough, hemoptysis and shortness of breath.   Cardiovascular: Negative.  Negative for chest pain and leg swelling.  Gastrointestinal: Negative.  Negative for abdominal pain.  Genitourinary: Negative.   Musculoskeletal: Negative.   Neurological: Negative.  Negative for sensory change and weakness.  Psychiatric/Behavioral: Negative.  The patient is not nervous/anxious and does not have insomnia.     As per HPI. Otherwise, a complete review of systems is negative.  PAST MEDICAL HISTORY: Past Medical History:  Diagnosis Date  . Asthma   . Complication of anesthesia   . Diabetes mellitus without complication ([place])   . GERD (gastroesophageal reflux disease)   . Hepatitis    AGE 29-HEPATITIS B  . History of methicillin resistant staphylococcus aureus (MRSA) 2013  . Hypertension   . Pneumonia 04/2016  .  PONV (postoperative nausea and vomiting)    NAUSEATED  . Sleep apnea    CPAP    PAST SURGICAL HISTORY: Past Surgical History:  Procedure Laterality Date  . ABDOMINAL HYSTERECTOMY    . CESAREAN SECTION    . CHOLECYSTECTOMY    . ELECTROMAGNETIC NAVIGATION BROCHOSCOPY N/A 08/15/2016   Procedure: ELECTROMAGNETIC NAVIGATION BRONCHOSCOPY;  Surgeon: Flora Lipps, MD;  Location: [facility] ORS;  Service: Cardiopulmonary;  Laterality: N/A;  . LAPAROSCOPIC GASTRIC SLEEVE RESECTION      FAMILY HISTORY: Family History  Problem Relation Age of Onset  . Stroke Father   . Ovarian cancer Maternal Grandmother   . Breast cancer Neg Hx     ADVANCED DIRECTIVES (Y/N):  N  HEALTH MAINTENANCE: Social History   Tobacco Use  . Smoking status: Former Smoker    Packs/day: 1.00    Years: 5.00    Pack years: 5.00    Types: Cigarettes    Last attempt to quit: 08/12/1991    Years since quitting: 25.8  . Smokeless tobacco: Never Used  Substance Use Topics  . Alcohol use: No  . Drug use: No     Colonoscopy:  PAP:  Bone density:  Lipid panel:  Allergies  Allergen Reactions  . Ibuprofen Other (See Comments)    Cannot Use due to Gastric bypass  . Levaquin [Levofloxacin In D5w] Hives  . Sulfamethoxazole-Trimethoprim Hives  . Nitroglycerin Other (See Comments)    Hypotension    Current Outpatient Medications  Medication Sig Dispense Refill  . acetaminophen (TYLENOL) 500 MG tablet Take 1,000 mg by mouth every 6 (six) hours as needed for mild pain.    Marland Kitchen albuterol (PROAIR HFA) 108 (90 Base)  MCG/ACT inhaler Inhale 2 Inhalers into the lungs as needed.    Marland Kitchen atorvastatin (LIPITOR) 80 MG tablet Take 80 mg by mouth daily.  2  . cephALEXin (KEFLEX) 500 MG capsule Take 1 capsule (500 mg total) by mouth 3 (three) times daily. 30 capsule 0  . citalopram (CELEXA) 10 MG tablet Take 10 mg by mouth daily.  2  . FLOVENT HFA 220 MCG/ACT inhaler Inhale 2 puffs into the lungs 2 (two) times daily.  5  . fluticasone  (FLONASE) 50 MCG/ACT nasal spray Place 2 sprays into both nostrils 1 day or 1 dose.    . hydrochlorothiazide (HYDRODIURIL) 12.5 MG tablet Take 12.5 mg by mouth daily.    Marland Kitchen HYDROcodone-acetaminophen (NORCO) 5-325 MG tablet Take 1 tablet by mouth every 6 (six) hours as needed for moderate pain. 6 tablet 0  . HYDROcodone-acetaminophen (NORCO) 5-325 MG tablet Take 1 tablet by mouth every 4 (four) hours as needed for moderate pain. 6 tablet 0  . lansoprazole (PREVACID) 15 MG capsule Take 15 mg by mouth daily at 12 noon.    . metFORMIN (GLUCOPHAGE-XR) 500 MG 24 hr tablet Take 2 tablets by mouth 2 (two) times daily.  2  . Multiple Vitamin (MULTI-VITAMIN DAILY PO) Take 1 mL by mouth 1 day or 1 dose.    Marland Kitchen omeprazole (PRILOSEC) 20 MG capsule Take 20 mg by mouth 1 day or 1 dose.    Marland Kitchen XARELTO 20 MG TABS tablet TAKE 1 TABLET BY MOUTH ONCE A DAY WITH FOOD  0   No current facility-administered medications for this visit.     OBJECTIVE: There were no vitals filed for this visit.   There is no height or weight on file to calculate BMI.    ECOG FS:0 - Asymptomatic  General: Well-developed, well-nourished, no acute distress. Eyes: Pink conjunctiva, anicteric sclera. Lungs: Clear to auscultation bilaterally. Heart: Regular rate and rhythm. No rubs, murmurs, or gallops. Abdomen: Soft, nontender, nondistended. No organomegaly noted, normoactive bowel sounds. Musculoskeletal: Mild left leg edema. Neuro: Alert, answering all questions appropriately. Cranial nerves grossly intact. Skin: No rashes or petechiae noted. Psych: Normal affect.   LAB RESULTS:  Lab Results  Component Value Date   NA 140 05/29/2017   K 3.5 05/29/2017   CL 103 05/29/2017   CO2 29 05/29/2017   GLUCOSE 190 (H) 05/29/2017   BUN 22 (H) 05/29/2017   CREATININE 0.68 05/29/2017   CALCIUM 9.3 05/29/2017   PROT 7.8 11/28/2016   ALBUMIN 4.1 11/28/2016   AST 25 11/28/2016   ALT 22 11/28/2016   ALKPHOS 87 11/28/2016   BILITOT 0.7  11/28/2016   GFRNONAA >60 05/29/2017   GFRAA >60 05/29/2017    Lab Results  Component Value Date   WBC 8.7 05/29/2017   NEUTROABS 5.6 05/29/2017   HGB 13.9 05/29/2017   HCT 40.8 05/29/2017   MCV 82.8 05/29/2017   PLT 219 05/29/2017     STUDIES: US Renal  Result Date: 05/29/2017 CLINICAL DATA:  Nephrolithiasis.  Hematuria. EXAM: RENAL / URINARY TRACT ULTRASOUND COMPLETE COMPARISON:  CT 05/20/2017. FINDINGS: Right Kidney: Length: 11.7 cm. Echogenicity within normal limits. No mass or hydronephrosis visualized. 6 mm nonobstructing calculus. Left Kidney: Length: 11.8 cm. Echogenicity within normal limits. No mass or hydronephrosis visualized. Bladder: Bladder not visualized due to recent voiding. IMPRESSION: 1. 6 mm nonobstructing calculus right kidney again noted. No acute abnormality. No hydronephrosis. 2.  Exam otherwise unremarkable. Electronically Signed   By: Marcello Moores  Register   On: 05/29/2017  13:16   Ct Renal Stone Study  Result Date: 05/20/2017 CLINICAL DATA:  Urinary tract infection diagnosis yesterday. EXAM: CT ABDOMEN AND PELVIS WITHOUT CONTRAST TECHNIQUE: Multidetector CT imaging of the abdomen and pelvis was performed following the standard protocol without IV contrast. COMPARISON:  March 20, 2016, April 16, 2012 FINDINGS: Lower chest: Several small less than 5 mm nodules are identified in bilateral lung bases unchanged compared to prior CT of March 20, 2016 but some nodules are new from prior CT of April 16, 2012. Hepatobiliary: No focal liver abnormality is seen. Status post cholecystectomy. No biliary dilatation. Pancreas: Unremarkable. No pancreatic ductal dilatation or surrounding inflammatory changes. Spleen: No focal splenic lesion. The spleen measures 12.5 cm in length. Adrenals/Urinary Tract: Small low-density nodule is identified in the left adrenal gland unchanged. The right adrenal gland is normal. There is no hydronephrosis bilaterally. A 5 mm nonobstructing  stone is identified in the mid to upper pole right kidney. Bladder is partially decompressed without abnormality. Stomach/Bowel: Patient is status post prior gastric sleeve surgery. There is no small bowel obstruction or diverticulitis. The appendix is normal. Vascular/Lymphatic: Aortic atherosclerosis. No enlarged abdominal or pelvic lymph nodes. Reproductive: Status post hysterectomy. No adnexal masses. Other: No abdominal wall hernia or abnormality. No abdominopelvic ascites. Musculoskeletal: Degenerative joint changes of the spine are identified. IMPRESSION: Right nephrolithiasis.  No hydronephrosis is identified bilaterally. No acute abnormality identified in the abdomen and pelvis. Several small less than 5 mm nodules throughout the visualized bilateral lung bases. The nodules are not changed compared to prior CT of March 20, 2016 but some nodules are new compared prior CT of November 2013. Consider further evaluation with chest CT on outpatient basis. Electronically Signed   By: Abelardo Diesel M.D.   On: 05/20/2017 15:57    ASSESSMENT: Mass of upper lobe of right lung, acute PE/left leg DVT.  PLAN:    1. Mass of upper lobe of right lung: CT scan results from January 04, 2017 reviewed independently with essentially unchanged 2 cm spiculated nodule. Previously, patient had a negative biopsy. She has a repeat CT scan scheduled for April 09, 2017. Continue to follow-up with pulmonary and thoracic surgery as scheduled. 2. Acute bilateral PE/left leg DVT: Diagnosed by CT scan on November 28, 2016. Patient has no obvious transient risk factors. Hypercoagulable workup is negative except for an elevated DRVVT which can be attributed to her anticoagulation. No intervention is needed at this time. Patient will require a minimum of 6 months of anticoagulation with Xarelto. Return to clinic in 3 months for further evaluation. 3. Disability: Patient was given a letter today allowing her to return to work as  tolerated.  Approximately 20 minutes was spent in discussion of which greater than 50% was consultation.  Patient expressed understanding and was in agreement with this plan. She also understands that She can call clinic at any time with any questions, concerns, or complaints.   Cancer Staging No matching staging information was found for the patient.  Lloyd Huger, MD   06/03/2017 2:42 PM

## 2017-06-05 ENCOUNTER — Telehealth: Payer: Self-pay | Admitting: *Deleted

## 2017-06-05 NOTE — Telephone Encounter (Signed)
Pt called in to cancel/reschedule appt that is scheduled on 1/16 with Dr. Grayland Ormond. Pt states is sick with a cold and did not feel comfortable coming into the cancer center and risk spreading her cold to other patients. Message sent to scheduling to reschedule appt.

## 2017-06-06 ENCOUNTER — Inpatient Hospital Stay: Payer: 59 | Admitting: Oncology

## 2017-07-16 ENCOUNTER — Telehealth: Payer: Self-pay

## 2017-07-16 NOTE — Telephone Encounter (Signed)
Left message regarding changing appointment from 07-20-17 to 07-23-17.  Patient scheduled to have CT scan 07/19/17.

## 2017-07-19 ENCOUNTER — Ambulatory Visit
Admission: RE | Admit: 2017-07-19 | Discharge: 2017-07-19 | Disposition: A | Discharge status: Home / Self Care | Payer: 59 | Source: Ambulatory Visit | Attending: Cardiothoracic Surgery | Admitting: Cardiothoracic Surgery

## 2017-07-19 DIAGNOSIS — I700 Atherosclerosis of aorta: Secondary | ICD-10-CM | POA: Insufficient documentation

## 2017-07-19 DIAGNOSIS — R911 Solitary pulmonary nodule: Secondary | ICD-10-CM | POA: Insufficient documentation

## 2017-07-20 ENCOUNTER — Ambulatory Visit: Payer: 59 | Admitting: Cardiothoracic Surgery

## 2017-07-23 ENCOUNTER — Emergency Department
Admission: EM | Admit: 2017-07-23 | Discharge: 2017-07-23 | Disposition: A | Discharge status: Home / Self Care | Payer: 59 | Attending: Emergency Medicine | Admitting: Emergency Medicine

## 2017-07-23 ENCOUNTER — Other Ambulatory Visit: Payer: Self-pay

## 2017-07-23 ENCOUNTER — Encounter: Payer: Self-pay | Admitting: Cardiothoracic Surgery

## 2017-07-23 ENCOUNTER — Telehealth: Payer: Self-pay

## 2017-07-23 ENCOUNTER — Telehealth: Payer: Self-pay | Admitting: Cardiothoracic Surgery

## 2017-07-23 ENCOUNTER — Ambulatory Visit (INDEPENDENT_AMBULATORY_CARE_PROVIDER_SITE_OTHER): Payer: 59 | Admitting: Cardiothoracic Surgery

## 2017-07-23 VITALS — BP 146/86 | HR 93 | Temp 97.8°F | Resp 16 | Ht 65.0 in | Wt 223.0 lb

## 2017-07-23 DIAGNOSIS — Z87891 Personal history of nicotine dependence: Secondary | ICD-10-CM | POA: Diagnosis not present

## 2017-07-23 DIAGNOSIS — E119 Type 2 diabetes mellitus without complications: Secondary | ICD-10-CM | POA: Diagnosis not present

## 2017-07-23 DIAGNOSIS — I2510 Atherosclerotic heart disease of native coronary artery without angina pectoris: Secondary | ICD-10-CM | POA: Diagnosis not present

## 2017-07-23 DIAGNOSIS — R918 Other nonspecific abnormal finding of lung field: Secondary | ICD-10-CM | POA: Diagnosis not present

## 2017-07-23 DIAGNOSIS — R911 Solitary pulmonary nodule: Secondary | ICD-10-CM

## 2017-07-23 DIAGNOSIS — F410 Panic disorder [episodic paroxysmal anxiety] without agoraphobia: Secondary | ICD-10-CM | POA: Diagnosis not present

## 2017-07-23 DIAGNOSIS — I10 Essential (primary) hypertension: Secondary | ICD-10-CM | POA: Insufficient documentation

## 2017-07-23 DIAGNOSIS — J45909 Unspecified asthma, uncomplicated: Secondary | ICD-10-CM | POA: Insufficient documentation

## 2017-07-23 DIAGNOSIS — R002 Palpitations: Secondary | ICD-10-CM | POA: Insufficient documentation

## 2017-07-23 LAB — BASIC METABOLIC PANEL
ANION GAP: 10 (ref 5–15)
BUN: 18 mg/dL (ref 6–20)
CHLORIDE: 101 mmol/L (ref 101–111)
CO2: 28 mmol/L (ref 22–32)
Calcium: 8.9 mg/dL (ref 8.9–10.3)
Creatinine, Ser: 0.49 mg/dL (ref 0.44–1.00)
GFR calc Af Amer: >60 mL/min (ref >60)
GFR calc non Af Amer: >60 mL/min (ref >60)
Glucose, Bld: 114 mg/dL — ABNORMAL HIGH (ref 65–99)
POTASSIUM: 3.5 mmol/L (ref 3.5–5.1)
Sodium: 139 mmol/L (ref 135–145)

## 2017-07-23 LAB — CBC
HEMATOCRIT: 40.1 % (ref 35.0–47.0)
HEMOGLOBIN: 13.6 g/dL (ref 12.0–16.0)
MCH: 27.5 pg (ref 26.0–34.0)
MCHC: 33.9 g/dL (ref 32.0–36.0)
MCV: 81.1 fL (ref 80.0–100.0)
Platelets: 227 10*3/uL (ref 150–440)
RBC: 4.94 MIL/uL (ref 3.80–5.20)
RDW: 14.3 % (ref 11.5–14.5)
WBC: 7.6 10*3/uL (ref 3.6–11.0)

## 2017-07-23 LAB — MAGNESIUM: MAGNESIUM: 1.9 mg/dL (ref 1.7–2.4)

## 2017-07-23 LAB — TSH: TSH: 2.052 u[IU]/mL (ref 0.350–4.500)

## 2017-07-23 LAB — TROPONIN I: Troponin I: <0.03 ng/mL (ref <0.03)

## 2017-07-23 NOTE — Discharge Instructions (Signed)
Please keep your appointment for the echocardiogram.  Make a follow-up appointment with your primary care physician, as well as with Dr. Chancy Milroy, the cardiologist, for reevaluation of your symptoms.  Please bring your echocardiogram results with you to Dr. Marella Bile office.  If you are able to get a CD of the images, this will be helpful for Dr. Chancy Milroy.  Please continue to take all your medications as prescribed.  Return to the emergency department if you develop severe pain, lightheadedness or fainting, palpitations, shortness of breath, or any other symptoms concerning to you.

## 2017-07-23 NOTE — Telephone Encounter (Signed)
Patient called said she doesn't have a phone, but said she got dizzy and is waiting in the ER to be seen, but was asking if the echo has been schedule for her.patient said she would call back in 20 minutes.

## 2017-07-23 NOTE — Progress Notes (Signed)
  Patient ID: Andrea Pollard, female   DOB: 1956-05-21, 62 y.o.   MRN: [id number]  HISTORY: She comes in today for routine follow-up.  She did have a CT scan made last week.  As you know she has had multiple bilateral pulmonary nodules that we have been following.  She has no specific complaints today.  She is not short of breath.   Vitals:   07/23/17 1059  BP: (!) 146/86  Pulse: 93  Resp: 16  Temp: 97.8 F (36.6 C)  SpO2: 97%     EXAM:    Resp: Lungs are clear bilaterally.  No respiratory distress, normal effort. Heart: There is a short systolic murmur.  In addition there are multiple PVCs present. Abd:  Abdomen is soft, non distended and non tender. No masses are palpable.  There is no rebound and no guarding.  Neurological: Alert and oriented to person, place, and time. Coordination normal.  Skin: Skin is warm and dry. No rash noted. No diaphoretic. No erythema. No pallor.  Psychiatric: Normal mood and affect. Normal behavior. Judgment and thought content normal.    ASSESSMENT: I have independently reviewed the patient's CT scan.  There are multiple bilateral pulmonary nodules.  The more ominous feature was the right middle lobe mass which is essentially unchanged over the last year.  There are several left lower lobe pulmonary nodules that have increased very slightly in size from 3-4 mm.   PLAN:   I would like to get an echocardiogram to assess her cardiac murmur and PVCs.  I would also like to repeat her CT scan in 3 months.  I will discuss with Dr. Jenell Milliner any additional diagnostic testing to be done.  She will come back to see me in 3 months.    Nestor Lewandowsky, MD

## 2017-07-23 NOTE — Telephone Encounter (Signed)
Call made to patient at this time. Left a message stating that we have scheduled her Chest CT on 6/3 and that we will follow up with her on 6/7 with Dr.Oaks. I also advised that we have not yet scheduled her ECG due to waiting for call back from scheduler at this time.

## 2017-07-23 NOTE — ED Triage Notes (Signed)
Pt reports some dizziness and weakness. Was seen at MD office today and was told that her HR was irregular and she had a murmur. Advised pt to come to the ED.

## 2017-07-23 NOTE — Telephone Encounter (Signed)
ECG order has been placed. Left a message with Pamala Hurry in speciality scheduling for her to call back to get this scheduled for patient.

## 2017-07-23 NOTE — Patient Instructions (Signed)
We will contact you once we have the EKG scheduled. We will also contact you once we have the CT scheduled as well.  We will see you back in 3 months and contact you with that appointment.

## 2017-07-23 NOTE — ED Notes (Signed)
Pt states she was at doctors office today (goes ever 4 months for check up for possible lung CA.) was told she has heart murmur and irregular heart rate. Pt admits to having a panic attack in office today. NSR on monitor. No distress noted. Alert, oriented, speaking in compete sentences. Dr. Mariea Clonts at bedside.

## 2017-07-23 NOTE — ED Provider Notes (Addendum)
[facility] Emergency Department Provider Note  ____________________________________________  Time seen: Approximately 3:50 PM  I have reviewed the triage vital signs and the nursing notes.   HISTORY  Chief Complaint Dizziness and Weakness    HPI Andrea Pollard is a 62 y.o. female with a history of HTN, DM and lung nodules presenting for palpitations and panic attack.  The patient reports that she is under a every 4 monthly CT imaging surveillance for lung nodules, under the care of Dr. Genevive Bi, with no evidence of malignancy.  Today, the patient was at Dr. Lenard Forth office when he noted that she seemed to have an irregular rhythm "and may be a murmur?"  On his auscultation.  When Dr. Genevive Bi told the patient his physical exam findings, she does state that she could feel "a small pause," which happened once or twice and then resolved.  She has had one more since she has been here.  She states that she occasionally has had this over the course of several years, but it is always self-limited and there are no associated symptoms.  She has never experienced chest pain, tightness or discomfort, shortness of breath, lightheadedness or fainting, diaphoresis, nausea or vomiting.  Today, she became extremely nervous about this finding, and developed what she describes as "a full out panic attack."  She describes a warm sensation from her legs up her entire body, and "feeling like I was crawling out of my skin."  Over time, the symptoms self resolved as well.  Her last stress test was reportedly normal, several years ago.  Past Medical History:  Diagnosis Date  . Asthma   . Complication of anesthesia   . Diabetes mellitus without complication ([place])   . GERD (gastroesophageal reflux disease)   . 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 History of methicillin resistant staphylococcus aureus (MRSA) 2013  . Hypertension   . Pneumonia 04/2016  . PONV (postoperative nausea and vomiting)     NAUSEATED  . Sleep apnea    CPAP    Patient Active Problem List   Diagnosis Date Noted  . Pulmonary embolism ([place]) 03/04/2017  . Benign hypertension 10/05/2016  . Constipation 10/05/2016  . Gastroesophageal reflux disease 10/05/2016  . Hypercholesterolemia 10/05/2016  . Indigestion 10/05/2016  . Obstructive sleep apnea of adult 10/05/2016  . Type 2 diabetes mellitus ([place]) 10/05/2016  . Vitamin D deficiency 10/05/2016  . Coronary artery disease of native artery of native heart with stable angina pectoris ([place]) 09/27/2016  . Aortic atherosclerosis ([place]) 09/27/2016  . History of smoking 09/27/2016  . Mass of upper lobe of left lung 06/07/2016  . Morbid obesity ([place]) 04/23/2014    Past Surgical History:  Procedure Laterality Date  . ABDOMINAL HYSTERECTOMY    . CESAREAN SECTION    . CHOLECYSTECTOMY    . ELECTROMAGNETIC NAVIGATION BROCHOSCOPY N/A 08/15/2016   Procedure: ELECTROMAGNETIC NAVIGATION BRONCHOSCOPY;  Surgeon: Flora Lipps, MD;  Location: [facility] ORS;  Service: Cardiopulmonary;  Laterality: N/A;  . LAPAROSCOPIC GASTRIC SLEEVE RESECTION      Current Outpatient Rx  . Order #: [id number] Class: Historical Med  . Order #: [id number] Class: Historical Med  . Order #: [id number] Class: Historical Med  . Order #: [id number] Class: Print  . Order #: [id number] Class: Historical Med  . Order #: [id number] Class: Historical Med  . Order #: [id number] Class: Historical Med  . Order #: [id number] Class: Historical Med  . Order #: [id number] Class: Historical Med  . Order #: [id number] Class: Print  . Order #: [id number] Class:  Print  . Order #: [id number] Class: Historical Med  . Order #: [id number] Class: Historical Med  . Order #: [id number] Class: Historical Med  . Order #: [id number] Class: Historical Med  . Order #: [id number] Class: Historical Med    Allergies Ibuprofen; Levaquin [levofloxacin in d5w]; Sulfamethoxazole-trimethoprim; and Nitroglycerin  Family History  Problem Relation Age of Onset   . Stroke Father   . Ovarian cancer Maternal Grandmother   . Breast cancer Neg Hx     Social History Social History   Tobacco Use  . Smoking status: Former Smoker    Packs/day: 1.00    Years: 5.00    Pack years: 5.00    Types: Cigarettes    Last attempt to quit: 08/12/1991    Years since quitting: 25.9  . Smokeless tobacco: Never Used  Substance Use Topics  . Alcohol use: No  . Drug use: No    Review of Systems Constitutional: No fever/chills.  No lightheadedness or syncope.  Eyes: No visual changes. ENT: No sore throat. No congestion or rhinorrhea. Cardiovascular: Denies chest pain.  Positive palpitations. Respiratory: Denies shortness of breath.  No cough. Gastrointestinal: No abdominal pain.  No nausea, no vomiting.  No diarrhea.  No constipation. Genitourinary: Negative for dysuria. Musculoskeletal: Negative for back pain. Skin: Negative for rash. Neurological: Negative for headaches. No focal numbness, tingling or weakness.  Psychiatric:Positive warm sensation.  Positive crawling out of skin sensation. In the setting of significant anxiety and panic.  This is never happened before.  ____________________________________________   PHYSICAL EXAM:  VITAL SIGNS: ED Triage Vitals [07/23/17 1341]  Enc Vitals Group     BP (!) 145/76     Pulse Rate 70     Resp 18     Temp 98 F (36.7 C)     Temp Source Oral     SpO2 98 %     Weight 220 lb (99.8 kg)     Height 5\' 5"  (1.651 m)     Head Circumference      Peak Flow      Pain Score      Pain Loc      Pain Edu?      Excl. in [place]?     Constitutional: Alert and oriented.  Overweight and chronically ill appearing and in no acute distress. Answers questions appropriately.  Resting comfortably in the stretcher with clear speech and good insight. Eyes: Conjunctivae are normal.  EOMI. No scleral icterus. Head: Atraumatic. Nose: No congestion/rhinnorhea. Mouth/Throat: Mucous membranes are moist.  Neck: No stridor.   Supple.  No JVD.  No meningismus. Cardiovascular: Normal rate, regular rhythm. No murmurs, rubs or gallops.  Respiratory: Normal respiratory effort.  No accessory muscle use or retractions. Lungs CTAB.  No wheezes, rales or ronchi. Gastrointestinal: Obese.  Soft, nontender and nondistended.  No guarding or rebound.  No peritoneal signs. Musculoskeletal: Mild nonpitting symmetric lower extremity edema around the ankles. No ttp in the calves or palpable cords.  Negative Homan's sign. Neurologic:  A&Ox3.  Speech is clear.  Face and smile are symmetric.  EOMI.  Moves all extremities well. Skin:  Skin is warm, dry and intact. No rash noted. Psychiatric: Mood and affect are normal. Speech and behavior are normal.  Normal judgement.  At this time, the patient is not having an anxiety. ____________________________________________   LABS (all labs ordered are listed, but only abnormal results are displayed)  Labs Reviewed  BASIC METABOLIC PANEL - Abnormal; Notable for the following components:  Result Value   Glucose, Bld 114 (*)    All other components within normal limits  CBC  TROPONIN I  URINALYSIS, COMPLETE (UACMP) WITH MICROSCOPIC  MAGNESIUM  TSH  CBG MONITORING, ED  POC URINE PREG, ED   ____________________________________________  EKG  ED ECG REPORT I, Eula Listen, the attending physician, personally viewed and interpreted this ECG.   Date: 07/23/2017  EKG Time: 1339  Rate: 69  Rhythm: normal sinus rhythm  Axis: leftward  Intervals:none  ST&T Change: No STEMI  ____________________________________________  RADIOLOGY  No results found.  ____________________________________________   PROCEDURES  Procedure(s) performed: None  Procedures  Critical Care performed: No ____________________________________________   INITIAL IMPRESSION / ASSESSMENT AND PLAN / ED COURSE  Pertinent labs & imaging results that were available during my care of the patient  were reviewed by me and considered in my medical decision making (see chart for details).  62 y.o. female with a history of HTN and DM presenting with being told that she had an irregular heartbeat and possibly a murmur, and then developing a panic attack with the cardiothoracic surgeon who is monitoring her lung nodules.  Overall, the patient is mildly hypertensive at 150/87 but has reassuring vital signs here.  There is no evidence of arrhythmia on her EKG, nor on the monitor during the entirety of my examination today.  Also, I do not auscultate a murmur on my examination.  I have talked to the patient about the possibility of PVCs, nonsustained SVT, or intermittent arrhythmia, which would not be captured here.  Her laboratory studies, including electrolytes such as potassium and sodium are reassuring.  Her blood counts are also normal.  Her magnesium and TSH are pending.  At this time, the patient is not experiencing any severe anxiety or panic and does not require treatment.  Her CT surgeon has already initiated ordering an echocardiogram for her, and I will give her the information for Dr. Chancy Milroy, a cardiologist that she can follow-up with, for review of echocardiogram and possibly a Holter or event monitor..  I have had a long discussion with the patient about her symptoms, workup and results in the emergency department, as well as follow-up plan and return precautions.  At this time, the patient is safe for discharge home.  ----------------------------------------- 4:38 PM on 07/23/2017 -----------------------------------------  I am awaiting the patient's magnesium result.  I have reevaluated her and she now has normal blood pressure 134/79 and otherwise feels completely asymptomatic.  She states that she had one brief episode of a single palpitation, and looked up at the monitor and saw PVC.  She is an EKG tech for living.  I do not see any PVCs when I am in the room with her, but this is on the  differential of possible etiologies of the patient's symptoms.  ____________________________________________  FINAL CLINICAL IMPRESSION(S) / ED DIAGNOSES  Final diagnoses:  Palpitations  Panic attack         NEW MEDICATIONS STARTED DURING THIS VISIT:  New Prescriptions   No medications on file      Eula Listen, MD 07/23/17 1605    Eula Listen, MD 07/23/17 1606    Eula Listen, MD 07/23/17 [phone]

## 2017-07-24 ENCOUNTER — Telehealth: Payer: Self-pay

## 2017-07-24 NOTE — Telephone Encounter (Signed)
Left message for patient to return call to office regarding the appointment listed below.  [facility] 07/26/17 @ 9:45 am.

## 2017-07-25 NOTE — Telephone Encounter (Signed)
Patient notified of [place] 07/26/17 @ 9:45 am. Patient verbalized understanding.

## 2017-07-26 ENCOUNTER — Ambulatory Visit
Admission: RE | Admit: 2017-07-26 | Discharge: 2017-07-26 | Disposition: A | Discharge status: Home / Self Care | Payer: 59 | Source: Ambulatory Visit | Attending: Cardiothoracic Surgery | Admitting: Cardiothoracic Surgery

## 2017-07-26 DIAGNOSIS — J45909 Unspecified asthma, uncomplicated: Secondary | ICD-10-CM | POA: Diagnosis not present

## 2017-07-26 DIAGNOSIS — E119 Type 2 diabetes mellitus without complications: Secondary | ICD-10-CM | POA: Insufficient documentation

## 2017-07-26 DIAGNOSIS — G4730 Sleep apnea, unspecified: Secondary | ICD-10-CM | POA: Diagnosis not present

## 2017-07-26 DIAGNOSIS — I119 Hypertensive heart disease without heart failure: Secondary | ICD-10-CM | POA: Insufficient documentation

## 2017-07-26 DIAGNOSIS — R911 Solitary pulmonary nodule: Secondary | ICD-10-CM | POA: Insufficient documentation

## 2017-07-26 DIAGNOSIS — K219 Gastro-esophageal reflux disease without esophagitis: Secondary | ICD-10-CM | POA: Diagnosis not present

## 2017-07-26 NOTE — Progress Notes (Signed)
*  PRELIMINARY RESULTS* Echocardiogram 2D Echocardiogram has been performed.  Sherrie Sport 07/26/2017, 10:38 AM

## 2017-07-27 ENCOUNTER — Ambulatory Visit: Payer: 59

## 2017-08-23 ENCOUNTER — Ambulatory Visit: Payer: 59 | Admitting: Internal Medicine

## 2017-09-14 ENCOUNTER — Other Ambulatory Visit: Payer: Self-pay

## 2017-09-14 ENCOUNTER — Encounter: Payer: Self-pay | Admitting: Emergency Medicine

## 2017-09-14 ENCOUNTER — Emergency Department: Payer: 59

## 2017-09-14 ENCOUNTER — Emergency Department
Admission: EM | Admit: 2017-09-14 | Discharge: 2017-09-14 | Disposition: A | Discharge status: Home / Self Care | Payer: 59 | Attending: Emergency Medicine | Admitting: Emergency Medicine

## 2017-09-14 DIAGNOSIS — Z87891 Personal history of nicotine dependence: Secondary | ICD-10-CM | POA: Diagnosis not present

## 2017-09-14 DIAGNOSIS — J45909 Unspecified asthma, uncomplicated: Secondary | ICD-10-CM | POA: Diagnosis not present

## 2017-09-14 DIAGNOSIS — I2510 Atherosclerotic heart disease of native coronary artery without angina pectoris: Secondary | ICD-10-CM | POA: Diagnosis not present

## 2017-09-14 DIAGNOSIS — E119 Type 2 diabetes mellitus without complications: Secondary | ICD-10-CM | POA: Insufficient documentation

## 2017-09-14 DIAGNOSIS — Z7984 Long term (current) use of oral hypoglycemic drugs: Secondary | ICD-10-CM | POA: Diagnosis not present

## 2017-09-14 DIAGNOSIS — Z79899 Other long term (current) drug therapy: Secondary | ICD-10-CM | POA: Diagnosis not present

## 2017-09-14 DIAGNOSIS — R109 Unspecified abdominal pain: Secondary | ICD-10-CM | POA: Diagnosis present

## 2017-09-14 DIAGNOSIS — Z7901 Long term (current) use of anticoagulants: Secondary | ICD-10-CM | POA: Insufficient documentation

## 2017-09-14 DIAGNOSIS — I10 Essential (primary) hypertension: Secondary | ICD-10-CM | POA: Diagnosis not present

## 2017-09-14 DIAGNOSIS — N12 Tubulo-interstitial nephritis, not specified as acute or chronic: Secondary | ICD-10-CM | POA: Diagnosis not present

## 2017-09-14 LAB — URINALYSIS, COMPLETE (UACMP) WITH MICROSCOPIC
BILIRUBIN URINE: NEGATIVE
Glucose, UA: NEGATIVE mg/dL
HGB URINE DIPSTICK: NEGATIVE
KETONES UR: NEGATIVE mg/dL
NITRITE: NEGATIVE
PH: 5 (ref 5.0–8.0)
Protein, ur: NEGATIVE mg/dL
Specific Gravity, Urine: 1.023 (ref 1.005–1.030)

## 2017-09-14 LAB — BASIC METABOLIC PANEL
Anion gap: 5 (ref 5–15)
BUN: 19 mg/dL (ref 6–20)
CALCIUM: 8.8 mg/dL — AB (ref 8.9–10.3)
CO2: 32 mmol/L (ref 22–32)
CREATININE: 0.63 mg/dL (ref 0.44–1.00)
Chloride: 102 mmol/L (ref 101–111)
GFR calc Af Amer: >60 mL/min (ref >60)
GFR calc non Af Amer: >60 mL/min (ref >60)
Glucose, Bld: 150 mg/dL — ABNORMAL HIGH (ref 65–99)
Potassium: 3.7 mmol/L (ref 3.5–5.1)
Sodium: 139 mmol/L (ref 135–145)

## 2017-09-14 LAB — CBC
HCT: 39.2 % (ref 35.0–47.0)
HEMOGLOBIN: 13.4 g/dL (ref 12.0–16.0)
MCH: 28.3 pg (ref 26.0–34.0)
MCHC: 34.2 g/dL (ref 32.0–36.0)
MCV: 82.7 fL (ref 80.0–100.0)
Platelets: 215 10*3/uL (ref 150–440)
RBC: 4.74 MIL/uL (ref 3.80–5.20)
RDW: 14.2 % (ref 11.5–14.5)
WBC: 6.8 10*3/uL (ref 3.6–11.0)

## 2017-09-14 MED ORDER — SODIUM CHLORIDE 0.9 % IV SOLN
1.0000 g | Freq: Once | INTRAVENOUS | Status: AC
  Administered 2017-09-14: 1 g via INTRAVENOUS
  Filled 2017-09-14: qty 10

## 2017-09-14 MED ORDER — ONDANSETRON HCL 4 MG/2ML IJ SOLN
4.0000 mg | Freq: Once | INTRAMUSCULAR | Status: AC
  Administered 2017-09-14: 4 mg via INTRAVENOUS
  Filled 2017-09-14: qty 2

## 2017-09-14 MED ORDER — METOCLOPRAMIDE HCL 5 MG/ML IJ SOLN
10.0000 mg | Freq: Once | INTRAMUSCULAR | Status: AC
  Administered 2017-09-14: 10 mg via INTRAVENOUS

## 2017-09-14 MED ORDER — SODIUM CHLORIDE 0.9 % IV BOLUS
1000.0000 mL | Freq: Once | INTRAVENOUS | Status: AC
  Administered 2017-09-14: 1000 mL via INTRAVENOUS

## 2017-09-14 MED ORDER — CEPHALEXIN 500 MG PO CAPS: 500.0000 mg | ORAL_CAPSULE | Freq: Two times a day (BID) | ORAL | 0 refills | Status: AC

## 2017-09-14 MED ORDER — MORPHINE SULFATE (PF) 4 MG/ML IV SOLN
4.0000 mg | Freq: Once | INTRAVENOUS | Status: AC
  Administered 2017-09-14: 4 mg via INTRAVENOUS
  Filled 2017-09-14: qty 1

## 2017-09-14 MED ORDER — HYDROCODONE-ACETAMINOPHEN 5-325 MG PO TABS: 1.0000 | ORAL_TABLET | Freq: Four times a day (QID) | ORAL | 0 refills | Status: AC | PRN

## 2017-09-14 MED ORDER — METOCLOPRAMIDE HCL 5 MG/ML IJ SOLN
INTRAMUSCULAR | Status: AC
  Filled 2017-09-14: qty 2

## 2017-09-14 MED ORDER — OXYCODONE-ACETAMINOPHEN 5-325 MG PO TABS
1.0000 | ORAL_TABLET | Freq: Once | ORAL | Status: AC
  Administered 2017-09-14: 1 via ORAL
  Filled 2017-09-14: qty 1

## 2017-09-14 NOTE — Discharge Instructions (Addendum)
Take the antibiotic as prescribed and finish the full course.  You may take the Norco as needed for pain or over-the-counter Tylenol.  Return to the ER for new, worsening, or persistent pain, fevers, vomiting, inability to take the medications, weakness, or any other new or worsening symptoms that concern you.  You should follow-up with your primary care doctor in approximately 1 week.

## 2017-09-14 NOTE — ED Notes (Signed)
Left sided flank pain that began today. Radiates to right flank also. Pt alert and oriented X4, active, cooperative, pt in NAD. RR even and unlabored, color WNL.

## 2017-09-14 NOTE — ED Provider Notes (Signed)
[facility] Emergency Department Provider Note ____________________________________________   First MD Initiated Contact with Patient 09/14/17 1553     (approximate)  I have reviewed the triage vital signs and the nursing notes.   HISTORY  Chief Complaint Flank Pain    HPI Andrea Pollard is a 62 y.o. female acute onset over the last day, constant, and associated with nausea.  Patient also reports some pain over her bladder.  She denies fever or chills, vomiting, hematuria, or abdominal pain.  She states it feels similar to when she was diagnosed with a kidney stone in January.   Past Medical History:  Diagnosis Date  . Asthma   . Complication of anesthesia   . Diabetes mellitus without complication ([place])   . GERD (gastroesophageal reflux disease)   . Hepatitis    AGE 70-HEPATITIS B  . History of methicillin resistant staphylococcus aureus (MRSA) 2013  . Hypertension   . Pneumonia 04/2016  . PONV (postoperative nausea and vomiting)    NAUSEATED  . Sleep apnea    CPAP    Patient Active Problem List   Diagnosis Date Noted  . Pulmonary embolism ([place]) 03/04/2017  . Benign hypertension 10/05/2016  . Constipation 10/05/2016  . Gastroesophageal reflux disease 10/05/2016  . Hypercholesterolemia 10/05/2016  . Indigestion 10/05/2016  . Obstructive sleep apnea of adult 10/05/2016  . Type 2 diabetes mellitus ([place]) 10/05/2016  . Vitamin D deficiency 10/05/2016  . Coronary artery disease of native artery of native heart with stable angina pectoris ([place]) 09/27/2016  . Aortic atherosclerosis ([place]) 09/27/2016  . History of smoking 09/27/2016  . Mass of upper lobe of left lung 06/07/2016  . Morbid obesity ([place]) 04/23/2014    Past Surgical History:  Procedure Laterality Date  . ABDOMINAL HYSTERECTOMY    . CESAREAN SECTION    . CHOLECYSTECTOMY    . ELECTROMAGNETIC NAVIGATION BROCHOSCOPY N/A 08/15/2016   Procedure: ELECTROMAGNETIC NAVIGATION  BRONCHOSCOPY;  Surgeon: Flora Lipps, MD;  Location: [facility] ORS;  Service: Cardiopulmonary;  Laterality: N/A;  . LAPAROSCOPIC GASTRIC SLEEVE RESECTION      Prior to Admission medications   Medication Sig Start Date End Date Taking? Authorizing Provider  acetaminophen (TYLENOL) 500 MG tablet Take 1,000 mg by mouth every 6 (six) hours as needed for mild pain.    [provider]  albuterol (PROAIR HFA) 108 (90 Base) MCG/ACT inhaler Inhale 2 Inhalers into the lungs as needed.    [provider]  atorvastatin (LIPITOR) 80 MG tablet Take 80 mg by mouth daily. 08/30/16   [provider]  cephALEXin (KEFLEX) 500 MG capsule Take 1 capsule (500 mg total) by mouth 2 (two) times daily for 10 days. 09/14/17 09/24/17  Arta Silence, MD  chlorpheniramine-HYDROcodone (TUSSIONEX) 10-8 MG/5ML SUER TAKE 5 MLS( 1 TEASPOONFUL) BY MOUTH AT BEDTIME AS NEEDED COUGH 06/08/17   [provider]  citalopram (CELEXA) 10 MG tablet Take 10 mg by mouth daily. 10/11/16   [provider]  FLOVENT HFA 220 MCG/ACT inhaler Inhale 2 puffs into the lungs 2 (two) times daily. 12/11/16   [provider]  fluticasone (FLONASE) 50 MCG/ACT nasal spray Place 2 sprays into both nostrils 1 day or 1 dose.    [provider]  hydrochlorothiazide (HYDRODIURIL) 12.5 MG tablet Take 12.5 mg by mouth daily.    [provider]  HYDROcodone-acetaminophen (NORCO) 5-325 MG tablet Take 1 tablet by mouth every 6 (six) hours as needed for up to 3 days for moderate pain. 09/14/17  09/17/17  Arta Silence, MD  lansoprazole (PREVACID) 15 MG capsule Take 15 mg by mouth daily at 12 noon.    [provider]  metFORMIN (GLUCOPHAGE-XR) 500 MG 24 hr tablet Take 2 tablets by mouth 2 (two) times daily. 03/23/16   [provider]  Multiple Vitamin (MULTI-VITAMIN DAILY PO) Take 1 mL by mouth 1 day or 1 dose.    [provider]  omeprazole (PRILOSEC) 20 MG capsule Take 20 mg  by mouth 1 day or 1 dose.    [provider]  XARELTO 20 MG TABS tablet TAKE 1 TABLET BY MOUTH ONCE A DAY WITH FOOD 11/29/16   [provider]    Allergies Ibuprofen; Levaquin [levofloxacin in d5w]; Sulfamethoxazole-trimethoprim; and Nitroglycerin  Family History  Problem Relation Age of Onset  . Stroke Father   . Ovarian cancer Maternal Grandmother   . Breast cancer Neg Hx     Social History Social History   Tobacco Use  . Smoking status: Former Smoker    Packs/day: 1.00    Years: 5.00    Pack years: 5.00    Types: Cigarettes    Last attempt to quit: 08/12/1991    Years since quitting: 26.1  . Smokeless tobacco: Never Used  Substance Use Topics  . Alcohol use: No  . Drug use: No    Review of Systems  Constitutional: No fever. Eyes: No redness. ENT: No sore throat. Cardiovascular: Denies chest pain. Respiratory: Denies shortness of breath. Gastrointestinal: Positive for nausea. Genitourinary: Negative for hematuria.  Musculoskeletal: Positive for back pain. Skin: Negative for rash. Neurological: Negative for headache.   ____________________________________________   PHYSICAL EXAM:  VITAL SIGNS: ED Triage Vitals  Enc Vitals Group     BP 09/14/17 1446 (!) 132/91     Pulse Rate 09/14/17 1446 63     Resp 09/14/17 1446 18     Temp 09/14/17 1446 97.9 F (36.6 C)     Temp Source 09/14/17 1446 Oral     SpO2 09/14/17 1446 98 %     Weight 09/14/17 1447 200 lb (90.7 kg)     Height 09/14/17 1447 5\' 6"  (1.676 m)     Head Circumference --      Peak Flow --      Pain Score 09/14/17 1446 10     Pain Loc --      Pain Edu? --      Excl. in [place]? --     Constitutional: Alert and oriented. Well appearing and in no acute distress. Eyes: Conjunctivae are normal.  Head: Atraumatic. Nose: No congestion/rhinnorhea. Mouth/Throat: Mucous membranes are moist.   Neck: Normal range of motion.  Cardiovascular:  Good peripheral circulation. Respiratory:  Normal respiratory effort.   Gastrointestinal: Soft and nontender. No distention.  Genitourinary: Mild left CVA tenderness. Musculoskeletal: Extremities warm and well perfused.  Neurologic:  Normal speech and language. No gross focal neurologic deficits are appreciated.  Skin:  Skin is warm and dry. No rash noted. Psychiatric: Mood and affect are normal. Speech and behavior are normal.  ____________________________________________   LABS (all labs ordered are listed, but only abnormal results are displayed)  Labs Reviewed  URINALYSIS, COMPLETE (UACMP) WITH MICROSCOPIC - Abnormal; Notable for the following components:      Result Value   Color, Urine YELLOW (*)    APPearance HAZY (*)    Leukocytes, UA MODERATE (*)    Bacteria, UA RARE (*)    All other components within normal limits  BASIC  METABOLIC PANEL - Abnormal; Notable for the following components:   Glucose, Bld 150 (*)    Calcium 8.8 (*)    All other components within normal limits  CBC   ____________________________________________  EKG   ____________________________________________  RADIOLOGY  CT abdomen: Right lower pole kidney stone.  No ureteral stone or other acute abnormalities  ____________________________________________   PROCEDURES  Procedure(s) performed: No  Procedures  Critical Care performed: No ____________________________________________   INITIAL IMPRESSION / ASSESSMENT AND PLAN / ED COURSE  Pertinent labs & imaging results that were available during my care of the patient were reviewed by me and considered in my medical decision making (see chart for details).  62 year old female with PMH as noted above presents with left flank pain over the last day associate with nausea and some pain over her bladder but not really dysuria or hematuria.  I reviewed the past medical records in epic; the patient was diagnosed with UTI in January of this year and was in the ED for right flank pain  around that time, with imaging showing 6 mm right renal stone but no ureteral stone or hydronephrosis.  On exam, vitals are normal, the patient is well-appearing, and there is some left CVA tenderness but no abdominal tenderness.  Differential includes most likely UTI/pyelonephritis, versus ureteral stone.  Also consider musculoskeletal cause.  Given reassuring vital signs, patient's well appearance, and the recurrence of similar symptoms, there is no evidence of vascular etiology.  Plan: Labs, UA, CT renal stone study, and reassess.   ----------------------------------------- 7:27 PM on 09/14/2017 -----------------------------------------  CT showed right renal stone but no ureteral stone or hydronephrosis.  Patient's UA is consistent with UTI.  Patient has prior history of UTI in late December/early January that was treated with Keflex successfully.  I will give a 10-day course of the same for likely early pyelonephritis.  The patient reports significantly improved pain and feels well to go home.  I explained the results of the work-up and gave the patient return precautions; she expresses understanding. ____________________________________________   FINAL CLINICAL IMPRESSION(S) / ED DIAGNOSES  Final diagnoses:  Pyelonephritis      NEW MEDICATIONS STARTED DURING THIS VISIT:  New Prescriptions   CEPHALEXIN (KEFLEX) 500 MG CAPSULE    Take 1 capsule (500 mg total) by mouth 2 (two) times daily for 10 days.   HYDROCODONE-ACETAMINOPHEN (NORCO) 5-325 MG TABLET    Take 1 tablet by mouth every 6 (six) hours as needed for up to 3 days for moderate pain.     Note:  This document was prepared using Dragon voice recognition software and may include unintentional dictation errors. With PMH as noted below who presents with left flank pain,   Arta Silence, MD 09/14/17 Kathyrn [place]

## 2017-09-14 NOTE — ED Triage Notes (Signed)
Pt comes into the ED via POV c/o flank pain on the left.  Patient believes it may be a kidney stone.  Patient in NAD at this time.  Patient has nausea associated with it.  Denies any urinary symptoms other than discomfort when urinating.

## 2017-09-14 NOTE — ED Notes (Signed)
ED Provider at bedside. 

## 2017-09-14 NOTE — ED Notes (Signed)
Report to Rebecca, RN.

## 2017-10-19 ENCOUNTER — Ambulatory Visit: Payer: Self-pay | Admitting: Cardiothoracic Surgery

## 2017-10-22 ENCOUNTER — Ambulatory Visit: Admit: 2017-10-22 | Payer: 59

## 2017-10-22 ENCOUNTER — Ambulatory Visit
Admission: RE | Admit: 2017-10-22 | Discharge: 2017-10-22 | Disposition: A | Discharge status: Home / Self Care | Payer: 59 | Source: Ambulatory Visit | Attending: Cardiothoracic Surgery | Admitting: Cardiothoracic Surgery

## 2017-10-22 DIAGNOSIS — R918 Other nonspecific abnormal finding of lung field: Secondary | ICD-10-CM | POA: Diagnosis not present

## 2017-10-22 DIAGNOSIS — R911 Solitary pulmonary nodule: Secondary | ICD-10-CM

## 2017-10-22 DIAGNOSIS — I700 Atherosclerosis of aorta: Secondary | ICD-10-CM | POA: Insufficient documentation

## 2017-10-23 ENCOUNTER — Other Ambulatory Visit: Payer: Self-pay

## 2017-10-25 ENCOUNTER — Telehealth: Payer: Self-pay

## 2017-10-25 ENCOUNTER — Encounter: Payer: Self-pay | Admitting: Cardiothoracic Surgery

## 2017-10-25 ENCOUNTER — Ambulatory Visit (INDEPENDENT_AMBULATORY_CARE_PROVIDER_SITE_OTHER): Payer: 59 | Admitting: Cardiothoracic Surgery

## 2017-10-25 VITALS — BP 135/83 | HR 84 | Temp 98.2°F | Resp 18 | Ht 66.0 in | Wt 223.8 lb

## 2017-10-25 DIAGNOSIS — R918 Other nonspecific abnormal finding of lung field: Secondary | ICD-10-CM | POA: Diagnosis not present

## 2017-10-25 NOTE — Patient Instructions (Signed)
We would like for you to follow up with Dr.Kasa next week 11/01/17 as well as Dr.Finnegan. I will call and get an appointment for you to see Dr.Finnegan and call you later today with the appointment information.

## 2017-10-25 NOTE — Progress Notes (Signed)
  Patient ID: Andrea Pollard, female   DOB: 09/03/1955, 62 y.o.   MRN: [id number]  HISTORY: She returns today in follow-up.  She did have a repeat CT scan.  She has no particular problems today.  She is not short of breath.  She states that she continues to remain anxious about the underlying diagnosis of a right upper lobe mass.   Vitals:   10/25/17 0805  BP: 135/83  Pulse: 84  Resp: 18  Temp: 98.2 F (36.8 C)  SpO2: 94%     EXAM:    Resp: Lungs are clear bilaterally.  No respiratory distress, normal effort. Heart:  Regular without murmurs Abd:  Abdomen is soft, non distended and non tender. No masses are palpable.  There is no rebound and no guarding.  Neurological: Alert and oriented to person, place, and time. Coordination normal.  Skin: Skin is warm and dry. No rash noted. No diaphoretic. No erythema. No pallor.  Psychiatric: Normal mood and affect. Normal behavior. Judgment and thought content normal.    ASSESSMENT: I have independently reviewed the patient's CT scan.  The right upper lobe mass measures about 1.5 cm and is grossly unchanged from the scan in February.  However there are new nodules present and/or slightly increased in size scattered throughout the lungs.  This certainly raises the possibility of metastatic disease.  Inflammatory disease is also possible and a specific diagnosis has not been made despite a endobronchial biopsy.   PLAN:   I spent a great period of time reviewing with the patient and her CT scans and discussing the options.  I explained to her that my only therapeutic option would be right upper lobectomy which she is steadfastly been against.  Certainly in the presence of metastatic disease this would not be an option.  However I have no further input into the patient's care except for surgery and therefore I have asked her to follow-up with Dr. Woody Seller and Dr. Delight Hoh.  She has seen both of these physicians in the past.  Perhaps  they may be able to sort out her issues.  I explained to her that I be more than happy to see her at any point in time if she needs it.    Nestor Lewandowsky, MD

## 2017-10-25 NOTE — Telephone Encounter (Signed)
Patient notified of Dr.Finnegan 10/30/17 @ 9:15 am appointment.

## 2017-10-26 ENCOUNTER — Ambulatory Visit: Payer: Self-pay | Admitting: Cardiothoracic Surgery

## 2017-10-27 NOTE — Progress Notes (Signed)
[facility]  [phone] [phone]  ID: Andrea Pollard OB: 09-Jul-1955  MR#: [id number]  AOZ#:[id number]  Patient Care Team: Ellamae Sia, MD as PCP - General (Internal Medicine) Minna Merritts, MD as Consulting Physician (Cardiology) Telford Nab, RN as Registered Nurse  CHIEF COMPLAINT: Mass of upper lobe of right lung, DVT/PE.  INTERVAL HISTORY: Patient was last evaluated in clinic in October 2018.  She is referred back for concern of progression of known pulmonary nodules.  She is anxious, but otherwise feels well. She has no neurologic complaints. She denies any recent fevers or illnesses. She has a good appetite and denies weight loss. She she denies any shortness of breath, cough, chest pain, or hemoptysis. She denies any nausea, vomiting, constipation, or diarrhea. She has no urinary complaints.  Patient offers no specific complaints today.  REVIEW OF SYSTEMS:   Review of Systems  Constitutional: Negative.  Negative for fever, malaise/fatigue and weight loss.  Respiratory: Negative.  Negative for cough, hemoptysis and shortness of breath.   Cardiovascular: Negative.  Negative for chest pain and leg swelling.  Gastrointestinal: Negative.  Negative for abdominal pain.  Genitourinary: Negative.  Negative for dysuria.  Musculoskeletal: Negative.  Negative for back pain.  Skin: Negative.  Negative for rash.  Neurological: Negative.  Negative for sensory change, focal weakness and weakness.  Psychiatric/Behavioral: The patient is nervous/anxious. The patient does not have insomnia.     As per HPI. Otherwise, a complete review of systems is negative.  PAST MEDICAL HISTORY: Past Medical History:  Diagnosis Date  . Asthma   . Complication of anesthesia   . Diabetes mellitus without complication ([place])   . GERD (gastroesophageal reflux disease)   . 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 History of methicillin resistant staphylococcus  aureus (MRSA) 2013  . Hypertension   . Pneumonia 04/2016  . PONV (postoperative nausea and vomiting)    NAUSEATED  . Sleep apnea    CPAP    PAST SURGICAL HISTORY: Past Surgical History:  Procedure Laterality Date  . ABDOMINAL HYSTERECTOMY    . CESAREAN SECTION    . CHOLECYSTECTOMY    . ELECTROMAGNETIC NAVIGATION BROCHOSCOPY N/A 08/15/2016   Procedure: ELECTROMAGNETIC NAVIGATION BRONCHOSCOPY;  Surgeon: Flora Lipps, MD;  Location: [facility] ORS;  Service: Cardiopulmonary;  Laterality: N/A;  . LAPAROSCOPIC GASTRIC SLEEVE RESECTION      FAMILY HISTORY: Family History  Problem Relation Age of Onset  . Stroke Father   . Ovarian cancer Maternal Grandmother   . Breast cancer Neg Hx     ADVANCED DIRECTIVES (Y/N):  N  HEALTH MAINTENANCE: Social History   Tobacco Use  . Smoking status: Former Smoker    Packs/day: 1.00    Years: 5.00    Pack years: 5.00    Types: Cigarettes    Last attempt to quit: 08/12/1991    Years since quitting: 26.2  . Smokeless tobacco: Never Used  Substance Use Topics  . Alcohol use: No  . Drug use: No     Colonoscopy:  PAP:  Bone density:  Lipid panel:  Allergies  Allergen Reactions  . Ibuprofen Other (See Comments)    Cannot Use due to Gastric bypass  . Levaquin [Levofloxacin In D5w] Hives  . Sulfamethoxazole-Trimethoprim Hives  . Nitroglycerin Other (See Comments)    Hypotension    Current Outpatient Medications  Medication Sig Dispense Refill  . acetaminophen (TYLENOL) 500 MG tablet Take 1,000 mg by mouth every 6 (six) hours as  needed for mild pain.    Marland Kitchen albuterol (PROAIR HFA) 108 (90 Base) MCG/ACT inhaler Inhale 2 Inhalers into the lungs as needed.    Marland Kitchen atorvastatin (LIPITOR) 80 MG tablet Take 80 mg by mouth daily.  2  . busPIRone (BUSPAR) 5 MG tablet Take 5 mg by mouth 2 (two) times daily.    . citalopram (CELEXA) 10 MG tablet Take 10 mg by mouth daily.  2  . FLOVENT HFA 220 MCG/ACT inhaler Inhale 2 puffs into the lungs 2 (two) times  daily.  5  . fluticasone (FLONASE) 50 MCG/ACT nasal spray Place 2 sprays into both nostrils 1 day or 1 dose.    . hydrochlorothiazide (HYDRODIURIL) 12.5 MG tablet Take 12.5 mg by mouth daily.    . metFORMIN (GLUCOPHAGE-XR) 500 MG 24 hr tablet Take 2 tablets by mouth 2 (two) times daily.  2  . Multiple Vitamin (MULTI-VITAMIN DAILY PO) Take 1 mL by mouth 1 day or 1 dose.    Marland Kitchen omeprazole (PRILOSEC) 20 MG capsule Take 20 mg by mouth 1 day or 1 dose.    Marland Kitchen XARELTO 20 MG TABS tablet TAKE 1 TABLET BY MOUTH ONCE A DAY WITH FOOD  0   No current facility-administered medications for this visit.     OBJECTIVE: Vitals:   10/30/17 0850  BP: 130/86  Pulse: 77  Temp: (!) 97.2 F (36.2 C)     Body mass index is 36.12 kg/m.    ECOG FS:0 - Asymptomatic  General: Well-developed, well-nourished, no acute distress. Eyes: Pink conjunctiva, anicteric sclera. Lungs: Clear to auscultation bilaterally. Heart: Regular rate and rhythm. No rubs, murmurs, or gallops. Abdomen: Soft, nontender, nondistended. No organomegaly noted, normoactive bowel sounds. Musculoskeletal: No edema, cyanosis, or clubbing. Neuro: Alert, answering all questions appropriately. Cranial nerves grossly intact. Skin: No rashes or petechiae noted. Psych: Normal affect.  LAB RESULTS:  Lab Results  Component Value Date   NA 139 09/14/2017   K 3.7 09/14/2017   CL 102 09/14/2017   CO2 32 09/14/2017   GLUCOSE 150 (H) 09/14/2017   BUN 19 09/14/2017   CREATININE 0.63 09/14/2017   CALCIUM 8.8 (L) 09/14/2017   PROT 7.8 11/28/2016   ALBUMIN 4.1 11/28/2016   AST 25 11/28/2016   ALT 22 11/28/2016   ALKPHOS 87 11/28/2016   BILITOT 0.7 11/28/2016   GFRNONAA >60 09/14/2017   GFRAA >60 09/14/2017    Lab Results  Component Value Date   WBC 6.8 09/14/2017   NEUTROABS 5.6 05/29/2017   HGB 13.4 09/14/2017   HCT 39.2 09/14/2017   MCV 82.7 09/14/2017   PLT 215 09/14/2017     STUDIES: Ct Chest Wo Contrast  Result Date:  10/22/2017 CLINICAL DATA:  Follow-up pulmonary nodule EXAM: CT CHEST WITHOUT CONTRAST TECHNIQUE: Multidetector CT imaging of the chest was performed following the standard protocol without IV contrast. COMPARISON:  07/11/2017 FINDINGS: Cardiovascular: The heart is normal in size. No pericardial effusion. No evidence of thoracic aortic aneurysm. Atherosclerotic calcifications of the aortic arch. Coronary atherosclerosis of the LAD and right coronary artery. Mediastinum/Nodes: No suspicious mediastinal lymphadenopathy. Visualized right thyroid is notable for a stable 2.3 cm nodule. Lungs/Pleura: 1.6 x 1.3 cm spiculated right upper lobe nodule (series 3/image 61), grossly unchanged from most recent CT, FDG avid on prior PET. Numerous bilateral pulmonary nodules, measuring up to 5 mm, progressive across prior studies and worrisome for metastatic disease. Two dominant nodules which are clearly progressive include: --5 mm nodule in the superior segment left lower  lobe (series 3/image 87) --5 mm nodule in the posterior left lung base (series 3/image 111) No focal consolidation. No pleural effusion or pneumothorax. Upper Abdomen: Visualized upper abdomen is notable for postsurgical changes involving the stomach and prior cholecystectomy. Musculoskeletal: Degenerative changes of the visualized thoracolumbar spine. IMPRESSION: 1.6 x 1.3 cm spiculated right upper lobe nodule, grossly unchanged from recent CT, although FDG avid on prior PET and worrisome for primary bronchogenic neoplasm. Numerous bilateral pulmonary nodules measuring up to 5 mm, progressive across prior studies, worrisome for metastatic disease. Aortic Atherosclerosis (ICD10-I70.0). Electronically Signed   By: Julian Hy M.D.   On: 10/22/2017 11:27    ASSESSMENT: Mass of upper lobe of right lung, acute PE/left leg DVT.  PLAN:    1. Mass of upper lobe of right lung: CT scan results from October 22, 2017 reviewed independently and report as above with  stable spiculated right upper lobe nodule and mild progression of numerous bilateral pulmonary nodules.  Patient has appointment with pulmonary later this week who ultimately recommended repeat ENB.  Previously, patient had a negative biopsy.  Patient will follow-up 1 to 2 weeks after her procedure to discuss the results and further evaluation. 2. Bilateral PE/left leg DVT: Diagnosed by CT scan on November 28, 2016. Patient has no obvious transient risk factors. Hypercoagulable workup is negative except for an elevated DRVVT which can be attributed to her anticoagulation. No intervention is needed at this time.  Approximately 30 minutes was spent in discussion of which greater than 50% was consultation.    Patient expressed understanding and was in agreement with this plan. She also understands that She can call clinic at any time with any questions, concerns, or complaints.   Cancer Staging No matching staging information was found for the patient.  Lloyd Huger, MD   11/01/2017 9:54 PM

## 2017-10-30 ENCOUNTER — Encounter: Payer: Self-pay | Admitting: Oncology

## 2017-10-30 ENCOUNTER — Other Ambulatory Visit: Payer: Self-pay

## 2017-10-30 ENCOUNTER — Inpatient Hospital Stay: Payer: 59 | Attending: Oncology | Admitting: Oncology

## 2017-10-30 VITALS — BP 130/86 | HR 77 | Temp 97.2°F | Wt 223.8 lb

## 2017-10-30 DIAGNOSIS — Z86718 Personal history of other venous thrombosis and embolism: Secondary | ICD-10-CM | POA: Insufficient documentation

## 2017-10-30 DIAGNOSIS — R918 Other nonspecific abnormal finding of lung field: Secondary | ICD-10-CM

## 2017-10-30 DIAGNOSIS — Z86711 Personal history of pulmonary embolism: Secondary | ICD-10-CM | POA: Diagnosis not present

## 2017-10-30 NOTE — Progress Notes (Signed)
Patient has been followed by Dr. Genevive Bi and Dr. Mortimer Fries regarding lung nodule.  They would like her to see Dr. Grayland Ormond today due changes to lung nodule on recent CT scan.  Patient has a headache today 6/10 and she thinks it is due to stress/anxiety concerning what news she may get today about her diagnosis.

## 2017-10-31 IMAGING — DX DG HAND COMPLETE 3+V*L*
3 series · 3 of 3 positions shown · non-contrast
Comparison: None.

CLINICAL DATA: Fall.  Pain

EXAM:
LEFT HAND - COMPLETE 3+ VIEW

[hand ap]
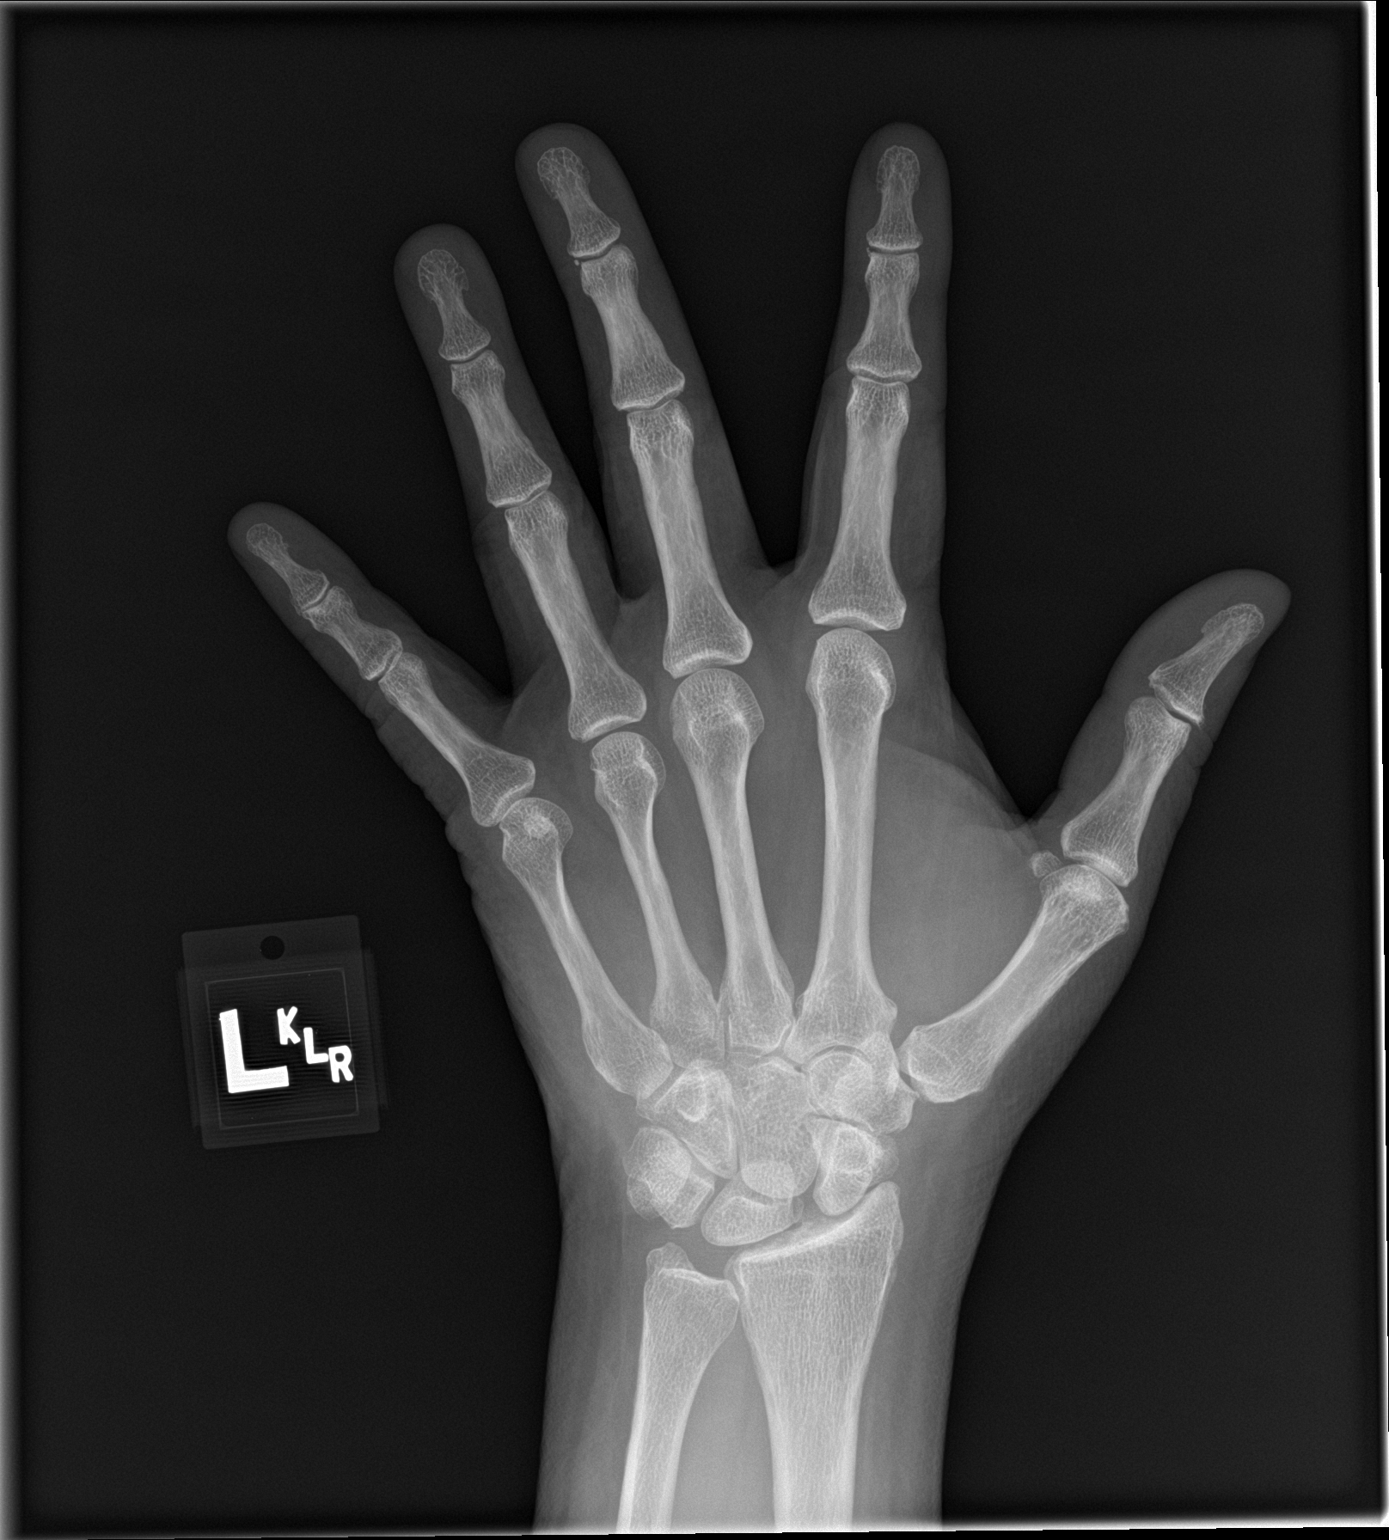

[hand obl]
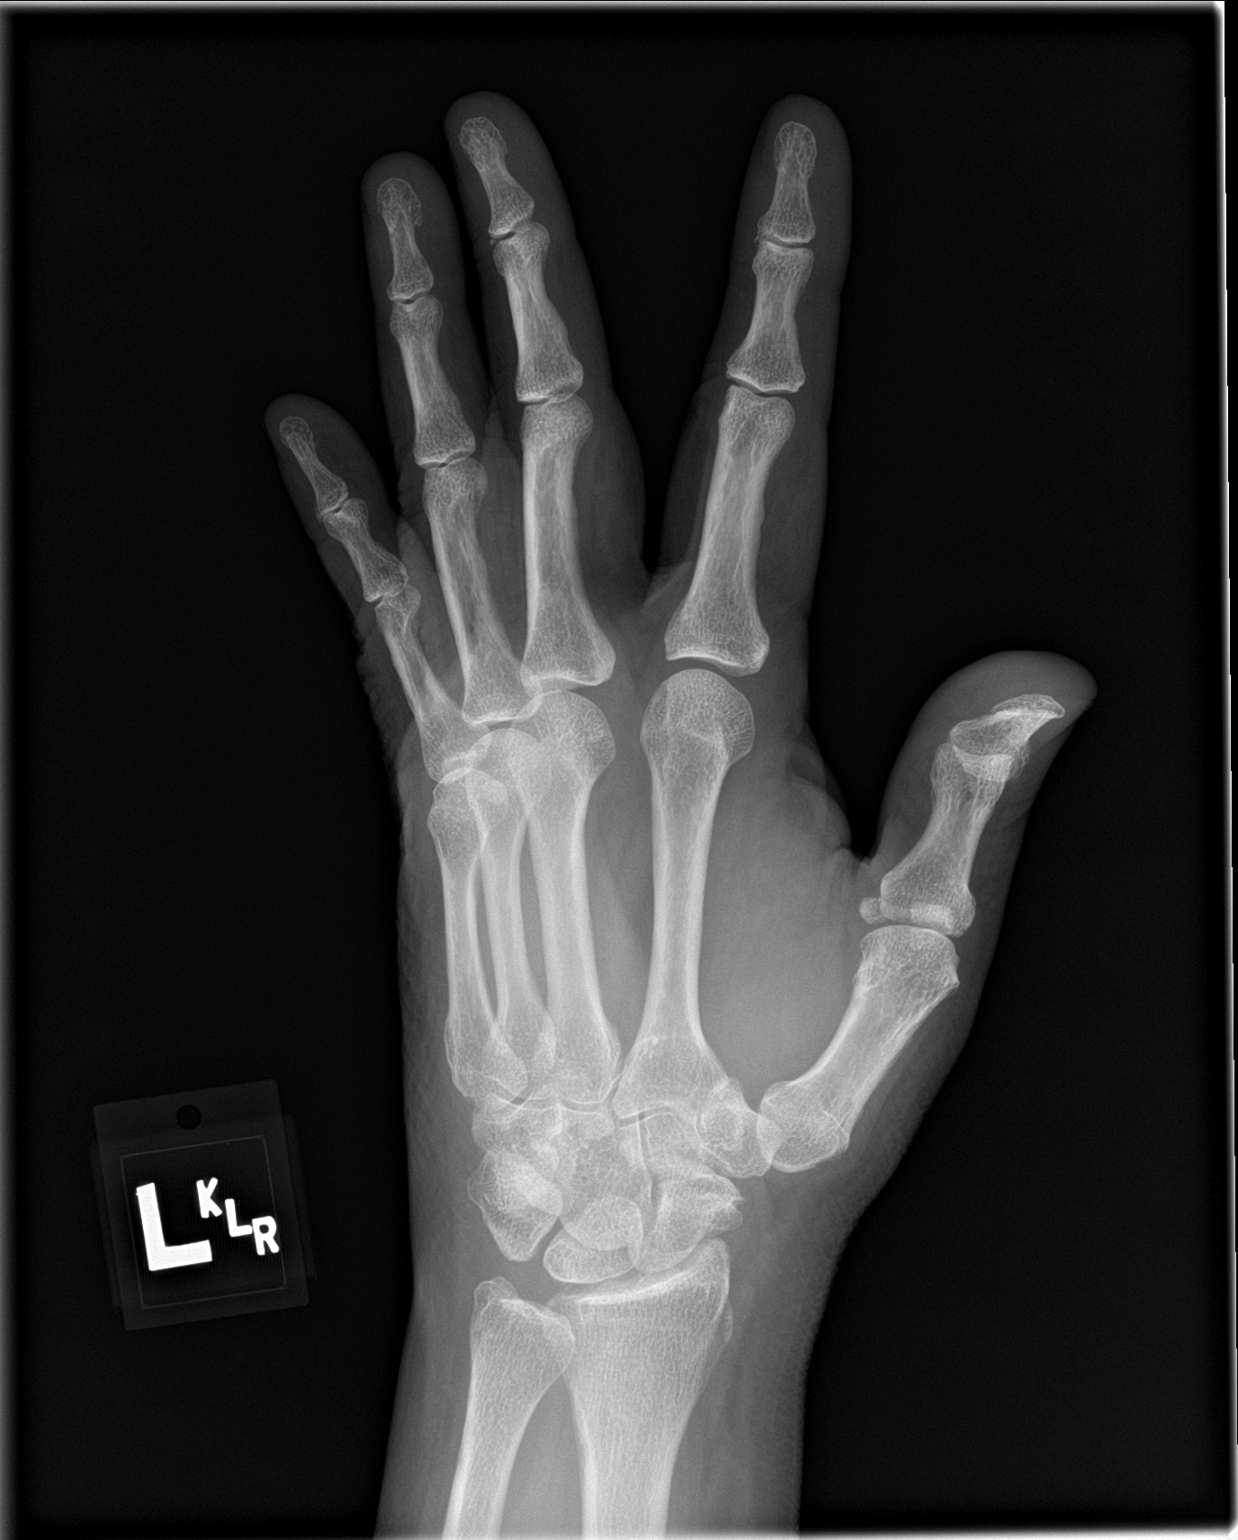

[hand lat]
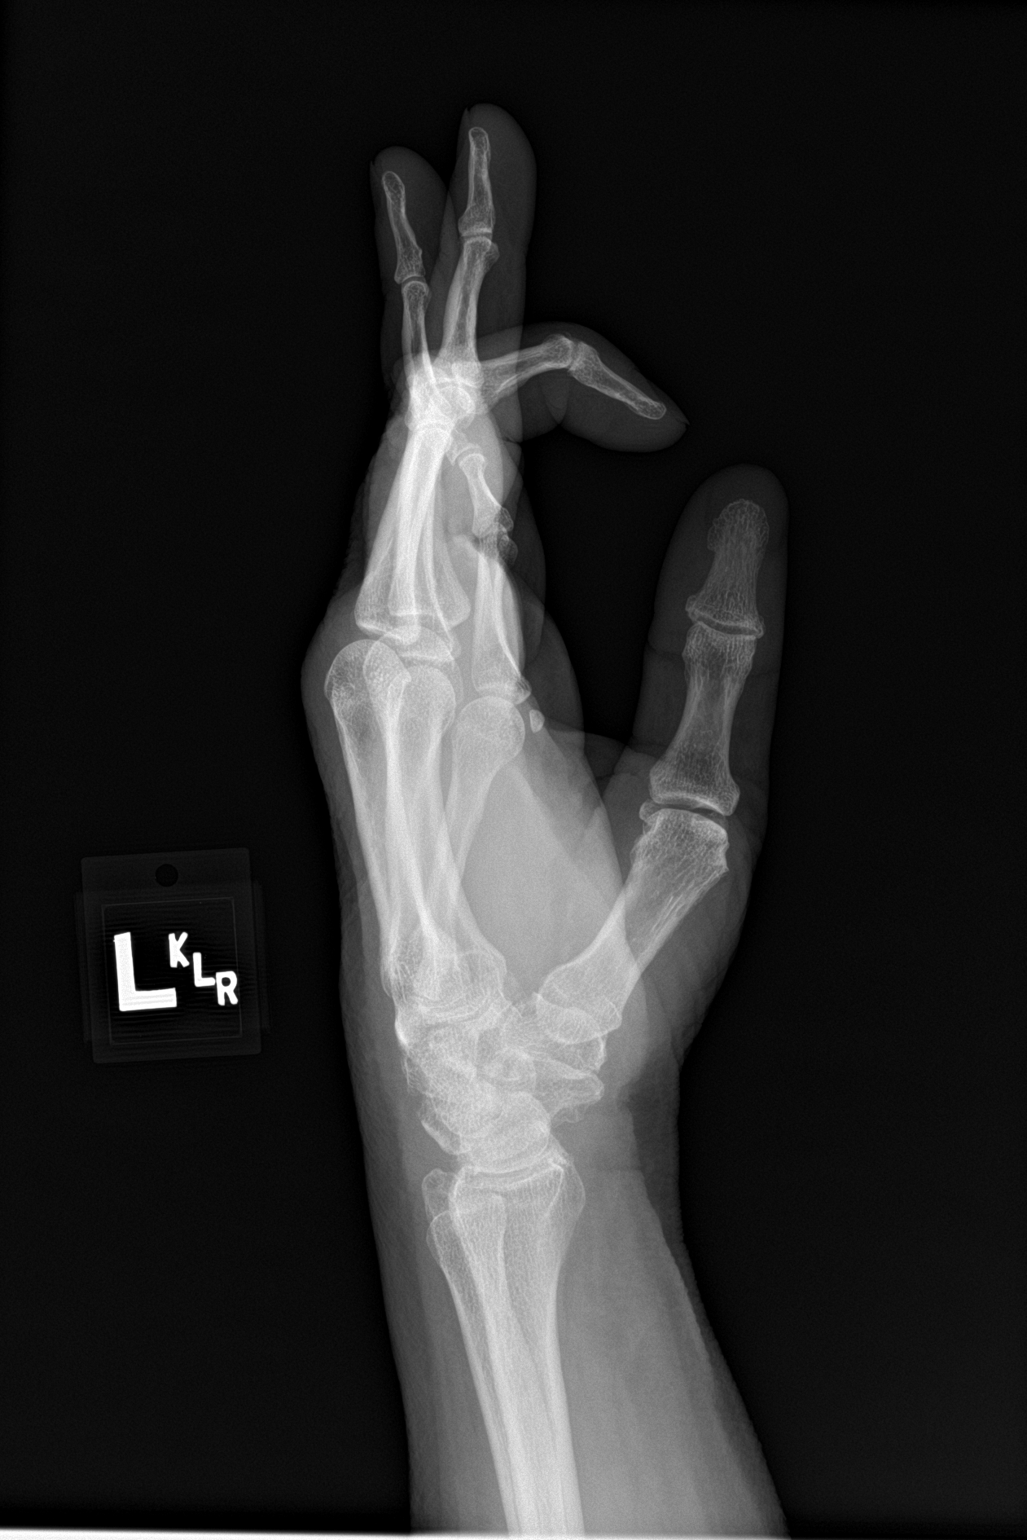

[3 of 3 positions shown; findings below may reference images not displayed]

FINDINGS: There is no evidence of fracture or dislocation. There is no
evidence of arthropathy or other focal bone abnormality. Soft
tissues are unremarkable.
IMPRESSION: Negative.

## 2017-11-01 ENCOUNTER — Encounter: Payer: Self-pay | Admitting: Internal Medicine

## 2017-11-01 ENCOUNTER — Telehealth: Payer: Self-pay | Admitting: *Deleted

## 2017-11-01 ENCOUNTER — Ambulatory Visit (INDEPENDENT_AMBULATORY_CARE_PROVIDER_SITE_OTHER): Payer: 59 | Admitting: Internal Medicine

## 2017-11-01 VITALS — BP 130/82 | HR 71 | Ht 66.0 in | Wt 225.0 lb

## 2017-11-01 DIAGNOSIS — R911 Solitary pulmonary nodule: Secondary | ICD-10-CM

## 2017-11-01 NOTE — H&P (View-Only) (Signed)
[facility] Consultation      Date: 11/01/2017,   MRN# [id number] Andrea Pollard [birth year]   AdmissionWeight: 225 lb (102.1 kg)                 CurrentWeight: 225 lb (102.1 kg) Andrea Pollard is a 62 y.o. old female seen in consultation for RUL nodule at the request of Dr. Oleta Mouse.     CHIEF COMPLAINT:   Follow up lung nodules   HISTORY OF PRESENT ILLNESS  62 yo white female seen today for follow up abnormal CT chest/PET scan patient has been dx with ASTHMA in her 23's She has been on albuterol as needed It has been very  well controlled over the past 10 years Former smoker 25 years ago 1 ppd for 10 years    COPD noted on PFT's Ratio 71% predicted FEV1 is 1.73 L 68% predicted Positive bronchodilator response of FEV1 FEF 25/75 is 1.1 L which is 40% predicted  Radiographic history January 2018 CT chest shows right upper lobe nodule 2 x 1.3 cm in size February 2018 CT chest shows right upper lobe nodule 1.8 x 1.2 cm in size May 2018 CT chest shows right upperlobe nodule 1.3 x 1.0 cm in size July CT chest 2018 RUL  Lobe nodule 1.3x1.0 unchanged from previous CT chest +b/l PE was noted  August CT chest 2018 RUL lung nodule 1.3x1.0 cm unchanged from previous scan NOV 2018 CT chest  RUL lung nodule 1.3x1.0 cm unchanged from previous scan CT chest 6.3.19 RUL nodule 1.6 x1.3 CM unchanged for 1 year-  Patient had undergone ENB which was nondiagnostic in Jan 2017 for the RUL nodule  In July 2018, patient dx with PE and DVT placed on [place] for repeat ENB in next 2 weeks  She has no acute issues at this time Resp status back to normal Occasional SOB and wheezing with cough No signs of infection at this time No signs of acute heart failure at this time    Current Outpatient Medications:  .  acetaminophen (TYLENOL) 500 MG tablet, Take 1,000 mg by mouth every 6 (six) hours as needed for mild pain., Disp: , Rfl:  .  albuterol (PROAIR HFA)  108 (90 Base) MCG/ACT inhaler, Inhale 2 Inhalers into the lungs as needed., Disp: , Rfl:  .  atorvastatin (LIPITOR) 80 MG tablet, Take 80 mg by mouth daily., Disp: , Rfl: 2 .  busPIRone (BUSPAR) 5 MG tablet, Take 5 mg by mouth 2 (two) times daily., Disp: , Rfl:  .  citalopram (CELEXA) 10 MG tablet, Take 10 mg by mouth daily., Disp: , Rfl: 2 .  FLOVENT HFA 220 MCG/ACT inhaler, Inhale 2 puffs into the lungs 2 (two) times daily., Disp: , Rfl: 5 .  fluticasone (FLONASE) 50 MCG/ACT nasal spray, Place 2 sprays into both nostrils 1 day or 1 dose., Disp: , Rfl:  .  hydrochlorothiazide (HYDRODIURIL) 12.5 MG tablet, Take 12.5 mg by mouth daily., Disp: , Rfl:  .  metFORMIN (GLUCOPHAGE-XR) 500 MG 24 hr tablet, Take 2 tablets by mouth 2 (two) times daily., Disp: , Rfl: 2 .  Multiple Vitamin (MULTI-VITAMIN DAILY PO), Take 1 mL by mouth 1 day or 1 dose., Disp: , Rfl:  .  omeprazole (PRILOSEC) 20 MG capsule, Take 20 mg by mouth 1 day or 1 dose., Disp: , Rfl:  .  XARELTO 20 MG TABS tablet, TAKE 1 TABLET BY MOUTH ONCE A DAY WITH FOOD, Disp: ,  Rfl: 0    ALLERGIES   Ibuprofen; Levaquin [levofloxacin in d5w]; Sulfamethoxazole-trimethoprim; and Nitroglycerin     REVIEW OF SYSTEMS   Review of Systems  Constitutional: Negative for chills, diaphoresis, fever, malaise/fatigue and weight loss.  HENT: Negative for congestion and hearing loss.   Eyes: Negative for blurred vision and double vision.  Respiratory: Negative for cough, hemoptysis, sputum production, shortness of breath and wheezing.   Cardiovascular: Negative for chest pain, palpitations and orthopnea.  Gastrointestinal: Negative for abdominal pain, heartburn, nausea and vomiting.  Genitourinary: Negative for dysuria and urgency.  Musculoskeletal: Negative for myalgias and neck pain.  Skin: Negative for rash.  Neurological: Negative for dizziness, tingling, tremors, weakness and headaches.  Endo/Heme/Allergies: Does not bruise/bleed easily.    Psychiatric/Behavioral: Negative for depression, substance abuse and suicidal ideas.  All other systems reviewed and are negative.   BP 130/82 (BP Location: Left Arm, Cuff Size: Normal)   Pulse 71   Ht 5\' 6"  (1.676 m)   Wt 225 lb (102.1 kg)   SpO2 97%   BMI 36.32 kg/m     PHYSICAL EXAM  Physical Exam  Constitutional: She is oriented to person, place, and time. She appears well-developed and well-nourished. No distress.  HENT:  Head: Normocephalic and atraumatic.  Mouth/Throat: No oropharyngeal exudate.  Eyes: Pupils are equal, round, and reactive to light. EOM are normal. No scleral icterus.  Neck: Normal range of motion. Neck supple.  Cardiovascular: Normal rate, regular rhythm and normal heart sounds.  No murmur heard. Pulmonary/Chest: No stridor. No respiratory distress. She has no wheezes.  Abdominal: Soft. Bowel sounds are normal.  Musculoskeletal: Normal range of motion. She exhibits no edema.  Neurological: She is alert and oriented to person, place, and time. No cranial nerve deficit.  Skin: Skin is warm. She is not diaphoretic.  Psychiatric: She has a normal mood and affect.         scan   ASSESSMENT/PLAN  62 yo pleasant white female with  Moderate COPD Gold stage A which seems to be controlled with her current regimen of Qvar, in the setting of obesity and deconditioned state with an abnormal finding on CT scan with a right upper lobe nodular opacity status post ENB which was nondiagnostic   #1 shortness of breath likely from her obesity and her deconditioned state  #2 moderate COPD based on pulmonary function testing with FEV1 of 68% Continue Flovent as prescribed Albuterol as needed Avoid secondhand smoke  #3 right upper lobe nodular opacity After further assessment and review of CT scans with the patient, the size of the right upper lobe nodule has unchanged over 1 year But with other nodules present, will need repeat ENB  The Risks and Benefits of  the Bronchoscopy procedure with ENB were explained to patient/family.  I have discussed the risk for Acute Bleeding, increased chance of Infection, increased chance of Respiratory Failure and Cardiac Arrest, Stroke and Death.  I have also explained to avoid all types of NSAIDs 1 week prior to procedure date  to decrease chance of bleeding, and also to avoid food and drinks the midnight prior to procedure.  The procedure consists of a video camera with a light source to be placed and inserted  into the lungs to  look for abnormal tissue and to obtain tissue samples by using needle and biopsy tools.  The patient/family understand the risks and benefits and have agreed to proceed with procedure.   #4 Obesity -recommend significant weight loss -recommend changing  diet  #5 Deconditioned state -Recommend increased daily activity and exercise   #6 right middle lobe nodular opacity and left upper lobe nodular opacity will need to further evaluation with ENB   Patient satisfied with Plan of action and management. All questions answered Follow-up in 3  months   Thierry Dobosz Patricia Pesa, M.D.  [facility]  Medical Director [facility] Director [facility] Cardio-Pulmonary Department

## 2017-11-01 NOTE — Patient Instructions (Signed)
Recommend ENB CASE for RUL nodule

## 2017-11-01 NOTE — Progress Notes (Signed)
[facility] Consultation      Date: 11/01/2017,   MRN# [id number] Andrea Pollard 09/20/1955   AdmissionWeight: 225 lb (102.1 kg)                 CurrentWeight: 225 lb (102.1 kg) Andrea Pollard is a 62 y.o. old female seen in consultation for RUL nodule at the request of Dr. Oleta Mouse.     CHIEF COMPLAINT:   Follow up lung nodules   HISTORY OF PRESENT ILLNESS  62 yo white female seen today for follow up abnormal CT chest/PET scan patient has been dx with ASTHMA in her 60's She has been on albuterol as needed It has been very  well controlled over the past 10 years Former smoker 25 years ago 1 ppd for 10 years    COPD noted on PFT's Ratio 71% predicted FEV1 is 1.73 L 68% predicted Positive bronchodilator response of FEV1 FEF 25/75 is 1.1 L which is 40% predicted  Radiographic history January 2018 CT chest shows right upper lobe nodule 2 x 1.3 cm in size February 2018 CT chest shows right upper lobe nodule 1.8 x 1.2 cm in size May 2018 CT chest shows right upperlobe nodule 1.3 x 1.0 cm in size July CT chest 2018 RUL  Lobe nodule 1.3x1.0 unchanged from previous CT chest +b/l PE was noted  August CT chest 2018 RUL lung nodule 1.3x1.0 cm unchanged from previous scan NOV 2018 CT chest  RUL lung nodule 1.3x1.0 cm unchanged from previous scan CT chest 6.3.19 RUL nodule 1.6 x1.3 CM unchanged for 1 year-  Patient had undergone ENB which was nondiagnostic in Jan 2017 for the RUL nodule  In July 2018, patient dx with PE and DVT placed on [place] for repeat ENB in next 2 weeks  She has no acute issues at this time Resp status back to normal Occasional SOB and wheezing with cough No signs of infection at this time No signs of acute heart failure at this time    Current Outpatient Medications:  .  acetaminophen (TYLENOL) 500 MG tablet, Take 1,000 mg by mouth every 6 (six) hours as needed for mild pain., Disp: , Rfl:  .  albuterol (PROAIR HFA)  108 (90 Base) MCG/ACT inhaler, Inhale 2 Inhalers into the lungs as needed., Disp: , Rfl:  .  atorvastatin (LIPITOR) 80 MG tablet, Take 80 mg by mouth daily., Disp: , Rfl: 2 .  busPIRone (BUSPAR) 5 MG tablet, Take 5 mg by mouth 2 (two) times daily., Disp: , Rfl:  .  citalopram (CELEXA) 10 MG tablet, Take 10 mg by mouth daily., Disp: , Rfl: 2 .  FLOVENT HFA 220 MCG/ACT inhaler, Inhale 2 puffs into the lungs 2 (two) times daily., Disp: , Rfl: 5 .  fluticasone (FLONASE) 50 MCG/ACT nasal spray, Place 2 sprays into both nostrils 1 day or 1 dose., Disp: , Rfl:  .  hydrochlorothiazide (HYDRODIURIL) 12.5 MG tablet, Take 12.5 mg by mouth daily., Disp: , Rfl:  .  metFORMIN (GLUCOPHAGE-XR) 500 MG 24 hr tablet, Take 2 tablets by mouth 2 (two) times daily., Disp: , Rfl: 2 .  Multiple Vitamin (MULTI-VITAMIN DAILY PO), Take 1 mL by mouth 1 day or 1 dose., Disp: , Rfl:  .  omeprazole (PRILOSEC) 20 MG capsule, Take 20 mg by mouth 1 day or 1 dose., Disp: , Rfl:  .  XARELTO 20 MG TABS tablet, TAKE 1 TABLET BY MOUTH ONCE A DAY WITH FOOD, Disp: ,  Rfl: 0    ALLERGIES   Ibuprofen; Levaquin [levofloxacin in d5w]; Sulfamethoxazole-trimethoprim; and Nitroglycerin     REVIEW OF SYSTEMS   Review of Systems  Constitutional: Negative for chills, diaphoresis, fever, malaise/fatigue and weight loss.  HENT: Negative for congestion and hearing loss.   Eyes: Negative for blurred vision and double vision.  Respiratory: Negative for cough, hemoptysis, sputum production, shortness of breath and wheezing.   Cardiovascular: Negative for chest pain, palpitations and orthopnea.  Gastrointestinal: Negative for abdominal pain, heartburn, nausea and vomiting.  Genitourinary: Negative for dysuria and urgency.  Musculoskeletal: Negative for myalgias and neck pain.  Skin: Negative for rash.  Neurological: Negative for dizziness, tingling, tremors, weakness and headaches.  Endo/Heme/Allergies: Does not bruise/bleed easily.    Psychiatric/Behavioral: Negative for depression, substance abuse and suicidal ideas.  All other systems reviewed and are negative.   BP 130/82 (BP Location: Left Arm, Cuff Size: Normal)   Pulse 71   Ht 5\' 6"  (1.676 m)   Wt 225 lb (102.1 kg)   SpO2 97%   BMI 36.32 kg/m     PHYSICAL EXAM  Physical Exam  Constitutional: She is oriented to person, place, and time. She appears well-developed and well-nourished. No distress.  HENT:  Head: Normocephalic and atraumatic.  Mouth/Throat: No oropharyngeal exudate.  Eyes: Pupils are equal, round, and reactive to light. EOM are normal. No scleral icterus.  Neck: Normal range of motion. Neck supple.  Cardiovascular: Normal rate, regular rhythm and normal heart sounds.  No murmur heard. Pulmonary/Chest: No stridor. No respiratory distress. She has no wheezes.  Abdominal: Soft. Bowel sounds are normal.  Musculoskeletal: Normal range of motion. She exhibits no edema.  Neurological: She is alert and oriented to person, place, and time. No cranial nerve deficit.  Skin: Skin is warm. She is not diaphoretic.  Psychiatric: She has a normal mood and affect.         scan   ASSESSMENT/PLAN  62 yo pleasant white female with  Moderate COPD Gold stage A which seems to be controlled with her current regimen of Qvar, in the setting of obesity and deconditioned state with an abnormal finding on CT scan with a right upper lobe nodular opacity status post ENB which was nondiagnostic   #1 shortness of breath likely from her obesity and her deconditioned state  #2 moderate COPD based on pulmonary function testing with FEV1 of 68% Continue Flovent as prescribed Albuterol as needed Avoid secondhand smoke  #3 right upper lobe nodular opacity After further assessment and review of CT scans with the patient, the size of the right upper lobe nodule has unchanged over 1 year But with other nodules present, will need repeat ENB  The Risks and Benefits of  the Bronchoscopy procedure with ENB were explained to patient/family.  I have discussed the risk for Acute Bleeding, increased chance of Infection, increased chance of Respiratory Failure and Cardiac Arrest, Stroke and Death.  I have also explained to avoid all types of NSAIDs 1 week prior to procedure date  to decrease chance of bleeding, and also to avoid food and drinks the midnight prior to procedure.  The procedure consists of a video camera with a light source to be placed and inserted  into the lungs to  look for abnormal tissue and to obtain tissue samples by using needle and biopsy tools.  The patient/family understand the risks and benefits and have agreed to proceed with procedure.   #4 Obesity -recommend significant weight loss -recommend changing  diet  #5 Deconditioned state -Recommend increased daily activity and exercise   #6 right middle lobe nodular opacity and left upper lobe nodular opacity will need to further evaluation with ENB   Patient satisfied with Plan of action and management. All questions answered Follow-up in 3  months   Abiel Antrim Patricia Pesa, M.D.  [facility]  Medical Director [place] Director [facility] Cardio-Pulmonary Department

## 2017-11-01 NOTE — Telephone Encounter (Signed)
PROVIDER: Kasa PROCEDURE: ENB  DATE: 11/07/17 TIME:1pm DX: RUL Nodule   Pt informed of date and times of PAT appt and procedure.

## 2017-11-02 NOTE — Telephone Encounter (Signed)
11/02/17 at 10:10 am EST spoke with Juanda Crumble at [facility]. Procedure code [phone] ENB does not require PA and is a valid and billable code. Call Ref # [id number]. Rhonda J Cobb

## 2017-11-05 ENCOUNTER — Other Ambulatory Visit: Payer: Self-pay

## 2017-11-05 ENCOUNTER — Encounter
Admission: RE | Admit: 2017-11-05 | Discharge: 2017-11-05 | Disposition: A | Discharge status: Home / Self Care | Payer: 59 | Source: Ambulatory Visit | Attending: Internal Medicine | Admitting: Internal Medicine

## 2017-11-05 HISTORY — DX: Major depressive disorder, single episode, unspecified: F32.9

## 2017-11-05 HISTORY — DX: Chronic obstructive pulmonary disease, unspecified: J44.9

## 2017-11-05 HISTORY — DX: Depression, unspecified: F32.A

## 2017-11-05 HISTORY — DX: Acute embolism and thrombosis of unspecified femoral vein: I82.419

## 2017-11-05 HISTORY — DX: Dizziness and giddiness: R42

## 2017-11-05 HISTORY — DX: Personal history of other diseases of the digestive system: Z87.19

## 2017-11-05 HISTORY — DX: Solitary pulmonary nodule: R91.1

## 2017-11-05 HISTORY — DX: Personal history of other specified conditions: Z87.898

## 2017-11-05 HISTORY — DX: Personal history of urinary calculi: Z87.442

## 2017-11-05 HISTORY — DX: Other pulmonary embolism without acute cor pulmonale: I26.99

## 2017-11-05 HISTORY — DX: Anxiety disorder, unspecified: F41.9

## 2017-11-05 NOTE — Patient Instructions (Signed)
Your procedure is scheduled on: Wednesday, November 07, 2017  Report to [place].                    DO NOT STOP ON THE FIRST FLOOR  To find out your arrival time please call [phone] between 1PM - 3PM on Tuesday, June 18TH  Remember: Instructions that are not followed completely may result in serious medical risk,  up to and including death, or upon the discretion of your surgeon and anesthesiologist  your surgery may need to be rescheduled.     _X__ 1. Do not eat food after midnight the night before your procedure.                 No gum chewing or hard candies.NOTHING SOLID IN YOUR MOUTH                    AFTER MIDNIGHT ON Tuesday.                  You may drink clear liquids up to 2 hours Before you are scheduled to arrive for your surgery                            DO not drink clear liquids within 2 hours of the start of your surgery.                  Clear Liquids include:  water, apple juice without pulp, clear carbohydrate                 drink such as Clearfast of Gatorade, Black Coffee or Tea (Do not add                 anything to coffee or tea).  __X__2.  On the morning of surgery brush your teeth with toothpaste and water,                       you may rinse your mouth with mouthwash if you wish.                             Do not swallow any toothpaste of mouthwash.     _X__ 3.  No Alcohol for 24 hours before or after surgery.   _X__ 4.  Do Not Smoke or use e-cigarettes For 24 Hours Prior to Your Surgery.                 Do not use any chewable tobacco products for at least 6 hours prior to                 surgery.  ____  5.  Bring all medications with you on the day of surgery if instructed.   ____  6.  Notify your doctor if there is any change in your medical condition      (cold, fever, infections).     Do not wear jewelry, make-up, hairpins, clips or nail polish. Do not wear lotions, powders, or perfumes.  You may wear deodorant. Do not shave 48 hours prior to surgery. Men may shave face and neck. Do not bring valuables to the hospital.    [facility] is not responsible for any belongings or valuables.  Contacts, dentures or bridgework may not be worn into surgery. Leave your  suitcase in the car. After surgery it may be brought to your room. For patients admitted to the hospital, discharge time is determined by your treatment team.   Patients discharged the day of surgery will not be allowed to drive home.   Please read over the following fact sheets that you were given:   PREPARING FOR SURGERY  ____ Take these medicines the morning of surgery with A SIP OF WATER:    1. FLOVENT INHALER  2. PREVACID (TAKE THE NIGHT BEFORE AND THE MORNING OF SURGERY)  3. CITALOPRAM  4. BUSPAR  5. FLONASE  6.  ____ Fleet Enema (as directed)   ____ Use CHG Soap as directed  __X__ Use inhalers on the day of surgery. PLEASE BRING BOTH INHALERS WITH YOU ON THE MORNING OF SURGERY  _X___ Stop metformin AS OF TODAY   ____ Stop ASPIRIN NOW  ____ Stop Anti-inflammatories NOW   ____ Stop supplements until after surgery.    __X__ Bring C-Pap to the hospital.   CONTINUE TAKING ALL OF YOUR REGULAR MEDICINES BUT DO NOT TAKE          HYDROCHLOROTHIAZIDE ON THE MORNING OF SURGERY.

## 2017-11-07 ENCOUNTER — Ambulatory Visit: Payer: 59

## 2017-11-07 ENCOUNTER — Ambulatory Visit: Payer: 59 | Admitting: Anesthesiology

## 2017-11-07 ENCOUNTER — Encounter
Admission: RE | Disposition: A | Discharge status: Home / Self Care | Payer: Self-pay | Source: Ambulatory Visit | Attending: Internal Medicine

## 2017-11-07 ENCOUNTER — Encounter: Payer: Self-pay | Admitting: Emergency Medicine

## 2017-11-07 ENCOUNTER — Ambulatory Visit
Admission: RE | Admit: 2017-11-07 | Discharge: 2017-11-07 | Disposition: A | Discharge status: Home / Self Care | Payer: 59 | Source: Ambulatory Visit | Attending: Internal Medicine | Admitting: Internal Medicine

## 2017-11-07 DIAGNOSIS — J449 Chronic obstructive pulmonary disease, unspecified: Secondary | ICD-10-CM | POA: Diagnosis not present

## 2017-11-07 DIAGNOSIS — I10 Essential (primary) hypertension: Secondary | ICD-10-CM | POA: Diagnosis not present

## 2017-11-07 DIAGNOSIS — F419 Anxiety disorder, unspecified: Secondary | ICD-10-CM | POA: Insufficient documentation

## 2017-11-07 DIAGNOSIS — I2510 Atherosclerotic heart disease of native coronary artery without angina pectoris: Secondary | ICD-10-CM | POA: Insufficient documentation

## 2017-11-07 DIAGNOSIS — Z7984 Long term (current) use of oral hypoglycemic drugs: Secondary | ICD-10-CM | POA: Diagnosis not present

## 2017-11-07 DIAGNOSIS — G4730 Sleep apnea, unspecified: Secondary | ICD-10-CM | POA: Diagnosis not present

## 2017-11-07 DIAGNOSIS — Z87891 Personal history of nicotine dependence: Secondary | ICD-10-CM | POA: Diagnosis not present

## 2017-11-07 DIAGNOSIS — I739 Peripheral vascular disease, unspecified: Secondary | ICD-10-CM | POA: Diagnosis not present

## 2017-11-07 DIAGNOSIS — F329 Major depressive disorder, single episode, unspecified: Secondary | ICD-10-CM | POA: Diagnosis not present

## 2017-11-07 DIAGNOSIS — E119 Type 2 diabetes mellitus without complications: Secondary | ICD-10-CM | POA: Diagnosis not present

## 2017-11-07 DIAGNOSIS — K219 Gastro-esophageal reflux disease without esophagitis: Secondary | ICD-10-CM | POA: Diagnosis not present

## 2017-11-07 DIAGNOSIS — R911 Solitary pulmonary nodule: Secondary | ICD-10-CM | POA: Insufficient documentation

## 2017-11-07 DIAGNOSIS — Z79899 Other long term (current) drug therapy: Secondary | ICD-10-CM | POA: Insufficient documentation

## 2017-11-07 HISTORY — PX: ELECTROMAGNETIC NAVIGATION BROCHOSCOPY: SHX5369

## 2017-11-07 LAB — GLUCOSE, CAPILLARY
Glucose-Capillary: 126 mg/dL — ABNORMAL HIGH (ref 65–99)
Glucose-Capillary: 103 mg/dL — ABNORMAL HIGH (ref 65–99)

## 2017-11-07 SURGERY — ELECTROMAGNETIC NAVIGATION BRONCHOSCOPY
Anesthesia: General | Laterality: Right

## 2017-11-07 MED ORDER — PROMETHAZINE HCL 25 MG/ML IJ SOLN: 6.2500 mg | INTRAMUSCULAR | Status: DC | PRN

## 2017-11-07 MED ORDER — LIDOCAINE HCL (CARDIAC) PF 100 MG/5ML IV SOSY
PREFILLED_SYRINGE | INTRAVENOUS | Status: DC | PRN
  Administered 2017-11-07: 60 mg via INTRAVENOUS

## 2017-11-07 MED ORDER — SUGAMMADEX SODIUM 200 MG/2ML IV SOLN
INTRAVENOUS | Status: DC | PRN
  Administered 2017-11-07: 220 mg via INTRAVENOUS

## 2017-11-07 MED ORDER — EPHEDRINE SULFATE 50 MG/ML IJ SOLN
INTRAMUSCULAR | Status: DC | PRN
  Administered 2017-11-07: 10 mg via INTRAVENOUS

## 2017-11-07 MED ORDER — IPRATROPIUM-ALBUTEROL 0.5-2.5 (3) MG/3ML IN SOLN
RESPIRATORY_TRACT | Status: AC
  Administered 2017-11-07: 3 mL via RESPIRATORY_TRACT
  Filled 2017-11-07: qty 3

## 2017-11-07 MED ORDER — HYDROCODONE-ACETAMINOPHEN 7.5-325 MG PO TABS: 1.0000 | ORAL_TABLET | Freq: Once | ORAL | Status: DC | PRN

## 2017-11-07 MED ORDER — MIDAZOLAM HCL 2 MG/2ML IJ SOLN
INTRAMUSCULAR | Status: AC
  Filled 2017-11-07: qty 2

## 2017-11-07 MED ORDER — GLYCOPYRROLATE 0.2 MG/ML IJ SOLN
INTRAMUSCULAR | Status: DC | PRN
  Administered 2017-11-07: 0.1 mg via INTRAVENOUS

## 2017-11-07 MED ORDER — MIDAZOLAM HCL 5 MG/5ML IJ SOLN
INTRAMUSCULAR | Status: DC | PRN
  Administered 2017-11-07: 2 mg via INTRAVENOUS

## 2017-11-07 MED ORDER — ONDANSETRON HCL 4 MG/2ML IJ SOLN
INTRAMUSCULAR | Status: AC
  Filled 2017-11-07: qty 2

## 2017-11-07 MED ORDER — DEXAMETHASONE SODIUM PHOSPHATE 10 MG/ML IJ SOLN
INTRAMUSCULAR | Status: DC | PRN
  Administered 2017-11-07: 8 mg via INTRAVENOUS

## 2017-11-07 MED ORDER — SODIUM CHLORIDE 0.9 % IV SOLN
INTRAVENOUS | Status: DC
  Administered 2017-11-07: 12:00:00 via INTRAVENOUS

## 2017-11-07 MED ORDER — FENTANYL CITRATE (PF) 100 MCG/2ML IJ SOLN
INTRAMUSCULAR | Status: DC | PRN
  Administered 2017-11-07 (×2): 50 ug via INTRAVENOUS

## 2017-11-07 MED ORDER — FENTANYL CITRATE (PF) 100 MCG/2ML IJ SOLN: 25.0000 ug | INTRAMUSCULAR | Status: DC | PRN

## 2017-11-07 MED ORDER — SUCCINYLCHOLINE CHLORIDE 20 MG/ML IJ SOLN
INTRAMUSCULAR | Status: AC
Start: 2017-11-07 — End: ?
  Filled 2017-11-07: qty 1

## 2017-11-07 MED ORDER — ROCURONIUM BROMIDE 50 MG/5ML IV SOLN
INTRAVENOUS | Status: AC
  Filled 2017-11-07: qty 1

## 2017-11-07 MED ORDER — ONDANSETRON HCL 4 MG/2ML IJ SOLN
INTRAMUSCULAR | Status: DC | PRN
  Administered 2017-11-07: 4 mg via INTRAVENOUS

## 2017-11-07 MED ORDER — PROPOFOL 10 MG/ML IV BOLUS
INTRAVENOUS | Status: DC | PRN
  Administered 2017-11-07: 200 mg via INTRAVENOUS

## 2017-11-07 MED ORDER — PROPOFOL 10 MG/ML IV BOLUS
INTRAVENOUS | Status: AC
  Filled 2017-11-07: qty 20

## 2017-11-07 MED ORDER — IPRATROPIUM-ALBUTEROL 0.5-2.5 (3) MG/3ML IN SOLN
3.0000 mL | Freq: Once | RESPIRATORY_TRACT | Status: AC
  Administered 2017-11-07: 3 mL via RESPIRATORY_TRACT

## 2017-11-07 MED ORDER — ACETAMINOPHEN 325 MG PO TABS: 325.0000 mg | ORAL_TABLET | ORAL | Status: DC | PRN

## 2017-11-07 MED ORDER — FENTANYL CITRATE (PF) 100 MCG/2ML IJ SOLN
INTRAMUSCULAR | Status: AC
  Filled 2017-11-07: qty 2

## 2017-11-07 MED ORDER — MEPERIDINE HCL 50 MG/ML IJ SOLN: 6.2500 mg | INTRAMUSCULAR | Status: DC | PRN

## 2017-11-07 MED ORDER — ACETAMINOPHEN 160 MG/5ML PO SOLN
325.0000 mg | ORAL | Status: DC | PRN
  Filled 2017-11-07: qty 20.3

## 2017-11-07 MED ORDER — SUCCINYLCHOLINE CHLORIDE 20 MG/ML IJ SOLN
INTRAMUSCULAR | Status: DC | PRN
  Administered 2017-11-07: 100 mg via INTRAVENOUS

## 2017-11-07 MED ORDER — DEXAMETHASONE SODIUM PHOSPHATE 10 MG/ML IJ SOLN
INTRAMUSCULAR | Status: AC
  Filled 2017-11-07: qty 1

## 2017-11-07 MED ORDER — ROCURONIUM BROMIDE 100 MG/10ML IV SOLN
INTRAVENOUS | Status: DC | PRN
  Administered 2017-11-07: 20 mg via INTRAVENOUS

## 2017-11-07 NOTE — Anesthesia Procedure Notes (Signed)
Procedure Name: Intubation Date/Time: 11/07/2017 1:24 PM Performed by: Dionne Bucy, CRNA Pre-anesthesia Checklist: Patient identified, Patient being monitored, Timeout performed, Emergency Drugs available and Suction available Patient Re-evaluated:Patient Re-evaluated prior to induction Oxygen Delivery Method: Circle system utilized Preoxygenation: Pre-oxygenation with 100% oxygen Induction Type: IV induction Ventilation: Mask ventilation with difficulty Laryngoscope Size: 3 and McGraph Grade View: Grade I Tube type: Oral Tube size: 8.5 mm Number of attempts: 1 Airway Equipment and Method: Stylet and Video-laryngoscopy Placement Confirmation: ETT inserted through vocal cords under direct vision,  positive ETCO2 and breath sounds checked- equal and bilateral Secured at: 22 cm Tube secured with: Tape Dental Injury: Teeth and Oropharynx as per pre-operative assessment  Difficulty Due To: Difficulty was anticipated Future Recommendations: Recommend- induction with short-acting agent, and alternative techniques readily available

## 2017-11-07 NOTE — Discharge Instructions (Signed)
AMBULATORY SURGERY  DISCHARGE INSTRUCTIONS   1) The drugs that you were given will stay in your system until tomorrow so for the next 24 hours you should not:  A) Drive an automobile B) Make any legal decisions C) Drink any alcoholic beverage   2) You may resume regular meals tomorrow.  Today it is better to start with liquids and gradually work up to solid foods.  You may eat anything you prefer, but it is better to start with liquids, then soup and crackers, and gradually work up to solid foods.   3) Please notify your doctor immediately if you have any unusual bleeding, trouble breathing, redness and pain at the surgery site, drainage, fever, or pain not relieved by medication.    4) Additional Instructions:        Please contact your physician with any problems or Same Day Surgery at [phone], Monday through Friday 6 am to 4 pm, or [place] at [facility] number at [phone]. Flexible Bronchoscopy, Care After These instructions give you information on caring for yourself after your procedure. Your doctor may also give you more specific instructions. Call your doctor if you have any problems or questions after your procedure. Follow these instructions at home:  Do not eat or drink anything for 2 hours after your procedure. If you try to eat or drink before the medicine wears off, food or drink could go into your lungs. You could also burn yourself.  After 2 hours have passed and when you can cough and gag normally, you may eat soft food and drink liquids slowly.  The day after the test, you may eat your normal diet.  You may do your normal activities.  Keep all doctor visits. Get help right away if:  You get more and more short of breath.  You get light-headed.  You feel like you are going to pass out (faint).  You have chest pain.  You have new problems that worry you.  You cough up more than a little blood.  You cough up more blood than  before. This information is not intended to replace advice given to you by your health care provider. Make sure you discuss any questions you have with your health care provider. Document Released: 03/05/2009 Document Revised: 10/14/2015 Document Reviewed: 01/10/2013 Elsevier Interactive Patient Education  2017 [employer].

## 2017-11-07 NOTE — Transfer of Care (Signed)
Immediate Anesthesia Transfer of Care Note  Patient: Andrea Pollard  Procedure(s) Performed: ELECTROMAGNETIC NAVIGATION BRONCHOSCOPY (Right )  Patient Location: PACU  Anesthesia Type:General  Level of Consciousness: awake and patient cooperative  Airway & Oxygen Therapy: Patient Spontanous Breathing and Patient connected to face mask oxygen  Post-op Assessment: Report given to RN and Post -op Vital signs reviewed and stable  Post vital signs: Reviewed and stable  Last Vitals:  Vitals Value Taken Time  BP 146/78 11/07/2017  2:45 PM  Temp 36.3 C 11/07/2017  2:45 PM  Pulse 102 11/07/2017  2:45 PM  Resp 14 11/07/2017  2:45 PM  SpO2 97 % 11/07/2017  2:45 PM    Last Pain:  Vitals:   11/07/17 1133  TempSrc: Oral  PainSc: 0-No pain         Complications: No apparent anesthesia complications

## 2017-11-07 NOTE — Anesthesia Post-op Follow-up Note (Signed)
Anesthesia QCDR form completed.        

## 2017-11-07 NOTE — Anesthesia Preprocedure Evaluation (Signed)
Anesthesia Evaluation  Patient identified by MRN, date of birth, ID band Patient awake    Reviewed: Allergy & Precautions, H&P , NPO status , reviewed documented beta blocker date and time   History of Anesthesia Complications (+) PONV and history of anesthetic complications  Airway Mallampati: III  TM Distance: >3 FB Neck ROM: full    Dental  (+) Chipped, Missing   Pulmonary asthma , sleep apnea , pneumonia, COPD, former smoker,     + decreased breath sounds      Cardiovascular hypertension, + angina + CAD and + Peripheral Vascular Disease  Normal cardiovascular exam     Neuro/Psych PSYCHIATRIC DISORDERS Anxiety Depression    GI/Hepatic hiatal hernia, GERD  Controlled,(+) Hepatitis -  Endo/Other  diabetes  Renal/GU      Musculoskeletal   Abdominal   Peds  Hematology   Anesthesia Other Findings Past Medical History: No date: Anxiety No date: Asthma No date: Complication of anesthesia No date: COPD (chronic obstructive pulmonary disease) (HCC)     Comment:  diagnosed from pulmonary function tests No date: Depression No date: Diabetes mellitus without complication ([place]) 7106: Dvt femoral (deep venous thrombosis) (HCC)     Comment:  in lung also.  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 had surgery to drain cyst 2019: History of palpitations     Comment:  due to a panic attack No date: Hypertension 2019: Lung nodule     Comment:  being followed by dr. Grayland Ormond, dr Genevive Bi and dr. Mortimer Fries 04/2016: Pneumonia No date: PONV (postoperative nausea and vomiting)     Comment:  NAUSEATED 2018: Pulmonary emboli ([place]) No date: Sleep apnea     Comment:   CPAP No date: Vertigo Past Surgical History: 1994: ABDOMINAL HYSTERECTOMY No date: CESAREAN SECTION 2013: CHOLECYSTECTOMY 2013: CYST REMOVAL NECK     Comment:  was ingrown hair . positive for mrsa at that time prior               to removal in OR 08/15/2016: [place]; N/A     Comment:  Procedure: ELECTROMAGNETIC NAVIGATION BRONCHOSCOPY;                Surgeon: Flora Lipps, MD;  Location: [facility] ORS;  Service:               Cardiopulmonary;  Laterality: N/A; 2015: LAPAROSCOPIC GASTRIC SLEEVE RESECTION     Comment:  lost 75 pounds BMI    Body Mass Index:  37.81 kg/m     Reproductive/Obstetrics                             Anesthesia Physical Anesthesia Plan  ASA: III  Anesthesia Plan: General ETT   Post-op Pain Management:    Induction:   PONV Risk Score and Plan: 3 and Ondansetron, Treatment may vary due to age or medical condition and Midazolam  Airway Management Planned:   Additional Equipment:   Intra-op Plan:   Post-operative Plan:   Informed Consent: I have reviewed the patients History and Physical, chart, labs and discussed the procedure including the risks, benefits and alternatives for the proposed anesthesia with the patient or authorized  representative who has indicated his/her understanding and acceptance.   Dental Advisory Given  Plan Discussed with: CRNA  Anesthesia Plan Comments:         Anesthesia Quick Evaluation

## 2017-11-07 NOTE — Op Note (Signed)
Electromagnetic Navigation Bronchoscopy: Indication: lung nodule  Preoperative Diagnosis:lung nodule Post Procedure Diagnosis:lung nodule Consent: Verbal/Written  The Risks and Benefits of the procedure explained to patient/family prior to start of procedure and I have discussed the risk for acute bleeding, increased chance of infection, increased chance of respiratory failure and cardiac arrest and death.  I have also explained to avoid all types of NSAIDs to decrease chance of bleeding, and to avoid food and drinks the midnight prior to procedure.  The procedure consists of a video camera with a light source to be placed and inserted  into the lungs to  look for abnormal tissue and to obtain tissue samples by using needle and biopsy tools.  The patient/family understand the risks and benefits and have agreed to proceed with procedure.   Hand washing performed prior to starting the procedure.   Type of Anesthesia: see Anesthesiology records .   Procedure Performed:  Virtual Bronchoscopy with Multi-planar Image analysis, 3-D reconstruction of coronal, sagittal and multi-planar images for the purposes of planning real-time bronchoscopy using the iLogic Electromagnetic Navigation Bronchoscopy System (superDimension).  Description of Procedure: After obtaining informed consent from the patient, the above sedative and anesthetic measures were carried out, flexible fiberoptic bronchoscope was inserted via Endotracheal tube after patient was intubated by CNA/Anesthesiologist.   The virtual camera was then placed into the central portion of the trachea. The trachea itself was inspected.  The main carina, right and left midstem bronchus and all the segmental and subsegmental airways by virtual bronchoscopy were inspected. The camera was directed to standard registration points at the following centers: main carina, right upper lobe bronchus, right lower lobe bronchus, right middle lobe bronchus,  left upper lobe bronchus, and the left lower lobe bronchus. This data was transferred to the i-Logic ENB system for real-time bronchoscopy.   The scope was then navigated to the RUL  for tissue sampling  Specimans Obtained:  Transbronchial Fine Needle Aspirations 21G times:5  Transbronchial Forceps Biopsy times:9  Transbronchial Triple Needle Brush:2   Fluoroscopy:  Fluoroscopy was utilized during the course of this procedure to assure that biopsies were taken in a safe manner under fluoroscopic guidance with spot films required.   Complications:None  Estimated Blood Loss: minimal approx 1cc  Monitoring:  The patient was monitored with continuous oximetry and received supplemental nasal cannula oxygen throughout the procedure. In addition, serial blood pressure measurements and continuous electrocardiography showed these physiologic parameters to remain tolerable throughout the procedure.   Assessment and Plan/Additional Comments: Follow up Pathology Reports    Corrin Parker, M.D.  [facility]  Medical Director [place] Director [facility] Cardio-Pulmonary Department

## 2017-11-07 NOTE — Interval H&P Note (Signed)
History and Physical Interval Note:  11/07/2017 12:51 PM  Andrea Pollard  has presented today for surgery, with the diagnosis of RUL NODULE  The various methods of treatment have been discussed with the patient and family. After consideration of risks, benefits and other options for treatment, the patient has consented to  Procedure(s): ELECTROMAGNETIC NAVIGATION BRONCHOSCOPY (Right) as a surgical intervention .  The patient's history has been reviewed, patient examined, no change in status, stable for surgery.  I have reviewed the patient's chart and labs.  Questions were answered to the patient's satisfaction.     Flora Lipps

## 2017-11-08 ENCOUNTER — Telehealth: Payer: Self-pay | Admitting: Internal Medicine

## 2017-11-08 ENCOUNTER — Encounter: Payer: Self-pay | Admitting: Internal Medicine

## 2017-11-08 LAB — CYTOLOGY - NON PAP

## 2017-11-08 LAB — SURGICAL PATHOLOGY

## 2017-11-08 NOTE — Telephone Encounter (Signed)
Pt states she had a bronchoscopy yesterday, and since then she has been coughing a lot, a little SOB, and "shaky". Please call to discuss

## 2017-11-08 NOTE — Anesthesia Postprocedure Evaluation (Signed)
Anesthesia Post Note  Patient: Andrea Pollard  Procedure(s) Performed: ELECTROMAGNETIC NAVIGATION BRONCHOSCOPY (Right )  Patient location during evaluation: PACU Anesthesia Type: General Level of consciousness: awake and alert Pain management: pain level controlled Vital Signs Assessment: post-procedure vital signs reviewed and stable Respiratory status: spontaneous breathing, nonlabored ventilation, respiratory function stable and patient connected to nasal cannula oxygen Cardiovascular status: blood pressure returned to baseline and stable Postop Assessment: no apparent nausea or vomiting Anesthetic complications: no     Last Vitals:  Vitals:   11/07/17 1544 11/07/17 1611  BP: (!) 145/61 118/73  Pulse: 90 95  Resp: 16 16  Temp: 36.6 C   SpO2: 95% 94%    Last Pain:  Vitals:   11/08/17 0939  TempSrc:   PainSc: 0-No pain                 Alphonsus Sias

## 2017-11-09 ENCOUNTER — Other Ambulatory Visit: Payer: Self-pay | Admitting: Internal Medicine

## 2017-11-09 MED ORDER — PREDNISONE 20 MG PO TABS: 40.0000 mg | ORAL_TABLET | Freq: Every day | ORAL | 0 refills | Status: DC

## 2017-11-09 NOTE — Telephone Encounter (Signed)
Patient with cough and chest wall pain Please prescribed Prednisone 40 mg daily for 7 days

## 2017-11-09 NOTE — Telephone Encounter (Signed)
Pt is calling back states she is still in a lot of pain in her sternum, back and shoulders due from coughing. States she "just feels bad, just super tired"

## 2017-11-09 NOTE — Telephone Encounter (Signed)
Patient aware Rx sent.  

## 2017-11-12 NOTE — Progress Notes (Signed)
[place]  [phone] [phone]  ID: Andrea Pollard OB: 10-Aug-1955  MR#: [id number]  QIW#:[id number]  Patient Care Team: Ellamae Sia, MD as PCP - General (Internal Medicine) Minna Merritts, MD as Consulting Physician (Cardiology) Telford Nab, RN as Registered Nurse  CHIEF COMPLAINT: Mass of upper lobe of right lung, DVT/PE.  INTERVAL HISTORY: Patient returns to clinic today for further evaluation and discussion of her biopsy results.  She continues to be anxious, but otherwise feels well. She has no neurologic complaints. She denies any recent fevers or illnesses. She has a good appetite and denies weight loss. She she denies any shortness of breath, cough, chest pain, or hemoptysis. She denies any nausea, vomiting, constipation, or diarrhea. She has no urinary complaints.  Patient feels at her baseline offers no specific complaints today.  REVIEW OF SYSTEMS:   Review of Systems  Constitutional: Negative.  Negative for fever, malaise/fatigue and weight loss.  Respiratory: Negative.  Negative for cough, hemoptysis and shortness of breath.   Cardiovascular: Negative.  Negative for chest pain and leg swelling.  Gastrointestinal: Negative.  Negative for abdominal pain.  Genitourinary: Negative.  Negative for dysuria.  Musculoskeletal: Negative.  Negative for back pain.  Skin: Negative.  Negative for rash.  Neurological: Negative.  Negative for sensory change, focal weakness and weakness.  Psychiatric/Behavioral: The patient is nervous/anxious. The patient does not have insomnia.     As per HPI. Otherwise, a complete review of systems is negative.  PAST MEDICAL HISTORY: Past Medical History:  Diagnosis Date  . Anxiety   . Asthma   . Complication of anesthesia   . COPD (chronic obstructive pulmonary disease) (HCC)    diagnosed from pulmonary function tests  . Depression   . Diabetes mellitus without complication ([place])   . Dvt  femoral (deep venous thrombosis) ([place]) 2018   in lung also.  treated with xarelto for 6 months  . GERD (gastroesophageal reflux disease)   . Hepatitis    AGE 39-HEPATITIS B  . History of hiatal hernia   . History of kidney stones 2019  . History of methicillin resistant staphylococcus aureus (MRSA) 2013   in neck due to ingrown hair. had surgery to drain cyst  . History of palpitations 2019   due to a panic attack  . Hypertension   . Lung nodule 2019   being followed by dr. Grayland Ormond, dr Genevive Bi and dr. Mortimer Fries  . Pneumonia 04/2016  . PONV (postoperative nausea and vomiting)    NAUSEATED  . Pulmonary emboli ([place]) 2018  . Sleep apnea    CPAP  . Vertigo     PAST SURGICAL HISTORY: Past Surgical History:  Procedure Laterality Date  . ABDOMINAL HYSTERECTOMY  1994  . CESAREAN SECTION    . CHOLECYSTECTOMY  2013  . CYST REMOVAL NECK  2013   was ingrown hair . positive for mrsa at that time prior to removal in OR  . ELECTROMAGNETIC NAVIGATION BROCHOSCOPY N/A 08/15/2016   Procedure: ELECTROMAGNETIC NAVIGATION BRONCHOSCOPY;  Surgeon: Flora Lipps, MD;  Location: [facility] ORS;  Service: Cardiopulmonary;  Laterality: N/A;  . ELECTROMAGNETIC NAVIGATION BROCHOSCOPY Right 11/07/2017   Procedure: ELECTROMAGNETIC NAVIGATION BRONCHOSCOPY;  Surgeon: Flora Lipps, MD;  Location: [facility] ORS;  Service: Cardiopulmonary;  Laterality: Right;  . LAPAROSCOPIC GASTRIC SLEEVE RESECTION  2015   lost 75 pounds    FAMILY HISTORY: Family History  Problem Relation Age of Onset  . Stroke Father   . Ovarian cancer Maternal Grandmother   .  Breast cancer Neg Hx     ADVANCED DIRECTIVES (Y/N):  N  HEALTH MAINTENANCE: Social History   Tobacco Use  . Smoking status: Former Smoker    Packs/day: 1.00    Years: 5.00    Pack years: 5.00    Types: Cigarettes    Last attempt to quit: 08/12/1991    Years since quitting: 26.2  . Smokeless tobacco: Never Used  Substance Use Topics  . Alcohol use: No  . Drug use: No      Colonoscopy:  PAP:  Bone density:  Lipid panel:  Allergies  Allergen Reactions  . Ibuprofen Other (See Comments)    Cannot Use due to Gastric SLEEVE SURGERY  . Levaquin [Levofloxacin In D5w] Hives  . Sulfamethoxazole-Trimethoprim Hives  . Nitroglycerin Other (See Comments)    Severe Hypotension    Current Outpatient Medications  Medication Sig Dispense Refill  . acetaminophen (TYLENOL) 500 MG tablet Take 1,000 mg by mouth every 6 (six) hours as needed for mild pain.    Marland Kitchen albuterol (PROAIR HFA) 108 (90 Base) MCG/ACT inhaler Inhale 2 Inhalers into the lungs as needed.    Marland Kitchen atorvastatin (LIPITOR) 80 MG tablet Take 80 mg by mouth daily.  2  . busPIRone (BUSPAR) 5 MG tablet Take 10 mg by mouth 2 (two) times daily.     . citalopram (CELEXA) 10 MG tablet Take 20 mg by mouth daily.   2  . FLOVENT HFA 220 MCG/ACT inhaler Inhale 2 puffs into the lungs 2 (two) times daily.  5  . fluticasone (FLONASE) 50 MCG/ACT nasal spray Place 2 sprays into both nostrils 1 day or 1 dose.    . hydrochlorothiazide (HYDRODIURIL) 12.5 MG tablet Take 12.5 mg by mouth daily.    . lansoprazole (PREVACID) 15 MG capsule Take 15 mg by mouth daily at 12 noon.    . metFORMIN (GLUCOPHAGE-XR) 500 MG 24 hr tablet Take 2 tablets by mouth 2 (two) times daily.  2  . Multiple Vitamin (MULTI-VITAMIN DAILY PO) Take 1 mL by mouth 1 day or 1 dose.    Marland Kitchen omeprazole (PRILOSEC) 20 MG capsule Take 20 mg by mouth 1 day or 1 dose.    . predniSONE (DELTASONE) 10 MG tablet Take 4 tablets (40 mg total) by mouth daily. 28 tablet 0  . XARELTO 20 MG TABS tablet TAKE 1 TABLET BY MOUTH ONCE A DAY WITH FOOD  0   No current facility-administered medications for this visit.     OBJECTIVE: Vitals:   11/14/17 0948  Resp: 16     Body mass index is 35.72 kg/m.    ECOG FS:0 - Asymptomatic  General: Well-developed, well-nourished, no acute distress. Eyes: Pink conjunctiva, anicteric sclera. Lungs: Clear to auscultation  bilaterally. Heart: Regular rate and rhythm. No rubs, murmurs, or gallops. Abdomen: Soft, nontender, nondistended. No organomegaly noted, normoactive bowel sounds. Musculoskeletal: No edema, cyanosis, or clubbing. Neuro: Alert, answering all questions appropriately. Cranial nerves grossly intact. Skin: No rashes or petechiae noted. Psych: Normal affect.  LAB RESULTS:  Lab Results  Component Value Date   NA 139 09/14/2017   K 3.7 09/14/2017   CL 102 09/14/2017   CO2 32 09/14/2017   GLUCOSE 150 (H) 09/14/2017   BUN 19 09/14/2017   CREATININE 0.63 09/14/2017   CALCIUM 8.8 (L) 09/14/2017   PROT 7.8 11/28/2016   ALBUMIN 4.1 11/28/2016   AST 25 11/28/2016   ALT 22 11/28/2016   ALKPHOS 87 11/28/2016   BILITOT 0.7 11/28/2016  GFRNONAA >60 09/14/2017   GFRAA >60 09/14/2017    Lab Results  Component Value Date   WBC 6.8 09/14/2017   NEUTROABS 5.6 05/29/2017   HGB 13.4 09/14/2017   HCT 39.2 09/14/2017   MCV 82.7 09/14/2017   PLT 215 09/14/2017     STUDIES: Ct Chest Wo Contrast  Result Date: 10/22/2017 CLINICAL DATA:  Follow-up pulmonary nodule EXAM: CT CHEST WITHOUT CONTRAST TECHNIQUE: Multidetector CT imaging of the chest was performed following the standard protocol without IV contrast. COMPARISON:  07/11/2017 FINDINGS: Cardiovascular: The heart is normal in size. No pericardial effusion. No evidence of thoracic aortic aneurysm. Atherosclerotic calcifications of the aortic arch. Coronary atherosclerosis of the LAD and right coronary artery. Mediastinum/Nodes: No suspicious mediastinal lymphadenopathy. Visualized right thyroid is notable for a stable 2.3 cm nodule. Lungs/Pleura: 1.6 x 1.3 cm spiculated right upper lobe nodule (series 3/image 61), grossly unchanged from most recent CT, FDG avid on prior PET. Numerous bilateral pulmonary nodules, measuring up to 5 mm, progressive across prior studies and worrisome for metastatic disease. Two dominant nodules which are clearly  progressive include: --5 mm nodule in the superior segment left lower lobe (series 3/image 87) --5 mm nodule in the posterior left lung base (series 3/image 111) No focal consolidation. No pleural effusion or pneumothorax. Upper Abdomen: Visualized upper abdomen is notable for postsurgical changes involving the stomach and prior cholecystectomy. Musculoskeletal: Degenerative changes of the visualized thoracolumbar spine. IMPRESSION: 1.6 x 1.3 cm spiculated right upper lobe nodule, grossly unchanged from recent CT, although FDG avid on prior PET and worrisome for primary bronchogenic neoplasm. Numerous bilateral pulmonary nodules measuring up to 5 mm, progressive across prior studies, worrisome for metastatic disease. Aortic Atherosclerosis (ICD10-I70.0). Electronically Signed   By: Julian Hy M.D.   On: 10/22/2017 11:27   Dg C-arm 1-60 Min-no Report  Result Date: 11/07/2017 Fluoroscopy was utilized by the requesting physician.  No radiographic interpretation.    ASSESSMENT: Mass of upper lobe of right lung, acute PE/left leg DVT.  PLAN:    1. Mass of upper lobe of right lung: CT scan results from October 22, 2017 reviewed independently and reported as above with stable spiculated right upper lobe nodule and mild progression of numerous bilateral pulmonary nodules.  Patient was evaluated by pulmonary and underwent biopsy which again was negative for malignancy.  Despite this, her lesions remain suspicious and will continue to monitor closely with periodic CT scans.  Return to clinic in 3 months with repeat imaging and further evaluation.   2. Bilateral PE/left leg DVT: Diagnosed by CT scan on November 28, 2016. Patient has no obvious transient risk factors. Hypercoagulable workup is negative except for an elevated DRVVT which can be attributed to her anticoagulation. No intervention is needed at this time.    I spent a total of 30 minutes face-to-face with the patient and her daughter who was on the  phone of which greater than 50% of the visit was spent in counseling and coordination of care as summarized above.  Patient expressed understanding and was in agreement with this plan. She also understands that She can call clinic at any time with any questions, concerns, or complaints.   Cancer Staging No matching staging information was found for the patient.  Lloyd Huger, MD   11/18/2017 7:23 AM

## 2017-11-14 ENCOUNTER — Other Ambulatory Visit: Payer: Self-pay

## 2017-11-14 ENCOUNTER — Inpatient Hospital Stay (HOSPITAL_BASED_OUTPATIENT_CLINIC_OR_DEPARTMENT_OTHER): Payer: 59 | Admitting: Oncology

## 2017-11-14 ENCOUNTER — Encounter: Payer: Self-pay | Admitting: Oncology

## 2017-11-14 VITALS — Resp 16 | Ht 66.0 in | Wt 221.3 lb

## 2017-11-14 DIAGNOSIS — R918 Other nonspecific abnormal finding of lung field: Secondary | ICD-10-CM | POA: Diagnosis not present

## 2017-11-14 DIAGNOSIS — I2699 Other pulmonary embolism without acute cor pulmonale: Secondary | ICD-10-CM

## 2017-11-14 DIAGNOSIS — R911 Solitary pulmonary nodule: Secondary | ICD-10-CM

## 2017-11-14 NOTE — Progress Notes (Signed)
Patient here for follow up. She has pain in her right shoulder.

## 2017-11-26 ENCOUNTER — Emergency Department: Payer: 59

## 2017-11-26 ENCOUNTER — Emergency Department
Admission: EM | Admit: 2017-11-26 | Discharge: 2017-11-26 | Disposition: A | Discharge status: Home / Self Care | Payer: 59 | Attending: Emergency Medicine | Admitting: Emergency Medicine

## 2017-11-26 ENCOUNTER — Encounter: Payer: Self-pay | Admitting: Emergency Medicine

## 2017-11-26 DIAGNOSIS — I2510 Atherosclerotic heart disease of native coronary artery without angina pectoris: Secondary | ICD-10-CM | POA: Insufficient documentation

## 2017-11-26 DIAGNOSIS — Z87891 Personal history of nicotine dependence: Secondary | ICD-10-CM | POA: Diagnosis not present

## 2017-11-26 DIAGNOSIS — R079 Chest pain, unspecified: Secondary | ICD-10-CM

## 2017-11-26 DIAGNOSIS — Z9114 Patient's other noncompliance with medication regimen: Secondary | ICD-10-CM | POA: Diagnosis not present

## 2017-11-26 DIAGNOSIS — F419 Anxiety disorder, unspecified: Secondary | ICD-10-CM | POA: Diagnosis not present

## 2017-11-26 DIAGNOSIS — E1165 Type 2 diabetes mellitus with hyperglycemia: Secondary | ICD-10-CM | POA: Diagnosis not present

## 2017-11-26 DIAGNOSIS — J45909 Unspecified asthma, uncomplicated: Secondary | ICD-10-CM | POA: Insufficient documentation

## 2017-11-26 DIAGNOSIS — R739 Hyperglycemia, unspecified: Secondary | ICD-10-CM

## 2017-11-26 DIAGNOSIS — Z91148 Patient's other noncompliance with medication regimen for other reason: Secondary | ICD-10-CM

## 2017-11-26 LAB — TROPONIN I
Troponin I: <0.03 ng/mL (ref <0.03)
Troponin I: <0.03 ng/mL (ref <0.03)

## 2017-11-26 LAB — BASIC METABOLIC PANEL
Anion gap: 9 (ref 5–15)
BUN: 18 mg/dL (ref 6–20)
CHLORIDE: 100 mmol/L (ref 98–111)
CO2: 29 mmol/L (ref 22–32)
CREATININE: 0.69 mg/dL (ref 0.44–1.00)
Calcium: 8.9 mg/dL (ref 8.9–10.3)
GFR calc Af Amer: >60 mL/min (ref >60)
GFR calc non Af Amer: >60 mL/min (ref >60)
Glucose, Bld: 426 mg/dL — ABNORMAL HIGH (ref 70–99)
Potassium: 3.6 mmol/L (ref 3.5–5.1)
Sodium: 138 mmol/L (ref 135–145)

## 2017-11-26 LAB — CBC
HCT: 40.8 % (ref 35.0–47.0)
Hemoglobin: 14.2 g/dL (ref 12.0–16.0)
MCH: 29 pg (ref 26.0–34.0)
MCHC: 34.9 g/dL (ref 32.0–36.0)
MCV: 83.2 fL (ref 80.0–100.0)
Platelets: 188 10*3/uL (ref 150–440)
RBC: 4.9 MIL/uL (ref 3.80–5.20)
RDW: 13.5 % (ref 11.5–14.5)
WBC: 7.2 10*3/uL (ref 3.6–11.0)

## 2017-11-26 LAB — GLUCOSE, CAPILLARY
GLUCOSE-CAPILLARY: 166 mg/dL — AB (ref 70–99)
Glucose-Capillary: 301 mg/dL — ABNORMAL HIGH (ref 70–99)

## 2017-11-26 MED ORDER — SODIUM CHLORIDE 0.9 % IV BOLUS
1000.0000 mL | Freq: Once | INTRAVENOUS | Status: AC
  Administered 2017-11-26: 1000 mL via INTRAVENOUS

## 2017-11-26 MED ORDER — INSULIN ASPART 100 UNIT/ML ~~LOC~~ SOLN
10.0000 [IU] | Freq: Once | SUBCUTANEOUS | Status: AC
  Administered 2017-11-26: 10 [IU] via INTRAVENOUS
  Filled 2017-11-26: qty 1

## 2017-11-26 NOTE — ED Notes (Addendum)
Pt given pillow and helped with repositioning. Pt also given PO fluids at this time. Pt in NAD

## 2017-11-26 NOTE — Discharge Instructions (Signed)
Make sure that you restart your Celexa and take all your medications, including your psychiatric medications, as prescribed.  Please follow-up with your primary care physician.  Please make an appoint with Dr. Ubaldo Glassing, the cardiologist on call, for a repeat stress test as needed and further evaluation for your chest pain.  Return to the emergency department if you develop chest pain, lightheadedness or fainting, shortness of breath, or any other symptoms concerning to you.

## 2017-11-26 NOTE — ED Notes (Signed)
Patient transported to X-ray 

## 2017-11-26 NOTE — ED Provider Notes (Addendum)
[facility] Emergency Department Provider Note  ____________________________________________  Time seen: Approximately 5:06 PM  I have reviewed the triage vital signs and the nursing notes.   HISTORY  Chief Complaint Chest Pain    HPI [name] is a 62 y.o. female a history of HTN, asthma and COPD, DM, anxiety, presenting with chest pain.  The patient states "I was fussing out of family member," when she developed a central sharp chest pain which radiated to her back, lasting several hours but getting progressively better with time.  She had associated "heart racing" but states "every time I feel that, I look at the monitor and my heart rate is the same at 65."  She did not have any shortness of breath.  The thing that bothered her the most was "I could not control it; I could not make it stop."  This episode feels similar to prior anxiety attacks, the patient states she has been chronically stable on BuSpar and Celexa but ran out of her Celexa several weeks ago because her pharmacy was not caring it.  She reports her last stress test was 2 years ago.  She does not have a cardiologist.  At this time, the patient is completely asymptomatic except for some mild self-reported anxiety.   Past Medical History:  Diagnosis Date  . Anxiety   . Asthma   . Complication of anesthesia   . COPD (chronic obstructive pulmonary disease) (HCC)    diagnosed from pulmonary function tests  . Depression   . Diabetes mellitus without complication ([place])   . Dvt femoral (deep venous thrombosis) ([place]) 2018   in lung also.  treated with xarelto for 6 months  . GERD (gastroesophageal reflux disease)   . Hepatitis    AGE 74-HEPATITIS B  . History of hiatal hernia   . History of kidney stones 2019  . History of methicillin resistant staphylococcus aureus (MRSA) 2013   in neck due to ingrown hair. had surgery to drain cyst  . History of palpitations 2019   due to a panic attack   . Hypertension   . Lung nodule 2019   being followed by dr. Grayland Ormond, dr Genevive Bi and dr. Mortimer Fries  . Pneumonia 04/2016  . PONV (postoperative nausea and vomiting)    NAUSEATED  . Pulmonary emboli ([place]) 2018  . Sleep apnea    CPAP  . Vertigo     Patient Active Problem List   Diagnosis Date Noted  . Pulmonary embolism ([place]) 03/04/2017  . Benign hypertension 10/05/2016  . Constipation 10/05/2016  . Gastroesophageal reflux disease 10/05/2016  . Hypercholesterolemia 10/05/2016  . Indigestion 10/05/2016  . Obstructive sleep apnea of adult 10/05/2016  . Type 2 diabetes mellitus ([place]) 10/05/2016  . Vitamin D deficiency 10/05/2016  . Coronary artery disease of native artery of native heart with stable angina pectoris ([place]) 09/27/2016  . Aortic atherosclerosis ([place]) 09/27/2016  . History of smoking 09/27/2016  . Lung nodule 06/07/2016  . Morbid obesity ([place]) 04/23/2014    Past Surgical History:  Procedure Laterality Date  . ABDOMINAL HYSTERECTOMY  1994  . CESAREAN SECTION    . CHOLECYSTECTOMY  2013  . CYST REMOVAL NECK  2013   was ingrown hair . positive for mrsa at that time prior to removal in OR  . ELECTROMAGNETIC NAVIGATION BROCHOSCOPY N/A 08/15/2016   Procedure: ELECTROMAGNETIC NAVIGATION BRONCHOSCOPY;  Surgeon: Flora Lipps, MD;  Location: [facility] ORS;  Service: Cardiopulmonary;  Laterality: N/A;  . ELECTROMAGNETIC NAVIGATION BROCHOSCOPY Right  11/07/2017   Procedure: ELECTROMAGNETIC NAVIGATION BRONCHOSCOPY;  Surgeon: Flora Lipps, MD;  Location: [facility] ORS;  Service: Cardiopulmonary;  Laterality: Right;  . LAPAROSCOPIC GASTRIC SLEEVE RESECTION  2015   lost 75 pounds    Current Outpatient Rx  . Order #: [id number] Class: Historical Med  . Order #: [id number] Class: Historical Med  . Order #: [id number] Class: Historical Med  . Order #: [id number] Class: Historical Med  . Order #: [id number] Class: Historical Med  . Order #: [id number] Class: Historical Med  . Order #: [id number] Class: Historical  Med  . Order #: [id number] Class: Historical Med  . Order #: [id number] Class: Historical Med  . Order #: [id number] Class: Historical Med  . Order #: [id number] Class: Normal    Allergies Ibuprofen; Levaquin [levofloxacin in d5w]; Sulfamethoxazole-trimethoprim; and Nitroglycerin  Family History  Problem Relation Age of Onset  . Stroke Father   . Ovarian cancer Maternal Grandmother   . Breast cancer Neg Hx     Social History Social History   Tobacco Use  . Smoking status: Former Smoker    Packs/day: 1.00    Years: 5.00    Pack years: 5.00    Types: Cigarettes    Last attempt to quit: 08/12/1991    Years since quitting: 26.3  . Smokeless tobacco: Never Used  Substance Use Topics  . Alcohol use: No  . Drug use: No    Review of Systems Constitutional: No fever/chills.  No lightheadedness or syncope. Eyes: No visual changes. ENT: No sore throat. No congestion or rhinorrhea. Cardiovascular: Positive chest pain.  Positive palpitations. Respiratory: Denies shortness of breath.  No cough. Gastrointestinal: No abdominal pain.  No nausea, no vomiting.  No diarrhea.  No constipation. Genitourinary: Negative for dysuria. Musculoskeletal: Negative for back pain.  No new lower extremity swelling or calf pain. Skin: Negative for rash. Neurological: Negative for headaches. No focal numbness, tingling or weakness.  Psychiatric:Positive anxiety.  Positive situational stress. ____________________________________________   PHYSICAL EXAM:  VITAL SIGNS: ED Triage Vitals  Enc Vitals Group     BP 11/26/17 1421 (!) 176/75     Pulse Rate 11/26/17 1421 82     Resp 11/26/17 1421 (!) 21     Temp 11/26/17 1421 97.6 F (36.4 C)     Temp Source 11/26/17 1421 Oral     SpO2 11/26/17 1421 95 %     Weight 11/26/17 1422 221 lb (100.2 kg)     Height 11/26/17 1422 5\' 6"  (1.676 m)     Head Circumference --      Peak Flow --      Pain Score 11/26/17 1422 8     Pain Loc --      Pain Edu? --       Excl. in [place]? --     Constitutional: Alert and oriented. Answers questions appropriately.  Chronically ill-appearing and mildly anxious. Eyes: Conjunctivae are normal.  EOMI. No scleral icterus. Head: Atraumatic. Nose: No congestion/rhinnorhea. Mouth/Throat: Mucous membranes are moist.  Neck: No stridor.  Supple.  No JVD.  No meningismus. Cardiovascular: Normal rate, regular rhythm. No murmurs, rubs or gallops.  Respiratory: Normal respiratory effort.  No accessory muscle use or retractions. Lungs CTAB.  No wheezes, rales or ronchi. Gastrointestinal: Obese.  Soft, nontender and nondistended.  No guarding or rebound.  No peritoneal signs. Musculoskeletal: Bilateral symmetric pitting lower extremity edema to the distal tibial shaft.  On the anterior aspect of both tibias, the patient has a 3 x 3 inch areas of erythema and warmth, without any fluctuance; she  states that these are stable and completely unchanged and that she has had them for years.. No ttp in the calves or palpable cords.  Negative Homan's sign. Neurologic:  A&Ox3.  Speech is clear.  Face and smile are symmetric.  EOMI.  Moves all extremities well. Skin:  Skin is warm, dry and intact. No rash noted. Psychiatric: Mood is normal and affect is anxious. ____________________________________________   LABS (all labs ordered are listed, but only abnormal results are displayed)  Labs Reviewed  BASIC METABOLIC PANEL - Abnormal; Notable for the following components:      Result Value   Glucose, Bld 426 (*)    All other components within normal limits  GLUCOSE, CAPILLARY - Abnormal; Notable for the following components:   Glucose-Capillary 301 (*)    All other components within normal limits  CBC  TROPONIN I  TROPONIN I   ____________________________________________  EKG  ED ECG REPORT I, Eula Listen, the attending physician, personally viewed and interpreted this ECG.   Date: 11/26/2017  EKG Time: 1418  Rate: 77   Rhythm: normal sinus rhythm  Axis: leftward  Intervals:none  ST&T Change: Nonspecific T wave inversion in V1.  No STEMI.  Repeat EKG during an episode of "heart racing":   ED ECG REPORT I, Eula Listen, the attending physician, personally viewed and interpreted this ECG.   Date: 11/26/2017  EKG Time: 1717  Rate: 66  Rhythm: normal sinus rhythm  Axis: leftward  Intervals:none  ST&T Change: No STEMI; nonspecific twave inversion V1 and V2    ____________________________________________  RADIOLOGY  Dg Chest 2 View  Result Date: 11/26/2017 CLINICAL DATA:  Chest pain EXAM: CHEST - 2 VIEW COMPARISON:  Chest radiograph June 03, 2016 and chest CT October 22, 2017 FINDINGS: There is no edema or consolidation. The heart size and pulmonary vascularity are within normal limits. No adenopathy. There is aortic atherosclerosis. There is degenerative change in each shoulder and in the thoracic spine. IMPRESSION: No edema or consolidation. Nodular opacities seen in the lungs on recent chest CT are not appreciable by radiography. No adenopathy evident. There is aortic atherosclerosis. Aortic Atherosclerosis (ICD10-I70.0). Electronically Signed   By: Lowella Grip III M.D.   On: 11/26/2017 14:55    ____________________________________________   PROCEDURES  Procedure(s) performed: None  Procedures  Critical Care performed: No ____________________________________________   INITIAL IMPRESSION / ASSESSMENT AND PLAN / ED COURSE  Pertinent labs & imaging results that were available during my care of the patient were reviewed by me and considered in my medical decision making (see chart for details).  62 y.o. female with a history of asthma and COPD, HTN, DM, and anxiety presenting with chest pain during a stressful situation.  Overall, the patient is hemodynamically stable.  I do not see any evidence of ischemia on her first EKG and we have a second EKG that is pending during an  episode of "heart racing."  Her first troponin is negative and will get a second troponin.  If the patient's work-up in the emergency department is reassuring, she will be discharged home with instructions to follow-up with the cardiologist on call for an outpatient repeat stress test, as well as with her primary care physician.  The patient has already called her prescription into the pharmacy to restart her Celexa.  Today, I do not see any evidence of PE, pneumothorax, aortic pathology or other life threatening cause of chest pain.  ----------------------------------------- 6:54 PM on 11/26/2017 -----------------------------------------  The patient's repeat troponin is  reassuring and her blood sugar has improved from 4 26-1 66.  At this time, the patient is stable for discharge home.  She will follow-up with her primary care physician, restart her psychiatric medications, and follow-up with cardiology for reassessment and possible outpatient stress test as indicated.  Return precautions as well as follow-up instructions were discussed.  ____________________________________________  FINAL CLINICAL IMPRESSION(S) / ED DIAGNOSES  Final diagnoses:  Hyperglycemia  Chest pain, unspecified type  Anxiety  Noncompliance with medications         NEW MEDICATIONS STARTED DURING THIS VISIT:  New Prescriptions   No medications on file      Eula Listen, MD 11/26/17 1739    Eula Listen, MD 11/26/17 [phone]

## 2017-11-26 NOTE — ED Triage Notes (Signed)
PT arrived from work with complaints of midsternum chest pain that started today at work. Pt describes the pain as a sharp stabbing pain. Pt in NAD in triage.

## 2017-12-26 ENCOUNTER — Ambulatory Visit
Admission: RE | Admit: 2017-12-26 | Discharge: 2017-12-26 | Disposition: A | Discharge status: Home / Self Care | Payer: 59 | Source: Ambulatory Visit | Attending: Internal Medicine | Admitting: Internal Medicine

## 2017-12-26 ENCOUNTER — Other Ambulatory Visit: Payer: Self-pay | Admitting: Internal Medicine

## 2017-12-26 DIAGNOSIS — I70202 Unspecified atherosclerosis of native arteries of extremities, left leg: Secondary | ICD-10-CM | POA: Diagnosis not present

## 2017-12-26 DIAGNOSIS — M79675 Pain in left toe(s): Secondary | ICD-10-CM

## 2018-02-10 NOTE — Progress Notes (Signed)
[place]  [phone]) [phone] [phone]) [phone]  ID: [name] OB: 07-02-1955  MR#: [id number]  [facility]#:[id number]  Patient Care Team: Ellamae Sia, MD as PCP - General (Internal Medicine) Minna Merritts, MD as Consulting Physician (Cardiology) Telford Nab, RN as Registered Nurse  CHIEF COMPLAINT: Mass of upper lobe of right lung, DVT/PE.  INTERVAL HISTORY: Patient returns to clinic today for further evaluation and discussion of her imaging results.  She continues to be anxious, but otherwise feels well. She has no neurologic complaints. She denies any recent fevers or illnesses. She has a good appetite and denies weight loss. She she denies any shortness of breath, cough, chest pain, or hemoptysis. She denies any nausea, vomiting, constipation, or diarrhea. She has no urinary complaints.  Patient feels that her baseline offers no specific complaints today.  REVIEW OF SYSTEMS:   Review of Systems  Constitutional: Negative.  Negative for fever, malaise/fatigue and weight loss.  Respiratory: Negative.  Negative for cough, hemoptysis and shortness of breath.   Cardiovascular: Negative.  Negative for chest pain and leg swelling.  Gastrointestinal: Negative.  Negative for abdominal pain.  Genitourinary: Negative.  Negative for dysuria.  Musculoskeletal: Negative.  Negative for back pain.  Skin: Negative.  Negative for rash.  Neurological: Negative.  Negative for sensory change, focal weakness and weakness.  Psychiatric/Behavioral: The patient is nervous/anxious. The patient does not have insomnia.     As per HPI. Otherwise, a complete review of systems is negative.  PAST MEDICAL HISTORY: Past Medical History:  Diagnosis Date  . Anxiety   . Asthma   . Complication of anesthesia   . COPD (chronic obstructive pulmonary disease) (HCC)    diagnosed from pulmonary function tests  . Depression   . Diabetes mellitus without complication ([place])   .  Dvt femoral (deep venous thrombosis) ([place]) 2018   in lung also.  treated with xarelto for 6 months  . GERD (gastroesophageal reflux disease)   . Hepatitis    AGE 62-HEPATITIS B  . History of hiatal hernia   . History of kidney stones 2019  . History of methicillin resistant staphylococcus aureus (MRSA) 2013   in neck due to ingrown hair. had surgery to drain cyst  . History of palpitations 2019   due to a panic attack  . Hypertension   . Lung nodule 2019   being followed by dr. Grayland Ormond, dr Genevive Bi and dr. Mortimer Fries  . Pneumonia 04/2016  . PONV (postoperative nausea and vomiting)    NAUSEATED  . Pulmonary emboli ([place]) 2018  . Sleep apnea    CPAP  . Vertigo     PAST SURGICAL HISTORY: Past Surgical History:  Procedure Laterality Date  . ABDOMINAL HYSTERECTOMY  1994  . CESAREAN SECTION    . CHOLECYSTECTOMY  2013  . CYST REMOVAL NECK  2013   was ingrown hair . positive for mrsa at that time prior to removal in OR  . ELECTROMAGNETIC NAVIGATION BROCHOSCOPY N/A 08/15/2016   Procedure: ELECTROMAGNETIC NAVIGATION BRONCHOSCOPY;  Surgeon: Flora Lipps, MD;  Location: [facility] ORS;  Service: Cardiopulmonary;  Laterality: N/A;  . ELECTROMAGNETIC NAVIGATION BROCHOSCOPY Right 11/07/2017   Procedure: ELECTROMAGNETIC NAVIGATION BRONCHOSCOPY;  Surgeon: Flora Lipps, MD;  Location: [facility] ORS;  Service: Cardiopulmonary;  Laterality: Right;  . LAPAROSCOPIC GASTRIC SLEEVE RESECTION  2015   lost 75 pounds    FAMILY HISTORY: Family History  Problem Relation Age of Onset  . Stroke Father   . Ovarian cancer Maternal Grandmother   .  Breast cancer Neg Hx     ADVANCED DIRECTIVES (Y/N):  N  HEALTH MAINTENANCE: Social History   Tobacco Use  . Smoking status: Former Smoker    Packs/day: 1.00    Years: 5.00    Pack years: 5.00    Types: Cigarettes    Last attempt to quit: 08/12/1991    Years since quitting: 26.5  . Smokeless tobacco: Never Used  Substance Use Topics  . Alcohol use: No  . Drug use: No      Colonoscopy:  PAP:  Bone density:  Lipid panel:  Allergies  Allergen Reactions  . Ibuprofen Other (See Comments)    Cannot Use due to Gastric SLEEVE SURGERY  . Levaquin [Levofloxacin In D5w] Hives  . Sulfamethoxazole-Trimethoprim Hives  . Nitroglycerin Other (See Comments)    Severe Hypotension    Current Outpatient Medications  Medication Sig Dispense Refill  . acetaminophen (TYLENOL) 500 MG tablet Take 1,000 mg by mouth every 6 (six) hours as needed for mild pain.    Marland Kitchen albuterol (PROAIR HFA) 108 (90 Base) MCG/ACT inhaler Inhale 2 Inhalers into the lungs as needed for wheezing or shortness of breath.     . ALPRAZolam (XANAX) 0.25 MG tablet Take 0.25 mg by mouth daily as needed for anxiety.    Marland Kitchen atorvastatin (LIPITOR) 80 MG tablet Take 80 mg by mouth daily.  2  . busPIRone (BUSPAR) 5 MG tablet Take 10 mg by mouth 2 (two) times daily.     . citalopram (CELEXA) 20 MG tablet Take 20 mg by mouth daily.   2  . diclofenac sodium (VOLTAREN) 1 % GEL Apply 4 g topically 4 (four) times daily.  2  . FLOVENT HFA 220 MCG/ACT inhaler Inhale 2 puffs into the lungs 2 (two) times daily.  5  . fluticasone (FLONASE) 50 MCG/ACT nasal spray Place 2 sprays into both nostrils daily.     . lansoprazole (PREVACID) 15 MG capsule Take 15 mg by mouth at bedtime.     Marland Kitchen lisinopril (PRINIVIL,ZESTRIL) 5 MG tablet Take 5 mg by mouth daily.  3  . metFORMIN (GLUCOPHAGE-XR) 500 MG 24 hr tablet Take 2 tablets by mouth 2 (two) times daily.  2  . sertraline (ZOLOFT) 100 MG tablet Take 100 mg by mouth daily.  3  . buPROPion (WELLBUTRIN) 75 MG tablet Take 75 mg by mouth daily.    . hydrochlorothiazide (HYDRODIURIL) 12.5 MG tablet Take 12.5 mg by mouth daily.    . predniSONE (DELTASONE) 10 MG tablet Take 4 tablets (40 mg total) by mouth daily. (Patient not taking: Reported on 11/26/2017) 28 tablet 0   No current facility-administered medications for this visit.     OBJECTIVE: Vitals:   02/12/18 1407  BP: (!)  149/88  Pulse: 92  Resp: 18  Temp: 98.6 F (37 C)  SpO2: 96%     Body mass index is 36.49 kg/m.    ECOG FS:0 - Asymptomatic  General: Well-developed, well-nourished, no acute distress. Eyes: Pink conjunctiva, anicteric sclera. HEENT: Normocephalic, moist mucous membranes. Lungs: Clear to auscultation bilaterally. Heart: Regular rate and rhythm. No rubs, murmurs, or gallops. Abdomen: Soft, nontender, nondistended. No organomegaly noted, normoactive bowel sounds. Musculoskeletal: No edema, cyanosis, or clubbing. Neuro: Alert, answering all questions appropriately. Cranial nerves grossly intact. Skin: No rashes or petechiae noted. Psych: Normal affect.  LAB RESULTS:  Lab Results  Component Value Date   NA 138 11/26/2017   K 3.6 11/26/2017   CL 100 11/26/2017   CO2 29  11/26/2017   GLUCOSE 426 (H) 11/26/2017   BUN 18 11/26/2017   CREATININE 0.50 02/12/2018   CALCIUM 8.9 11/26/2017   PROT 7.8 11/28/2016   ALBUMIN 4.1 11/28/2016   AST 25 11/28/2016   ALT 22 11/28/2016   ALKPHOS 87 11/28/2016   BILITOT 0.7 11/28/2016   GFRNONAA >60 11/26/2017   GFRAA >60 11/26/2017    Lab Results  Component Value Date   WBC 7.2 11/26/2017   NEUTROABS 5.6 05/29/2017   HGB 14.2 11/26/2017   HCT 40.8 11/26/2017   MCV 83.2 11/26/2017   PLT 188 11/26/2017     STUDIES: Ct Chest W Contrast  Result Date: 02/12/2018 CLINICAL DATA:  Right lung mass EXAM: CT CHEST WITH CONTRAST TECHNIQUE: Multidetector CT imaging of the chest was performed during intravenous contrast administration. CONTRAST:  57mL ISOVUE-300 IOPAMIDOL (ISOVUE-300) INJECTION 61% COMPARISON:  CT chest dated 10/22/2017 FINDINGS: Cardiovascular: The heart is normal in size. No pericardial effusion. No evidence of thoracic aortic aneurysm. Atherosclerotic calcifications of the aortic arch. Coronary atherosclerosis of the LAD. Mediastinum/Nodes: Small mediastinal lymph nodes which do not meet pathologic CT size criteria. Visualized  thyroid is notable for a stable 2.2 cm right thyroid nodule. Lungs/Pleura: 12 x 15 mm spiculated central right upper lobe nodule (series 3/image 66), previously 13 x 16 mm. Platelike scarring/atelectasis in the inferior right upper lobe (series 3/image 94), possibly reflecting radiation changes. Numerous scattered small bilateral pulmonary nodules, measuring up to 4-5 mm, grossly unchanged. No focal consolidation. No pleural effusion or pneumothorax. Upper Abdomen: Visualized upper abdomen is notable for per surgical changes related to prior gastric surgery, prior cholecystectomy, and a small hiatal hernia. Musculoskeletal: Degenerative changes of the visualized thoracolumbar spine. IMPRESSION: 12 x 15 mm spiculated right upper lobe nodule,, mildly decreased. Scattered small bilateral pulmonary nodules, measuring up to 4-5 mm, grossly unchanged. No evidence of new/progressive disease. Aortic Atherosclerosis (ICD10-I70.0). Electronically Signed   By: Julian Hy M.D.   On: 02/12/2018 14:47    ASSESSMENT: Mass of upper lobe of right lung, acute PE/left leg DVT.  PLAN:    1. Mass of upper lobe of right lung: Patient underwent biopsy on November 07, 2017 that was negative for malignancy.  Despite the negative biopsy, there was still concern for malignancy.  CT scan results from February 12, 2018 reviewed independently and report as above with a mild decrease in size of right upper lobe nodule.  We will continue to monitor lesion closely.  No intervention or repeat biopsy is needed at this time.  Return to clinic in 6 months with repeat imaging and further evaluation.  Patient requests her imaging and evaluation occur on the same day given her increased anxiety.   2. Bilateral PE/left leg DVT: Patient is now completed anticoagulation.  Diagnosed by CT scan on November 28, 2016. Patient has no obvious transient risk factors. Hypercoagulable workup was negative except for an elevated DRVVT which can be attributed to  the anticoagulation she is taking at that time.  I spent a total of 20 minutes face-to-face with the patient of which greater than 50% of the visit was spent in counseling and coordination of care as detailed above.   Patient expressed understanding and was in agreement with this plan. She also understands that She can call clinic at any time with any questions, concerns, or complaints.   Cancer Staging No matching staging information was found for the patient.  Lloyd Huger, MD   02/16/2018 7:00 AM

## 2018-02-12 ENCOUNTER — Encounter: Payer: Self-pay | Admitting: Oncology

## 2018-02-12 ENCOUNTER — Inpatient Hospital Stay: Payer: 59 | Attending: Oncology | Admitting: Oncology

## 2018-02-12 ENCOUNTER — Ambulatory Visit
Admission: RE | Admit: 2018-02-12 | Discharge: 2018-02-12 | Disposition: A | Discharge status: Home / Self Care | Payer: 59 | Source: Ambulatory Visit | Attending: Oncology | Admitting: Oncology

## 2018-02-12 ENCOUNTER — Other Ambulatory Visit: Payer: Self-pay

## 2018-02-12 ENCOUNTER — Ambulatory Visit: Payer: 59 | Admitting: Oncology

## 2018-02-12 VITALS — BP 149/88 | HR 92 | Temp 98.6°F | Resp 18 | Wt 226.1 lb

## 2018-02-12 DIAGNOSIS — R911 Solitary pulmonary nodule: Secondary | ICD-10-CM | POA: Insufficient documentation

## 2018-02-12 DIAGNOSIS — J449 Chronic obstructive pulmonary disease, unspecified: Secondary | ICD-10-CM | POA: Diagnosis not present

## 2018-02-12 DIAGNOSIS — Z87891 Personal history of nicotine dependence: Secondary | ICD-10-CM | POA: Diagnosis not present

## 2018-02-12 DIAGNOSIS — R918 Other nonspecific abnormal finding of lung field: Secondary | ICD-10-CM | POA: Diagnosis not present

## 2018-02-12 DIAGNOSIS — I700 Atherosclerosis of aorta: Secondary | ICD-10-CM | POA: Diagnosis not present

## 2018-02-12 DIAGNOSIS — I2699 Other pulmonary embolism without acute cor pulmonale: Secondary | ICD-10-CM | POA: Insufficient documentation

## 2018-02-12 DIAGNOSIS — G4730 Sleep apnea, unspecified: Secondary | ICD-10-CM | POA: Diagnosis not present

## 2018-02-12 DIAGNOSIS — E119 Type 2 diabetes mellitus without complications: Secondary | ICD-10-CM | POA: Insufficient documentation

## 2018-02-12 DIAGNOSIS — I10 Essential (primary) hypertension: Secondary | ICD-10-CM | POA: Insufficient documentation

## 2018-02-12 DIAGNOSIS — F419 Anxiety disorder, unspecified: Secondary | ICD-10-CM | POA: Diagnosis not present

## 2018-02-12 DIAGNOSIS — K219 Gastro-esophageal reflux disease without esophagitis: Secondary | ICD-10-CM | POA: Insufficient documentation

## 2018-02-12 LAB — POCT I-STAT CREATININE: Creatinine, Ser: 0.5 mg/dL (ref 0.44–1.00)

## 2018-02-12 MED ORDER — IOPAMIDOL (ISOVUE-300) INJECTION 61%
75.0000 mL | Freq: Once | INTRAVENOUS | Status: AC | PRN
  Administered 2018-02-12: 75 mL via INTRAVENOUS

## 2018-02-12 NOTE — Progress Notes (Signed)
Patient here for follow up. No concerns voiced.  °

## 2018-02-26 ENCOUNTER — Ambulatory Visit: Payer: 59 | Admitting: Internal Medicine

## 2018-02-27 ENCOUNTER — Encounter: Payer: Self-pay | Admitting: Internal Medicine

## 2018-04-04 ENCOUNTER — Ambulatory Visit (INDEPENDENT_AMBULATORY_CARE_PROVIDER_SITE_OTHER): Payer: 59 | Admitting: Internal Medicine

## 2018-04-04 ENCOUNTER — Encounter: Payer: Self-pay | Admitting: Internal Medicine

## 2018-04-04 VITALS — BP 140/90 | HR 74 | Ht 66.0 in | Wt 230.4 lb

## 2018-04-04 DIAGNOSIS — J4520 Mild intermittent asthma, uncomplicated: Secondary | ICD-10-CM

## 2018-04-04 NOTE — Progress Notes (Signed)
[facility] Consultation      Date: 04/04/2018,   MRN# [id number] HANNI MILFORD 1955-07-27   AdmissionWeight: 230 lb 6.4 oz (104.5 kg)                 CurrentWeight: 230 lb 6.4 oz (104.5 kg) OPHA MCGHEE is a 62 y.o. old female seen in consultation for RUL nodule at the request of Dr. Grayland Ormond  Synopsis 62 year old pleasant white female seen for asthma diagnosed in her 75s CT chest shows right upper lobe nodular density Has been followed for the last several years Patient has had 2 ENB cases which were negative for malignancy  Former smoker 25 years ago 1 pack a day for 10 years  PFT's Ratio 71% predicted FEV1 is 1.73 L 68% predicted Positive bronchodilator response of FEV1 FEF 25/75 is 1.1 L which is 40% predicted   Radiographic history January 2018 CT chest shows right upper lobe nodule 2 x 1.3 cm in size February 2018 CT chest shows right upper lobe nodule 1.8 x 1.2 cm in size May 2018 CT chest shows right upperlobe nodule 1.3 x 1.0 cm in size July CT chest 2018 RUL  Lobe nodule 1.3x1.0 unchanged from previous CT chest +b/l PE was noted  August CT chest 2018 RUL lung nodule 1.3x1.0 cm unchanged from previous scan NOV 2018 CT chest  RUL lung nodule 1.3x1.0 cm unchanged from previous scan CT chest 6.3.19 RUL nodule 1.6 x1.3 CM unchanged for 1 year-  Patient had undergone ENB which was nondiagnostic in Jan 2017 for the RUL nodule  In July 2018, patient dx with PE and DVT placed on Xaralto     CHIEF COMPLAINT:   Follow up RUL lung nodule Follow up [place]  Follow-up CT chest shows persistent right upper lobe nodular density however has decreased in size over the last CT scan  Patient has chronic shortness of breath Previously diagnosed with asthma in her 30s No sign of infection at this time  no signs of exacerbation at this time  Patient has well-controlled COPD asthma on her inhalers  CT scan reviewed  with patient from 02/08/2018 which shows that the right upper lobe nodular density has decreased in size From previous exam nation and CT scan      Current Outpatient Medications:  .  albuterol (PROAIR HFA) 108 (90 Base) MCG/ACT inhaler, Inhale 2 Inhalers into the lungs as needed for wheezing or shortness of breath. , Disp: , Rfl:  .  ALPRAZolam (XANAX) 0.25 MG tablet, Take 0.25 mg by mouth daily as needed for anxiety., Disp: , Rfl:  .  atorvastatin (LIPITOR) 80 MG tablet, Take 80 mg by mouth daily., Disp: , Rfl: 2 .  buPROPion (WELLBUTRIN) 75 MG tablet, Take 75 mg by mouth daily., Disp: , Rfl:  .  busPIRone (BUSPAR) 5 MG tablet, Take 10 mg by mouth 2 (two) times daily. , Disp: , Rfl:  .  citalopram (CELEXA) 20 MG tablet, Take 20 mg by mouth daily. , Disp: , Rfl: 2 .  FLOVENT HFA 220 MCG/ACT inhaler, Inhale 2 puffs into the lungs 2 (two) times daily., Disp: , Rfl: 5 .  lansoprazole (PREVACID) 15 MG capsule, Take 15 mg by mouth at bedtime. , Disp: , Rfl:  .  lisinopril (PRINIVIL,ZESTRIL) 5 MG tablet, Take 5 mg by mouth daily., Disp: , Rfl: 3 .  metFORMIN (GLUCOPHAGE-XR) 500 MG 24 hr tablet, Take 2 tablets by mouth 2 (two)  times daily., Disp: , Rfl: 2 .  predniSONE (DELTASONE) 10 MG tablet, Take 4 tablets (40 mg total) by mouth daily., Disp: 28 tablet, Rfl: 0 .  sertraline (ZOLOFT) 100 MG tablet, Take 100 mg by mouth daily., Disp: , Rfl: 3    ALLERGIES   Ibuprofen; Levaquin [levofloxacin in d5w]; Sulfamethoxazole-trimethoprim; and Nitroglycerin     REVIEW OF SYSTEMS   Review of Systems  Constitutional: Negative for chills, diaphoresis, fever, malaise/fatigue and weight loss.  HENT: Negative for congestion and hearing loss.   Eyes: Negative for blurred vision and double vision.  Respiratory: Positive for shortness of breath. Negative for cough, hemoptysis, sputum production and wheezing.   Cardiovascular: Negative for chest pain, palpitations and orthopnea.  Gastrointestinal:  Negative for abdominal pain, heartburn, nausea and vomiting.  Genitourinary: Negative for dysuria and urgency.  Musculoskeletal: Negative for myalgias and neck pain.  Skin: Negative for rash.  Neurological: Negative for dizziness, tingling, tremors, weakness and headaches.  Endo/Heme/Allergies: Does not bruise/bleed easily.  Psychiatric/Behavioral: Negative for depression, substance abuse and suicidal ideas.  All other systems reviewed and are negative.   BP 140/90 (BP Location: Left Arm, Cuff Size: Normal)   Pulse 74   Ht 5\' 6"  (1.676 m)   Wt 230 lb 6.4 oz (104.5 kg)   SpO2 97%   BMI 37.19 kg/m     PHYSICAL EXAM  Physical Exam  Constitutional: She is oriented to person, place, and time. She appears well-developed and well-nourished. No distress.  HENT:  Head: Normocephalic and atraumatic.  Mouth/Throat: No oropharyngeal exudate.  Eyes: Pupils are equal, round, and reactive to light. EOM are normal. No scleral icterus.  Neck: Normal range of motion. Neck supple.  Cardiovascular: Normal rate, regular rhythm and normal heart sounds.  No murmur heard. Pulmonary/Chest: No stridor. No respiratory distress. She has no wheezes.  Abdominal: Soft. Bowel sounds are normal.  Musculoskeletal: Normal range of motion. She exhibits no edema.  Neurological: She is alert and oriented to person, place, and time. No cranial nerve deficit.  Skin: Skin is warm. She is not diaphoretic.  Psychiatric: She has a normal mood and affect.         scan   ASSESSMENT/PLAN  62 year old pleasant white female with moderate COPD Gold stage a which seems to be well-controlled at this time on her current regimen in the setting of morbid obesity and deconditioned state with abnormal CT scan finding of right upper lobe nodular density and opacification has underwent several ENB navigation cases which were nondiagnostic, follow-up CT scans showed no significant growth over the last 2 years   #1 moderate COPD  based on pulmonary function testing FEV1 68% predicted Continue inhalers as prescribed  avoid secondhand Smoke albuterol as needed    #2 right upper lobe pulmonary  follow-up CT scan in 6 months needed After further assessment and review of CT scans with the patient the size of the right upper lobe nodule has not significantly changed over the last year and a half   #3 obesity -recommend significant weight loss -recommend changing diet  #4 deconditioned state -Recommend increased daily activity and exercise     Patient  satisfied with Plan of action and management. All questions answered Follow-up in 6 months with repeat CT chest  Brin Ruggerio Patricia Pesa, M.D.  [facility]  Medical Director [place] Director [facility] Cardio-P[facility] Department

## 2018-04-04 NOTE — Patient Instructions (Signed)
Follow up CT chest in 6 months  Continue inhalers as prescribed

## 2018-04-08 ENCOUNTER — Encounter: Payer: Self-pay | Admitting: Emergency Medicine

## 2018-04-08 ENCOUNTER — Emergency Department: Payer: 59

## 2018-04-08 ENCOUNTER — Emergency Department
Admission: EM | Admit: 2018-04-08 | Discharge: 2018-04-08 | Disposition: A | Discharge status: Home / Self Care | Payer: 59 | Attending: Emergency Medicine | Admitting: Emergency Medicine

## 2018-04-08 DIAGNOSIS — E119 Type 2 diabetes mellitus without complications: Secondary | ICD-10-CM | POA: Insufficient documentation

## 2018-04-08 DIAGNOSIS — I2510 Atherosclerotic heart disease of native coronary artery without angina pectoris: Secondary | ICD-10-CM | POA: Diagnosis not present

## 2018-04-08 DIAGNOSIS — R1031 Right lower quadrant pain: Secondary | ICD-10-CM | POA: Insufficient documentation

## 2018-04-08 DIAGNOSIS — J449 Chronic obstructive pulmonary disease, unspecified: Secondary | ICD-10-CM | POA: Diagnosis not present

## 2018-04-08 DIAGNOSIS — I10 Essential (primary) hypertension: Secondary | ICD-10-CM | POA: Insufficient documentation

## 2018-04-08 DIAGNOSIS — R110 Nausea: Secondary | ICD-10-CM

## 2018-04-08 DIAGNOSIS — Z87891 Personal history of nicotine dependence: Secondary | ICD-10-CM | POA: Insufficient documentation

## 2018-04-08 DIAGNOSIS — R109 Unspecified abdominal pain: Secondary | ICD-10-CM | POA: Diagnosis present

## 2018-04-08 LAB — COMPREHENSIVE METABOLIC PANEL
ALBUMIN: 4 g/dL (ref 3.5–5.0)
ALK PHOS: 93 U/L (ref 38–126)
ALT: 21 U/L (ref 0–44)
ANION GAP: 8 (ref 5–15)
AST: 23 U/L (ref 15–41)
BUN: 15 mg/dL (ref 6–20)
CALCIUM: 8.7 mg/dL — AB (ref 8.9–10.3)
CO2: 29 mmol/L (ref 22–32)
Chloride: 105 mmol/L (ref 98–111)
Creatinine, Ser: 0.75 mg/dL (ref 0.44–1.00)
GFR calc Af Amer: >60 mL/min (ref >60)
GLUCOSE: 176 mg/dL — AB (ref 70–99)
Potassium: 3.6 mmol/L (ref 3.5–5.1)
Sodium: 142 mmol/L (ref 135–145)
TOTAL PROTEIN: 7 g/dL (ref 6.5–8.1)
Total Bilirubin: 0.6 mg/dL (ref 0.3–1.2)

## 2018-04-08 LAB — CBC
HCT: 43 % (ref 36.0–46.0)
Hemoglobin: 14 g/dL (ref 12.0–15.0)
MCH: 27.3 pg (ref 26.0–34.0)
MCHC: 32.6 g/dL (ref 30.0–36.0)
MCV: 84 fL (ref 80.0–100.0)
PLATELETS: 203 10*3/uL (ref 150–400)
RBC: 5.12 MIL/uL — ABNORMAL HIGH (ref 3.87–5.11)
RDW: 12 % (ref 11.5–15.5)
WBC: 7 10*3/uL (ref 4.0–10.5)
nRBC: 0 % (ref 0.0–0.2)

## 2018-04-08 LAB — URINALYSIS, ROUTINE W REFLEX MICROSCOPIC
BILIRUBIN URINE: NEGATIVE
Glucose, UA: NEGATIVE mg/dL
HGB URINE DIPSTICK: NEGATIVE
Ketones, ur: NEGATIVE mg/dL
Leukocytes, UA: NEGATIVE
Nitrite: NEGATIVE
PROTEIN: NEGATIVE mg/dL
Specific Gravity, Urine: 1.025 (ref 1.005–1.030)
pH: 5 (ref 5.0–8.0)

## 2018-04-08 MED ORDER — FENTANYL CITRATE (PF) 100 MCG/2ML IJ SOLN
75.0000 ug | Freq: Once | INTRAMUSCULAR | Status: AC
  Administered 2018-04-08: 75 ug via INTRAVENOUS
  Filled 2018-04-08: qty 2

## 2018-04-08 MED ORDER — ACETAMINOPHEN 500 MG PO TABS
1000.0000 mg | ORAL_TABLET | Freq: Once | ORAL | Status: AC
  Administered 2018-04-08: 1000 mg via ORAL
  Filled 2018-04-08: qty 2

## 2018-04-08 MED ORDER — SODIUM CHLORIDE 0.9 % IV BOLUS
1000.0000 mL | Freq: Once | INTRAVENOUS | Status: AC
  Administered 2018-04-08: 1000 mL via INTRAVENOUS

## 2018-04-08 MED ORDER — ONDANSETRON HCL 4 MG/2ML IJ SOLN
4.0000 mg | Freq: Once | INTRAMUSCULAR | Status: AC
  Administered 2018-04-08: 4 mg via INTRAVENOUS
  Filled 2018-04-08: qty 2

## 2018-04-08 NOTE — ED Notes (Signed)
2 attempts at peripheral blood draw unsuccessful; pt tolerated well

## 2018-04-08 NOTE — ED Provider Notes (Signed)
[facility] Emergency Department Provider Note  ____________________________________________  Time seen: Approximately 7:33 AM  I have reviewed the triage vital signs and the nursing notes.   HISTORY  Chief Complaint Back Pain    HPI Andrea Pollard is a 62 y.o. female a history of asthma and COPD, prior history of renal colic, currently under treatment for UTI, presenting with right flank and right lower quadrant pain.  The patient reports that she was started on ciprofloxacin for UTI that was likely precipitated by a diarrheal illness that occurred 2 weeks ago, and reports that her culture sensitivities were not back but that she grew E. coli.  At 10:30 PM last night, the patient woke up with a sharp right flank pain that radiated to the right lower quadrant.  The pain is not positional and she has not had any recent trauma or strain.  She has not had dysuria but reports urinary frequency without hematuria.  She has had nausea without vomiting today.  No fevers or chills.  She has not tried anything for pain.  Past Medical History:  Diagnosis Date  . Anxiety   . Asthma   . Complication of anesthesia   . COPD (chronic obstructive pulmonary disease) (HCC)    diagnosed from pulmonary function tests  . Depression   . Diabetes mellitus without complication ([place])   . Dvt femoral (deep venous thrombosis) ([place]) 2018   in lung also.  treated with xarelto for 6 months  . GERD (gastroesophageal reflux disease)   . 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 History of hiatal hernia   . History of kidney stones 2019  . History of methicillin resistant staphylococcus aureus (MRSA) 2013   in neck due to ingrown hair. had surgery to drain cyst  . History of palpitations 2019   due to a panic attack  . Hypertension   . Lung nodule 2019   being followed by dr. Grayland Ormond, dr Genevive Bi and dr. Mortimer Fries  . Pneumonia 04/2016  . PONV (postoperative nausea and vomiting)    NAUSEATED  .  Pulmonary emboli ([place]) 2018  . Sleep apnea    CPAP  . Vertigo     Patient Active Problem List   Diagnosis Date Noted  . Pulmonary embolism ([place]) 03/04/2017  . Benign hypertension 10/05/2016  . Constipation 10/05/2016  . Gastroesophageal reflux disease 10/05/2016  . Hypercholesterolemia 10/05/2016  . Indigestion 10/05/2016  . Obstructive sleep apnea of adult 10/05/2016  . Type 2 diabetes mellitus ([place]) 10/05/2016  . Vitamin D deficiency 10/05/2016  . Coronary artery disease of native artery of native heart with stable angina pectoris ([place]) 09/27/2016  . Aortic atherosclerosis ([place]) 09/27/2016  . History of smoking 09/27/2016  . Lung nodule 06/07/2016  . Morbid obesity ([place]) 04/23/2014    Past Surgical History:  Procedure Laterality Date  . ABDOMINAL HYSTERECTOMY  1994  . CESAREAN SECTION    . CHOLECYSTECTOMY  2013  . CYST REMOVAL NECK  2013   was ingrown hair . positive for mrsa at that time prior to removal in OR  . ELECTROMAGNETIC NAVIGATION BROCHOSCOPY N/A 08/15/2016   Procedure: ELECTROMAGNETIC NAVIGATION BRONCHOSCOPY;  Surgeon: Flora Lipps, MD;  Location: [facility] ORS;  Service: Cardiopulmonary;  Laterality: N/A;  . ELECTROMAGNETIC NAVIGATION BROCHOSCOPY Right 11/07/2017   Procedure: ELECTROMAGNETIC NAVIGATION BRONCHOSCOPY;  Surgeon: Flora Lipps, MD;  Location: [facility] ORS;  Service: Cardiopulmonary;  Laterality: Right;  . LAPAROSCOPIC GASTRIC SLEEVE RESECTION  2015   lost 75 pounds  Current Outpatient Rx  . Order #: [id number] Class: Historical Med  . Order #: [id number] Class: Historical Med  . Order #: [id number] Class: Historical Med  . Order #: [id number] Class: Historical Med  . Order #: [id number] Class: Historical Med  . Order #: [id number] Class: Historical Med  . Order #: [id number] Class: Historical Med  . Order #: [id number] Class: Historical Med  . Order #: [id number] Class: Historical Med  . Order #: [id number] Class: Historical Med  . Order #: [id number] Class: Historical Med   . Order #: [id number] Class: Historical Med  . Order #: [id number] Class: Historical Med  . Order #: [id number] Class: Normal    Allergies Ibuprofen; Levaquin [levofloxacin in d5w]; Sulfamethoxazole-trimethoprim; and Nitroglycerin  Family History  Problem Relation Age of Onset  . Stroke Father   . Ovarian cancer Maternal Grandmother   . Breast cancer Neg Hx     Social History Social History   Tobacco Use  . Smoking status: Former Smoker    Packs/day: 1.00    Years: 5.00    Pack years: 5.00    Types: Cigarettes    Last attempt to quit: 08/12/1991    Years since quitting: 26.6  . Smokeless tobacco: Never Used  Substance Use Topics  . Alcohol use: No  . Drug use: No    Review of Systems Constitutional: No fever/chills.  No lightheadedness or syncope. Eyes: No visual changes. ENT: No sore throat. No congestion or rhinorrhea. Cardiovascular: Denies chest pain. Denies palpitations. Respiratory: Denies shortness of breath.  No cough. Gastrointestinal: Positive for right lower quadrant abdominal pain.  Positive nausea, no vomiting.  No diarrhea.  No constipation. Genitourinary: Negative for dysuria.  Positive urinary frequency.  No hematuria. Musculoskeletal: Positive for right flank pain. Skin: Negative for rash. Neurological: Negative for headaches. No focal numbness, tingling or weakness.     ____________________________________________   PHYSICAL EXAM:  VITAL SIGNS: ED Triage Vitals [04/08/18 0625]  Enc Vitals Group     BP 135/71     Pulse Rate 88     Resp 18     Temp 98 F (36.7 C)     Temp Source Oral     SpO2 98 %     Weight 230 lb (104.3 kg)     Height 5\' 6"  (1.676 m)     Head Circumference      Peak Flow      Pain Score 9     Pain Loc      Pain Edu?      Excl. in [place]?     Constitutional: Alert and oriented. Answers questions appropriately.  The patient is sitting on the edge of the bed and uncomfortable appearing. Eyes: Conjunctivae are normal.   EOMI. No scleral icterus. Head: Atraumatic. Nose: No congestion/rhinnorhea. Mouth/Throat: Mucous membranes are moist.  Neck: No stridor.  Supple.   Cardiovascular: Normal rate, regular rhythm. No murmurs, rubs or gallops.  Respiratory: Normal respiratory effort.  No accessory muscle use or retractions. Lungs CTAB.  No wheezes, rales or ronchi. Gastrointestinal: Morbidly obese.  Positive right CVA tenderness to palpation.  Soft, and nondistended.  Tender to palpation in the right lower quadrant.  No guarding or rebound.  No peritoneal signs. Musculoskeletal: No LE edema.  Neurologic:  A&Ox3.  Speech is clear.  Face and smile are symmetric.  EOMI.  Moves all extremities well. Skin:  Skin is warm, dry and intact. No rash noted. Psychiatric: Mood and affect are normal. Speech and behavior are normal.  Normal judgement.  ____________________________________________   LABS (all labs ordered  are listed, but only abnormal results are displayed)  Labs Reviewed  COMPREHENSIVE METABOLIC PANEL - Abnormal; Notable for the following components:      Result Value   Glucose, Bld 176 (*)    Calcium 8.7 (*)    All other components within normal limits  CBC - Abnormal; Notable for the following components:   RBC 5.12 (*)    All other components within normal limits  URINALYSIS, ROUTINE W REFLEX MICROSCOPIC - Abnormal; Notable for the following components:   Color, Urine AMBER (*)    APPearance CLOUDY (*)    All other components within normal limits   ____________________________________________  EKG  Not indicated ____________________________________________  RADIOLOGY  Ct Renal Stone Study  Result Date: 04/08/2018 CLINICAL DATA:  Right lower back/flank pain EXAM: CT ABDOMEN AND PELVIS WITHOUT CONTRAST TECHNIQUE: Multidetector CT imaging of the abdomen and pelvis was performed following the standard protocol without oral or IV contrast. COMPARISON:  September 14, 2017 FINDINGS: Lower chest:  Several pulmonary nodular lesions are noted throughout both lower lobes measuring between 2 and 5 mm in size. No larger lesions are noted within the lung bases. There is a small hiatal hernia. Hepatobiliary: Liver measures 19.9 cm in length. No focal liver lesions are evident on this noncontrast enhanced study. Gallbladder is absent. There is no biliary duct dilatation. Pancreas: There is no evident pancreatic mass or inflammatory focus. Spleen: Spleen measures 13.6 x 12.9 x 6.2 cm with a measured splenic volume of 544 cubic cm. No focal splenic lesions are evident on this noncontrast enhanced study. Adrenals/Urinary Tract: There is a 1.1 x 0.8 cm adenoma in the left adrenal. Adrenals otherwise appear normal. There is a calculus in the lower pole of the right kidney measuring 8 x 4 mm. There is no renal mass or hydronephrosis on either side. There is no evident ureteral calculus on either side. Urinary bladder is midline with wall thickness within normal limits. Stomach/Bowel: There is moderate stool throughout the colon. There is no appreciable bowel wall or mesenteric thickening. Patient is status post gastric bypass procedure without thickening or fluid in the perioperative regions. There is no evident bowel obstruction. No free air or portal venous air evident. Vascular/Lymphatic: No abdominal aortic aneurysm evident. There is aortic and iliac artery atherosclerosis. Major mesenteric arterial vessels appear patent. There is no evident adenopathy in the abdomen or pelvis. Reproductive: Uterus is absent.  There is no evident pelvic mass. Other: Appendix appears unremarkable. No abscess or ascites is evident in the abdomen or pelvis. There is a small ventral hernia. There is also periumbilical scarring. Musculoskeletal: There are no blastic or lytic bone lesions. There is degenerative change in the lower thoracic and lumbar spine regions. There is no intramuscular lesion. IMPRESSION: 1. 8 x 4 mm nonobstructing  calculus lower pole right kidney. No hydronephrosis or ureteral calculus evident on either side. 2. Prominence of liver and spleen of uncertain etiology. Splenic volume is slightly increased compared to prior study. No focal liver or splenic lesions evident. Gallbladder absent. 3. No evident bowel obstruction. Postoperative changes in the stomach region consistent with previous gastric bypass procedure. No complicating features in this area evident. Small hiatal hernia. No abscess in the abdomen or pelvis. Appendix appears normal. 4.  There is aortoiliac atherosclerosis. 5. Multiple 2-5 mm nodular opacities again noted in the lower lobes. Stability since April 2019 suggests benign etiology. 6.  Uterus absent. 7.  Small hiatal hernia containing only fat. 8.  Stable small left adrenal  adenoma, a benign finding. Aortic Atherosclerosis (ICD10-I70.0). Electronically Signed   By: Lowella Grip III M.D.   On: 04/08/2018 07:53    ____________________________________________   PROCEDURES  Procedure(s) performed: None  Procedures  Critical Care performed: No ____________________________________________   INITIAL IMPRESSION / ASSESSMENT AND PLAN / ED COURSE  Pertinent labs & imaging results that were available during my care of the patient were reviewed by me and considered in my medical decision making (see chart for details).  62 y.o. female with a history of renal colic and recurrent UTI presenting with acute onset of severe sharp right flank pain radiating to the right lower quadrant with associated nausea.  Overall, the patient is hemodynamically stable.  It is possible that she has an undertreated UTI as we do not know the sensitivities of her culture.  Renal colic is also possible.  A musculoskeletal cause is possible.  Aortic pathology is considered but less likely.  The patient will undergo CT imaging, symptom control, and reevaluation for final  disposition.  ----------------------------------------- 7:38 AM on 04/08/2018 ----------------------------------------- The patient's labs show some mild hyperglycemia but otherwise reassuring electrolytes and hepatic function panel.  She has a normal white blood cell count.  Her UA does not show signs of infection or blood.  ----------------------------------------- 8:33 AM on 04/08/2018 -----------------------------------------  Clinically, the patient continues to be hemodynamically stable and her pain has resolved with single dose of fentanyl.  Her laboratory studies are reassuring with normal electrolytes except for the hyperglycemia, and a normal white blood cell count.  Her urinalysis does not show any evidence of ongoing infection or blood.  Her CT scan shows a stable kidney stone in the right pole, without any evidence of obstruction or hydronephrosis.  There is no other acute pathology concerning on the patient's CT scan.  At this time, the patient is safe for discharge home.  I will have her treat her pain with Tylenol and heat therapy as needed, with close PMD follow-up.  Return precautions as well as follow-up instructions were discussed.   ____________________________________________  FINAL CLINICAL IMPRESSION(S) / ED DIAGNOSES  Final diagnoses:  Right flank pain  Right lower quadrant pain  Nausea without vomiting         NEW MEDICATIONS STARTED DURING THIS VISIT:  New Prescriptions   No medications on file      Eula Listen, MD 04/08/18 [phone]

## 2018-04-08 NOTE — ED Triage Notes (Signed)
Patient states that she has right lower back pain that woke her out of sleep tonight. Patient states that she was diagnosed with kidney stone over a month ago but has not passed it. Patient also states that she was diagnosed with a UTI last Monday and her last dose of antibiotics is today.

## 2018-04-08 NOTE — Discharge Instructions (Addendum)
You may use Tylenol for your pain.  A heating pad for 15 minutes every hour when you are awake may help as well.  Please make a follow-up appointment with your primary care physician.  Return to the emergency department if you develop severe pain, lightheadedness or fainting, nausea or vomiting, fever, or any other symptoms concerning to you.

## 2018-04-29 ENCOUNTER — Ambulatory Visit: Payer: 59

## 2018-04-29 ENCOUNTER — Other Ambulatory Visit: Payer: 59

## 2018-05-02 ENCOUNTER — Ambulatory Visit: Payer: 59 | Admitting: Oncology

## 2018-05-08 ENCOUNTER — Ambulatory Visit: Payer: 59 | Admitting: Pulmonary Disease

## 2018-05-09 ENCOUNTER — Encounter: Payer: Self-pay | Admitting: Pulmonary Disease

## 2018-05-13 ENCOUNTER — Emergency Department
Admission: EM | Admit: 2018-05-13 | Discharge: 2018-05-13 | Disposition: A | Discharge status: Home / Self Care | Payer: 59 | Attending: Emergency Medicine | Admitting: Emergency Medicine

## 2018-05-13 ENCOUNTER — Other Ambulatory Visit: Payer: Self-pay

## 2018-05-13 DIAGNOSIS — R5383 Other fatigue: Secondary | ICD-10-CM | POA: Diagnosis not present

## 2018-05-13 DIAGNOSIS — Z87891 Personal history of nicotine dependence: Secondary | ICD-10-CM | POA: Insufficient documentation

## 2018-05-13 DIAGNOSIS — R739 Hyperglycemia, unspecified: Secondary | ICD-10-CM

## 2018-05-13 DIAGNOSIS — Z7984 Long term (current) use of oral hypoglycemic drugs: Secondary | ICD-10-CM | POA: Diagnosis not present

## 2018-05-13 DIAGNOSIS — I10 Essential (primary) hypertension: Secondary | ICD-10-CM | POA: Insufficient documentation

## 2018-05-13 DIAGNOSIS — I25110 Atherosclerotic heart disease of native coronary artery with unstable angina pectoris: Secondary | ICD-10-CM | POA: Diagnosis not present

## 2018-05-13 DIAGNOSIS — R350 Frequency of micturition: Secondary | ICD-10-CM | POA: Insufficient documentation

## 2018-05-13 DIAGNOSIS — J449 Chronic obstructive pulmonary disease, unspecified: Secondary | ICD-10-CM | POA: Insufficient documentation

## 2018-05-13 DIAGNOSIS — Z79899 Other long term (current) drug therapy: Secondary | ICD-10-CM | POA: Diagnosis not present

## 2018-05-13 DIAGNOSIS — E1165 Type 2 diabetes mellitus with hyperglycemia: Secondary | ICD-10-CM | POA: Insufficient documentation

## 2018-05-13 LAB — URINALYSIS, COMPLETE (UACMP) WITH MICROSCOPIC
Bacteria, UA: NONE SEEN
Bilirubin Urine: NEGATIVE
Hgb urine dipstick: NEGATIVE
Ketones, ur: NEGATIVE mg/dL
Nitrite: NEGATIVE
PH: 6 (ref 5.0–8.0)
PROTEIN: NEGATIVE mg/dL
SPECIFIC GRAVITY, URINE: 1.029 (ref 1.005–1.030)

## 2018-05-13 LAB — BASIC METABOLIC PANEL
ANION GAP: 9 (ref 5–15)
BUN: 19 mg/dL (ref 6–20)
CALCIUM: 9.1 mg/dL (ref 8.9–10.3)
CO2: 27 mmol/L (ref 22–32)
CREATININE: 0.85 mg/dL (ref 0.44–1.00)
Chloride: 101 mmol/L (ref 98–111)
GFR calc Af Amer: >60 mL/min (ref >60)
GLUCOSE: 476 mg/dL — AB (ref 70–99)
Potassium: 4 mmol/L (ref 3.5–5.1)
Sodium: 137 mmol/L (ref 135–145)

## 2018-05-13 LAB — CBC
HCT: 47.4 % — ABNORMAL HIGH (ref 36.0–46.0)
Hemoglobin: 15.9 g/dL — ABNORMAL HIGH (ref 12.0–15.0)
MCH: 27.5 pg (ref 26.0–34.0)
MCHC: 33.5 g/dL (ref 30.0–36.0)
MCV: 81.9 fL (ref 80.0–100.0)
PLATELETS: 305 10*3/uL (ref 150–400)
RBC: 5.79 MIL/uL — AB (ref 3.87–5.11)
RDW: 12.4 % (ref 11.5–15.5)
WBC: 10.7 10*3/uL — ABNORMAL HIGH (ref 4.0–10.5)
nRBC: 0 % (ref 0.0–0.2)

## 2018-05-13 LAB — GLUCOSE, CAPILLARY
Glucose-Capillary: 457 mg/dL — ABNORMAL HIGH (ref 70–99)
Glucose-Capillary: 230 mg/dL — ABNORMAL HIGH (ref 70–99)

## 2018-05-13 MED ORDER — INSULIN ASPART 100 UNIT/ML ~~LOC~~ SOLN
SUBCUTANEOUS | Status: AC
  Administered 2018-05-13: 8 [IU] via SUBCUTANEOUS
  Filled 2018-05-13: qty 1

## 2018-05-13 MED ORDER — SODIUM CHLORIDE 0.9 % IV SOLN
1000.0000 mL | Freq: Once | INTRAVENOUS | Status: AC
  Administered 2018-05-13: 1000 mL via INTRAVENOUS

## 2018-05-13 MED ORDER — INSULIN ASPART 100 UNIT/ML ~~LOC~~ SOLN
8.0000 [IU] | Freq: Once | SUBCUTANEOUS | Status: AC
  Administered 2018-05-13: 8 [IU] via SUBCUTANEOUS

## 2018-05-13 NOTE — ED Notes (Addendum)
Pt taking 3 tabs of prednisone daily for 7 days for bronchitis, pt reports elevated blood sugar. Pt takes 500mg  of Metformin BID which she states her sugars are usually under control. Pt reports increased fluid intake and increased urine output. Pt states she has eaten 2 bananas and peanut butter today. Pt states her usual blurry vision is more blurry today. Pt also reports nausea and headache.

## 2018-05-13 NOTE — ED Triage Notes (Signed)
Pt states she has been on prednisone for the past 3 days for bronchitis and her glucose has been out of control for the past 2 days over 600. Pt is a/ox4 in NAD,

## 2018-05-13 NOTE — ED Provider Notes (Signed)
[facility] Emergency Department Provider Note   ____________________________________________    I have reviewed the triage vital signs and the nursing notes.   HISTORY  Chief Complaint Hyperglycemia     HPI Andrea Pollard is a 62 y.o. female who presents with complaints of elevated blood sugar.  Patient reports she has a history of diabetes for which she works hard to keep her sugars controlled.  Recently was started on prednisone for bronchitis.  She reports her sugar has been significantly elevated at one point over 500 today which is why she came to the emergency department.  She feels, fatigued, with frequent urination.  Denies fevers or chills.  No nausea or vomiting.  No abdominal pain   Past Medical History:  Diagnosis Date  . Anxiety   . Asthma   . Complication of anesthesia   . COPD (chronic obstructive pulmonary disease) (HCC)    diagnosed from pulmonary function tests  . Depression   . Diabetes mellitus without complication ([place])   . Dvt femoral (deep venous thrombosis) ([place]) 2018   in lung also.  treated with xarelto for 6 months  . GERD (gastroesophageal reflux disease)   . Hepatitis    AGE 38-HEPATITIS B  . History of hiatal hernia   . History of kidney stones 2019  . History of methicillin resistant staphylococcus aureus (MRSA) 2013   in neck due to ingrown hair. had surgery to drain cyst  . History of palpitations 2019   due to a panic attack  . Hypertension   . Lung nodule 2019   being followed by dr. Grayland Ormond, dr Genevive Bi and dr. Mortimer Fries  . Pneumonia 04/2016  . PONV (postoperative nausea and vomiting)    NAUSEATED  . Pulmonary emboli ([place]) 2018  . Sleep apnea    CPAP  . Vertigo     Patient Active Problem List   Diagnosis Date Noted  . Pulmonary embolism ([place]) 03/04/2017  . Benign hypertension 10/05/2016  . Constipation 10/05/2016  . Gastroesophageal reflux disease 10/05/2016  . Hypercholesterolemia 10/05/2016  .  Indigestion 10/05/2016  . Obstructive sleep apnea of adult 10/05/2016  . Type 2 diabetes mellitus ([place]) 10/05/2016  . Vitamin D deficiency 10/05/2016  . Coronary artery disease of native artery of native heart with stable angina pectoris ([place]) 09/27/2016  . Aortic atherosclerosis ([place]) 09/27/2016  . History of smoking 09/27/2016  . Lung nodule 06/07/2016  . Morbid obesity ([place]) 04/23/2014    Past Surgical History:  Procedure Laterality Date  . ABDOMINAL HYSTERECTOMY  1994  . CESAREAN SECTION    . CHOLECYSTECTOMY  2013  . CYST REMOVAL NECK  2013   was ingrown hair . positive for mrsa at that time prior to removal in OR  . ELECTROMAGNETIC NAVIGATION BROCHOSCOPY N/A 08/15/2016   Procedure: ELECTROMAGNETIC NAVIGATION BRONCHOSCOPY;  Surgeon: Flora Lipps, MD;  Location: [facility] ORS;  Service: Cardiopulmonary;  Laterality: N/A;  . ELECTROMAGNETIC NAVIGATION BROCHOSCOPY Right 11/07/2017   Procedure: ELECTROMAGNETIC NAVIGATION BRONCHOSCOPY;  Surgeon: Flora Lipps, MD;  Location: [facility] ORS;  Service: Cardiopulmonary;  Laterality: Right;  . LAPAROSCOPIC GASTRIC SLEEVE RESECTION  2015   lost 75 pounds    Prior to Admission medications   Medication Sig Start Date End Date Taking? Authorizing Provider  albuterol (PROAIR HFA) 108 (90 Base) MCG/ACT inhaler Inhale 2 Inhalers into the lungs as needed for wheezing or shortness of breath.     [provider]  ALPRAZolam Duanne Moron) 0.25 MG tablet Take 0.25 mg by  mouth daily as needed for anxiety.    [provider]  atorvastatin (LIPITOR) 80 MG tablet Take 80 mg by mouth daily. 08/30/16   [provider]  buPROPion (WELLBUTRIN) 75 MG tablet Take 75 mg by mouth daily.    [provider]  busPIRone (BUSPAR) 5 MG tablet Take 10 mg by mouth 2 (two) times daily.     [provider]  citalopram (CELEXA) 20 MG tablet Take 20 mg by mouth daily.  10/11/16   [provider]  FLOVENT HFA 220 MCG/ACT inhaler Inhale 2 puffs  into the lungs 2 (two) times daily. 12/11/16   [provider]  hydrOXYzine (VISTARIL) 25 MG capsule Take 25-50 mg by mouth 3 (three) times daily as needed for anxiety.    [provider]  lansoprazole (PREVACID) 15 MG capsule Take 15 mg by mouth at bedtime.     [provider]  lisinopril (PRINIVIL,ZESTRIL) 5 MG tablet Take 5 mg by mouth daily. 12/26/17   [provider]  metFORMIN (GLUCOPHAGE-XR) 500 MG 24 hr tablet Take 2 tablets by mouth 2 (two) times daily. 03/23/16   [provider]  predniSONE (DELTASONE) 10 MG tablet Take 4 tablets (40 mg total) by mouth daily. Patient not taking: Reported on 04/08/2018 11/09/17   Flora Lipps, MD  sertraline (ZOLOFT) 100 MG tablet Take 100 mg by mouth daily. 12/03/17   [provider]     Allergies Ibuprofen; Levaquin [levofloxacin in d5w]; Sulfamethoxazole-trimethoprim; and Nitroglycerin  Family History  Problem Relation Age of Onset  . Stroke Father   . Ovarian cancer Maternal Grandmother   . Breast cancer Neg Hx     Social History Social History   Tobacco Use  . Smoking status: Former Smoker    Packs/day: 1.00    Years: 5.00    Pack years: 5.00    Types: Cigarettes    Last attempt to quit: 08/12/1991    Years since quitting: 26.7  . Smokeless tobacco: Never Used  Substance Use Topics  . Alcohol use: No  . Drug use: No    Review of Systems  Constitutional: No fever/chills Eyes: No blurry vision ENT: No sore throat. Cardiovascular: Denies chest pain. Respiratory: Denies shortness of breath. Gastrointestinal: As above Genitourinary: Negative for dysuria.  Polyuria Musculoskeletal: Negative for back pain. Skin: Negative for rash. Neurological: Negative for headache   ____________________________________________   PHYSICAL EXAM:  VITAL SIGNS: ED Triage Vitals [05/13/18 1640]  Enc Vitals Group     BP (!) 159/97     Pulse Rate (!) 105     Resp 18     Temp 97.8 F (36.6  C)     Temp Source Oral     SpO2 96 %     Weight 104.3 kg (230 lb)     Height 1.651 m (5\' 5" )     Head Circumference      Peak Flow      Pain Score 0     Pain Loc      Pain Edu?      Excl. in [place]?     Constitutional: Alert and oriented.  Eyes: Conjunctivae are normal.  Head: Atraumatic. Nose: No congestion/rhinnorhea. Mouth/Throat: Mucous membranes are moist.    Cardiovascular: Normal rate, regular rhythm. Grossly normal heart sounds.  Good peripheral circulation. Respiratory: Normal respiratory effort.  No retractions. Lungs CTAB. Gastrointestinal: Soft and nontender. No distention.   Musculoskeletal:.  Warm and well perfused Neurologic:  Normal speech and language. No  gross focal neurologic deficits are appreciated.  Skin:  Skin is warm, dry and intact. No rash noted. Psychiatric: Mood and affect are normal. Speech and behavior are normal.  ____________________________________________   LABS (all labs ordered are listed, but only abnormal results are displayed)  Labs Reviewed  BASIC METABOLIC PANEL - Abnormal; Notable for the following components:      Result Value   Glucose, Bld 476 (*)    All other components within normal limits  CBC - Abnormal; Notable for the following components:   WBC 10.7 (*)    RBC 5.79 (*)    Hemoglobin 15.9 (*)    HCT 47.4 (*)    All other components within normal limits  URINALYSIS, COMPLETE (UACMP) WITH MICROSCOPIC - Abnormal; Notable for the following components:   Color, Urine STRAW (*)    APPearance CLEAR (*)    Glucose, UA >=500 (*)    Leukocytes, UA TRACE (*)    All other components within normal limits  GLUCOSE, CAPILLARY - Abnormal; Notable for the following components:   Glucose-Capillary 457 (*)    All other components within normal limits  GLUCOSE, CAPILLARY - Abnormal; Notable for the following components:   Glucose-Capillary 230 (*)    All other components within normal limits  CBG MONITORING, ED    ____________________________________________  EKG  None ____________________________________________  RADIOLOGY  None ____________________________________________   PROCEDURES  Procedure(s) performed: No  Procedures   Critical Care performed: No ____________________________________________   INITIAL IMPRESSION / ASSESSMENT AND PLAN / ED COURSE  Pertinent labs & imaging results that were available during my care of the patient were reviewed by me and considered in my medical decision making (see chart for details).  Patient overall well-appearing and in no acute distress.  Her glucose is elevated but anion gap is reassuring.  We will give IV fluids, subcu insulin and monitor her glucose.  I recommend that she stop the prednisone because she reports her cough is much better  Glucose improved to 230, appropriate for discharge    ____________________________________________   FINAL CLINICAL IMPRESSION(S) / ED DIAGNOSES  Final diagnoses:  Hyperglycemia        Note:  This document was prepared using Dragon voice recognition software and may include unintentional dictation errors.    Lavonia Drafts, MD 05/13/18 2055

## 2018-05-13 NOTE — ED Notes (Signed)
ED Provider at bedside. 

## 2018-05-15 ENCOUNTER — Emergency Department
Admission: EM | Admit: 2018-05-15 | Discharge: 2018-05-16 | Disposition: A | Discharge status: Home / Self Care | Payer: 59 | Attending: Student in an Organized Health Care Education/Training Program | Admitting: Student in an Organized Health Care Education/Training Program

## 2018-05-15 ENCOUNTER — Other Ambulatory Visit: Payer: Self-pay

## 2018-05-15 ENCOUNTER — Emergency Department: Payer: 59

## 2018-05-15 DIAGNOSIS — Z87891 Personal history of nicotine dependence: Secondary | ICD-10-CM | POA: Diagnosis not present

## 2018-05-15 DIAGNOSIS — Z7984 Long term (current) use of oral hypoglycemic drugs: Secondary | ICD-10-CM | POA: Insufficient documentation

## 2018-05-15 DIAGNOSIS — N12 Tubulo-interstitial nephritis, not specified as acute or chronic: Secondary | ICD-10-CM | POA: Insufficient documentation

## 2018-05-15 DIAGNOSIS — R300 Dysuria: Secondary | ICD-10-CM | POA: Diagnosis not present

## 2018-05-15 DIAGNOSIS — I10 Essential (primary) hypertension: Secondary | ICD-10-CM | POA: Diagnosis not present

## 2018-05-15 DIAGNOSIS — R109 Unspecified abdominal pain: Secondary | ICD-10-CM | POA: Diagnosis present

## 2018-05-15 DIAGNOSIS — E119 Type 2 diabetes mellitus without complications: Secondary | ICD-10-CM | POA: Diagnosis not present

## 2018-05-15 DIAGNOSIS — Z79899 Other long term (current) drug therapy: Secondary | ICD-10-CM | POA: Diagnosis not present

## 2018-05-15 DIAGNOSIS — J449 Chronic obstructive pulmonary disease, unspecified: Secondary | ICD-10-CM | POA: Diagnosis not present

## 2018-05-15 LAB — CBC
HEMATOCRIT: 44.6 % (ref 36.0–46.0)
HEMOGLOBIN: 15 g/dL (ref 12.0–15.0)
MCH: 27.1 pg (ref 26.0–34.0)
MCHC: 33.6 g/dL (ref 30.0–36.0)
MCV: 80.5 fL (ref 80.0–100.0)
Platelets: 239 10*3/uL (ref 150–400)
RBC: 5.54 MIL/uL — ABNORMAL HIGH (ref 3.87–5.11)
RDW: 12.4 % (ref 11.5–15.5)
WBC: 12 10*3/uL — ABNORMAL HIGH (ref 4.0–10.5)
nRBC: 0 % (ref 0.0–0.2)

## 2018-05-15 LAB — URINALYSIS, COMPLETE (UACMP) WITH MICROSCOPIC
Bilirubin Urine: NEGATIVE
Glucose, UA: NEGATIVE mg/dL
Ketones, ur: NEGATIVE mg/dL
Nitrite: POSITIVE — AB
Protein, ur: 100 mg/dL — AB
RBC / HPF: >50 RBC/hpf — ABNORMAL HIGH (ref 0–5)
Specific Gravity, Urine: 1.016 (ref 1.005–1.030)
pH: 6 (ref 5.0–8.0)

## 2018-05-15 LAB — COMPREHENSIVE METABOLIC PANEL
ALBUMIN: 3.9 g/dL (ref 3.5–5.0)
ALT: 18 U/L (ref 0–44)
AST: 14 U/L — AB (ref 15–41)
Alkaline Phosphatase: 89 U/L (ref 38–126)
Anion gap: 8 (ref 5–15)
BUN: 15 mg/dL (ref 6–20)
CHLORIDE: 104 mmol/L (ref 98–111)
CO2: 27 mmol/L (ref 22–32)
Calcium: 9 mg/dL (ref 8.9–10.3)
Creatinine, Ser: 0.69 mg/dL (ref 0.44–1.00)
GFR calc Af Amer: >60 mL/min (ref >60)
GFR calc non Af Amer: >60 mL/min (ref >60)
GLUCOSE: 247 mg/dL — AB (ref 70–99)
Potassium: 3.7 mmol/L (ref 3.5–5.1)
Sodium: 139 mmol/L (ref 135–145)
Total Bilirubin: 0.9 mg/dL (ref 0.3–1.2)
Total Protein: 7 g/dL (ref 6.5–8.1)

## 2018-05-15 LAB — LIPASE, BLOOD: Lipase: 36 U/L (ref 11–51)

## 2018-05-15 MED ORDER — FENTANYL CITRATE (PF) 100 MCG/2ML IJ SOLN
50.0000 ug | INTRAMUSCULAR | Status: DC | PRN
  Administered 2018-05-15: 50 ug via INTRAVENOUS
  Filled 2018-05-15: qty 2

## 2018-05-15 MED ORDER — CEPHALEXIN 500 MG PO CAPS: 500.0000 mg | ORAL_CAPSULE | Freq: Three times a day (TID) | ORAL | 0 refills | Status: AC

## 2018-05-15 MED ORDER — SODIUM CHLORIDE 0.9 % IV BOLUS
500.0000 mL | Freq: Once | INTRAVENOUS | Status: AC
  Administered 2018-05-15: 500 mL via INTRAVENOUS

## 2018-05-15 MED ORDER — GENTAMICIN SULFATE 40 MG/ML IJ SOLN
5.0000 mg/kg | Freq: Once | INTRAVENOUS | Status: AC
  Administered 2018-05-15: 520 mg via INTRAVENOUS
  Filled 2018-05-15: qty 13

## 2018-05-15 MED ORDER — SODIUM CHLORIDE 0.9 % IV SOLN
1.0000 g | Freq: Once | INTRAVENOUS | Status: AC
  Administered 2018-05-15: 1 g via INTRAVENOUS
  Filled 2018-05-15: qty 10

## 2018-05-15 MED ORDER — KETOROLAC TROMETHAMINE 30 MG/ML IJ SOLN
15.0000 mg | Freq: Once | INTRAMUSCULAR | Status: AC
  Administered 2018-05-15: 15 mg via INTRAVENOUS
  Filled 2018-05-15: qty 1

## 2018-05-15 NOTE — ED Triage Notes (Addendum)
Pt comes via POV from home with c/o right abdominal pain and back pain. Pt states this started yesterday. Pt also states pain when urinating, denies burning.  Pt states nausea. Pt also states she has been dx with kidney stone a long time ago and as far as she knows she is still in same place.

## 2018-05-15 NOTE — ED Provider Notes (Signed)
[facility] Emergency Department Provider Note    First MD Initiated Contact with Patient 05/15/18 2102     (approximate)  I have reviewed the triage vital signs and the nursing notes.   HISTORY  Chief Complaint Abdominal Pain    HPI Andrea Pollard is a 62 y.o. female presents the ER for evaluation of right flank pain dysuria this started over the past 24 hours.   States that he does have a history of kidney stone but this feels different.  Is primarily having discomfort when urinating and is scribes heaviness and pressure.  Has felt low-grade temperature and chills at home.  Has not done anything to try to help the pain.  Did take a single dose of Azo prior to arrival.   Past Medical History:  Diagnosis Date  . Anxiety   . Asthma   . Complication of anesthesia   . COPD (chronic obstructive pulmonary disease) (HCC)    diagnosed from pulmonary function tests  . Depression   . Diabetes mellitus without complication ([place])   . Dvt femoral (deep venous thrombosis) ([place]) 2018   in lung also.  treated with xarelto for 6 months  . GERD (gastroesophageal reflux disease)   . Hepatitis    AGE 43-HEPATITIS B  . History of hiatal hernia   . History of kidney stones 2019  . History of methicillin resistant staphylococcus aureus (MRSA) 2013   in neck due to ingrown hair. had surgery to drain cyst  . History of palpitations 2019   due to a panic attack  . Hypertension   . Lung nodule 2019   being followed by dr. Grayland Ormond, dr Genevive Bi and dr. Mortimer Fries  . Pneumonia 04/2016  . PONV (postoperative nausea and vomiting)    NAUSEATED  . Pulmonary emboli ([place]) 2018  . Sleep apnea    CPAP  . Vertigo    Family History  Problem Relation Age of Onset  . Stroke Father   . Ovarian cancer Maternal Grandmother   . Breast cancer Neg Hx    Past Surgical History:  Procedure Laterality Date  . ABDOMINAL HYSTERECTOMY  1994  . CESAREAN SECTION    . CHOLECYSTECTOMY  2013    . CYST REMOVAL NECK  2013   was ingrown hair . positive for mrsa at that time prior to removal in OR  . ELECTROMAGNETIC NAVIGATION BROCHOSCOPY N/A 08/15/2016   Procedure: ELECTROMAGNETIC NAVIGATION BRONCHOSCOPY;  Surgeon: Flora Lipps, MD;  Location: [facility] ORS;  Service: Cardiopulmonary;  Laterality: N/A;  . ELECTROMAGNETIC NAVIGATION BROCHOSCOPY Right 11/07/2017   Procedure: ELECTROMAGNETIC NAVIGATION BRONCHOSCOPY;  Surgeon: Flora Lipps, MD;  Location: [facility] ORS;  Service: Cardiopulmonary;  Laterality: Right;  . LAPAROSCOPIC GASTRIC SLEEVE RESECTION  2015   lost 75 pounds   Patient Active Problem List   Diagnosis Date Noted  . Pulmonary embolism ([place]) 03/04/2017  . Benign hypertension 10/05/2016  . Constipation 10/05/2016  . Gastroesophageal reflux disease 10/05/2016  . Hypercholesterolemia 10/05/2016  . Indigestion 10/05/2016  . Obstructive sleep apnea of adult 10/05/2016  . Type 2 diabetes mellitus ([place]) 10/05/2016  . Vitamin D deficiency 10/05/2016  . Coronary artery disease of native artery of native heart with stable angina pectoris ([place]) 09/27/2016  . Aortic atherosclerosis ([place]) 09/27/2016  . History of smoking 09/27/2016  . Lung nodule 06/07/2016  . Morbid obesity ([place]) 04/23/2014      Prior to Admission medications   Medication Sig Start Date End Date Taking? Authorizing Provider  albuterol (PROAIR  HFA) 108 (90 Base) MCG/ACT inhaler Inhale 2 Inhalers into the lungs as needed for wheezing or shortness of breath.     [provider]  ALPRAZolam Duanne Moron) 0.25 MG tablet Take 0.25 mg by mouth daily as needed for anxiety.    [provider]  atorvastatin (LIPITOR) 80 MG tablet Take 80 mg by mouth daily. 08/30/16   [provider]  buPROPion (WELLBUTRIN) 75 MG tablet Take 75 mg by mouth daily.    [provider]  busPIRone (BUSPAR) 5 MG tablet Take 10 mg by mouth 2 (two) times daily.     [provider]  cephALEXin (KEFLEX) 500 MG  capsule Take 1 capsule (500 mg total) by mouth 3 (three) times daily for 7 days. 05/15/18 05/22/18  Merlyn Lot, MD  citalopram (CELEXA) 20 MG tablet Take 20 mg by mouth daily.  10/11/16   [provider]  FLOVENT HFA 220 MCG/ACT inhaler Inhale 2 puffs into the lungs 2 (two) times daily. 12/11/16   [provider]  hydrOXYzine (VISTARIL) 25 MG capsule Take 25-50 mg by mouth 3 (three) times daily as needed for anxiety.    [provider]  lansoprazole (PREVACID) 15 MG capsule Take 15 mg by mouth at bedtime.     [provider]  lisinopril (PRINIVIL,ZESTRIL) 5 MG tablet Take 5 mg by mouth daily. 12/26/17   [provider]  metFORMIN (GLUCOPHAGE-XR) 500 MG 24 hr tablet Take 2 tablets by mouth 2 (two) times daily. 03/23/16   [provider]  predniSONE (DELTASONE) 10 MG tablet Take 4 tablets (40 mg total) by mouth daily. Patient not taking: Reported on 04/08/2018 11/09/17   Flora Lipps, MD  sertraline (ZOLOFT) 100 MG tablet Take 100 mg by mouth daily. 12/03/17   [provider]    Allergies Ibuprofen; Levaquin [levofloxacin in d5w]; Sulfamethoxazole-trimethoprim; and Nitroglycerin    Social History Social History   Tobacco Use  . Smoking status: Former Smoker    Packs/day: 1.00    Years: 5.00    Pack years: 5.00    Types: Cigarettes    Last attempt to quit: 08/12/1991    Years since quitting: 26.7  . Smokeless tobacco: Never Used  Substance Use Topics  . Alcohol use: No  . Drug use: No    Review of Systems Patient denies headaches, rhinorrhea, blurry vision, numbness, shortness of breath, chest pain, edema, cough, abdominal pain, nausea, vomiting, diarrhea, dysuria, fevers, rashes or hallucinations unless otherwise stated above in HPI. ____________________________________________   PHYSICAL EXAM:  VITAL SIGNS: Vitals:   05/15/18 1717 05/15/18 2257  BP: (!) 148/94 119/68  Pulse: 65 84  Resp: 20 18  Temp: 98.5 F  (36.9 C) 98.3 F (36.8 C)  SpO2: 97% 95%    Constitutional: Alert and oriented.  Eyes: Conjunctivae are normal.  Head: Atraumatic. Nose: No congestion/rhinnorhea. Mouth/Throat: Mucous membranes are moist.   Neck: No stridor. Painless ROM.  Cardiovascular: Normal rate, regular rhythm. Grossly normal heart sounds.  Good peripheral circulation. Respiratory: Normal respiratory effort.  No retractions. Lungs CTAB. Gastrointestinal: Soft and nontender. No distention. No abdominal bruits. + right CVA tenderness. Genitourinary:  Musculoskeletal: No lower extremity tenderness nor edema.  No joint effusions. Neurologic:  Normal speech and language. No gross focal neurologic deficits are appreciated. No facial droop Skin:  Skin is warm, dry and intact. No rash noted. Psychiatric: Mood and affect are normal. Speech and behavior are normal.  ____________________________________________   LABS (all labs ordered are listed, but only  abnormal results are displayed)  Results for orders placed or performed during the hospital encounter of 05/15/18 (from the past 24 hour(s))  Lipase, blood     Status: None   Collection Time: 05/15/18  5:19 PM  Result Value Ref Range   Lipase 36 11 - 51 U/L  Comprehensive metabolic panel     Status: Abnormal   Collection Time: 05/15/18  5:19 PM  Result Value Ref Range   Sodium 139 135 - 145 mmol/L   Potassium 3.7 3.5 - 5.1 mmol/L   Chloride 104 98 - 111 mmol/L   CO2 27 22 - 32 mmol/L   Glucose, Bld 247 (H) 70 - 99 mg/dL   BUN 15 6 - 20 mg/dL   Creatinine, Ser 0.69 0.44 - 1.00 mg/dL   Calcium 9.0 8.9 - 10.3 mg/dL   Total Protein 7.0 6.5 - 8.1 g/dL   Albumin 3.9 3.5 - 5.0 g/dL   AST 14 (L) 15 - 41 U/L   ALT 18 0 - 44 U/L   Alkaline Phosphatase 89 38 - 126 U/L   Total Bilirubin 0.9 0.3 - 1.2 mg/dL   GFR calc non Af Amer >60 >60 mL/min   GFR calc Af Amer >60 >60 mL/min   Anion gap 8 5 - 15  CBC     Status: Abnormal   Collection Time: 05/15/18  5:19 PM   Result Value Ref Range   WBC 12.0 (H) 4.0 - 10.5 K/uL   RBC 5.54 (H) 3.87 - 5.11 MIL/uL   Hemoglobin 15.0 12.0 - 15.0 g/dL   HCT 44.6 36.0 - 46.0 %   MCV 80.5 80.0 - 100.0 fL   MCH 27.1 26.0 - 34.0 pg   MCHC 33.6 30.0 - 36.0 g/dL   RDW 12.4 11.5 - 15.5 %   Platelets 239 150 - 400 K/uL   nRBC 0.0 0.0 - 0.2 %  Urinalysis, Complete w Microscopic     Status: Abnormal   Collection Time: 05/15/18  5:19 PM  Result Value Ref Range   Color, Urine AMBER (A) YELLOW   APPearance CLOUDY (A) CLEAR   Specific Gravity, Urine 1.016 1.005 - 1.030   pH 6.0 5.0 - 8.0   Glucose, UA NEGATIVE NEGATIVE mg/dL   Hgb urine dipstick MODERATE (A) NEGATIVE   Bilirubin Urine NEGATIVE NEGATIVE   Ketones, ur NEGATIVE NEGATIVE mg/dL   Protein, ur 100 (A) NEGATIVE mg/dL   Nitrite POSITIVE (A) NEGATIVE   Leukocytes, UA MODERATE (A) NEGATIVE   RBC / HPF >50 (H) 0 - 5 RBC/hpf   WBC, UA >50 (H) 0 - 5 WBC/hpf   Bacteria, UA RARE (A) NONE SEEN   Squamous Epithelial / LPF 6-10 0 - 5   Mucus PRESENT    ____________________________________________  ____________________________________________  RADIOLOGY  I personally reviewed all radiographic images ordered to evaluate for the above acute complaints and reviewed radiology reports and findings.  These findings were personally discussed with the patient.  Please see medical record for radiology report.  ____________________________________________   PROCEDURES  Procedure(s) performed:  Procedures    Critical Care performed: no ____________________________________________   INITIAL IMPRESSION / ASSESSMENT AND PLAN / ED COURSE  Pertinent labs & imaging results that were available during my care of the patient were reviewed by me and considered in my medical decision making (see chart for details).   DDX: Stone, pyelonephritis, cystitis, colitis, musculoskeletal strain  Andrea Pollard is a 62 y.o. who presents to the ED with dysuria and  flank pain as  described above.  Patient appears nontoxic but uncomfortable.  She is afebrile and hemodynamically stable.  Abdominal exam is benign but given her dysuria and flank pain will order CT imaging to exclude obstructing stone.  Will provide IV fluids as well as IV Rocephin and IV pain medication.  Clinical Course as of May 15 2320  Wed May 15, 2018  2210 I placed phone call to Consult with Dr. Caprice Beaver of urology regarding the patient's presentation.  He does recommend Dose of gentamicin in the ER as she does have a history of Pseudomonas.  The stone does not show any sign of obstruction at this point and she can otherwise be treated as an outpatient with strict return precautions.   [PR]  2243 Patient reassessed.  Repeat abdominal exam soft benign.  Pain improved.  Afebrile and hemodynamically stable.  She is appropriate for outpatient follow-up.   [PR]    Clinical Course User Index [PR] Merlyn Lot, MD     As part of my medical decision making, I reviewed the following data within the [facility] notes reviewed and incorporated, Labs reviewed, notes from prior ED visits  ____________________________________________   FINAL CLINICAL IMPRESSION(S) / ED DIAGNOSES  Final diagnoses:  Pyelonephritis      NEW MEDICATIONS STARTED DURING THIS VISIT:  New Prescriptions   CEPHALEXIN (KEFLEX) 500 MG CAPSULE    Take 1 capsule (500 mg total) by mouth 3 (three) times daily for 7 days.     Note:  This document was prepared using Dragon voice recognition software and may include unintentional dictation errors.    Merlyn Lot, MD 05/15/18 [phone]

## 2018-05-15 NOTE — ED Notes (Signed)
Patient off unit to CT abd, r/o renal stones.

## 2018-05-15 NOTE — ED Notes (Signed)
Returned from CT IV hep lock placed, meds given as ordered. IVF infusing. Will monitor.

## 2018-05-15 NOTE — ED Notes (Signed)
Pt given urine cup for urine collection

## 2018-05-17 ENCOUNTER — Other Ambulatory Visit: Payer: Self-pay

## 2018-05-17 DIAGNOSIS — N200 Calculus of kidney: Secondary | ICD-10-CM

## 2018-05-23 ENCOUNTER — Ambulatory Visit: Payer: Managed Care, Other (non HMO) | Admitting: Urology

## 2018-05-23 ENCOUNTER — Ambulatory Visit
Admission: RE | Admit: 2018-05-23 | Discharge: 2018-05-23 | Disposition: A | Discharge status: Home / Self Care | Payer: 59 | Source: Ambulatory Visit | Attending: Urology | Admitting: Urology

## 2018-05-23 ENCOUNTER — Encounter: Payer: Self-pay | Admitting: Urology

## 2018-05-23 ENCOUNTER — Other Ambulatory Visit: Payer: Self-pay

## 2018-05-23 ENCOUNTER — Ambulatory Visit
Admission: RE | Admit: 2018-05-23 | Discharge: 2018-05-23 | Disposition: A | Discharge status: Home / Self Care | Payer: 59 | Attending: Urology | Admitting: Urology

## 2018-05-23 VITALS — BP 154/83 | HR 87 | Ht 65.0 in | Wt 230.0 lb

## 2018-05-23 DIAGNOSIS — N200 Calculus of kidney: Secondary | ICD-10-CM

## 2018-05-23 DIAGNOSIS — N12 Tubulo-interstitial nephritis, not specified as acute or chronic: Secondary | ICD-10-CM | POA: Diagnosis not present

## 2018-05-23 LAB — URINALYSIS, COMPLETE
BILIRUBIN UA: NEGATIVE
GLUCOSE, UA: NEGATIVE
NITRITE UA: NEGATIVE
PH UA: 5.5 (ref 5.0–7.5)
Specific Gravity, UA: 1.025 (ref 1.005–1.030)
UUROB: 0.2 mg/dL (ref 0.2–1.0)

## 2018-05-23 LAB — MICROSCOPIC EXAMINATION: Epithelial Cells (non renal): >10 /hpf — ABNORMAL HIGH (ref 0–10)

## 2018-05-23 MED ORDER — FLUCONAZOLE 100 MG PO TABS: 100.0000 mg | ORAL_TABLET | Freq: Every day | ORAL | 0 refills | Status: DC

## 2018-05-23 NOTE — Progress Notes (Addendum)
05/23/2018 8:50 AM   [name] 05/16/1956 [id number]  Referring provider: Ellamae Sia, MD [place], [place] [id number]  CC: Right flank pain, recurrent UTI  HPI: I had the pleasure of seeing Ms. Zabinski in urology clinic for evaluation of right-sided flank pain.  She is a 63 year old female that reports recurrent UTIs (only prior urine culture from January 2019 with Pseudomonas on chart review) as well as 1 year of intermittent right-sided sharp flank pain.  She was seen in the emergency department on 05/15/2018 with urinary symptoms and right flank pain, and a CT renal stone protocol showed no hydronephrosis, however there was a 7 mm stone in the right renal pelvis.  This was previously located in the right lower pole on CT scan in November 2019.  Urinalysis on 12/25 was concerning for infection with greater than 50 WBCs, nitrite positive, and rare bacteria, however this was not sent for urine culture.  She was started on Keflex at that time.  She has never had a kidney stone before or undergone urologic surgery.  She denies any fevers or chills.  Repeat urine culture 05/16/2018 showed no growth.  There are no aggravating or alleviating factors.  Severity is moderate to severe.  Her past medical history is notable for diabetes, history of DVT and PE (no longer on anticoagulation), and COPD.  Also has undergone gastric sleeve.  PMH: Past Medical History:  Diagnosis Date  . Anxiety   . Asthma   . Complication of anesthesia   . COPD (chronic obstructive pulmonary disease) (HCC)    diagnosed from pulmonary function tests  . Depression   . Diabetes mellitus without complication ([place])   . Dvt femoral (deep venous thrombosis) ([place]) 2018   in lung also.  treated with xarelto for 6 months  . GERD (gastroesophageal reflux disease)   . Hepatitis    AGE 78-HEPATITIS B  . History of hiatal hernia   . History of kidney stones 2019  . History of methicillin  resistant staphylococcus aureus (MRSA) 2013   in neck due to ingrown hair. had surgery to drain cyst  . History of palpitations 2019   due to a panic attack  . Hypertension   . Lung nodule 2019   being followed by dr. Grayland Ormond, dr Genevive Bi and dr. Mortimer Fries  . Pneumonia 04/2016  . PONV (postoperative nausea and vomiting)    NAUSEATED  . Pulmonary emboli ([place]) 2018  . Sleep apnea    CPAP  . Vertigo     Surgical History: Past Surgical History:  Procedure Laterality Date  . ABDOMINAL HYSTERECTOMY  1994  . CESAREAN SECTION    . CHOLECYSTECTOMY  2013  . CYST REMOVAL NECK  2013   was ingrown hair . positive for mrsa at that time prior to removal in OR  . ELECTROMAGNETIC NAVIGATION BROCHOSCOPY N/A 08/15/2016   Procedure: ELECTROMAGNETIC NAVIGATION BRONCHOSCOPY;  Surgeon: Flora Lipps, MD;  Location: [facility] ORS;  Service: Cardiopulmonary;  Laterality: N/A;  . ELECTROMAGNETIC NAVIGATION BROCHOSCOPY Right 11/07/2017   Procedure: ELECTROMAGNETIC NAVIGATION BRONCHOSCOPY;  Surgeon: Flora Lipps, MD;  Location: [facility] ORS;  Service: Cardiopulmonary;  Laterality: Right;  . LAPAROSCOPIC GASTRIC SLEEVE RESECTION  2015   lost 75 pounds    Allergies:  Allergies  Allergen Reactions  . Ibuprofen Other (See Comments)    Cannot Use due to Gastric SLEEVE SURGERY  . Levaquin [Levofloxacin In D5w] Hives  . Sulfamethoxazole-Trimethoprim Hives  . Nitroglycerin Other (See Comments)  Severe Hypotension    Family History: Family History  Problem Relation Age of Onset  . Stroke Father   . Ovarian cancer Maternal Grandmother   . Breast cancer Neg Hx     Social History:  reports that she quit smoking about 26 years ago. Her smoking use included cigarettes. She has a 5.00 pack-year smoking history. She has never used smokeless tobacco. She reports that she does not drink alcohol or use drugs.  ROS: Please see flowsheet from today's date for complete review of systems.  Physical Exam: BP (!) 154/83 (BP  Location: Left Arm, Patient Position: Sitting)   Pulse 87   Ht 5\' 5"  (1.651 m)   Wt 230 lb (104.3 kg)   BMI 38.27 kg/m    Constitutional:  Alert and oriented, No acute distress. Cardiovascular: No clubbing, cyanosis, or edema. Respiratory: Normal respiratory effort, no increased work of breathing. GI: Abdomen is soft, nontender, nondistended, no abdominal masses GU: Mild right CVA tenderness Lymph: No cervical or inguinal lymphadenopathy. Skin: No rashes, bruises or suspicious lesions. Neurologic: Grossly intact, no focal deficits, moving all 4 extremities. Psychiatric: Normal mood and affect.  Laboratory Data: Reviewed Urine culture 12/26 no growth Urinalysis today 6-10 WBCs, greater than 10 epithelial cells, many bacteria, nitrite negative, yeast  Pertinent Imaging: I have personally reviewed the CT stone protocol dated 05/15/2018.  There is no hydronephrosis.  There is a 7 mm right renal pelvis stone, possible this is causing a ball valving effect with intermittent obstruction of the UPJ.  Stone visible on KUB over area of the renal pelvis  Assessment & Plan:   In summary, Ms. Boothe is a 63 year old female with a history of recurrent UTI, as well as a 7 mm right renal pelvis stone that may be causing intermittent right renal colic.  Her CT scan from 12/25 does not show any obstruction or hydronephrosis.  She has no clinical or laboratory signs of infection, and urine culture 12/26 was no growth.  We did discuss the possibility that her right-sided pain is unrelated to her stone, however her story is certainly convincing for intermittent obstruction from a ball-valving right renal pelvis stone.  We discussed various treatment options for urolithiasis including observation with or without medical expulsive therapy, shockwave lithotripsy (SWL), ureteroscopy and laser lithotripsy with stent placement, and percutaneous nephrolithotomy.  We discussed that management is based on stone  size, location, density, patient co-morbidities, and patient preference.   SWL has a lower stone free rate in a single procedure, but also a lower complication rate compared to ureteroscopy and avoids a stent and associated stent related symptoms. Possible complications include renal hematoma, steinstrasse, and need for additional treatment.  Ureteroscopy with laser lithotripsy and stent placement has a higher stone free rate than SWL in a single procedure, however increased complication rate including possible infection, ureteral injury, bleeding, and stent related morbidity. Common stent related symptoms include dysuria, urgency/frequency, and flank pain.  After an extensive discussion of the risks and benefits of the above treatment options, the patient would like to proceed with right ureteroscopy, laser lithotripsy, stent placement.  Schedule right ureteroscopy, laser lithotripsy, stent placement Fluconazole x3 days for yeast in urine  Billey Co, MD  [facility] [address], [place], [place] [address] [phone]

## 2018-05-24 ENCOUNTER — Telehealth: Payer: Self-pay

## 2018-05-24 NOTE — Telephone Encounter (Signed)
Patient called from Pre-Admit about Diflucan. Advised patient it was at the pharmacy and she is to take it for 3 days per Dr. Diamantina Providence. Patient asked that stone pamphlet be mailed to her. Placed in mail today.

## 2018-05-28 ENCOUNTER — Other Ambulatory Visit: Payer: Self-pay | Admitting: Radiology

## 2018-05-28 ENCOUNTER — Other Ambulatory Visit: Payer: Managed Care, Other (non HMO)

## 2018-05-28 ENCOUNTER — Encounter
Admission: RE | Admit: 2018-05-28 | Discharge: 2018-05-28 | Disposition: A | Discharge status: Home / Self Care | Payer: Managed Care, Other (non HMO) | Source: Ambulatory Visit | Attending: Urology | Admitting: Urology

## 2018-05-28 ENCOUNTER — Other Ambulatory Visit: Payer: Self-pay

## 2018-05-28 ENCOUNTER — Encounter: Payer: Self-pay | Admitting: Urology

## 2018-05-28 DIAGNOSIS — I10 Essential (primary) hypertension: Secondary | ICD-10-CM

## 2018-05-28 DIAGNOSIS — Z01810 Encounter for preprocedural cardiovascular examination: Secondary | ICD-10-CM | POA: Insufficient documentation

## 2018-05-28 DIAGNOSIS — Z6838 Body mass index (BMI) 38.0-38.9, adult: Secondary | ICD-10-CM | POA: Diagnosis not present

## 2018-05-28 DIAGNOSIS — Z87891 Personal history of nicotine dependence: Secondary | ICD-10-CM | POA: Diagnosis not present

## 2018-05-28 DIAGNOSIS — Z7984 Long term (current) use of oral hypoglycemic drugs: Secondary | ICD-10-CM | POA: Diagnosis not present

## 2018-05-28 DIAGNOSIS — N200 Calculus of kidney: Secondary | ICD-10-CM | POA: Diagnosis present

## 2018-05-28 DIAGNOSIS — J449 Chronic obstructive pulmonary disease, unspecified: Secondary | ICD-10-CM | POA: Diagnosis not present

## 2018-05-28 DIAGNOSIS — Z86711 Personal history of pulmonary embolism: Secondary | ICD-10-CM | POA: Diagnosis not present

## 2018-05-28 DIAGNOSIS — F419 Anxiety disorder, unspecified: Secondary | ICD-10-CM | POA: Diagnosis not present

## 2018-05-28 DIAGNOSIS — Z8614 Personal history of Methicillin resistant Staphylococcus aureus infection: Secondary | ICD-10-CM | POA: Diagnosis not present

## 2018-05-28 DIAGNOSIS — E119 Type 2 diabetes mellitus without complications: Secondary | ICD-10-CM | POA: Insufficient documentation

## 2018-05-28 DIAGNOSIS — K449 Diaphragmatic hernia without obstruction or gangrene: Secondary | ICD-10-CM | POA: Diagnosis not present

## 2018-05-28 DIAGNOSIS — E669 Obesity, unspecified: Secondary | ICD-10-CM | POA: Diagnosis not present

## 2018-05-28 DIAGNOSIS — K219 Gastro-esophageal reflux disease without esophagitis: Secondary | ICD-10-CM | POA: Diagnosis not present

## 2018-05-28 DIAGNOSIS — F329 Major depressive disorder, single episode, unspecified: Secondary | ICD-10-CM | POA: Diagnosis not present

## 2018-05-28 DIAGNOSIS — Z8619 Personal history of other infectious and parasitic diseases: Secondary | ICD-10-CM | POA: Diagnosis not present

## 2018-05-28 DIAGNOSIS — R42 Dizziness and giddiness: Secondary | ICD-10-CM | POA: Diagnosis not present

## 2018-05-28 DIAGNOSIS — Z87442 Personal history of urinary calculi: Secondary | ICD-10-CM | POA: Diagnosis not present

## 2018-05-28 LAB — URINALYSIS, COMPLETE
Bilirubin, UA: NEGATIVE
Glucose, UA: NEGATIVE
Ketones, UA: NEGATIVE
LEUKOCYTES UA: NEGATIVE
Nitrite, UA: NEGATIVE
Protein, UA: NEGATIVE
RBC, UA: NEGATIVE
Specific Gravity, UA: >1.03 — ABNORMAL HIGH (ref 1.005–1.030)
Urobilinogen, Ur: 0.2 mg/dL (ref 0.2–1.0)
pH, UA: 5.5 (ref 5.0–7.5)

## 2018-05-28 LAB — MICROSCOPIC EXAMINATION
RBC, UA: NONE SEEN /hpf (ref 0–2)
RENAL EPITHEL UA: NONE SEEN /HPF
WBC, UA: NONE SEEN /hpf (ref 0–5)

## 2018-05-28 MED ORDER — HYDROCODONE-ACETAMINOPHEN 5-325 MG PO TABS: 1.0000 | ORAL_TABLET | Freq: Four times a day (QID) | ORAL | 0 refills | Status: AC | PRN

## 2018-05-28 NOTE — Pre-Procedure Instructions (Signed)
Copy and pasted note from ED visit 11/26/2017  EKG  ED ECG REPORT I, Eula Listen, the attending physician, personally viewed and interpreted this ECG.   Date: 11/26/2017  EKG Time: 1418  Rate: 77  Rhythm: normal sinus rhythm  Axis: leftward  Intervals:none  ST&T Change: Nonspecific T wave inversion in V1.  No STEMI.  Repeat EKG during an episode of "heart racing":   ED ECG REPORT I, Eula Listen, the attending physician, personally viewed and interpreted this ECG.   Date: 11/26/2017  EKG Time: 1717  Rate: 66  Rhythm: normal sinus rhythm  Axis: leftward  Intervals:none  ST&T Change: No STEMI; nonspecific twave inversion V1 and V2

## 2018-05-28 NOTE — Addendum Note (Signed)
Addended by: Billey Co on: 05/28/2018 03:44 PM   Modules accepted: Orders

## 2018-05-28 NOTE — Patient Instructions (Signed)
Your procedure is scheduled on: Friday, May 31, 2018 Report to Day Surgery on the 2nd floor of the [facility]. To find out your arrival time, please call [phone] between 1PM - 3PM on: Thursday, May 30, 2018  REMEMBER: Instructions that are not followed completely may result in serious medical risk, up to and including death; or upon the discretion of your surgeon and anesthesiologist your surgery may need to be rescheduled.  Do not eat food after midnight the night before surgery.  No gum chewing, lozengers or hard candies.  You may however, drink water up to 2 hours before you are scheduled to arrive for your surgery. Do not drink anything within 2 hours of the start of your surgery.  No Alcohol for 24 hours before or after surgery.  No Smoking including e-cigarettes for 24 hours prior to surgery.  No chewable tobacco products for at least 6 hours prior to surgery.  No nicotine patches on the day of surgery.  On the morning of surgery brush your teeth with toothpaste and water, you may rinse your mouth with mouthwash if you wish. Do not swallow any toothpaste or mouthwash.  Notify your doctor if there is any change in your medical condition (cold, fever, infection).  Do not wear jewelry, make-up, hairpins, clips or nail polish.  Do not wear lotions, powders, or perfumes.   Do not shave 48 hours prior to surgery.   Contacts and dentures may not be worn into surgery.  Do not bring valuables to the hospital, including drivers license, insurance or credit cards.  [facility] is not responsible for any belongings or valuables.   TAKE THESE MEDICATIONS THE MORNING OF SURGERY:  1.  Albuterol inhaler 2.  Buspirone 3.  flovent inhaler 4.  Prevacid (take one the night before and one on the morning of surgery - helps to prevent nausea after surgery.) 5.  Sertraline  Use inhalers on the day of surgery and bring to the hospital.  Bring your C-PAP to the hospital with  you in case you may have to spend the night.   Stop Metformin 2 days prior to surgery. Last day to take is today (January 7); resume after surgery.  NOW!  Stop Anti-inflammatories (NSAIDS) such as Advil, Aleve, Ibuprofen, Motrin, Naproxen, Naprosyn and Aspirin based products such as Excedrin, Goodys Powder, BC Powder. (May take Tylenol or Acetaminophen if needed.)  NOW!  Stop ANY OVER THE COUNTER supplements until after surgery.  If you are being discharged the day of surgery, you will not be allowed to drive home. You will need a responsible adult to drive you home and stay with you that night.   If you are taking public transportation, you will need to have a responsible adult with you. Please confirm with your physician that it is acceptable to use public transportation.   Please call [phone] if you have any questions about these instructions.

## 2018-05-30 MED ORDER — CEFAZOLIN SODIUM-DEXTROSE 2-4 GM/100ML-% IV SOLN
2.0000 g | Freq: Once | INTRAVENOUS | Status: AC
  Administered 2018-05-31: 2 g via INTRAVENOUS

## 2018-05-31 ENCOUNTER — Ambulatory Visit: Payer: Managed Care, Other (non HMO) | Admitting: Certified Registered"

## 2018-05-31 ENCOUNTER — Encounter
Admission: RE | Disposition: A | Discharge status: Home / Self Care | Payer: Self-pay | Source: Home / Self Care | Attending: Urology

## 2018-05-31 ENCOUNTER — Encounter: Payer: Self-pay | Admitting: Urology

## 2018-05-31 ENCOUNTER — Ambulatory Visit
Admission: RE | Admit: 2018-05-31 | Discharge: 2018-05-31 | Disposition: A | Discharge status: Home / Self Care | Payer: Managed Care, Other (non HMO) | Attending: Urology | Admitting: Urology

## 2018-05-31 DIAGNOSIS — R42 Dizziness and giddiness: Secondary | ICD-10-CM | POA: Insufficient documentation

## 2018-05-31 DIAGNOSIS — Z7984 Long term (current) use of oral hypoglycemic drugs: Secondary | ICD-10-CM | POA: Insufficient documentation

## 2018-05-31 DIAGNOSIS — Z87442 Personal history of urinary calculi: Secondary | ICD-10-CM | POA: Insufficient documentation

## 2018-05-31 DIAGNOSIS — Z8614 Personal history of Methicillin resistant Staphylococcus aureus infection: Secondary | ICD-10-CM | POA: Insufficient documentation

## 2018-05-31 DIAGNOSIS — Z87891 Personal history of nicotine dependence: Secondary | ICD-10-CM | POA: Insufficient documentation

## 2018-05-31 DIAGNOSIS — E119 Type 2 diabetes mellitus without complications: Secondary | ICD-10-CM | POA: Insufficient documentation

## 2018-05-31 DIAGNOSIS — Z6838 Body mass index (BMI) 38.0-38.9, adult: Secondary | ICD-10-CM | POA: Insufficient documentation

## 2018-05-31 DIAGNOSIS — Z8619 Personal history of other infectious and parasitic diseases: Secondary | ICD-10-CM | POA: Insufficient documentation

## 2018-05-31 DIAGNOSIS — N200 Calculus of kidney: Secondary | ICD-10-CM | POA: Insufficient documentation

## 2018-05-31 DIAGNOSIS — J449 Chronic obstructive pulmonary disease, unspecified: Secondary | ICD-10-CM | POA: Insufficient documentation

## 2018-05-31 DIAGNOSIS — F419 Anxiety disorder, unspecified: Secondary | ICD-10-CM | POA: Insufficient documentation

## 2018-05-31 DIAGNOSIS — Z86711 Personal history of pulmonary embolism: Secondary | ICD-10-CM | POA: Insufficient documentation

## 2018-05-31 DIAGNOSIS — I10 Essential (primary) hypertension: Secondary | ICD-10-CM | POA: Insufficient documentation

## 2018-05-31 DIAGNOSIS — K219 Gastro-esophageal reflux disease without esophagitis: Secondary | ICD-10-CM | POA: Insufficient documentation

## 2018-05-31 DIAGNOSIS — K449 Diaphragmatic hernia without obstruction or gangrene: Secondary | ICD-10-CM | POA: Insufficient documentation

## 2018-05-31 DIAGNOSIS — E669 Obesity, unspecified: Secondary | ICD-10-CM | POA: Insufficient documentation

## 2018-05-31 DIAGNOSIS — F329 Major depressive disorder, single episode, unspecified: Secondary | ICD-10-CM | POA: Insufficient documentation

## 2018-05-31 HISTORY — PX: CYSTOSCOPY WITH URETEROSCOPY AND STENT PLACEMENT: SHX6377

## 2018-05-31 LAB — GLUCOSE, CAPILLARY
Glucose-Capillary: 173 mg/dL — ABNORMAL HIGH (ref 70–99)
Glucose-Capillary: 171 mg/dL — ABNORMAL HIGH (ref 70–99)

## 2018-05-31 SURGERY — CYSTOURETEROSCOPY, WITH STENT INSERTION
Anesthesia: General | Laterality: Right

## 2018-05-31 MED ORDER — OXYBUTYNIN CHLORIDE ER 10 MG PO TB24
10.0000 mg | ORAL_TABLET | Freq: Every day | ORAL | Status: DC
  Administered 2018-05-31: 10 mg via ORAL
  Filled 2018-05-31: qty 1

## 2018-05-31 MED ORDER — ONDANSETRON HCL 4 MG/2ML IJ SOLN
INTRAMUSCULAR | Status: AC
  Filled 2018-05-31: qty 2

## 2018-05-31 MED ORDER — PROMETHAZINE HCL 25 MG/ML IJ SOLN: 6.2500 mg | INTRAMUSCULAR | Status: DC | PRN

## 2018-05-31 MED ORDER — ESMOLOL HCL 100 MG/10ML IV SOLN
INTRAVENOUS | Status: DC | PRN
  Administered 2018-05-31: 30 mg via INTRAVENOUS

## 2018-05-31 MED ORDER — PROPOFOL 10 MG/ML IV BOLUS
INTRAVENOUS | Status: DC | PRN
  Administered 2018-05-31: 200 mg via INTRAVENOUS

## 2018-05-31 MED ORDER — ESMOLOL HCL 100 MG/10ML IV SOLN: INTRAVENOUS | Status: DC | PRN

## 2018-05-31 MED ORDER — SCOPOLAMINE 1 MG/3DAYS TD PT72
1.0000 | MEDICATED_PATCH | TRANSDERMAL | Status: DC
  Administered 2018-05-31: 1.5 mg via TRANSDERMAL

## 2018-05-31 MED ORDER — DEXAMETHASONE SODIUM PHOSPHATE 10 MG/ML IJ SOLN
INTRAMUSCULAR | Status: AC
  Filled 2018-05-31: qty 1

## 2018-05-31 MED ORDER — SCOPOLAMINE 1 MG/3DAYS TD PT72
MEDICATED_PATCH | TRANSDERMAL | Status: AC
  Administered 2018-05-31: 1.5 mg via TRANSDERMAL
  Filled 2018-05-31: qty 1

## 2018-05-31 MED ORDER — LACTATED RINGERS IV SOLN
INTRAVENOUS | Status: DC | PRN
  Administered 2018-05-31: 09:00:00 via INTRAVENOUS

## 2018-05-31 MED ORDER — MIDAZOLAM HCL 2 MG/2ML IJ SOLN
INTRAMUSCULAR | Status: DC | PRN
  Administered 2018-05-31: 2 mg via INTRAVENOUS

## 2018-05-31 MED ORDER — LIDOCAINE HCL (CARDIAC) PF 100 MG/5ML IV SOSY
PREFILLED_SYRINGE | INTRAVENOUS | Status: DC | PRN
  Administered 2018-05-31: 100 mg via INTRAVENOUS

## 2018-05-31 MED ORDER — PHENYLEPHRINE HCL 10 MG/ML IJ SOLN
INTRAMUSCULAR | Status: DC | PRN
  Administered 2018-05-31: 100 ug via INTRAVENOUS

## 2018-05-31 MED ORDER — SUGAMMADEX SODIUM 500 MG/5ML IV SOLN
INTRAVENOUS | Status: DC | PRN
  Administered 2018-05-31: 400 mg via INTRAVENOUS

## 2018-05-31 MED ORDER — OXYCODONE HCL 5 MG/5ML PO SOLN: 5.0000 mg | Freq: Once | ORAL | Status: DC | PRN

## 2018-05-31 MED ORDER — DEXAMETHASONE SODIUM PHOSPHATE 10 MG/ML IJ SOLN
INTRAMUSCULAR | Status: DC | PRN
  Administered 2018-05-31: 10 mg via INTRAVENOUS

## 2018-05-31 MED ORDER — MEPERIDINE HCL 50 MG/ML IJ SOLN: 6.2500 mg | INTRAMUSCULAR | Status: DC | PRN

## 2018-05-31 MED ORDER — NITROFURANTOIN MACROCRYSTAL 100 MG PO CAPS: 100.0000 mg | ORAL_CAPSULE | Freq: Every day | ORAL | 0 refills | Status: AC

## 2018-05-31 MED ORDER — OXYBUTYNIN CHLORIDE ER 10 MG PO TB24: 10.0000 mg | ORAL_TABLET | Freq: Every day | ORAL | 0 refills | Status: AC

## 2018-05-31 MED ORDER — SUGAMMADEX SODIUM 500 MG/5ML IV SOLN
INTRAVENOUS | Status: AC
  Filled 2018-05-31: qty 5

## 2018-05-31 MED ORDER — ROCURONIUM BROMIDE 100 MG/10ML IV SOLN
INTRAVENOUS | Status: DC | PRN
  Administered 2018-05-31: 50 mg via INTRAVENOUS

## 2018-05-31 MED ORDER — FENTANYL CITRATE (PF) 100 MCG/2ML IJ SOLN: 25.0000 ug | INTRAMUSCULAR | Status: DC | PRN

## 2018-05-31 MED ORDER — HEPARIN SODIUM (PORCINE) 1000 UNIT/ML IJ SOLN
INTRAMUSCULAR | Status: DC | PRN
  Administered 2018-05-31: 5000 [IU] via INTRAVENOUS

## 2018-05-31 MED ORDER — OXYCODONE HCL 5 MG PO TABS: 5.0000 mg | ORAL_TABLET | Freq: Once | ORAL | Status: DC | PRN

## 2018-05-31 MED ORDER — PROPOFOL 10 MG/ML IV BOLUS
INTRAVENOUS | Status: AC
  Filled 2018-05-31: qty 20

## 2018-05-31 MED ORDER — IOPAMIDOL (ISOVUE-M 200) INJECTION 41%
INTRAMUSCULAR | Status: DC | PRN
  Administered 2018-05-31: 5 mL

## 2018-05-31 MED ORDER — CEFAZOLIN SODIUM-DEXTROSE 2-4 GM/100ML-% IV SOLN
INTRAVENOUS | Status: AC
  Filled 2018-05-31: qty 100

## 2018-05-31 MED ORDER — TAMSULOSIN HCL 0.4 MG PO CAPS: 0.4000 mg | ORAL_CAPSULE | Freq: Every day | ORAL | 0 refills | Status: DC

## 2018-05-31 MED ORDER — MIDAZOLAM HCL 2 MG/2ML IJ SOLN
INTRAMUSCULAR | Status: AC
  Filled 2018-05-31: qty 2

## 2018-05-31 MED ORDER — SODIUM CHLORIDE 0.9 % IV SOLN
INTRAVENOUS | Status: DC
  Administered 2018-05-31: 09:00:00 via INTRAVENOUS

## 2018-05-31 MED ORDER — FENTANYL CITRATE (PF) 100 MCG/2ML IJ SOLN
INTRAMUSCULAR | Status: DC | PRN
  Administered 2018-05-31 (×2): 50 ug via INTRAVENOUS

## 2018-05-31 MED ORDER — ONDANSETRON HCL 4 MG/2ML IJ SOLN
INTRAMUSCULAR | Status: DC | PRN
  Administered 2018-05-31: 4 mg via INTRAVENOUS

## 2018-05-31 MED ORDER — LIDOCAINE HCL (PF) 2 % IJ SOLN
INTRAMUSCULAR | Status: AC
  Filled 2018-05-31: qty 10

## 2018-05-31 MED ORDER — ROCURONIUM BROMIDE 50 MG/5ML IV SOLN
INTRAVENOUS | Status: AC
  Filled 2018-05-31: qty 1

## 2018-05-31 MED ORDER — FENTANYL CITRATE (PF) 100 MCG/2ML IJ SOLN
INTRAMUSCULAR | Status: AC
  Filled 2018-05-31: qty 2

## 2018-05-31 SURGICAL SUPPLY — 34 items
BAG DRAIN CYSTO-URO LG1000N (MISCELLANEOUS) ×3 IMPLANT
BRUSH SCRUB EZ 1% IODOPHOR (MISCELLANEOUS) ×3 IMPLANT
BULB IRRIG PATHFIND (MISCELLANEOUS) IMPLANT
CATH URETL 5X70 OPEN END (CATHETERS) IMPLANT
CNTNR SPEC 2.5X3XGRAD LEK (MISCELLANEOUS)
CONT SPEC 4OZ STER OR WHT (MISCELLANEOUS)
CONT SPEC 4OZ STRL OR WHT (MISCELLANEOUS)
CONTAINER SPEC 2.5X3XGRAD LEK (MISCELLANEOUS) IMPLANT
DRAPE UTILITY 15X26 TOWEL STRL (DRAPES) ×3 IMPLANT
FIBER LASER LITHO 273 (Laser) ×2 IMPLANT
GLOVE BIOGEL PI IND STRL 7.5 (GLOVE) ×1 IMPLANT
GLOVE BIOGEL PI INDICATOR 7.5 (GLOVE) ×2
GOWN STRL REUS W/ TWL LRG LVL3 (GOWN DISPOSABLE) ×1 IMPLANT
GOWN STRL REUS W/ TWL XL LVL3 (GOWN DISPOSABLE) ×1 IMPLANT
GOWN STRL REUS W/TWL LRG LVL3 (GOWN DISPOSABLE) ×3
GOWN STRL REUS W/TWL XL LVL3 (GOWN DISPOSABLE) ×3
INFUSOR MANOMETER BAG 3000ML (MISCELLANEOUS) ×3 IMPLANT
INTRODUCER DILATOR DOUBLE (INTRODUCER) ×2 IMPLANT
KIT TURNOVER CYSTO (KITS) ×3 IMPLANT
PACK CYSTO AR (MISCELLANEOUS) ×3 IMPLANT
SENSORWIRE 0.038 NOT ANGLED (WIRE) ×3
SET CYSTO W/LG BORE CLAMP LF (SET/KITS/TRAYS/PACK) ×3 IMPLANT
SHEATH URETERAL 12FRX35CM (MISCELLANEOUS) IMPLANT
SOL .9 NS 3000ML IRR  AL (IV SOLUTION) ×2
SOL .9 NS 3000ML IRR AL (IV SOLUTION) ×1
SOL .9 NS 3000ML IRR UROMATIC (IV SOLUTION) ×1 IMPLANT
STENT URET 6FRX24 CONTOUR (STENTS) IMPLANT
STENT URET 6FRX26 CONTOUR (STENTS) ×2 IMPLANT
SURGILUBE 2OZ TUBE FLIPTOP (MISCELLANEOUS) ×3 IMPLANT
SYR 10ML LL (SYRINGE) ×3 IMPLANT
TUBING ART PRESS 48 MALE/FEM (TUBING) IMPLANT
VALVE UROSEAL ADJ ENDO (VALVE) ×2 IMPLANT
WATER STERILE IRR 1000ML POUR (IV SOLUTION) ×3 IMPLANT
WIRE SENSOR 0.038 NOT ANGLED (WIRE) ×1 IMPLANT

## 2018-05-31 NOTE — Anesthesia Post-op Follow-up Note (Signed)
Anesthesia QCDR form completed.        

## 2018-05-31 NOTE — Anesthesia Preprocedure Evaluation (Signed)
Anesthesia Evaluation  Patient identified by MRN, date of birth, ID band Patient awake    Reviewed: Allergy & Precautions, NPO status , Patient's Chart, lab work & pertinent test results  History of Anesthesia Complications (+) PONV and history of anesthetic complications  Airway Mallampati: III  TM Distance: >3 FB Neck ROM: Full    Dental  (+) Poor Dentition   Pulmonary asthma , sleep apnea and Continuous Positive Airway Pressure Ventilation , COPD,  COPD inhaler, former smoker,    breath sounds clear to auscultation- rhonchi (-) wheezing      Cardiovascular hypertension, Pt. on medications + CAD  (-) Past MI, (-) Cardiac Stents and (-) CABG  Rhythm:Regular Rate:Normal - Systolic murmurs and - Diastolic murmurs    Neuro/Psych neg Seizures PSYCHIATRIC DISORDERS Anxiety Depression negative neurological ROS     GI/Hepatic Neg liver ROS, hiatal hernia, GERD  ,  Endo/Other  diabetes, Oral Hypoglycemic Agents  Renal/GU negative Renal ROS     Musculoskeletal negative musculoskeletal ROS (+)   Abdominal (+) + obese,   Peds  Hematology negative hematology ROS (+)   Anesthesia Other Findings Past Medical History: No date: Anxiety No date: Asthma No date: Complication of anesthesia No date: COPD (chronic obstructive pulmonary disease) (HCC)     Comment:  diagnosed from pulmonary function tests No date: Depression No date: Diabetes mellitus without complication ([place]) 9983: Dvt femoral (deep venous thrombosis) (HCC)     Comment:  in lung also.  treated with xarelto for 6 months No date: GERD (gastroesophageal reflux disease) No date: Hepatitis     Comment:  AGE 63-HEPATITIS B No date: History of hiatal hernia 2019: History of kidney stones 2013: History of methicillin resistant staphylococcus aureus (MRSA)     Comment:  in neck due to ingrown hair. had surgery to drain cyst 2019: History of palpitations     Comment:   due to a panic attack No date: Hypertension 2019: Lung nodule     Comment:  being followed by dr. Grayland Ormond, dr Genevive Bi and dr. Mortimer Fries 04/2016: Pneumonia No date: PONV (postoperative nausea and vomiting)     Comment:  NAUSEATED 2018: Pulmonary emboli ([place]) No date: Sleep apnea     Comment:  CPAP No date: Vertigo   Reproductive/Obstetrics                             Anesthesia Physical Anesthesia Plan  ASA: III  Anesthesia Plan: General   Post-op Pain Management:    Induction: Intravenous  PONV Risk Score and Plan: 3 and Ondansetron, Dexamethasone and Midazolam  Airway Management Planned: Oral ETT  Additional Equipment:   Intra-op Plan:   Post-operative Plan: Extubation in OR  Informed Consent: I have reviewed the patients History and Physical, chart, labs and discussed the procedure including the risks, benefits and alternatives for the proposed anesthesia with the patient or authorized representative who has indicated his/her understanding and acceptance.   Dental advisory given  Plan Discussed with: CRNA and Anesthesiologist  Anesthesia Plan Comments:         Anesthesia Quick Evaluation

## 2018-05-31 NOTE — Anesthesia Procedure Notes (Addendum)
Procedure Name: Intubation Date/Time: 05/31/2018 9:17 AM Performed by: Kelton Pillar, CRNA Pre-anesthesia Checklist: Patient identified, Suction available, Emergency Drugs available and Patient being monitored Patient Re-evaluated:Patient Re-evaluated prior to induction Oxygen Delivery Method: Circle system utilized Preoxygenation: Pre-oxygenation with 100% oxygen Induction Type: IV induction Ventilation: Oral airway inserted - appropriate to patient size Laryngoscope Size: McGraph and 3 Grade View: Grade I Tube type: Oral Tube size: 7.0 mm Number of attempts: 1 Airway Equipment and Method: Stylet Placement Confirmation: ETT inserted through vocal cords under direct vision,  positive ETCO2,  CO2 detector and breath sounds checked- equal and bilateral Secured at: 22 cm Tube secured with: Tape

## 2018-05-31 NOTE — H&P (Signed)
UROLOGY H&P UPDATE  Agree with prior H&P dated 05/23/2018. Right 70mm renal stone with flank pain.  Cardiac: RRR Lungs: CTA bilaterally  Laterality: RIGHT Procedure: RIGHT URS/LL/STENT  Urinalysis: Urine cx 12/26 no growth (outside culture)  Informed consent obtained, we specifically discussed the risk of bleeding, infection, need for additional procedures, stent related symptoms, need for staged procedure, and need for stent removal.  Billey Co, MD 05/31/2018

## 2018-05-31 NOTE — Anesthesia Postprocedure Evaluation (Signed)
Anesthesia Post Note  Patient: Andrea Pollard  Procedure(s) Performed: CYSTOSCOPY WITH URETEROSCOPY, LASER LITHOTRIPSY AND STENT PLACEMENT (Right )  Patient location during evaluation: PACU Anesthesia Type: General Level of consciousness: awake and alert and oriented Pain management: pain level controlled Vital Signs Assessment: post-procedure vital signs reviewed and stable Respiratory status: spontaneous breathing, nonlabored ventilation and respiratory function stable Cardiovascular status: blood pressure returned to baseline and stable Postop Assessment: no signs of nausea or vomiting Anesthetic complications: no     Last Vitals:  Vitals:   05/31/18 1026 05/31/18 1051  BP: 114/72 135/83  Pulse: 79 71  Resp: 15 16  Temp: 36.9 C (!) 36.2 C  SpO2: 92% 96%    Last Pain:  Vitals:   05/31/18 1051  TempSrc: Temporal  PainSc: 6                  [name]

## 2018-05-31 NOTE — Discharge Instructions (Signed)

## 2018-05-31 NOTE — Transfer of Care (Signed)
Immediate Anesthesia Transfer of Care Note  Patient: Andrea Pollard  Procedure(s) Performed: CYSTOSCOPY WITH URETEROSCOPY, LASER LITHOTRIPSY AND STENT PLACEMENT (Right )  Patient Location: PACU  Anesthesia Type:General  Level of Consciousness: awake, alert , oriented and patient cooperative  Airway & Oxygen Therapy: Patient Spontanous Breathing and Patient connected to face mask oxygen  Post-op Assessment: Report given to RN and Post -op Vital signs reviewed and stable  Post vital signs: Reviewed and stable  Last Vitals:  Vitals Value Taken Time  BP 145/76 05/31/2018  9:56 AM  Temp 36.4 C 05/31/2018  9:56 AM  Pulse 79 05/31/2018 10:00 AM  Resp 24 05/31/2018 10:00 AM  SpO2 99 % 05/31/2018 10:00 AM  Vitals shown include unvalidated device data.  Last Pain:  Vitals:   05/31/18 0956  TempSrc:   PainSc: 0-No pain         Complications: No apparent anesthesia complications

## 2018-05-31 NOTE — Op Note (Signed)
Date of procedure: 05/31/18  Preoperative diagnosis:  1. Right renal pelvis stone, 7 mm  Postoperative diagnosis:  1. Same  Procedure: 1. Cystoscopy, right ureteroscopy, laser lithotripsy, right retrograde pyelogram, stent placement  Surgeon: Nickolas Madrid, MD  Anesthesia: General  Complications: None  Intraoperative findings:  1.  Normal cystoscopy 2.  Uncomplicated dusting of right 7 mm renal pelvis stone 3.  6 French by 26 cm stent  on Dangler  EBL: Minimal  Specimens: None  Drains: Right 63F X 26 cm ureteral stent on Dangler  Indication: Andrea Pollard is a 63 y.o. patient with intermittent right renal colic and a 7 mm right renal pelvis stone.  After reviewing the management options for treatment, they elected to proceed with the above surgical procedure(s). We have discussed the potential benefits and risks of the procedure, side effects of the proposed treatment, the likelihood of the patient achieving the goals of the procedure, and any potential problems that might occur during the procedure or recuperation. Informed consent has been obtained.  Description of procedure:  The patient was taken to the operating room and general anesthesia was induced.  The patient was placed in the dorsal lithotomy position, prepped and draped in the usual sterile fashion, and preoperative antibiotics(Ancef) were administered. A preoperative time-out was performed.   A 21 French rigid cystoscope was used to intubate the urethra.  Cystoscopy was grossly normal.  The ureteral orifices were orthotopic bilaterally.  A sensor wire was advanced into the right ureteral orifice up to the collecting system under fluoroscopic vision.  We attempted to pass the single-channel flexible ureteroscope however met resistance at the distal ureter.  A dual-lumen access catheter was used to gently dilate the distal and mid ureter.  The flexible ureteroscope was then advanced easily over the wire up to the  collecting system.  We immediately identified a 7 mm black stone in the renal pelvis, and this was dusted to sub-1 mm fragments using a 270 m laser fiber on settings of 0.5 J and 30 Hz.  Thorough pyeloscopy revealed no other residual fragments.  A retrograde pyelogram was performed to aid in stent placement and showed no residual filling defects.  The sensor wire was replaced through the ureteroscope, and pullback ureteroscopy demonstrated no ureteral fragments.  A 6 French by 26 cm ureteral stent with Dangler was placed fluoroscopically over the wire with an excellent curl in the upper pole.  On cystoscopy there was a good curl in the bladder, and the bladder was drained.  Disposition: Stable to PACU  Plan: Remove stent at home on Monday, January 13 Nitrofurantoin prophylaxis while stent in place Follow-up in 6 weeks for renal ultrasound  Nickolas Madrid, MD

## 2018-06-03 ENCOUNTER — Telehealth: Payer: Self-pay

## 2018-06-03 ENCOUNTER — Telehealth: Payer: Self-pay | Admitting: Urology

## 2018-06-03 LAB — CULTURE, URINE COMPREHENSIVE

## 2018-06-03 NOTE — Telephone Encounter (Signed)
Made app and mailed to patient   Andrea Pollard

## 2018-06-03 NOTE — Telephone Encounter (Signed)
-----   Message from Billey Co, MD sent at 05/31/2018  9:58 AM EST ----- Regarding: follow up Please schedule follow up in 6 weeks with renal ultrasound with me  Nickolas Madrid, MD 05/31/2018

## 2018-06-03 NOTE — Telephone Encounter (Signed)
.  mychart

## 2018-06-03 NOTE — Addendum Note (Signed)
Addended by: Billey Co on: 06/03/2018 11:44 AM   Modules accepted: Orders

## 2018-06-04 ENCOUNTER — Encounter: Payer: Self-pay | Admitting: Radiology

## 2018-06-04 ENCOUNTER — Telehealth: Payer: Self-pay

## 2018-06-04 NOTE — Telephone Encounter (Signed)
Called patient no answer could not leave vmail, sent another mychart message

## 2018-06-05 NOTE — Telephone Encounter (Signed)
Pt called office and I let her know we had paperwork at front desk for her and $20 would need to be paid.

## 2018-06-10 DIAGNOSIS — C678 Malignant neoplasm of overlapping sites of bladder: Secondary | ICD-10-CM

## 2018-07-04 ENCOUNTER — Telehealth: Payer: Self-pay | Admitting: Radiology

## 2018-07-04 NOTE — Telephone Encounter (Signed)
Unsuccessfully attempted to reach patient on both home & cell numbers. Unable to [facility] as vm has not been set up. Need to let patient know that FMLA paperwork has been left at front desk.

## 2018-07-16 ENCOUNTER — Ambulatory Visit: Payer: Managed Care, Other (non HMO) | Admitting: Urology

## 2018-07-18 ENCOUNTER — Ambulatory Visit: Payer: Managed Care, Other (non HMO)

## 2018-07-22 ENCOUNTER — Ambulatory Visit
Admission: RE | Admit: 2018-07-22 | Discharge: 2018-07-22 | Disposition: A | Discharge status: Home / Self Care | Payer: Managed Care, Other (non HMO) | Source: Ambulatory Visit | Attending: Oncology | Admitting: Oncology

## 2018-07-22 ENCOUNTER — Inpatient Hospital Stay: Payer: Managed Care, Other (non HMO) | Attending: Oncology | Admitting: Oncology

## 2018-07-22 ENCOUNTER — Other Ambulatory Visit: Payer: Self-pay

## 2018-07-22 ENCOUNTER — Encounter: Payer: Self-pay | Admitting: Oncology

## 2018-07-22 ENCOUNTER — Ambulatory Visit
Admission: RE | Admit: 2018-07-22 | Discharge: 2018-07-22 | Disposition: A | Discharge status: Home / Self Care | Payer: Managed Care, Other (non HMO) | Source: Ambulatory Visit | Attending: Urology | Admitting: Urology

## 2018-07-22 VITALS — BP 171/98 | HR 80 | Temp 97.8°F | Ht 66.0 in | Wt 228.6 lb

## 2018-07-22 DIAGNOSIS — Z7984 Long term (current) use of oral hypoglycemic drugs: Secondary | ICD-10-CM | POA: Diagnosis not present

## 2018-07-22 DIAGNOSIS — Z86718 Personal history of other venous thrombosis and embolism: Secondary | ICD-10-CM | POA: Insufficient documentation

## 2018-07-22 DIAGNOSIS — Z86711 Personal history of pulmonary embolism: Secondary | ICD-10-CM | POA: Diagnosis not present

## 2018-07-22 DIAGNOSIS — Z87891 Personal history of nicotine dependence: Secondary | ICD-10-CM | POA: Insufficient documentation

## 2018-07-22 DIAGNOSIS — Z79899 Other long term (current) drug therapy: Secondary | ICD-10-CM | POA: Insufficient documentation

## 2018-07-22 DIAGNOSIS — I10 Essential (primary) hypertension: Secondary | ICD-10-CM | POA: Diagnosis not present

## 2018-07-22 DIAGNOSIS — N200 Calculus of kidney: Secondary | ICD-10-CM

## 2018-07-22 DIAGNOSIS — R911 Solitary pulmonary nodule: Secondary | ICD-10-CM | POA: Diagnosis not present

## 2018-07-22 DIAGNOSIS — I2699 Other pulmonary embolism without acute cor pulmonale: Secondary | ICD-10-CM

## 2018-07-22 DIAGNOSIS — E119 Type 2 diabetes mellitus without complications: Secondary | ICD-10-CM | POA: Diagnosis not present

## 2018-07-22 DIAGNOSIS — G4730 Sleep apnea, unspecified: Secondary | ICD-10-CM | POA: Diagnosis not present

## 2018-07-22 DIAGNOSIS — R918 Other nonspecific abnormal finding of lung field: Secondary | ICD-10-CM

## 2018-07-22 DIAGNOSIS — J449 Chronic obstructive pulmonary disease, unspecified: Secondary | ICD-10-CM | POA: Insufficient documentation

## 2018-07-22 DIAGNOSIS — F419 Anxiety disorder, unspecified: Secondary | ICD-10-CM | POA: Diagnosis not present

## 2018-07-22 DIAGNOSIS — F329 Major depressive disorder, single episode, unspecified: Secondary | ICD-10-CM | POA: Diagnosis not present

## 2018-07-22 DIAGNOSIS — E041 Nontoxic single thyroid nodule: Secondary | ICD-10-CM | POA: Insufficient documentation

## 2018-07-22 LAB — POCT I-STAT CREATININE: Creatinine, Ser: 0.6 mg/dL (ref 0.44–1.00)

## 2018-07-22 MED ORDER — IOHEXOL 300 MG/ML  SOLN
75.0000 mL | Freq: Once | INTRAMUSCULAR | Status: AC | PRN
  Administered 2018-07-22: 75 mL via INTRAVENOUS

## 2018-07-22 NOTE — Progress Notes (Signed)
Patient is here today to follow up on her acute pulmonary embolism without acute cor pulmonale and to go over her CT Scan. Patient stated that she had been doing well.

## 2018-07-22 NOTE — Progress Notes (Signed)
Andrea Pollard  [phone] [phone]) [phone]  ID: [name] OB: 07/17/1955  MR#: [id number]  MWU#:[id number]  Patient Care Team: Ellamae Sia, MD as PCP - General (Internal Medicine) Minna Merritts, MD as Consulting Physician (Cardiology) Telford Nab, RN as Registered Nurse  CHIEF COMPLAINT: Mass of upper lobe of right lung.  INTERVAL HISTORY: Patient returns to clinic today for further evaluation and discussion of her imaging results.  She continues to be anxious, but otherwise feels well. She has no neurologic complaints. She denies any recent fevers or illnesses. She has a good appetite and denies weight loss. She she denies any shortness of breath, cough, chest pain, or hemoptysis. She denies any nausea, vomiting, constipation, or diarrhea. She has no urinary complaints.  Patient feels at her baseline offers no specific complaints today.  REVIEW OF SYSTEMS:   Review of Systems  Constitutional: Negative.  Negative for fever, malaise/fatigue and weight loss.  Respiratory: Negative.  Negative for cough, hemoptysis and shortness of breath.   Cardiovascular: Negative.  Negative for chest pain and leg swelling.  Gastrointestinal: Negative.  Negative for abdominal pain.  Genitourinary: Negative.  Negative for dysuria.  Musculoskeletal: Negative.  Negative for back pain.  Skin: Negative.  Negative for rash.  Neurological: Negative.  Negative for sensory change, focal weakness and weakness.  Psychiatric/Behavioral: The patient is nervous/anxious. The patient does not have insomnia.     As per HPI. Otherwise, a complete review of systems is negative.  PAST MEDICAL HISTORY: Past Medical History:  Diagnosis Date  . Anxiety   . Asthma   . Complication of anesthesia   . COPD (chronic obstructive pulmonary disease) (HCC)    diagnosed from pulmonary function tests  . Depression   . Diabetes mellitus without complication ([place])   . Dvt femoral  (deep venous thrombosis) ([place]) 2018   in lung also.  treated with xarelto for 6 months  . GERD (gastroesophageal reflux disease)   . Hepatitis    AGE 63-HEPATITIS B  . History of hiatal hernia   . History of kidney stones 2019  . History of methicillin resistant staphylococcus aureus (MRSA) 2013   in neck due to ingrown hair. had surgery to drain cyst  . History of palpitations 2019   due to a panic attack  . Hypertension   . Lung nodule 2019   being followed by dr. Grayland Ormond, dr Genevive Bi and dr. Mortimer Fries  . Pneumonia 04/2016  . PONV (postoperative nausea and vomiting)    NAUSEATED  . Pulmonary emboli ([place]) 2018  . Sleep apnea    CPAP  . Vertigo     PAST SURGICAL HISTORY: Past Surgical History:  Procedure Laterality Date  . ABDOMINAL HYSTERECTOMY  1994  . CESAREAN SECTION    . CHOLECYSTECTOMY  2013  . CYST REMOVAL NECK  2013   was ingrown hair . positive for mrsa at that time prior to removal in OR  . CYSTOSCOPY WITH URETEROSCOPY AND STENT PLACEMENT Right 05/31/2018   Procedure: CYSTOSCOPY WITH URETEROSCOPY, LASER LITHOTRIPSY AND STENT PLACEMENT;  Surgeon: Billey Co, MD;  Location: [facility] ORS;  Service: Urology;  Laterality: Right;  . ELECTROMAGNETIC NAVIGATION BROCHOSCOPY N/A 08/15/2016   Procedure: ELECTROMAGNETIC NAVIGATION BRONCHOSCOPY;  Surgeon: Flora Lipps, MD;  Location: [facility] ORS;  Service: Cardiopulmonary;  Laterality: N/A;  . ELECTROMAGNETIC NAVIGATION BROCHOSCOPY Right 11/07/2017   Procedure: ELECTROMAGNETIC NAVIGATION BRONCHOSCOPY;  Surgeon: Flora Lipps, MD;  Location: [facility] ORS;  Service: Cardiopulmonary;  Laterality: Right;  . LAPAROSCOPIC  GASTRIC SLEEVE RESECTION  2015   lost 75 pounds    FAMILY HISTORY: Family History  Problem Relation Age of Onset  . Stroke Father   . Ovarian cancer Maternal Grandmother   . Breast cancer Neg Hx     ADVANCED DIRECTIVES (Y/N):  N  HEALTH MAINTENANCE: Social History   Tobacco Use  . Smoking status: Former Smoker     Packs/day: 1.00    Years: 5.00    Pack years: 5.00    Types: Cigarettes    Last attempt to quit: 08/12/1991    Years since quitting: 26.9  . Smokeless tobacco: Never Used  Substance Use Topics  . Alcohol use: No  . Drug use: No     Colonoscopy:  PAP:  Bone density:  Lipid panel:  Allergies  Allergen Reactions  . Ibuprofen Other (See Comments)    Cannot Use due to Gastric SLEEVE SURGERY  . Levaquin [Levofloxacin In D5w] Hives  . Sulfamethoxazole-Trimethoprim Hives  . Nitroglycerin Other (See Comments)    Severe Hypotension    Current Outpatient Medications  Medication Sig Dispense Refill  . albuterol (PROAIR HFA) 108 (90 Base) MCG/ACT inhaler Inhale 2 Inhalers into the lungs as needed for wheezing or shortness of breath.     Marland Kitchen atorvastatin (LIPITOR) 80 MG tablet Take 80 mg by mouth at bedtime.   2  . busPIRone (BUSPAR) 5 MG tablet Take 5 mg by mouth 2 (two) times daily.     Marland Kitchen FLOVENT HFA 220 MCG/ACT inhaler Inhale 2 puffs into the lungs daily.   5  . hydrOXYzine (VISTARIL) 25 MG capsule Take 25-50 mg by mouth 3 (three) times daily as needed for anxiety.    . lansoprazole (PREVACID) 15 MG capsule Take 15 mg by mouth at bedtime.     Marland Kitchen lisinopril (PRINIVIL,ZESTRIL) 5 MG tablet Take 5 mg by mouth daily.  3  . metFORMIN (GLUCOPHAGE-XR) 500 MG 24 hr tablet Take 1,000 mg by mouth 2 (two) times daily.   2  . ONETOUCH VERIO test strip TEST ONCE D  3  . sertraline (ZOLOFT) 100 MG tablet Take 100 mg by mouth daily.  3   No current facility-administered medications for this visit.     OBJECTIVE: Vitals:   07/22/18 1338  BP: (!) 171/98  Pulse: 80  Temp: 97.8 F (36.6 C)     Body mass index is 36.9 kg/m.    ECOG FS:0 - Asymptomatic  General: Well-developed, well-nourished, no acute distress. Eyes: Pink conjunctiva, anicteric sclera. HEENT: Normocephalic, moist mucous membranes. Lungs: Clear to auscultation bilaterally. Heart: Regular rate and rhythm. No rubs, murmurs, or  gallops. Abdomen: Soft, nontender, nondistended. No organomegaly noted, normoactive bowel sounds. Musculoskeletal: No edema, cyanosis, or clubbing. Neuro: Alert, answering all questions appropriately. Cranial nerves grossly intact. Skin: No rashes or petechiae noted. Psych: Normal affect.  LAB RESULTS:  Lab Results  Component Value Date   NA 139 05/15/2018   K 3.7 05/15/2018   CL 104 05/15/2018   CO2 27 05/15/2018   GLUCOSE 247 (H) 05/15/2018   BUN 15 05/15/2018   CREATININE 0.60 07/22/2018   CALCIUM 9.0 05/15/2018   PROT 7.0 05/15/2018   ALBUMIN 3.9 05/15/2018   AST 14 (L) 05/15/2018   ALT 18 05/15/2018   ALKPHOS 89 05/15/2018   BILITOT 0.9 05/15/2018   GFRNONAA >60 05/15/2018   GFRAA >60 05/15/2018    Lab Results  Component Value Date   WBC 12.0 (H) 05/15/2018   NEUTROABS 5.6 05/29/2017  HGB 15.0 05/15/2018   HCT 44.6 05/15/2018   MCV 80.5 05/15/2018   PLT 239 05/15/2018     STUDIES: Ct Chest W Contrast  Result Date: 07/22/2018 CLINICAL DATA:  Six-month follow-up right lung nodule EXAM: CT CHEST WITH CONTRAST TECHNIQUE: Multidetector CT imaging of the chest was performed during intravenous contrast administration. CONTRAST:  43mL OMNIPAQUE IOHEXOL 300 MG/ML  SOLN COMPARISON:  02/12/2018 FINDINGS: Cardiovascular: Heart is normal in size.  No pericardial effusion. No evidence thoracic aortic aneurysm. Mild atherosclerotic calcifications of the aortic arch. Mild coronary sclerosis of the LAD and right coronary artery. Mediastinum/Nodes: No suspicious mediastinal lymphadenopathy. Visualized thyroid is notable for a 2.1 cm right thyroid nodule. Lungs/Pleura: 12 x 15 mm spiculated right upper lobe nodule (series 3/image 60), grossly unchanged. Scattered numerous bilateral pulmonary nodules, mildly progressive, including: --6 mm right upper lobe nodule, previously 4 mm --7 mm posterior right upper lobe nodule (series 3/image 51), previously 4 mm --7 mm left lower lobe nodule  (series 3/image 57), previously 4 mm Mild scarring/atelectasis inferiorly in the right upper lobe (series 3/image 69), unchanged, likely postobstructive. No pleural effusion or pneumothorax. Upper Abdomen: Visualized upper abdomen is notable for per surgical changes involving the stomach and prior cholecystectomy. Musculoskeletal: Degenerative changes of the visualized thoracolumbar spine. IMPRESSION: 12 x 15 mm spiculated right upper lobe nodule, suspicious for primary bronchogenic neoplasm, grossly unchanged. Numerous scattered bilateral pulmonary nodules, measuring up to 7 mm, mildly progressive and suspicious for metastatic disease. Aortic Atherosclerosis (ICD10-I70.0). Electronically Signed   By: Julian Hy M.D.   On: 07/22/2018 10:34   Ultrasound Renal Complete  Result Date: 07/22/2018 CLINICAL DATA:  Nephrolithiasis. Six week follow-up. Right ureteroscopy with laser lithotripsy 05/31/2018. EXAM: RENAL / URINARY TRACT ULTRASOUND COMPLETE COMPARISON:  CT 05/15/2018 and renal ultrasound 05/29/2017 FINDINGS: Right Kidney: Renal measurements: 4.2 x 5.4 x 11.1 cm = volume: 130 mL . Echogenicity within normal limits. No mass or hydronephrosis visualized. Left Kidney: Renal measurements: 5.0 x 6.2 x 10.8 cm = volume: 173 mL. Echogenicity within normal limits. No mass or hydronephrosis visualized. Bladder: Decompressed. IMPRESSION: Normal size kidneys without hydronephrosis or nephrolithiasis. Electronically Signed   By: Marin Olp M.D.   On: 07/22/2018 11:42    ASSESSMENT: Mass of upper lobe of right lung  PLAN:    1. Mass of upper lobe of right lung: Patient underwent biopsy on November 07, 2017 that was negative for malignancy.  Despite the negative biopsy, there was still concern for malignancy.  CT scan results from July 22, 2018 reviewed independently and reported as above with a 12 x 15 mm spiculated right upper lobe lung nodule highly suspicious for malignancy, but it is grossly unchanged from  6 months previous.  Patient also has scattered bilateral pulmonary nodules measuring up to 7 mm that are mildly progressive.  No intervention is needed at this time, patient acknowledges she may have to have repeat biopsy in the future.  Continue to monitor closely.  Return to clinic in 6 months with repeat imaging and further evaluation. Patient requests her imaging and evaluation occur on the same day given her increased anxiety.   2. Bilateral PE/left leg DVT: Resolved.  Patient is now completed anticoagulation.  Diagnosed by CT scan on November 28, 2016. Patient has no obvious transient risk factors. Hypercoagulable workup was negative except for an elevated DRVVT which can be attributed to the anticoagulation she is taking at that time.  I spent a total of 20 minutes face-to-face with the  patient of which greater than 50% of the visit was spent in counseling and coordination of care as detailed above.  Patient expressed understanding and was in agreement with this plan. She also understands that She can call clinic at any time with any questions, concerns, or complaints.   Cancer Staging No matching staging information was found for the patient.  Lloyd Huger, MD   07/24/2018 6:17 AM

## 2018-07-23 ENCOUNTER — Telehealth: Payer: Self-pay

## 2018-07-23 NOTE — Telephone Encounter (Signed)
Patient states she cannot make her follow up appointment on 3-5 for RUS results due to lack of copay. Would it be ok to give results over the phone or would you like her to reschedule?

## 2018-07-23 NOTE — Telephone Encounter (Signed)
No problem.  Ultrasound looks great, no residual stones and kidneys are draining well.  Follow-up in 1 year with KUB.  Please remind her stone prevention strategies, especially increased fluid intake with goal 2.5 L/day  Nickolas Madrid, MD 07/23/2018

## 2018-07-24 ENCOUNTER — Other Ambulatory Visit: Payer: Self-pay | Admitting: Internal Medicine

## 2018-07-24 DIAGNOSIS — Z1231 Encounter for screening mammogram for malignant neoplasm of breast: Secondary | ICD-10-CM

## 2018-07-25 ENCOUNTER — Ambulatory Visit: Payer: Managed Care, Other (non HMO) | Admitting: Urology

## 2018-07-29 ENCOUNTER — Other Ambulatory Visit: Payer: Self-pay | Admitting: Family Medicine

## 2018-07-30 ENCOUNTER — Telehealth: Payer: Self-pay | Admitting: Internal Medicine

## 2018-07-30 NOTE — Telephone Encounter (Signed)
Left message informing patient that Dr. Grayland Ormond order the Chest CT so she will need to call his office for the results.

## 2018-08-01 NOTE — Telephone Encounter (Signed)
Left VM for patient

## 2018-08-01 NOTE — Telephone Encounter (Signed)
Pt returned Dr. Zoila Shutter phone call but she stated that there is no need for him to call her back, she only wanted him to know that she got the test because he also cares for her. Nothing further needed

## 2018-08-06 ENCOUNTER — Ambulatory Visit
Admission: RE | Admit: 2018-08-06 | Discharge: 2018-08-06 | Disposition: A | Discharge status: Home / Self Care | Payer: Managed Care, Other (non HMO) | Source: Ambulatory Visit | Attending: Internal Medicine | Admitting: Internal Medicine

## 2018-08-06 ENCOUNTER — Other Ambulatory Visit: Payer: Self-pay

## 2018-08-06 DIAGNOSIS — Z1231 Encounter for screening mammogram for malignant neoplasm of breast: Secondary | ICD-10-CM | POA: Diagnosis present

## 2018-08-12 ENCOUNTER — Encounter: Payer: Self-pay | Admitting: Oncology

## 2018-08-19 IMAGING — CR DG FOOT COMPLETE 3+V*L*
1 series · 3 of 3 positions shown · non-contrast
Comparison: None.

CLINICAL DATA: 61-year-old female with a history of foot injury 2
days ago. Pain in the great toe

EXAM:
LEFT FOOT - COMPLETE 3+ VIEW

[Series 1: dg foot complete left · 0.14mm/px · 3 of 3 slices shown]
[im 1/3]
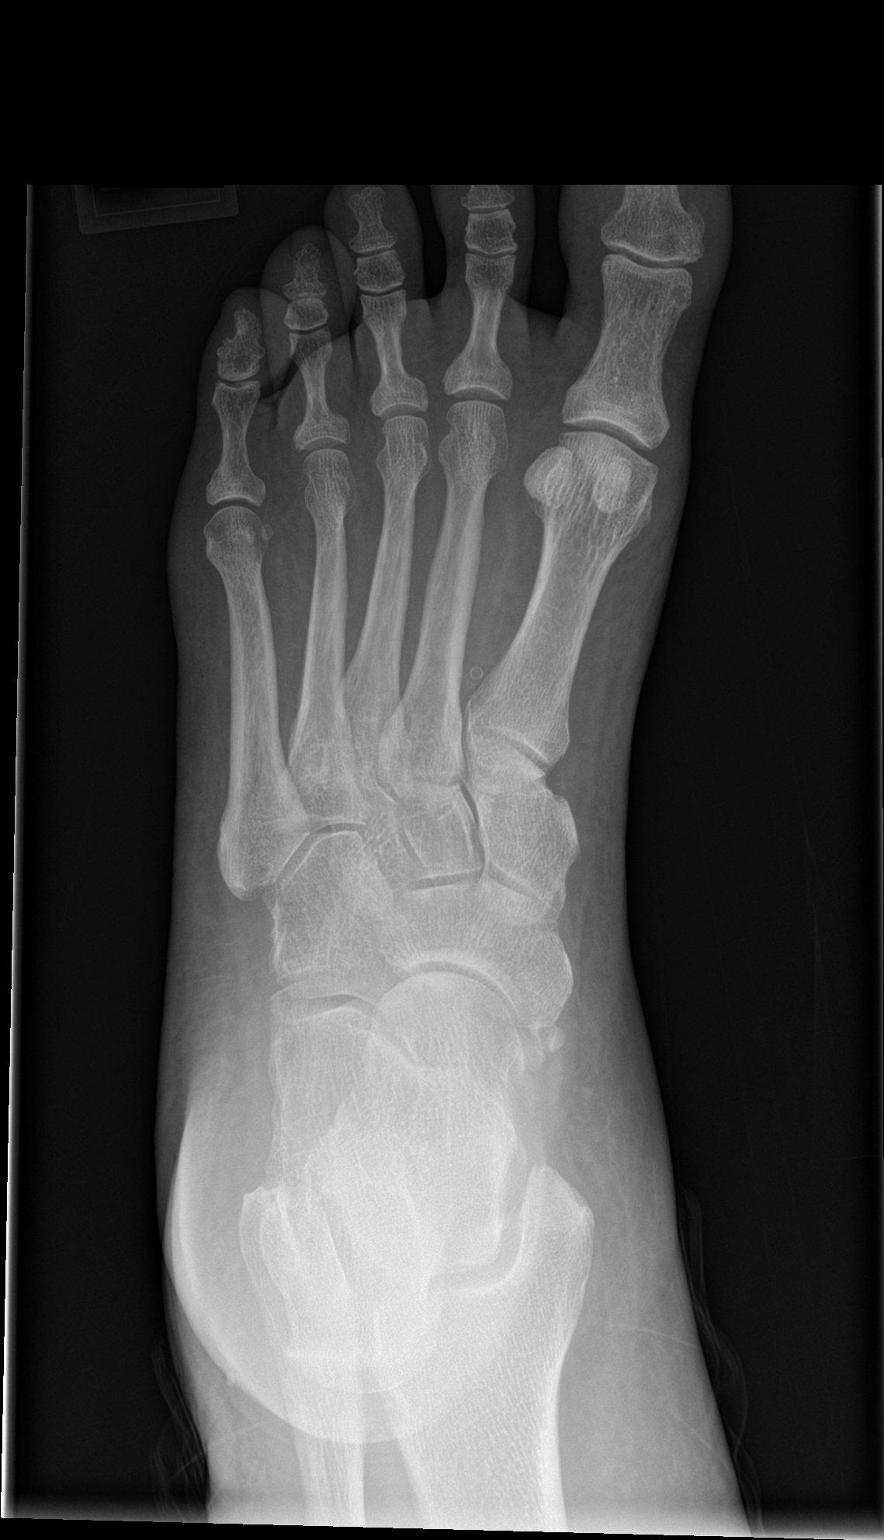
[im 2/3]
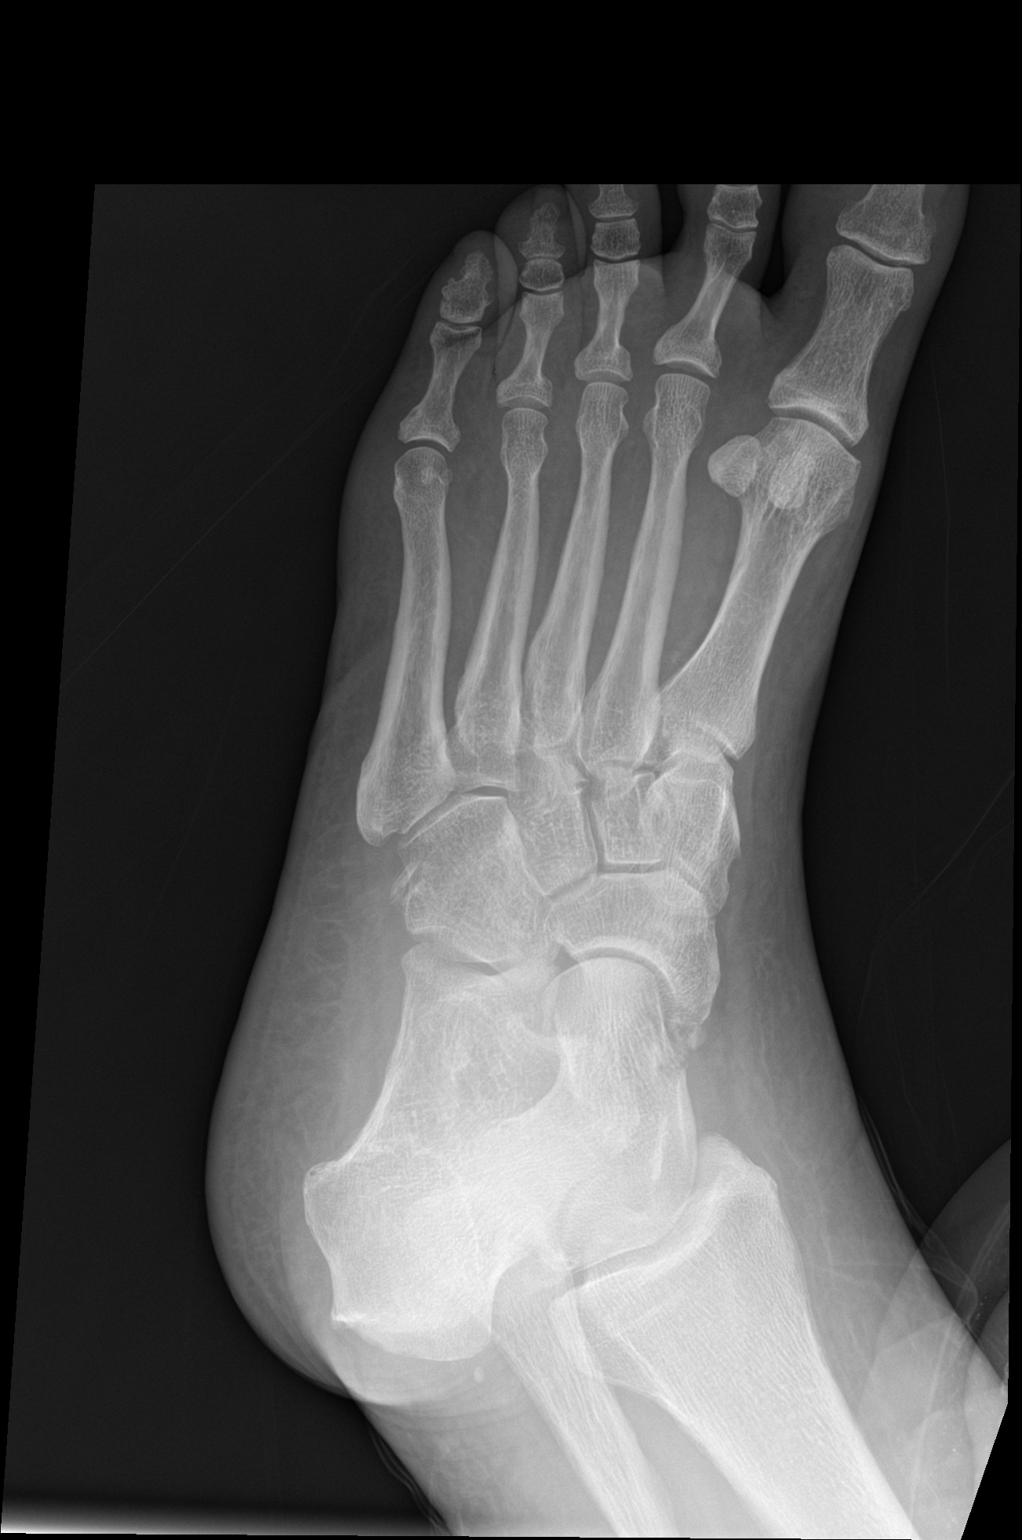
[im 3/3]
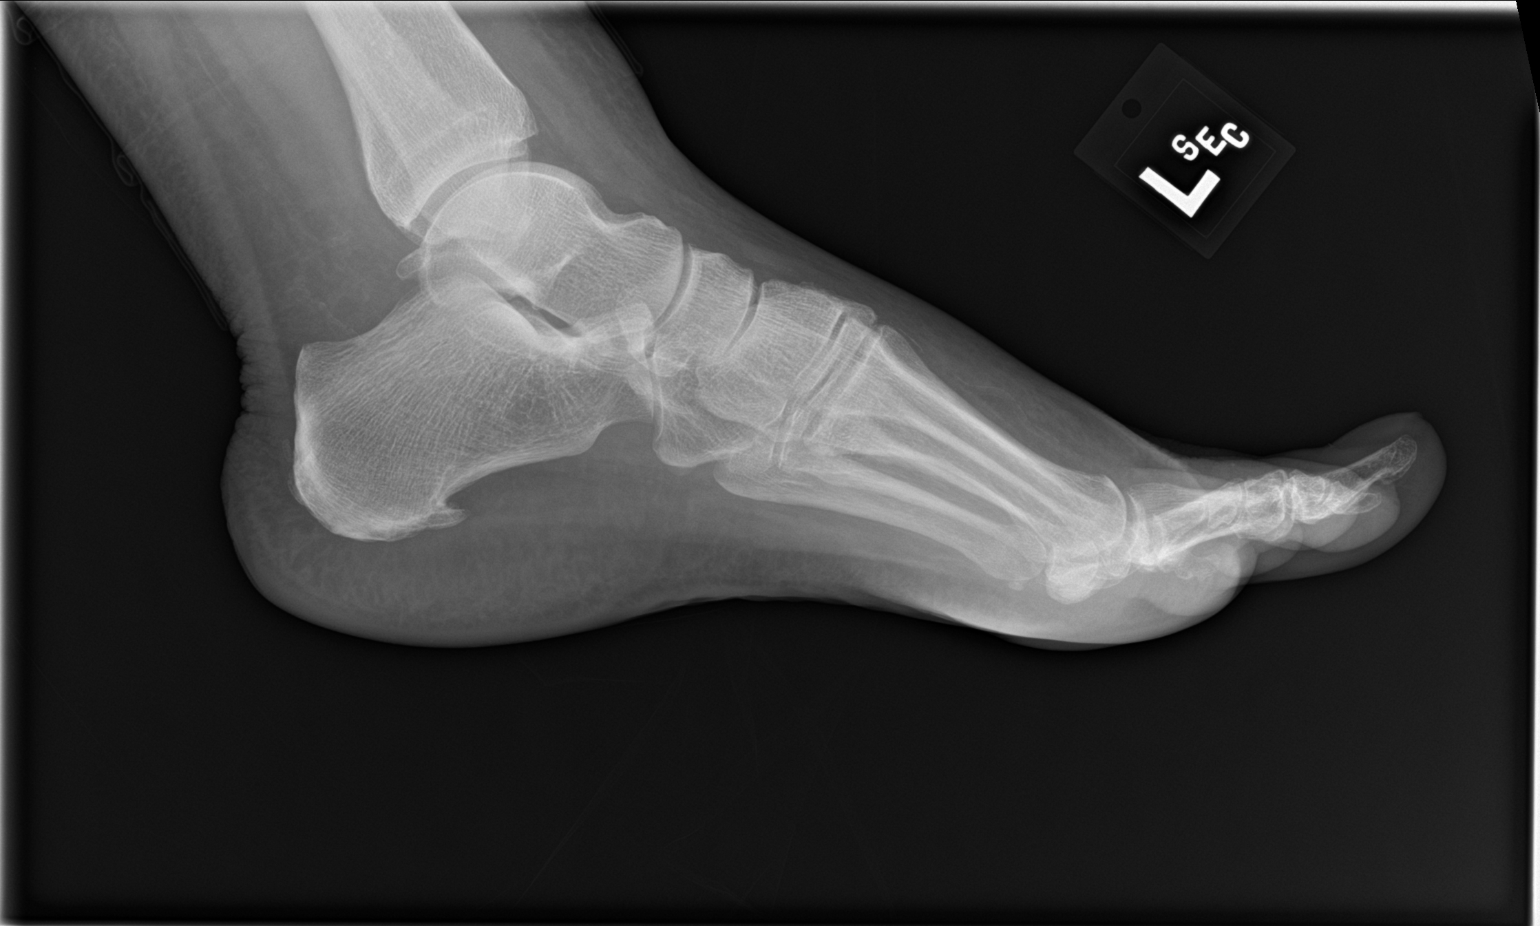

[3 of 3 positions shown; findings below may reference images not displayed]

FINDINGS: No acute displaced fracture. No focal soft tissue swelling. No
radiopaque foreign body. Mild degenerative changes of the
interphalangeal joints. Calcifications of the tibial and pedal
vasculature.
IMPRESSION: Negative for acute bony abnormality.

Tibial and pedal vascular calcifications.

## 2018-09-21 ENCOUNTER — Encounter: Payer: Self-pay | Admitting: Emergency Medicine

## 2018-09-21 ENCOUNTER — Other Ambulatory Visit: Payer: Self-pay

## 2018-09-21 ENCOUNTER — Emergency Department
Admission: EM | Admit: 2018-09-21 | Discharge: 2018-09-21 | Disposition: A | Discharge status: Home / Self Care | Payer: Managed Care, Other (non HMO) | Attending: Emergency Medicine | Admitting: Emergency Medicine

## 2018-09-21 DIAGNOSIS — Z87891 Personal history of nicotine dependence: Secondary | ICD-10-CM | POA: Diagnosis not present

## 2018-09-21 DIAGNOSIS — J449 Chronic obstructive pulmonary disease, unspecified: Secondary | ICD-10-CM | POA: Insufficient documentation

## 2018-09-21 DIAGNOSIS — E1165 Type 2 diabetes mellitus with hyperglycemia: Secondary | ICD-10-CM | POA: Diagnosis not present

## 2018-09-21 DIAGNOSIS — I2510 Atherosclerotic heart disease of native coronary artery without angina pectoris: Secondary | ICD-10-CM | POA: Diagnosis not present

## 2018-09-21 DIAGNOSIS — R739 Hyperglycemia, unspecified: Secondary | ICD-10-CM

## 2018-09-21 DIAGNOSIS — Z79899 Other long term (current) drug therapy: Secondary | ICD-10-CM | POA: Insufficient documentation

## 2018-09-21 DIAGNOSIS — N200 Calculus of kidney: Secondary | ICD-10-CM

## 2018-09-21 DIAGNOSIS — Z7984 Long term (current) use of oral hypoglycemic drugs: Secondary | ICD-10-CM | POA: Insufficient documentation

## 2018-09-21 DIAGNOSIS — I10 Essential (primary) hypertension: Secondary | ICD-10-CM | POA: Insufficient documentation

## 2018-09-21 DIAGNOSIS — J45909 Unspecified asthma, uncomplicated: Secondary | ICD-10-CM | POA: Insufficient documentation

## 2018-09-21 LAB — BLOOD GAS, VENOUS
Acid-Base Excess: 2.8 mmol/L — ABNORMAL HIGH (ref 0.0–2.0)
Bicarbonate: 28.5 mmol/L — ABNORMAL HIGH (ref 20.0–28.0)
O2 Saturation: 86 %
Patient temperature: 37
pCO2, Ven: 47 mmHg (ref 44.0–60.0)
pH, Ven: 7.39 (ref 7.250–7.430)
pO2, Ven: 52 mmHg — ABNORMAL HIGH (ref 32.0–45.0)

## 2018-09-21 LAB — BASIC METABOLIC PANEL
Anion gap: 10 (ref 5–15)
BUN: 13 mg/dL (ref 6–20)
CO2: 26 mmol/L (ref 22–32)
Calcium: 9.1 mg/dL (ref 8.9–10.3)
Chloride: 101 mmol/L (ref 98–111)
Creatinine, Ser: 0.79 mg/dL (ref 0.44–1.00)
GFR calc Af Amer: >60 mL/min (ref >60)
GFR calc non Af Amer: >60 mL/min (ref >60)
Glucose, Bld: 348 mg/dL — ABNORMAL HIGH (ref 70–99)
Potassium: 4.1 mmol/L (ref 3.5–5.1)
Sodium: 137 mmol/L (ref 135–145)

## 2018-09-21 LAB — URINALYSIS, COMPLETE (UACMP) WITH MICROSCOPIC
Bacteria, UA: NONE SEEN
Bilirubin Urine: NEGATIVE
Glucose, UA: >=500 mg/dL — AB
Hgb urine dipstick: NEGATIVE
Ketones, ur: NEGATIVE mg/dL
Leukocytes,Ua: NEGATIVE
Nitrite: NEGATIVE
Protein, ur: NEGATIVE mg/dL
Specific Gravity, Urine: 1.032 — ABNORMAL HIGH (ref 1.005–1.030)
pH: 6 (ref 5.0–8.0)

## 2018-09-21 LAB — CBC WITH DIFFERENTIAL/PLATELET
Abs Immature Granulocytes: 0.03 10*3/uL (ref 0.00–0.07)
Basophils Absolute: 0.1 10*3/uL (ref 0.0–0.1)
Basophils Relative: 1 %
Eosinophils Absolute: 0.2 10*3/uL (ref 0.0–0.5)
Eosinophils Relative: 3 %
HCT: 43.8 % (ref 36.0–46.0)
Hemoglobin: 14.9 g/dL (ref 12.0–15.0)
Immature Granulocytes: 1 %
Lymphocytes Relative: 28 %
Lymphs Abs: 1.8 10*3/uL (ref 0.7–4.0)
MCH: 26.8 pg (ref 26.0–34.0)
MCHC: 34 g/dL (ref 30.0–36.0)
MCV: 78.6 fL — ABNORMAL LOW (ref 80.0–100.0)
Monocytes Absolute: 0.4 10*3/uL (ref 0.1–1.0)
Monocytes Relative: 6 %
Neutro Abs: 4.1 10*3/uL (ref 1.7–7.7)
Neutrophils Relative %: 61 %
Platelets: 196 10*3/uL (ref 150–400)
RBC: 5.57 MIL/uL — ABNORMAL HIGH (ref 3.87–5.11)
RDW: 12.1 % (ref 11.5–15.5)
WBC: 6.5 10*3/uL (ref 4.0–10.5)
nRBC: 0 % (ref 0.0–0.2)

## 2018-09-21 LAB — GLUCOSE, CAPILLARY
Glucose-Capillary: 370 mg/dL — ABNORMAL HIGH (ref 70–99)
Glucose-Capillary: 268 mg/dL — ABNORMAL HIGH (ref 70–99)

## 2018-09-21 MED ORDER — ONETOUCH VERIO VI STRP: ORAL_STRIP | 3 refills | Status: AC

## 2018-09-21 MED ORDER — ONDANSETRON HCL 4 MG/2ML IJ SOLN
4.0000 mg | Freq: Once | INTRAMUSCULAR | Status: AC
  Administered 2018-09-21: 4 mg via INTRAVENOUS
  Filled 2018-09-21: qty 2

## 2018-09-21 MED ORDER — SODIUM CHLORIDE 0.9 % IV BOLUS
500.0000 mL | Freq: Once | INTRAVENOUS | Status: AC
  Administered 2018-09-21: 500 mL via INTRAVENOUS

## 2018-09-21 NOTE — ED Provider Notes (Signed)
[facility] Emergency Department Provider Note  ____________________________________________   I have reviewed the triage vital signs and the nursing notes. Where available I have reviewed prior notes and, if possible and indicated, outside hospital notes.    HISTORY  Chief Complaint Hyperglycemia    HPI Andrea Pollard is a 63 y.o. female  with a history of diabetes mellitus, who states that she ran out of her metformin and became very anxious today because her sugars were going up.  She now has her metformin she did take 2 metformin prior to coming in, she denies any other symptoms of any significance she just states "I feel that my sugar is high.  She states she has slight nausea which she always has when her sugars are up.  She has poorly controlled diabetes otherwise.  The only medications that she is taking for diabetes is metformin.  She does not have a headache she denies stiff neck she does not have chest pain she does not shortness of breath, she has not actually vomited she has no diarrhea, she felt fine until she ran out of metformin she now has metformin but she is very anxious about her sugar levels she states.    Past Medical History:  Diagnosis Date  . Anxiety   . Asthma   . Complication of anesthesia   . COPD (chronic obstructive pulmonary disease) (HCC)    diagnosed from pulmonary function tests  . Depression   . Diabetes mellitus without complication ([place])   . Dvt femoral (deep venous thrombosis) ([place]) 2018   in lung also.  treated with xarelto for 6 months  . GERD (gastroesophageal reflux disease)   . 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 History of hiatal hernia   . History of kidney stones 2019  . History of methicillin resistant staphylococcus aureus (MRSA) 2013   in neck due to ingrown hair. had surgery to drain cyst  . History of palpitations 2019   due to a panic attack  . Hypertension   . Lung nodule 2019   being followed by  dr. Grayland Ormond, dr Genevive Bi and dr. Mortimer Fries  . Pneumonia 04/2016  . PONV (postoperative nausea and vomiting)    NAUSEATED  . Pulmonary emboli ([place]) 2018  . Sleep apnea    CPAP  . Vertigo     Patient Active Problem List   Diagnosis Date Noted  . Pulmonary embolism ([place]) 03/04/2017  . Benign hypertension 10/05/2016  . Constipation 10/05/2016  . Gastroesophageal reflux disease 10/05/2016  . Hypercholesterolemia 10/05/2016  . Indigestion 10/05/2016  . Obstructive sleep apnea of adult 10/05/2016  . Type 2 diabetes mellitus ([place]) 10/05/2016  . Vitamin D deficiency 10/05/2016  . Coronary artery disease of native artery of native heart with stable angina pectoris ([place]) 09/27/2016  . Aortic atherosclerosis ([place]) 09/27/2016  . History of smoking 09/27/2016  . Lung nodule 06/07/2016  . Morbid obesity ([place]) 04/23/2014    Past Surgical History:  Procedure Laterality Date  . ABDOMINAL HYSTERECTOMY  1994  . CESAREAN SECTION    . CHOLECYSTECTOMY  2013  . CYST REMOVAL NECK  2013   was ingrown hair . positive for mrsa at that time prior to removal in OR  . CYSTOSCOPY WITH URETEROSCOPY AND STENT PLACEMENT Right 05/31/2018   Procedure: CYSTOSCOPY WITH URETEROSCOPY, LASER LITHOTRIPSY AND STENT PLACEMENT;  Surgeon: Billey Co, MD;  Location: [facility] ORS;  Service: Urology;  Laterality: Right;  . ELECTROMAGNETIC NAVIGATION BROCHOSCOPY N/A 08/15/2016  Procedure: ELECTROMAGNETIC NAVIGATION BRONCHOSCOPY;  Surgeon: Flora Lipps, MD;  Location: [facility] ORS;  Service: Cardiopulmonary;  Laterality: N/A;  . ELECTROMAGNETIC NAVIGATION BROCHOSCOPY Right 11/07/2017   Procedure: ELECTROMAGNETIC NAVIGATION BRONCHOSCOPY;  Surgeon: Flora Lipps, MD;  Location: [facility] ORS;  Service: Cardiopulmonary;  Laterality: Right;  . LAPAROSCOPIC GASTRIC SLEEVE RESECTION  2015   lost 75 pounds    Prior to Admission medications   Medication Sig Start Date End Date Taking? Authorizing Provider  albuterol (PROAIR HFA) 108 (90 Base)  MCG/ACT inhaler Inhale 2 Inhalers into the lungs as needed for wheezing or shortness of breath.     [provider]  atorvastatin (LIPITOR) 80 MG tablet Take 80 mg by mouth at bedtime.  08/30/16   [provider]  busPIRone (BUSPAR) 5 MG tablet Take 5 mg by mouth 2 (two) times daily.     [provider]  FLOVENT HFA 220 MCG/ACT inhaler Inhale 2 puffs into the lungs daily.  12/11/16   [provider]  hydrOXYzine (VISTARIL) 25 MG capsule Take 25-50 mg by mouth 3 (three) times daily as needed for anxiety.    [provider]  lansoprazole (PREVACID) 15 MG capsule Take 15 mg by mouth at bedtime.     [provider]  lisinopril (PRINIVIL,ZESTRIL) 5 MG tablet Take 5 mg by mouth daily. 12/26/17   [provider]  metFORMIN (GLUCOPHAGE-XR) 500 MG 24 hr tablet Take 1,000 mg by mouth 2 (two) times daily.  03/23/16   [provider]  Glory Rosebush VERIO test strip TEST ONCE D 02/27/18   [provider]  sertraline (ZOLOFT) 100 MG tablet Take 100 mg by mouth daily. 12/03/17   [provider]    Allergies Ibuprofen; Levaquin [levofloxacin in d5w]; Sulfamethoxazole-trimethoprim; and Nitroglycerin  Family History  Problem Relation Age of Onset  . Stroke Father   . Ovarian cancer Maternal Grandmother   . Breast cancer Neg Hx     Social History Social History   Tobacco Use  . Smoking status: Former Smoker    Packs/day: 1.00    Years: 5.00    Pack years: 5.00    Types: Cigarettes    Last attempt to quit: 08/12/1991    Years since quitting: 27.1  . Smokeless tobacco: Never Used  Substance Use Topics  . Alcohol use: No  . Drug use: No    Review of Systems Constitutional: No fever/chills Eyes: No visual changes. ENT: No sore throat. No stiff neck no neck pain Cardiovascular: Denies chest pain. Respiratory: Denies shortness of breath. Gastrointestinal:   no vomiting.  No diarrhea.  No constipation. Genitourinary:  Negative for dysuria. Musculoskeletal: Negative lower extremity swelling Skin: Negative for rash. Neurological: Negative for severe headaches, focal weakness or numbness.   ____________________________________________   PHYSICAL EXAM:  VITAL SIGNS: ED Triage Vitals  Enc Vitals Group     BP 09/21/18 1225 134/86     Pulse Rate 09/21/18 1225 85     Resp 09/21/18 1225 16     Temp 09/21/18 1225 98.2 F (36.8 C)     Temp Source 09/21/18 1225 Oral     SpO2 09/21/18 1225 97 %     Weight 09/21/18 1226 230 lb (104.3 kg)     Height 09/21/18 1226 5\' 5"  (1.651 m)     Head Circumference --      Peak Flow --      Pain Score 09/21/18 1226 7     Pain Loc --      Pain  Edu? --      Excl. in [place]? --     Constitutional: Alert and oriented. Well appearing and in no acute distress. Eyes: Conjunctivae are normal Head: Atraumatic HEENT: No congestion/rhinnorhea. Mucous membranes are moist.  Oropharynx non-erythematous Neck:   Nontender with no meningismus, no masses, no stridor Cardiovascular: Normal rate, regular rhythm. Grossly normal heart sounds.  Good peripheral circulation. Respiratory: Normal respiratory effort.  No retractions. Lungs CTAB. Abdominal: Soft and nontender. No distention. No guarding no rebound Back:  There is no focal tenderness or step off.  there is no midline tenderness there are no lesions noted. there is no CVA tenderness  Musculoskeletal: No lower extremity tenderness, no upper extremity tenderness. No joint effusions, no DVT signs strong distal pulses no edema Neurologic:  Normal speech and language. No gross focal neurologic deficits are appreciated.  Skin:  Skin is warm, dry and intact. No rash noted. Psychiatric: Mood and affect are anxious. Speech and behavior are normal.  ____________________________________________   LABS (all labs ordered are listed, but only abnormal results are displayed)  Labs Reviewed  GLUCOSE, CAPILLARY - Abnormal; Notable for the  following components:      Result Value   Glucose-Capillary 370 (*)    All other components within normal limits  BASIC METABOLIC PANEL  CBC WITH DIFFERENTIAL/PLATELET  BLOOD GAS, VENOUS  URINALYSIS, COMPLETE (UACMP) WITH MICROSCOPIC    Pertinent labs  results that were available during my care of the patient were reviewed by me and considered in my medical decision making (see chart for details). ____________________________________________  EKG  I personally interpreted any EKGs ordered by me or triage  ____________________________________________  RADIOLOGY  Pertinent labs & imaging results that were available during my care of the patient were reviewed by me and considered in my medical decision making (see chart for details). If possible, patient and/or family made aware of any abnormal findings.  No results found. ____________________________________________    PROCEDURES  Procedure(s) performed: None  Procedures  Critical Care performed: None  ____________________________________________   INITIAL IMPRESSION / ASSESSMENT AND PLAN / ED COURSE  Pertinent labs & imaging results that were available during my care of the patient were reviewed by me and considered in my medical decision making (see chart for details).  Patient here stating that she is anxious about her sugar levels, she looks quite well, is not surprising that without her diabetes medications her blood sugars to be elevated.  Review of prior blood sugars at this facility shows that they do run high actually she is been much higher, we will do a basic blood work to make sure that she does not have an anion gap, or evidence of DKA we will give her IV fluids which I think will help her as her last EF was good, and I will see if that helps her sugar come down with IV hydration.  She is not in DKA, and she already has her medications filled, there is really no strong indication for admission or aggressive  intervention at this time    ____________________________________________   FINAL CLINICAL IMPRESSION(S) / ED DIAGNOSES  Final diagnoses:  None      This chart was dictated using voice recognition software.  Despite best efforts to proofread,  errors can occur which can change meaning.      Schuyler Amor, MD 09/21/18 1309

## 2018-09-21 NOTE — Discharge Instructions (Signed)
Your medication as prescribed return to the emergency room for any new or worrisome symptoms including but not limited to numbness, weakness, headache, vomiting, chest pain, shortness of breath, pain when you urinate, fever or other concerns.

## 2018-09-21 NOTE — ED Triage Notes (Signed)
Pt to ED via POV c/o high blood sugar. Pt states that she ran out of strips so she isn't sure how high her sugar has been. Pt states that she having nausea and feels like her head it heavy. Pt is on Metformin. Pt states that she has still be taking medications.

## 2019-01-14 IMAGING — CR DG ABDOMEN 1V
1 series · 2 of 2 positions shown · non-contrast
Comparison: CT abdomen and pelvis May 15, 2018

CLINICAL DATA: Right flank pain with recurrent urinary tract
infections

EXAM:
ABDOMEN - 1 VIEW

[Series 1: dg abd 1 view · 0.14mm/px · 2 of 2 slices shown]
[im 1/2]
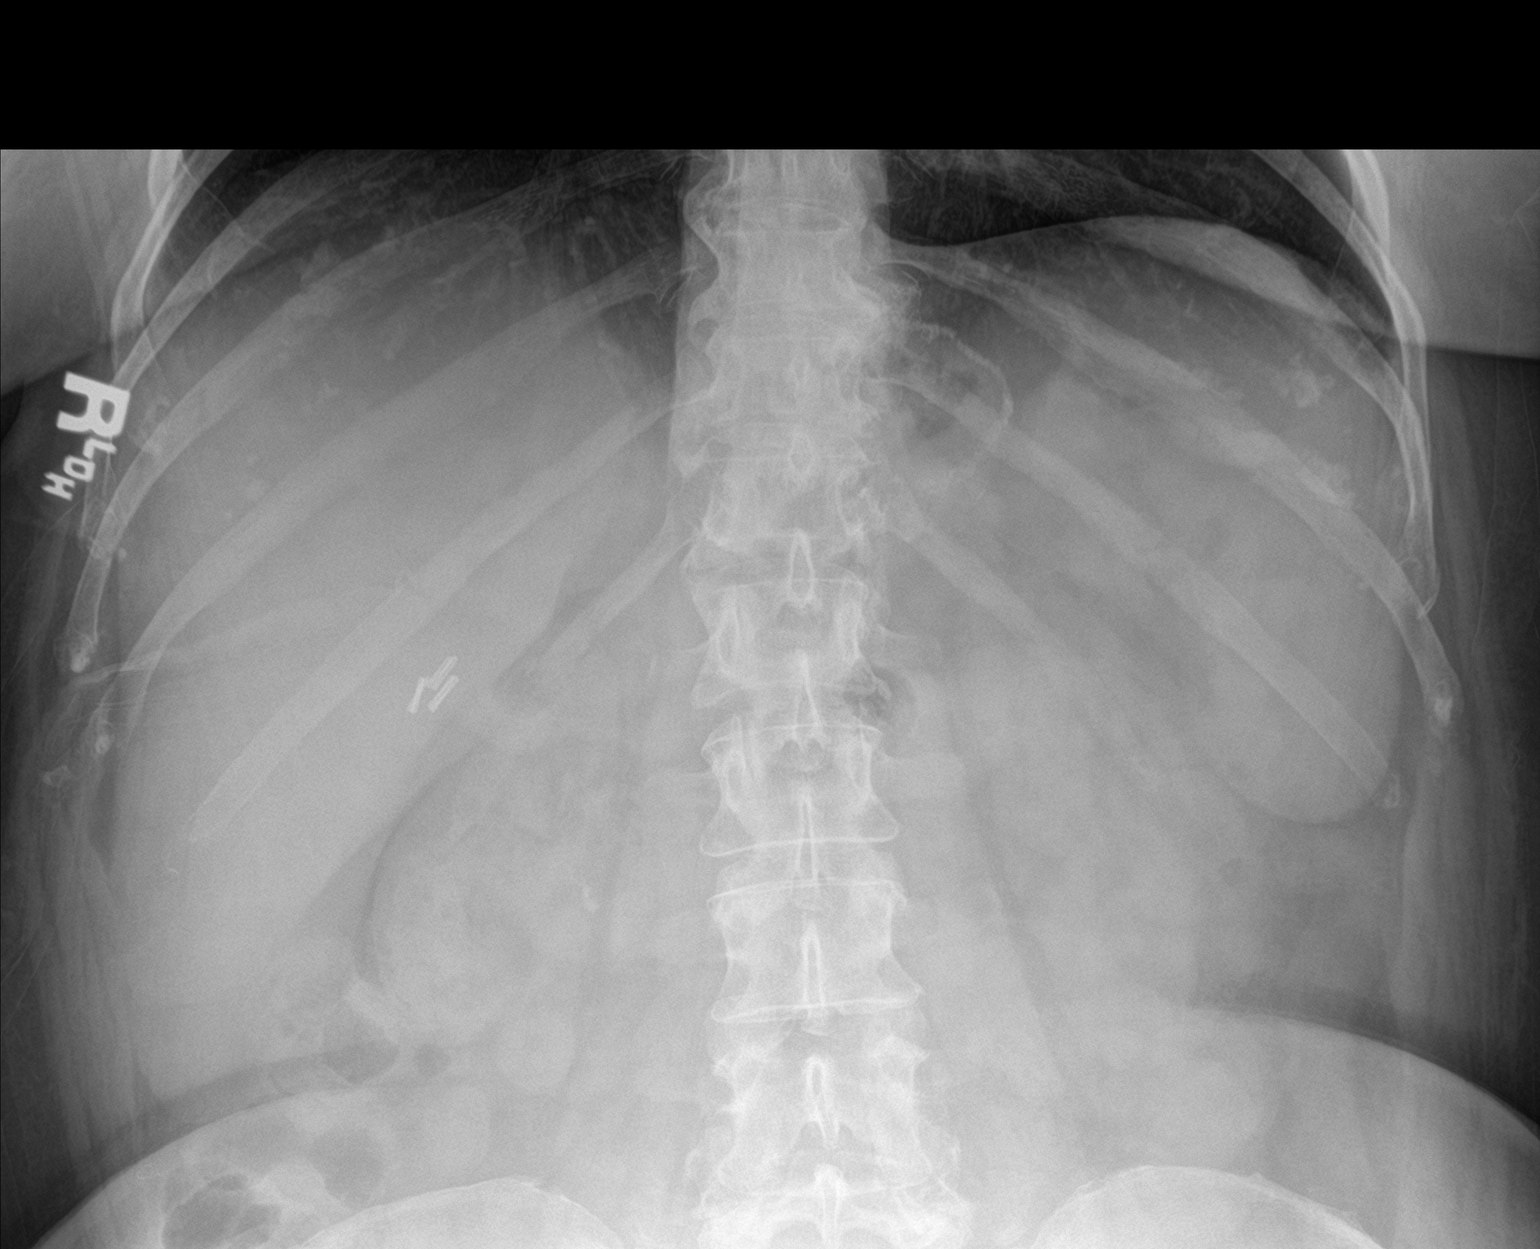
[im 2/2]
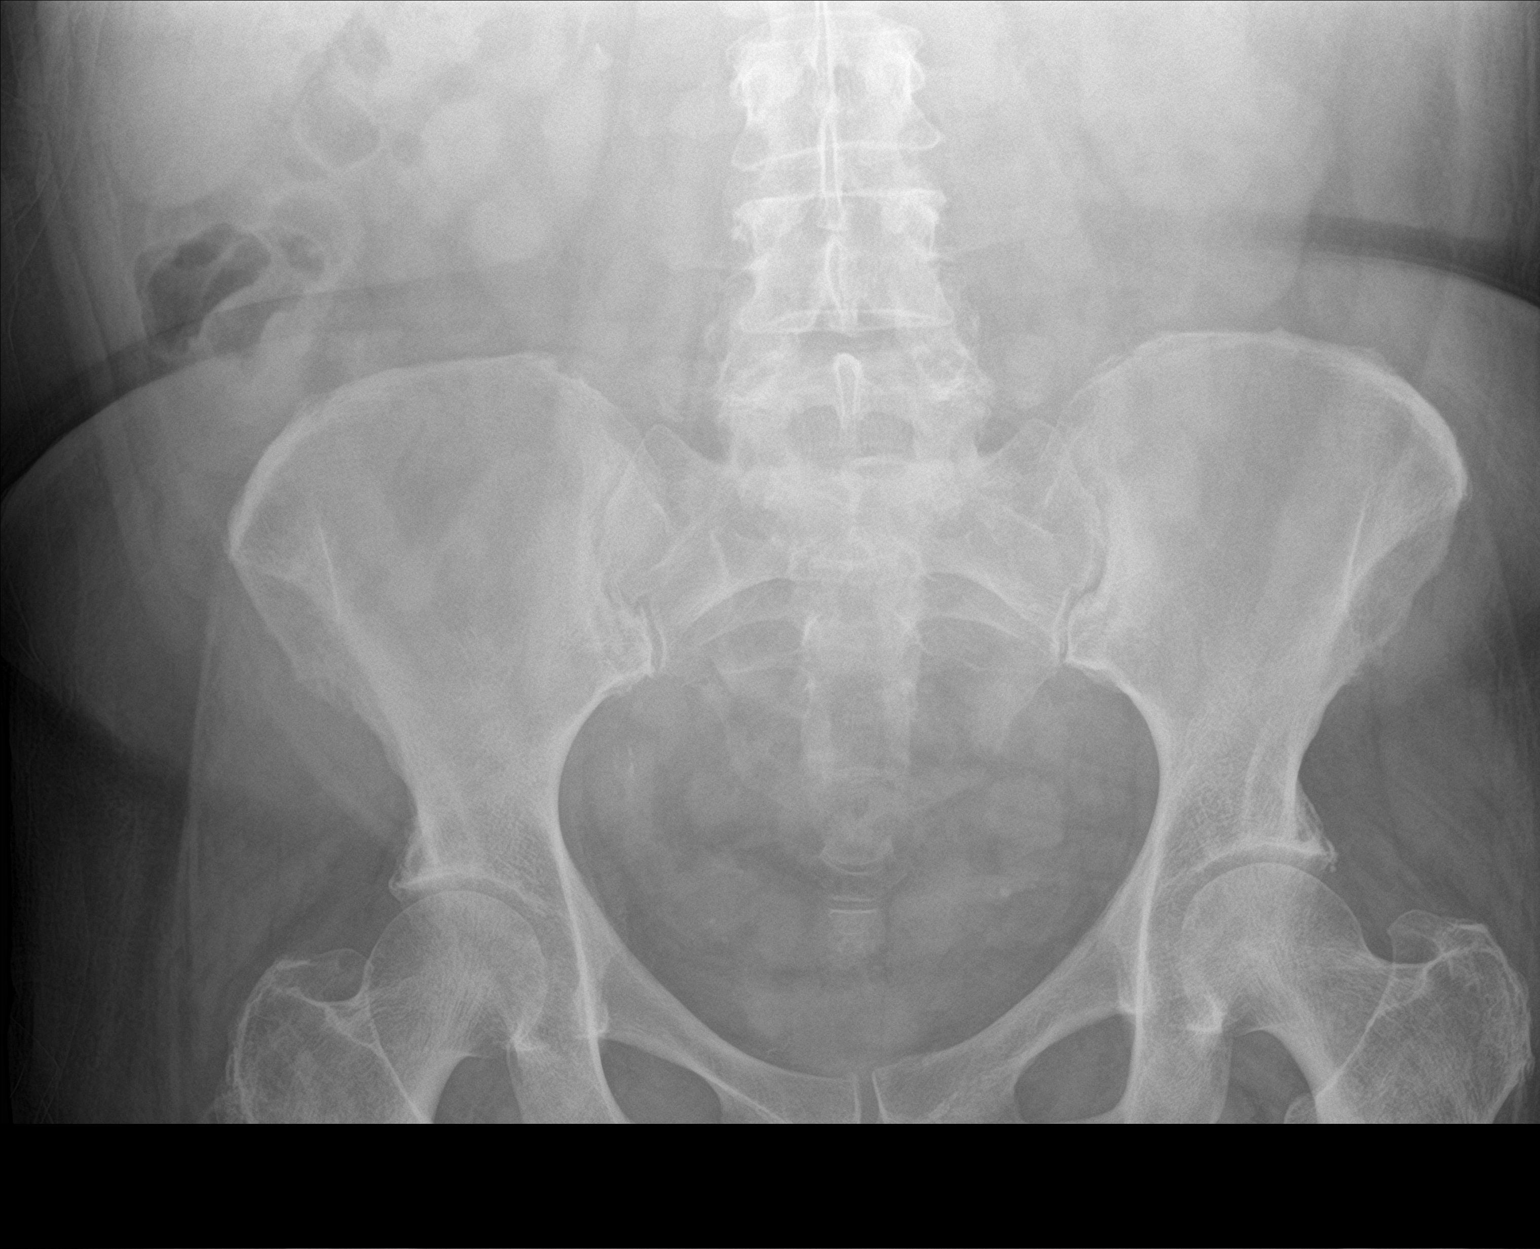

[2 of 2 positions shown; findings below may reference images not displayed]

FINDINGS: There is a focal calcification near or at the right ureteropelvic
junction measuring 5 x 3 mm. There is a probable 3 mm phlebolith in
the left pelvis. Tiny 1 mm probable phleboliths are noted
bilaterally in the pelvis as well.

There is moderate stool in the colon. No bowel dilatation or
air-fluid level to suggest bowel obstruction. No free air. Lung
bases are clear. There are surgical clips in the left upper quadrant
medially as well as in the gallbladder fossa region.
IMPRESSION: 1. 5 x 3 mm calculus near or at the right ureteropelvic junction.
Probable small phleboliths in the pelvis.

2. Moderate stool in colon. No evident bowel obstruction or free
air. Postoperative changes in upper abdomen.

## 2019-01-24 NOTE — Progress Notes (Signed)
[facility]  [phone] [phone]  ID: [name] OB: 1956/01/26  MR#: [id number]  [id number]  Patient Care Team: Ellamae Sia, MD as PCP - General (Internal Medicine) Minna Merritts, MD as Consulting Physician (Cardiology) Telford Nab, RN as Registered Nurse  CHIEF COMPLAINT: Mass of upper lobe of right lung.  INTERVAL HISTORY: Patient returns to clinic today for routine 54-month evaluation and discussion of her imaging results.  She continues to be highly anxious, but otherwise feels well. She has no neurologic complaints. She denies any recent fevers or illnesses. She has a good appetite and denies weight loss. She she denies any shortness of breath, cough, chest pain, or hemoptysis. She denies any nausea, vomiting, constipation, or diarrhea. She has no urinary complaints.  Patient feels at her baseline offers no specific complaints today.  REVIEW OF SYSTEMS:   Review of Systems  Constitutional: Negative.  Negative for fever, malaise/fatigue and weight loss.  Respiratory: Negative.  Negative for cough, hemoptysis and shortness of breath.   Cardiovascular: Negative.  Negative for chest pain and leg swelling.  Gastrointestinal: Negative.  Negative for abdominal pain.  Genitourinary: Negative.  Negative for dysuria.  Musculoskeletal: Negative.  Negative for back pain.  Skin: Negative.  Negative for rash.  Neurological: Negative.  Negative for sensory change, focal weakness and weakness.  Psychiatric/Behavioral: The patient is nervous/anxious. The patient does not have insomnia.     As per HPI. Otherwise, a complete review of systems is negative.  PAST MEDICAL HISTORY: Past Medical History:  Diagnosis Date   Anxiety    Asthma    Complication of anesthesia    COPD (chronic obstructive pulmonary disease) ([place])    diagnosed from pulmonary function tests   Depression    Diabetes mellitus without complication ([place])      Dvt femoral (deep venous thrombosis) ([place]) 2018   in lung also.  treated with xarelto for 6 months   GERD (gastroesophageal reflux disease)    Hepatitis    AGE 5-HEPATITIS B   History of hiatal hernia    History of kidney stones 2019   History of methicillin resistant staphylococcus aureus (MRSA) 2013   in neck due to ingrown hair. had surgery to drain cyst   History of palpitations 2019   due to a panic attack   Hypertension    Lung nodule 2019   being followed by dr. Grayland Ormond, dr Genevive Bi and dr. Mortimer Fries   Pneumonia 04/2016   PONV (postoperative nausea and vomiting)    NAUSEATED   Pulmonary emboli ([place]) 2018   Sleep apnea    CPAP   Vertigo     PAST SURGICAL HISTORY: Past Surgical History:  Procedure Laterality Date   ABDOMINAL HYSTERECTOMY  1994   CESAREAN SECTION     CHOLECYSTECTOMY  2013   CYST REMOVAL NECK  2013   was ingrown hair . positive for mrsa at that time prior to removal in [place] URETEROSCOPY AND STENT PLACEMENT Right 05/31/2018   Procedure: CYSTOSCOPY WITH URETEROSCOPY, LASER LITHOTRIPSY AND STENT PLACEMENT;  Surgeon: Billey Co, MD;  Location: [facility] ORS;  Service: Urology;  Laterality: Right;   ELECTROMAGNETIC NAVIGATION BROCHOSCOPY N/A 08/15/2016   Procedure: ELECTROMAGNETIC NAVIGATION BRONCHOSCOPY;  Surgeon: Flora Lipps, MD;  Location: [facility] ORS;  Service: Cardiopulmonary;  Laterality: N/A;   ELECTROMAGNETIC NAVIGATION BROCHOSCOPY Right 11/07/2017   Procedure: ELECTROMAGNETIC NAVIGATION BRONCHOSCOPY;  Surgeon: Flora Lipps, MD;  Location: [facility] ORS;  Service: Cardiopulmonary;  Laterality: Right;  LAPAROSCOPIC GASTRIC SLEEVE RESECTION  2015   lost 75 pounds    FAMILY HISTORY: Family History  Problem Relation Age of Onset   Stroke Father    Ovarian cancer Maternal Grandmother    Breast cancer Neg Hx     ADVANCED DIRECTIVES (Y/N):  N  HEALTH MAINTENANCE: Social History   Tobacco Use   Smoking status: Former  Smoker    Packs/day: 1.00    Years: 5.00    Pack years: 5.00    Types: Cigarettes    Quit date: 08/12/1991    Years since quitting: 27.5   Smokeless tobacco: Never Used  Substance Use Topics   Alcohol use: No   Drug use: No     Colonoscopy:  PAP:  Bone density:  Lipid panel:  Allergies  Allergen Reactions   Ibuprofen Other (See Comments)    Cannot Use due to Gastric SLEEVE SURGERY   Levaquin [Levofloxacin In D5w] Hives   Sulfamethoxazole-Trimethoprim Hives   Nitroglycerin Other (See Comments)    Severe Hypotension    Current Outpatient Medications  Medication Sig Dispense Refill   albuterol (PROAIR HFA) 108 (90 Base) MCG/ACT inhaler Inhale 2 Inhalers into the lungs as needed for wheezing or shortness of breath.      atorvastatin (LIPITOR) 80 MG tablet Take 80 mg by mouth at bedtime.   2   busPIRone (BUSPAR) 5 MG tablet Take 5 mg by mouth 2 (two) times daily.      FLOVENT HFA 220 MCG/ACT inhaler Inhale 2 puffs into the lungs daily.   5   hydrOXYzine (VISTARIL) 25 MG capsule Take 50 mg by mouth 2 (two) times daily.      lansoprazole (PREVACID) 15 MG capsule Take 15 mg by mouth at bedtime.      lisinopril (PRINIVIL,ZESTRIL) 5 MG tablet Take 5 mg by mouth daily.  3   metFORMIN (GLUCOPHAGE-XR) 500 MG 24 hr tablet Take 1,000 mg by mouth 2 (two) times daily.   2   ONETOUCH VERIO test strip TEST ONCE D 100 each 3   sertraline (ZOLOFT) 100 MG tablet Take 100 mg by mouth daily.  3   No current facility-administered medications for this visit.     OBJECTIVE: Vitals:   02/03/19 1401  BP: (!) 147/87  Pulse: 84  Temp: 97.7 F (36.5 C)     Body mass index is 37.63 kg/m.    ECOG FS:0 - Asymptomatic  General: Well-developed, well-nourished, no acute distress. Eyes: Pink conjunctiva, anicteric sclera. HEENT: Normocephalic, moist mucous membranes. Lungs: Clear to auscultation bilaterally. Heart: Regular rate and rhythm. No rubs, murmurs, or gallops. Abdomen:  Soft, nontender, nondistended. No organomegaly noted, normoactive bowel sounds. Musculoskeletal: No edema, cyanosis, or clubbing. Neuro: Alert, answering all questions appropriately. Cranial nerves grossly intact. Skin: No rashes or petechiae noted. Psych: Normal affect.  LAB RESULTS:  Lab Results  Component Value Date   NA 137 09/21/2018   K 4.1 09/21/2018   CL 101 09/21/2018   CO2 26 09/21/2018   GLUCOSE 348 (H) 09/21/2018   BUN 13 09/21/2018   CREATININE 0.60 02/03/2019   CALCIUM 9.1 09/21/2018   PROT 7.0 05/15/2018   ALBUMIN 3.9 05/15/2018   AST 14 (L) 05/15/2018   ALT 18 05/15/2018   ALKPHOS 89 05/15/2018   BILITOT 0.9 05/15/2018   GFRNONAA >60 09/21/2018   GFRAA >60 09/21/2018    Lab Results  Component Value Date   WBC 6.5 09/21/2018   NEUTROABS 4.1 09/21/2018   HGB 14.9 09/21/2018  HCT 43.8 09/21/2018   MCV 78.6 (L) 09/21/2018   PLT 196 09/21/2018     STUDIES: Ct Chest W Contrast  Result Date: 02/03/2019 CLINICAL DATA:  63 year old female with history of pulmonary nodule. Followup study. EXAM: CT CHEST WITH CONTRAST TECHNIQUE: Multidetector CT imaging of the chest was performed during intravenous contrast administration. CONTRAST:  42mL OMNIPAQUE IOHEXOL 300 MG/ML  SOLN COMPARISON:  Chest CT 07/22/2018. FINDINGS: Cardiovascular: Heart size is normal. There is no significant pericardial fluid, thickening or pericardial calcification. There is aortic atherosclerosis, as well as atherosclerosis of the great vessels of the mediastinum and the coronary arteries, including calcified atherosclerotic plaque in the left main, left anterior descending and right coronary arteries. Mediastinum/Nodes: No pathologically enlarged mediastinal or hilar lymph nodes. Please note that accurate exclusion of hilar adenopathy is limited on noncontrast CT scans. Esophagus is unremarkable in appearance. No axillary lymphadenopathy. Lungs/Pleura: The largest nodule seen on the prior  examination in the inferior aspect of the right upper lobe (axial image 56 of series 3) is stable measuring 1.5 x 1.2 cm. Multiple other smaller pulmonary nodules are noted throughout the lungs bilaterally similar in number to the prior examination. While some of these nodules have slightly decreased in size (example in the superior segment of the left lower lobe on axial image 53 of series 3 where there is a 5 x 3 mm nodule (previously 5 by 6 mm on 07/22/2018), other nodules have clearly increased slightly in size (example is a nodule in the lateral segment of the right middle lobe (axial image 84 of series 3) which currently measures 5 x 5 mm (previously 3 x 3 mm on 07/22/2018). No acute consolidative airspace disease. No pleural effusions. Upper Abdomen: Postoperative changes of sleeve gastrectomy. Status post cholecystectomy. Diffuse low attenuation throughout the visualized hepatic parenchyma, indicative of hepatic steatosis. Musculoskeletal: There are no aggressive appearing lytic or blastic lesions noted in the visualized portions of the skeleton. IMPRESSION: 1. Previously noted nodules in the lungs bilaterally are generally similar in number and distribution to the prior examination, as above. The largest nodule in the right upper lobe is stable in size, and some other nodules have slightly decreased in size while other nodules have slightly increased in size. Continued attention on follow-up studies is recommended. 2. No lymphadenopathy. 3. Aortic atherosclerosis, in addition to left main and 2 vessel coronary artery disease. Please note that although the presence of coronary artery calcium documents the presence of coronary artery disease, the severity of this disease and any potential stenosis cannot be assessed on this non-gated CT examination. Assessment for potential risk factor modification, dietary therapy or pharmacologic therapy may be warranted, if clinically indicated. 4. Hepatic steatosis.  Aortic Atherosclerosis (ICD10-I70.0). Electronically Signed   By: Vinnie Langton M.D.   On: 02/03/2019 09:52    ASSESSMENT: Mass of upper lobe of right lung  PLAN:    1. Mass of upper lobe of right lung: Patient underwent biopsy on November 07, 2017 that was negative for malignancy.  Despite the negative biopsy, there was still concern for malignancy.  CT scan results from February 03, 2019 reviewed independently and reported as above with persistent right upper lobe lung nodule that is unchanged for at least 1 year.  She also has scattered bilateral pulmonary nodules measuring up to 7 mm that are mildly progressive.  No intervention is needed at this time, patient acknowledges she may have to have repeat biopsy in the future.  Continue to monitor every 6  months.  Because of patient's increased anxiety, she continues to request imaging and clinic visits be on the same day. 2. Bilateral PE/left leg DVT: Resolved.  Patient is now completed anticoagulation.  Diagnosed by CT scan on November 28, 2016. Patient has no obvious transient risk factors. Hypercoagulable workup was negative except for an elevated DRVVT which can be attributed to the anticoagulation she is taking at that time.  I spent a total of 20 minutes face-to-face with the patient of which greater than 50% of the visit was spent in counseling and coordination of care as detailed above.   Patient expressed understanding and was in agreement with this plan. She also understands that She can call clinic at any time with any questions, concerns, or complaints.   Cancer Staging No matching staging information was found for the patient.  Lloyd Huger, MD   02/04/2019 6:49 AM

## 2019-01-29 ENCOUNTER — Other Ambulatory Visit: Payer: Self-pay | Admitting: Oncology

## 2019-01-29 DIAGNOSIS — R911 Solitary pulmonary nodule: Secondary | ICD-10-CM

## 2019-01-31 ENCOUNTER — Encounter: Payer: Self-pay | Admitting: Oncology

## 2019-01-31 ENCOUNTER — Other Ambulatory Visit: Payer: Self-pay

## 2019-01-31 NOTE — Progress Notes (Signed)
Patient prescreened no concerns today. 

## 2019-02-03 ENCOUNTER — Ambulatory Visit
Admission: RE | Admit: 2019-02-03 | Discharge: 2019-02-03 | Disposition: A | Discharge status: Home / Self Care | Payer: Managed Care, Other (non HMO) | Source: Ambulatory Visit | Attending: Oncology | Admitting: Oncology

## 2019-02-03 ENCOUNTER — Other Ambulatory Visit: Payer: Self-pay

## 2019-02-03 ENCOUNTER — Inpatient Hospital Stay: Payer: Managed Care, Other (non HMO) | Attending: Oncology | Admitting: Oncology

## 2019-02-03 VITALS — BP 147/87 | HR 84 | Temp 97.7°F | Wt 226.1 lb

## 2019-02-03 DIAGNOSIS — R911 Solitary pulmonary nodule: Secondary | ICD-10-CM

## 2019-02-03 DIAGNOSIS — I2699 Other pulmonary embolism without acute cor pulmonale: Secondary | ICD-10-CM | POA: Diagnosis not present

## 2019-02-03 LAB — POCT I-STAT CREATININE: Creatinine, Ser: 0.6 mg/dL (ref 0.44–1.00)

## 2019-02-03 MED ORDER — IOHEXOL 300 MG/ML  SOLN
75.0000 mL | Freq: Once | INTRAMUSCULAR | Status: AC | PRN
  Administered 2019-02-03: 75 mL via INTRAVENOUS

## 2019-05-30 ENCOUNTER — Other Ambulatory Visit: Payer: 59

## 2019-07-31 NOTE — Progress Notes (Deleted)
[facility]  [phone] [phone]  ID: [name] OB: [birth year]  MR#: [id number]  [id number]  Patient Care Team: Ellamae Sia, MD as PCP - General (Internal Medicine) Minna Merritts, MD as Consulting Physician (Cardiology) Telford Nab, RN as Registered Nurse  CHIEF COMPLAINT: Mass of upper lobe of right lung.  INTERVAL HISTORY: Patient returns to clinic today for routine 80-month evaluation and discussion of her imaging results.  She continues to be highly anxious, but otherwise feels well. She has no neurologic complaints. She denies any recent fevers or illnesses. She has a good appetite and denies weight loss. She she denies any shortness of breath, cough, chest pain, or hemoptysis. She denies any nausea, vomiting, constipation, or diarrhea. She has no urinary complaints.  Patient feels at her baseline offers no specific complaints today.  REVIEW OF SYSTEMS:   Review of Systems  Constitutional: Negative.  Negative for fever, malaise/fatigue and weight loss.  Respiratory: Negative.  Negative for cough, hemoptysis and shortness of breath.   Cardiovascular: Negative.  Negative for chest pain and leg swelling.  Gastrointestinal: Negative.  Negative for abdominal pain.  Genitourinary: Negative.  Negative for dysuria.  Musculoskeletal: Negative.  Negative for back pain.  Skin: Negative.  Negative for rash.  Neurological: Negative.  Negative for sensory change, focal weakness and weakness.  Psychiatric/Behavioral: The patient is nervous/anxious. The patient does not have insomnia.     As per HPI. Otherwise, a complete review of systems is negative.  PAST MEDICAL HISTORY: Past Medical History:  Diagnosis Date  . Anxiety   . Asthma   . Complication of anesthesia   . COPD (chronic obstructive pulmonary disease) (HCC)    diagnosed from pulmonary function tests  . Depression   . Diabetes mellitus without complication ([place])     . Dvt femoral (deep venous thrombosis) ([place]) 2018   in lung also.  treated with xarelto for 6 months  . GERD (gastroesophageal reflux disease)   . 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 History of hiatal hernia   . History of kidney stones 2019  . History of methicillin resistant staphylococcus aureus (MRSA) 2013   in neck due to ingrown hair. had surgery to drain cyst  . History of palpitations 2019   due to a panic attack  . Hypertension   . Lung nodule 2019   being followed by dr. Grayland Ormond, dr Genevive Bi and dr. Mortimer Fries  . Pneumonia 04/2016  . PONV (postoperative nausea and vomiting)    NAUSEATED  . Pulmonary emboli ([place]) 2018  . Sleep apnea    CPAP  . Vertigo     PAST SURGICAL HISTORY: Past Surgical History:  Procedure Laterality Date  . ABDOMINAL HYSTERECTOMY  1994  . CESAREAN SECTION    . CHOLECYSTECTOMY  2013  . CYST REMOVAL NECK  2013   was ingrown hair . positive for mrsa at that time prior to removal in OR  . CYSTOSCOPY WITH URETEROSCOPY AND STENT PLACEMENT Right 05/31/2018   Procedure: CYSTOSCOPY WITH URETEROSCOPY, LASER LITHOTRIPSY AND STENT PLACEMENT;  Surgeon: Billey Co, MD;  Location: [facility] ORS;  Service: Urology;  Laterality: Right;  . ELECTROMAGNETIC NAVIGATION BROCHOSCOPY N/A 08/15/2016   Procedure: ELECTROMAGNETIC NAVIGATION BRONCHOSCOPY;  Surgeon: Flora Lipps, MD;  Location: [facility] ORS;  Service: Cardiopulmonary;  Laterality: N/A;  . ELECTROMAGNETIC NAVIGATION BROCHOSCOPY Right 11/07/2017   Procedure: ELECTROMAGNETIC NAVIGATION BRONCHOSCOPY;  Surgeon: Flora Lipps, MD;  Location: [facility] ORS;  Service: Cardiopulmonary;  Laterality: Right;  .  LAPAROSCOPIC GASTRIC SLEEVE RESECTION  2015   lost 75 pounds    FAMILY HISTORY: Family History  Problem Relation Age of Onset  . Stroke Father   . Ovarian cancer Maternal Grandmother   . Breast cancer Neg Hx     ADVANCED DIRECTIVES (Y/N):  N  HEALTH MAINTENANCE: Social History   Tobacco Use  . Smoking status: Former  Smoker    Packs/day: 1.00    Years: 5.00    Pack years: 5.00    Types: Cigarettes    Quit date: 08/12/1991    Years since quitting: 27.9  . Smokeless tobacco: Never Used  Substance Use Topics  . Alcohol use: No  . Drug use: No     Colonoscopy:  PAP:  Bone density:  Lipid panel:  Allergies  Allergen Reactions  . Ibuprofen Other (See Comments)    Cannot Use due to Gastric SLEEVE SURGERY  . Levaquin [Levofloxacin In D5w] Hives  . Sulfamethoxazole-Trimethoprim Hives  . Nitroglycerin Other (See Comments)    Severe Hypotension    Current Outpatient Medications  Medication Sig Dispense Refill  . albuterol (PROAIR HFA) 108 (90 Base) MCG/ACT inhaler Inhale 2 Inhalers into the lungs as needed for wheezing or shortness of breath.     Marland Kitchen atorvastatin (LIPITOR) 80 MG tablet Take 80 mg by mouth at bedtime.   2  . busPIRone (BUSPAR) 5 MG tablet Take 5 mg by mouth 2 (two) times daily.     Marland Kitchen FLOVENT HFA 220 MCG/ACT inhaler Inhale 2 puffs into the lungs daily.   5  . hydrOXYzine (VISTARIL) 25 MG capsule Take 50 mg by mouth 2 (two) times daily.     . lansoprazole (PREVACID) 15 MG capsule Take 15 mg by mouth at bedtime.     Marland Kitchen lisinopril (PRINIVIL,ZESTRIL) 5 MG tablet Take 5 mg by mouth daily.  3  . metFORMIN (GLUCOPHAGE-XR) 500 MG 24 hr tablet Take 1,000 mg by mouth 2 (two) times daily.   2  . ONETOUCH VERIO test strip TEST ONCE D 100 each 3  . sertraline (ZOLOFT) 100 MG tablet Take 100 mg by mouth daily.  3   No current facility-administered medications for this visit.    OBJECTIVE: There were no vitals filed for this visit.   There is no height or weight on file to calculate BMI.    ECOG FS:0 - Asymptomatic  General: Well-developed, well-nourished, no acute distress. Eyes: Pink conjunctiva, anicteric sclera. HEENT: Normocephalic, moist mucous membranes. Lungs: Clear to auscultation bilaterally. Heart: Regular rate and rhythm. No rubs, murmurs, or gallops. Abdomen: Soft, nontender,  nondistended. No organomegaly noted, normoactive bowel sounds. Musculoskeletal: No edema, cyanosis, or clubbing. Neuro: Alert, answering all questions appropriately. Cranial nerves grossly intact. Skin: No rashes or petechiae noted. Psych: Normal affect.  LAB RESULTS:  Lab Results  Component Value Date   NA 137 09/21/2018   K 4.1 09/21/2018   CL 101 09/21/2018   CO2 26 09/21/2018   GLUCOSE 348 (H) 09/21/2018   BUN 13 09/21/2018   CREATININE 0.60 02/03/2019   CALCIUM 9.1 09/21/2018   PROT 7.0 05/15/2018   ALBUMIN 3.9 05/15/2018   AST 14 (L) 05/15/2018   ALT 18 05/15/2018   ALKPHOS 89 05/15/2018   BILITOT 0.9 05/15/2018   GFRNONAA >60 09/21/2018   GFRAA >60 09/21/2018    Lab Results  Component Value Date   WBC 6.5 09/21/2018   NEUTROABS 4.1 09/21/2018   HGB 14.9 09/21/2018   HCT 43.8 09/21/2018   MCV  78.6 (L) 09/21/2018   PLT 196 09/21/2018     STUDIES: No results found.  ASSESSMENT: Mass of upper lobe of right lung  PLAN:    1. Mass of upper lobe of right lung: Patient underwent biopsy on November 07, 2017 that was negative for malignancy.  Despite the negative biopsy, there was still concern for malignancy.  CT scan results from February 03, 2019 reviewed independently and reported as above with persistent right upper lobe lung nodule that is unchanged for at least 1 year.  She also has scattered bilateral pulmonary nodules measuring up to 7 mm that are mildly progressive.  No intervention is needed at this time, patient acknowledges she may have to have repeat biopsy in the future.  Continue to monitor every 6 months.  Because of patient's increased anxiety, she continues to request imaging and clinic visits be on the same day. 2. Bilateral PE/left leg DVT: Resolved.  Patient is now completed anticoagulation.  Diagnosed by CT scan on November 28, 2016. Patient has no obvious transient risk factors. Hypercoagulable workup was negative except for an elevated DRVVT which can be  attributed to the anticoagulation she is taking at that time.  I spent a total of 20 minutes face-to-face with the patient of which greater than 50% of the visit was spent in counseling and coordination of care as detailed above.   Patient expressed understanding and was in agreement with this plan. She also understands that She can call clinic at any time with any questions, concerns, or complaints.   Cancer Staging No matching staging information was found for the patient.  Lloyd Huger, MD   07/31/2019 5:58 PM

## 2019-08-07 ENCOUNTER — Ambulatory Visit: Payer: Managed Care, Other (non HMO)

## 2019-08-07 ENCOUNTER — Inpatient Hospital Stay: Payer: Managed Care, Other (non HMO) | Admitting: Oncology

## 2019-08-16 ENCOUNTER — Emergency Department: Payer: Managed Care, Other (non HMO)

## 2019-08-16 ENCOUNTER — Other Ambulatory Visit: Payer: Self-pay

## 2019-08-16 ENCOUNTER — Emergency Department
Admission: EM | Admit: 2019-08-16 | Discharge: 2019-08-16 | Disposition: A | Discharge status: Home / Self Care | Payer: Managed Care, Other (non HMO) | Attending: Emergency Medicine | Admitting: Emergency Medicine

## 2019-08-16 DIAGNOSIS — E119 Type 2 diabetes mellitus without complications: Secondary | ICD-10-CM | POA: Insufficient documentation

## 2019-08-16 DIAGNOSIS — I10 Essential (primary) hypertension: Secondary | ICD-10-CM | POA: Diagnosis not present

## 2019-08-16 DIAGNOSIS — Z79899 Other long term (current) drug therapy: Secondary | ICD-10-CM | POA: Diagnosis not present

## 2019-08-16 DIAGNOSIS — Z7984 Long term (current) use of oral hypoglycemic drugs: Secondary | ICD-10-CM | POA: Diagnosis not present

## 2019-08-16 DIAGNOSIS — S63502A Unspecified sprain of left wrist, initial encounter: Secondary | ICD-10-CM | POA: Diagnosis not present

## 2019-08-16 DIAGNOSIS — I2510 Atherosclerotic heart disease of native coronary artery without angina pectoris: Secondary | ICD-10-CM | POA: Diagnosis not present

## 2019-08-16 DIAGNOSIS — J449 Chronic obstructive pulmonary disease, unspecified: Secondary | ICD-10-CM | POA: Insufficient documentation

## 2019-08-16 DIAGNOSIS — W01198A Fall on same level from slipping, tripping and stumbling with subsequent striking against other object, initial encounter: Secondary | ICD-10-CM | POA: Diagnosis not present

## 2019-08-16 DIAGNOSIS — Y999 Unspecified external cause status: Secondary | ICD-10-CM | POA: Insufficient documentation

## 2019-08-16 DIAGNOSIS — Y939 Activity, unspecified: Secondary | ICD-10-CM | POA: Diagnosis not present

## 2019-08-16 DIAGNOSIS — Z87891 Personal history of nicotine dependence: Secondary | ICD-10-CM | POA: Insufficient documentation

## 2019-08-16 DIAGNOSIS — S6992XA Unspecified injury of left wrist, hand and finger(s), initial encounter: Secondary | ICD-10-CM | POA: Diagnosis present

## 2019-08-16 DIAGNOSIS — Y929 Unspecified place or not applicable: Secondary | ICD-10-CM | POA: Diagnosis not present

## 2019-08-16 MED ORDER — HYDROMORPHONE HCL 1 MG/ML IJ SOLN
1.0000 mg | Freq: Once | INTRAMUSCULAR | Status: AC
  Administered 2019-08-16: 1 mg via INTRAMUSCULAR
  Filled 2019-08-16: qty 1

## 2019-08-16 MED ORDER — OXYCODONE-ACETAMINOPHEN 5-325 MG PO TABS: 1.0000 | ORAL_TABLET | Freq: Four times a day (QID) | ORAL | 0 refills | Status: AC | PRN

## 2019-08-16 NOTE — ED Triage Notes (Signed)
Pt states fell against a brick pillar and then slide down a ditch. C/o L wrist pain. Swelling noted. Blood noted to clothes. No active bleeding found. Bruises to L FA.

## 2019-08-16 NOTE — Discharge Instructions (Addendum)
Follow discharge care instruction and wear splint for 3 to 5 days.  Be advised medication may cause drowsiness.

## 2019-08-16 NOTE — ED Provider Notes (Signed)
[facility] Emergency Department Provider Note   ____________________________________________   First MD Initiated Contact with Patient 08/16/19 1246     (approximate)  I have reviewed the triage vital signs and the nursing notes.   HISTORY  Chief Complaint Wrist Pain    HPI Andrea Pollard is a 64 y.o. female patient complain of left wrist/hand pain secondary to fall against a brick pillar and then sliding down to a ditch.  Incident occurred prior to arrival.  Patient denies loss sensation but decreased range of motion with flexion extension of the wrist.  Patient rates pain as 8/10.  Patient described pain as "achy".  No palliative measure prior to arrival.  Patient has history of DVT but is currently not taking any blood thinners.         Past Medical History:  Diagnosis Date  . Anxiety   . Asthma   . Complication of anesthesia   . COPD (chronic obstructive pulmonary disease) (HCC)    diagnosed from pulmonary function tests  . Depression   . Diabetes mellitus without complication ([place])   . Dvt femoral (deep venous thrombosis) ([place]) 2018   in lung also.  treated with xarelto for 6 months  . GERD (gastroesophageal reflux disease)   . Hepatitis    AGE 26-HEPATITIS B  . History of hiatal hernia   . History of kidney stones 2019  . History of methicillin resistant staphylococcus aureus (MRSA) 2013   in neck due to ingrown hair. had surgery to drain cyst  . History of palpitations 2019   due to a panic attack  . Hypertension   . Lung nodule 2019   being followed by dr. Grayland Ormond, dr Genevive Bi and dr. Mortimer Fries  . Pneumonia 04/2016  . PONV (postoperative nausea and vomiting)    NAUSEATED  . Pulmonary emboli ([place]) 2018  . Sleep apnea    CPAP  . Vertigo     Patient Active Problem List   Diagnosis Date Noted  . Pulmonary embolism ([place]) 03/04/2017  . Benign hypertension 10/05/2016  . Constipation 10/05/2016  . Gastroesophageal reflux disease  10/05/2016  . Hypercholesterolemia 10/05/2016  . Indigestion 10/05/2016  . Obstructive sleep apnea of adult 10/05/2016  . Type 2 diabetes mellitus ([place]) 10/05/2016  . Vitamin D deficiency 10/05/2016  . Coronary artery disease of native artery of native heart with stable angina pectoris ([place]) 09/27/2016  . Aortic atherosclerosis ([place]) 09/27/2016  . History of smoking 09/27/2016  . Lung nodule 06/07/2016  . Morbid obesity ([place]) 04/23/2014    Past Surgical History:  Procedure Laterality Date  . ABDOMINAL HYSTERECTOMY  1994  . CESAREAN SECTION    . CHOLECYSTECTOMY  2013  . CYST REMOVAL NECK  2013   was ingrown hair . positive for mrsa at that time prior to removal in OR  . CYSTOSCOPY WITH URETEROSCOPY AND STENT PLACEMENT Right 05/31/2018   Procedure: CYSTOSCOPY WITH URETEROSCOPY, LASER LITHOTRIPSY AND STENT PLACEMENT;  Surgeon: Billey Co, MD;  Location: [facility] ORS;  Service: Urology;  Laterality: Right;  . ELECTROMAGNETIC NAVIGATION BROCHOSCOPY N/A 08/15/2016   Procedure: ELECTROMAGNETIC NAVIGATION BRONCHOSCOPY;  Surgeon: Flora Lipps, MD;  Location: [facility] ORS;  Service: Cardiopulmonary;  Laterality: N/A;  . ELECTROMAGNETIC NAVIGATION BROCHOSCOPY Right 11/07/2017   Procedure: ELECTROMAGNETIC NAVIGATION BRONCHOSCOPY;  Surgeon: Flora Lipps, MD;  Location: [facility] ORS;  Service: Cardiopulmonary;  Laterality: Right;  . LAPAROSCOPIC GASTRIC SLEEVE RESECTION  2015   lost 75 pounds    Prior to Admission medications  Medication Sig Start Date End Date Taking? Authorizing Provider  albuterol (PROAIR HFA) 108 (90 Base) MCG/ACT inhaler Inhale 2 Inhalers into the lungs as needed for wheezing or shortness of breath.     [provider]  atorvastatin (LIPITOR) 80 MG tablet Take 80 mg by mouth at bedtime.  08/30/16   [provider]  busPIRone (BUSPAR) 5 MG tablet Take 5 mg by mouth 2 (two) times daily.     [provider]  FLOVENT HFA 220 MCG/ACT inhaler Inhale 2 puffs into  the lungs daily.  12/11/16   [provider]  hydrOXYzine (VISTARIL) 25 MG capsule Take 50 mg by mouth 2 (two) times daily.     [provider]  lansoprazole (PREVACID) 15 MG capsule Take 15 mg by mouth at bedtime.     [provider]  lisinopril (PRINIVIL,ZESTRIL) 5 MG tablet Take 5 mg by mouth daily. 12/26/17   [provider]  metFORMIN (GLUCOPHAGE-XR) 500 MG 24 hr tablet Take 1,000 mg by mouth 2 (two) times daily.  03/23/16   [provider]  [facility] VERIO test strip TEST ONCE D 09/21/18   Schuyler Amor, MD  oxyCODONE-acetaminophen (PERCOCET) 5-325 MG tablet Take 1 tablet by mouth every 6 (six) hours as needed for up to 5 days for severe pain. 08/16/19 08/21/19  Sable Feil, PA-C  sertraline (ZOLOFT) 100 MG tablet Take 100 mg by mouth daily. 12/03/17   [provider]    Allergies Ibuprofen, Levaquin [levofloxacin in d5w], Sulfamethoxazole-trimethoprim, and Nitroglycerin  Family History  Problem Relation Age of Onset  . Stroke Father   . Ovarian cancer Maternal Grandmother   . Breast cancer Neg Hx     Social History Social History   Tobacco Use  . Smoking status: Former Smoker    Packs/day: 1.00    Years: 5.00    Pack years: 5.00    Types: Cigarettes    Quit date: 08/12/1991    Years since quitting: 28.0  . Smokeless tobacco: Never Used  Substance Use Topics  . Alcohol use: No  . Drug use: No    Review of Systems Constitutional: No fever/chills Eyes: No visual changes. ENT: No sore throat. Cardiovascular: Denies chest pain. Respiratory: Denies shortness of breath. Gastrointestinal: No abdominal pain.  No nausea, no vomiting.  No diarrhea.  No constipation. Genitourinary: Negative for dysuria. Musculoskeletal: Negative for back pain. Skin: Negative for rash. Neurological: Negative for headaches, focal weakness or numbness. Endocrine:  Diabetes, hyperlipidemia, hypertension. Allergic/Immunilogical: Ibuprofen,  Levaquin, Bactrim, and nitroglycerin.  ____________________________________________   PHYSICAL EXAM:  VITAL SIGNS: ED Triage Vitals  Enc Vitals Group     BP 08/16/19 1242 131/77     Pulse Rate 08/16/19 1242 75     Resp 08/16/19 1242 16     Temp 08/16/19 1242 98.4 F (36.9 C)     Temp Source 08/16/19 1242 Oral     SpO2 08/16/19 1242 98 %     Weight 08/16/19 1244 230 lb (104.3 kg)     Height 08/16/19 1244 5\' 5"  (1.651 m)     Head Circumference --      Peak Flow --      Pain Score 08/16/19 1242 8     Pain Loc --      Pain Edu? --      Excl. in [place]? --     Constitutional: Alert and oriented. Well appearing and in no acute distress. Neck:   No cervical spine tenderness to  palpation. Cardiovascular: Normal rate, regular rhythm. Grossly normal heart sounds.  Good peripheral circulation. Respiratory: Normal respiratory effort.  No retractions. Lungs CTAB. Gastrointestinal: Soft and nontender. No distention. No abdominal bruits. No CVA tenderness. Genitourinary: Deferred Musculoskeletal: No lower extremity tenderness nor edema.  No joint effusions. Neurologic:  Normal speech and language. No gross focal neurologic deficits are appreciated. No gait instability. Skin:  Skin is warm, dry and intact. No rash noted.  Ecchymosis abrasions left wrist and forearm. Psychiatric: Mood and affect are normal. Speech and behavior are normal.  ____________________________________________   LABS (all labs ordered are listed, but only abnormal results are displayed)  Labs Reviewed - No data to display ____________________________________________  EKG   ____________________________________________  RADIOLOGY  ED MD interpretation:    Official radiology report(s): DG Wrist Complete Left  Result Date: 08/16/2019 CLINICAL DATA:  Status post fall with pain and swelling of the left wrist. EXAM: LEFT WRIST - COMPLETE 3+ VIEW COMPARISON:  None. FINDINGS: No acute fracture or dislocation is  identified. Degenerative joint changes in chronic changes of the distal radius and carpal bones are noted. IMPRESSION: No acute fracture or dislocation. Electronically Signed   By: Abelardo Diesel M.D.   On: 08/16/2019 13:45   DG Hand Complete Left  Result Date: 08/16/2019 CLINICAL DATA:  Status post fall with left hand pain. EXAM: LEFT HAND - COMPLETE 3+ VIEW COMPARISON:  None. FINDINGS: There is no evidence of fracture or dislocation. There degenerative joint changes of the distal radius and carpal bones are noted. Soft tissues are unremarkable. IMPRESSION: No acute fracture or dislocation. Electronically Signed   By: Abelardo Diesel M.D.   On: 08/16/2019 13:46    ____________________________________________   PROCEDURES  Procedure(s) performed (including Critical Care):  Procedures   ____________________________________________   INITIAL IMPRESSION / ASSESSMENT AND PLAN / ED COURSE  As part of my medical decision making, I reviewed the following data within the [place]     Patient presents with left wrist and hand pain secondary to fall.  Discussed negative x-ray findings with patient.  Patient placed in a volar splint and given discharge care instruction.  Take medication as directed and be advised may cause drowsiness.  Advised to follow-up with PCP as needed.    DELON AHR was evaluated in Emergency Department on 08/16/2019 for the symptoms described in the history of present illness. She was evaluated in the context of the global COVID-19 pandemic, which necessitated consideration that the patient might be at risk for infection with the SARS-CoV-2 virus that causes COVID-19. Institutional protocols and algorithms that pertain to the evaluation of patients at risk for COVID-19 are in a state of rapid change based on information released by regulatory bodies including the CDC and federal and state organizations. These policies and algorithms were followed during  the patient's care in the ED.       ____________________________________________   FINAL CLINICAL IMPRESSION(S) / ED DIAGNOSES  Final diagnoses:  Sprain of left wrist, initial encounter     ED Discharge Orders         Ordered    oxyCODONE-acetaminophen (PERCOCET) 5-325 MG tablet  Every 6 hours PRN     08/16/19 1426           Note:  This document was prepared using Dragon voice recognition software and may include unintentional dictation errors.    Sable Feil, PA-C 08/16/19 1433    Blake Divine, MD 08/17/19 [phone]

## 2019-08-16 NOTE — ED Notes (Signed)
Pt slipped and hit a brick pillar as she went down. Swelling to left are with bruises and abrasions noted.

## 2019-08-16 NOTE — ED Notes (Signed)
First Nurse Note: Pt to ED via POV s/p fall. Pt has deformity to left hand. Pt is in NAD.

## 2019-08-21 NOTE — Progress Notes (Signed)
[place]  [phone] [phone]  ID: Andrea Pollard OB: December 06, 1955  MR#: [id number]  [id number]  Patient Care Team: Ellamae Sia, MD as PCP - General (Internal Medicine) Minna Merritts, MD as Consulting Physician (Cardiology) Telford Nab, RN as Registered Nurse  CHIEF COMPLAINT: Mass of upper lobe of right lung.  INTERVAL HISTORY: Patient returns to clinic today for routine 47-month evaluation and discussion of her imaging results.  She continues to be anxious, but otherwise feels well.  She has no neurologic complaints. She denies any recent fevers or illnesses. She has a good appetite and denies weight loss. She she denies any shortness of breath, cough, chest pain, or hemoptysis. She denies any nausea, vomiting, constipation, or diarrhea. She has no urinary complaints.  Patient offers no further specific complaints today.  REVIEW OF SYSTEMS:   Review of Systems  Constitutional: Negative.  Negative for fever, malaise/fatigue and weight loss.  Respiratory: Negative.  Negative for cough, hemoptysis and shortness of breath.   Cardiovascular: Negative.  Negative for chest pain and leg swelling.  Gastrointestinal: Negative.  Negative for abdominal pain.  Genitourinary: Negative.  Negative for dysuria.  Musculoskeletal: Negative.  Negative for back pain.  Skin: Negative.  Negative for rash.  Neurological: Negative.  Negative for sensory change, focal weakness and weakness.  Psychiatric/Behavioral: The patient is nervous/anxious. The patient does not have insomnia.     As per HPI. Otherwise, a complete review of systems is negative.  PAST MEDICAL HISTORY: Past Medical History:  Diagnosis Date  . Anxiety   . Asthma   . Complication of anesthesia   . COPD (chronic obstructive pulmonary disease) (HCC)    diagnosed from pulmonary function tests  . Depression   . Diabetes mellitus without complication ([place])   . Dvt femoral  (deep venous thrombosis) ([place]) 2018   in lung also.  treated with xarelto for 6 months  . GERD (gastroesophageal reflux disease)   . 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 History of hiatal hernia   . History of kidney stones 2019  . History of methicillin resistant staphylococcus aureus (MRSA) 2013   in neck due to ingrown hair. had surgery to drain cyst  . History of palpitations 2019   due to a panic attack  . Hypertension   . Lung nodule 2019   being followed by dr. Grayland Ormond, dr Genevive Bi and dr. Mortimer Fries  . Pneumonia 04/2016  . PONV (postoperative nausea and vomiting)    NAUSEATED  . Pulmonary emboli ([place]) 2018  . Sleep apnea    CPAP  . Vertigo     PAST SURGICAL HISTORY: Past Surgical History:  Procedure Laterality Date  . ABDOMINAL HYSTERECTOMY  1994  . CESAREAN SECTION    . CHOLECYSTECTOMY  2013  . CYST REMOVAL NECK  2013   was ingrown hair . positive for mrsa at that time prior to removal in OR  . CYSTOSCOPY WITH URETEROSCOPY AND STENT PLACEMENT Right 05/31/2018   Procedure: CYSTOSCOPY WITH URETEROSCOPY, LASER LITHOTRIPSY AND STENT PLACEMENT;  Surgeon: Billey Co, MD;  Location: [facility] ORS;  Service: Urology;  Laterality: Right;  . ELECTROMAGNETIC NAVIGATION BROCHOSCOPY N/A 08/15/2016   Procedure: ELECTROMAGNETIC NAVIGATION BRONCHOSCOPY;  Surgeon: Flora Lipps, MD;  Location: [facility] ORS;  Service: Cardiopulmonary;  Laterality: N/A;  . ELECTROMAGNETIC NAVIGATION BROCHOSCOPY Right 11/07/2017   Procedure: ELECTROMAGNETIC NAVIGATION BRONCHOSCOPY;  Surgeon: Flora Lipps, MD;  Location: [facility] ORS;  Service: Cardiopulmonary;  Laterality: Right;  . LAPAROSCOPIC GASTRIC  SLEEVE RESECTION  2015   lost 75 pounds    FAMILY HISTORY: Family History  Problem Relation Age of Onset  . Stroke Father   . Ovarian cancer Maternal Grandmother   . Breast cancer Neg Hx     ADVANCED DIRECTIVES (Y/N):  N  HEALTH MAINTENANCE: Social History   Tobacco Use  . Smoking status: Former Smoker     Packs/day: 1.00    Years: 5.00    Pack years: 5.00    Types: Cigarettes    Quit date: 08/12/1991    Years since quitting: 28.0  . Smokeless tobacco: Never Used  Substance Use Topics  . Alcohol use: No  . Drug use: No     Colonoscopy:  PAP:  Bone density:  Lipid panel:  Allergies  Allergen Reactions  . Ibuprofen Other (See Comments)    Cannot Use due to Gastric SLEEVE SURGERY  . Levaquin [Levofloxacin In D5w] Hives  . Sulfamethoxazole-Trimethoprim Hives  . Nitroglycerin Other (See Comments)    Severe Hypotension    Current Outpatient Medications  Medication Sig Dispense Refill  . albuterol (PROAIR HFA) 108 (90 Base) MCG/ACT inhaler Inhale 2 Inhalers into the lungs as needed for wheezing or shortness of breath.     Marland Kitchen atorvastatin (LIPITOR) 80 MG tablet Take 80 mg by mouth at bedtime.   2  . busPIRone (BUSPAR) 5 MG tablet Take 5 mg by mouth 2 (two) times daily.     . cholecalciferol (VITAMIN D3) 25 MCG (1000 UNIT) tablet Take 1,000 Units by mouth daily.    Marland Kitchen ELDERBERRY PO Take 1 tablet by mouth daily.    Marland Kitchen FLOVENT HFA 220 MCG/ACT inhaler Inhale 2 puffs into the lungs daily.   5  . hydrOXYzine (VISTARIL) 25 MG capsule Take 50 mg by mouth 2 (two) times daily.     . lansoprazole (PREVACID) 15 MG capsule Take 15 mg by mouth at bedtime.     Marland Kitchen lisinopril (PRINIVIL,ZESTRIL) 5 MG tablet Take 5 mg by mouth daily.  3  . metFORMIN (GLUCOPHAGE-XR) 500 MG 24 hr tablet Take 1,000 mg by mouth 2 (two) times daily.   2  . ONETOUCH VERIO test strip TEST ONCE D 100 each 3  . sertraline (ZOLOFT) 100 MG tablet Take 100 mg by mouth daily.  3   No current facility-administered medications for this visit.    OBJECTIVE: Vitals:   08/25/19 1419  BP: 133/81  Pulse: 82  Temp: (!) 96.6 F (35.9 C)  SpO2: 97%     Body mass index is 37.44 kg/m.    ECOG FS:0 - Asymptomatic  General: Well-developed, well-nourished, no acute distress. Eyes: Pink conjunctiva, anicteric sclera. HEENT:  Normocephalic, moist mucous membranes. Lungs: No audible wheezing or coughing. Heart: Regular rate and rhythm. Abdomen: Soft, nontender, no obvious distention. Musculoskeletal: No edema, cyanosis, or clubbing. Neuro: Alert, answering all questions appropriately. Cranial nerves grossly intact. Skin: No rashes or petechiae noted. Psych: Normal affect.   LAB RESULTS:  Lab Results  Component Value Date   NA 137 09/21/2018   K 4.1 09/21/2018   CL 101 09/21/2018   CO2 26 09/21/2018   GLUCOSE 348 (H) 09/21/2018   BUN 13 09/21/2018   CREATININE 0.60 08/25/2019   CALCIUM 9.1 09/21/2018   PROT 7.0 05/15/2018   ALBUMIN 3.9 05/15/2018   AST 14 (L) 05/15/2018   ALT 18 05/15/2018   ALKPHOS 89 05/15/2018   BILITOT 0.9 05/15/2018   GFRNONAA >60 09/21/2018   GFRAA >60 09/21/2018  Lab Results  Component Value Date   WBC 6.5 09/21/2018   NEUTROABS 4.1 09/21/2018   HGB 14.9 09/21/2018   HCT 43.8 09/21/2018   MCV 78.6 (L) 09/21/2018   PLT 196 09/21/2018     STUDIES: DG Wrist Complete Left  Result Date: 08/16/2019 CLINICAL DATA:  Status post fall with pain and swelling of the left wrist. EXAM: LEFT WRIST - COMPLETE 3+ VIEW COMPARISON:  None. FINDINGS: No acute fracture or dislocation is identified. Degenerative joint changes in chronic changes of the distal radius and carpal bones are noted. IMPRESSION: No acute fracture or dislocation. Electronically Signed   By: Abelardo Diesel M.D.   On: 08/16/2019 13:45   CT Chest W Contrast  Result Date: 08/25/2019 CLINICAL DATA:  Right lung nodule follow-up EXAM: CT CHEST WITH CONTRAST TECHNIQUE: Multidetector CT imaging of the chest was performed during intravenous contrast administration. CONTRAST:  17mL OMNIPAQUE IOHEXOL 300 MG/ML  SOLN COMPARISON:  02/03/2019, 07/22/2018, 02/12/2018, 11/21/2017, 07/19/2017, 04/09/2017, PET-CT, 08/07/2016 FINDINGS: Cardiovascular: Aortic atherosclerosis. Normal heart size. Three-vessel coronary artery  calcifications. No pericardial effusion. Mediastinum/Nodes: No enlarged mediastinal, hilar, or axillary lymph nodes. Thyroid gland, trachea, and esophagus demonstrate no significant findings. Lungs/Pleura: Unchanged dominant spiculated nodule of the inferior right upper lobe measuring 1.5 x 1.1 cm (series 3, image 58). Unchanged spiculated 7 mm nodule of the medial segment right middle lobe (series 3, image 73). Numerous additional small pulmonary nodules throughout the lungs are likewise unchanged, measuring 5 mm or smaller (series 3, image 85). No pleural effusion or pneumothorax. Upper Abdomen: No acute abnormality.  Hepatic steatosis. Musculoskeletal: No chest wall mass or suspicious bone lesions identified. IMPRESSION: 1. Unchanged dominant spiculated nodule of the inferior right upper lobe measuring 1.5 cm and a spiculated nodule of the medial segment right middle lobe measuring 7 mm. Although these have demonstrated indolent behavior, by morphology they remain somewhat suspicious for neoplasm and the larger nodule in the right upper lobe previously demonstrated FDG PET avidity. Consider tissue sampling or at minimum continued attention on follow-up. 2. There is a background of numerous additional small pulmonary nodules not significantly changed and generally consistent with sequelae of infection or inflammation. 3.  Coronary artery disease.  Aortic Atherosclerosis (ICD10-I70.0). 4.  Hepatic steatosis. Electronically Signed   By: Eddie Candle M.D.   On: 08/25/2019 08:53   DG Hand Complete Left  Result Date: 08/16/2019 CLINICAL DATA:  Status post fall with left hand pain. EXAM: LEFT HAND - COMPLETE 3+ VIEW COMPARISON:  None. FINDINGS: There is no evidence of fracture or dislocation. There degenerative joint changes of the distal radius and carpal bones are noted. Soft tissues are unremarkable. IMPRESSION: No acute fracture or dislocation. Electronically Signed   By: Abelardo Diesel M.D.   On: 08/16/2019  13:46    ASSESSMENT: Mass of upper lobe of right lung  PLAN:    1. Mass of upper lobe of right lung: Patient underwent biopsy on November 07, 2017 that was negative for malignancy.  Despite the negative biopsy, there was still concern for malignancy.  CT scan results from August 25, 2018 reviewed independently and reported as above with persistent yet unchanged right upper lobe lesion.  No intervention is needed at this time, patient acknowledges she may have to have repeat biopsy in the future.  Because of patient's increased anxiety, she continues to request imaging and clinic visits be on the same day.  Return to clinic in 6 months with repeat imaging and further evaluation. 2. Bilateral  PE/left leg DVT: Resolved.  Patient has now completed anticoagulation.  Diagnosed by CT scan on November 28, 2016. Patient has no obvious transient risk factors. Hypercoagulable workup was negative except for an elevated DRVVT which can be attributed to the anticoagulation she is taking at that time.  I spent a total of 20 minutes reviewing chart data, face-to-face evaluation with the patient, counseling and coordination of care as detailed above.   Patient expressed understanding and was in agreement with this plan. She also understands that She can call clinic at any time with any questions, concerns, or complaints.   Cancer Staging No matching staging information was found for the patient.  Lloyd Huger, MD   08/26/2019 6:53 AM

## 2019-08-22 ENCOUNTER — Ambulatory Visit: Payer: Managed Care, Other (non HMO) | Admitting: Oncology

## 2019-08-25 ENCOUNTER — Encounter: Payer: Self-pay | Admitting: Oncology

## 2019-08-25 ENCOUNTER — Inpatient Hospital Stay: Payer: Managed Care, Other (non HMO) | Attending: Oncology | Admitting: Oncology

## 2019-08-25 ENCOUNTER — Ambulatory Visit
Admission: RE | Admit: 2019-08-25 | Discharge: 2019-08-25 | Disposition: A | Discharge status: Home / Self Care | Payer: Managed Care, Other (non HMO) | Source: Ambulatory Visit | Attending: Oncology | Admitting: Oncology

## 2019-08-25 ENCOUNTER — Other Ambulatory Visit: Payer: Self-pay

## 2019-08-25 VITALS — BP 133/81 | HR 82 | Temp 96.6°F | Wt 225.0 lb

## 2019-08-25 DIAGNOSIS — R918 Other nonspecific abnormal finding of lung field: Secondary | ICD-10-CM | POA: Diagnosis not present

## 2019-08-25 DIAGNOSIS — R911 Solitary pulmonary nodule: Secondary | ICD-10-CM | POA: Insufficient documentation

## 2019-08-25 LAB — POCT I-STAT CREATININE: Creatinine, Ser: 0.6 mg/dL (ref 0.44–1.00)

## 2019-08-25 MED ORDER — IOHEXOL 300 MG/ML  SOLN
75.0000 mL | Freq: Once | INTRAMUSCULAR | Status: AC | PRN
  Administered 2019-08-25: 75 mL via INTRAVENOUS

## 2019-08-25 NOTE — Progress Notes (Signed)
Patient is here for 6 month follow up and would like to discuss CT scan results. She would also like to know if there is a way to know if its her asthma or lung nodule causing breathing trouble.

## 2019-11-03 ENCOUNTER — Emergency Department
Admission: EM | Admit: 2019-11-03 | Discharge: 2019-11-03 | Disposition: A | Discharge status: Home / Self Care | Payer: Managed Care, Other (non HMO) | Attending: Student | Admitting: Student

## 2019-11-03 ENCOUNTER — Emergency Department: Payer: Managed Care, Other (non HMO)

## 2019-11-03 ENCOUNTER — Encounter: Payer: Self-pay | Admitting: Emergency Medicine

## 2019-11-03 DIAGNOSIS — I25118 Atherosclerotic heart disease of native coronary artery with other forms of angina pectoris: Secondary | ICD-10-CM | POA: Insufficient documentation

## 2019-11-03 DIAGNOSIS — Z79899 Other long term (current) drug therapy: Secondary | ICD-10-CM | POA: Insufficient documentation

## 2019-11-03 DIAGNOSIS — R1031 Right lower quadrant pain: Secondary | ICD-10-CM | POA: Diagnosis present

## 2019-11-03 DIAGNOSIS — E119 Type 2 diabetes mellitus without complications: Secondary | ICD-10-CM | POA: Diagnosis not present

## 2019-11-03 DIAGNOSIS — Z7984 Long term (current) use of oral hypoglycemic drugs: Secondary | ICD-10-CM | POA: Insufficient documentation

## 2019-11-03 DIAGNOSIS — R109 Unspecified abdominal pain: Secondary | ICD-10-CM

## 2019-11-03 DIAGNOSIS — Z87891 Personal history of nicotine dependence: Secondary | ICD-10-CM | POA: Diagnosis not present

## 2019-11-03 LAB — URINALYSIS, COMPLETE (UACMP) WITH MICROSCOPIC
Bilirubin Urine: NEGATIVE
Glucose, UA: NEGATIVE mg/dL
Hgb urine dipstick: NEGATIVE
Ketones, ur: NEGATIVE mg/dL
Leukocytes,Ua: NEGATIVE
Nitrite: NEGATIVE
Protein, ur: NEGATIVE mg/dL
Specific Gravity, Urine: 1.016 (ref 1.005–1.030)
pH: 7 (ref 5.0–8.0)

## 2019-11-03 LAB — HEPATIC FUNCTION PANEL
ALT: 25 U/L (ref 0–44)
AST: 29 U/L (ref 15–41)
Albumin: 3.9 g/dL (ref 3.5–5.0)
Alkaline Phosphatase: 78 U/L (ref 38–126)
Bilirubin, Direct: <0.1 mg/dL (ref 0.0–0.2)
Total Bilirubin: 0.5 mg/dL (ref 0.3–1.2)
Total Protein: 7.4 g/dL (ref 6.5–8.1)

## 2019-11-03 LAB — BASIC METABOLIC PANEL
Anion gap: 9 (ref 5–15)
BUN: 18 mg/dL (ref 8–23)
CO2: 31 mmol/L (ref 22–32)
Calcium: 9.4 mg/dL (ref 8.9–10.3)
Chloride: 101 mmol/L (ref 98–111)
Creatinine, Ser: 0.72 mg/dL (ref 0.44–1.00)
GFR calc Af Amer: >60 mL/min (ref >60)
GFR calc non Af Amer: >60 mL/min (ref >60)
Glucose, Bld: 144 mg/dL — ABNORMAL HIGH (ref 70–99)
Potassium: 4.3 mmol/L (ref 3.5–5.1)
Sodium: 141 mmol/L (ref 135–145)

## 2019-11-03 LAB — LIPASE, BLOOD: Lipase: 27 U/L (ref 11–51)

## 2019-11-03 LAB — CBC
HCT: 41.4 % (ref 36.0–46.0)
Hemoglobin: 13.9 g/dL (ref 12.0–15.0)
MCH: 26.4 pg (ref 26.0–34.0)
MCHC: 33.6 g/dL (ref 30.0–36.0)
MCV: 78.7 fL — ABNORMAL LOW (ref 80.0–100.0)
Platelets: 227 10*3/uL (ref 150–400)
RBC: 5.26 MIL/uL — ABNORMAL HIGH (ref 3.87–5.11)
RDW: 13.3 % (ref 11.5–15.5)
WBC: 8 10*3/uL (ref 4.0–10.5)
nRBC: 0 % (ref 0.0–0.2)

## 2019-11-03 MED ORDER — IOHEXOL 300 MG/ML  SOLN
100.0000 mL | Freq: Once | INTRAMUSCULAR | Status: AC | PRN
  Administered 2019-11-03: 100 mL via INTRAVENOUS
  Filled 2019-11-03: qty 100

## 2019-11-03 MED ORDER — MORPHINE SULFATE (PF) 4 MG/ML IV SOLN
4.0000 mg | Freq: Once | INTRAVENOUS | Status: AC
  Administered 2019-11-03: 4 mg via INTRAVENOUS
  Filled 2019-11-03: qty 1

## 2019-11-03 MED ORDER — DICYCLOMINE HCL 20 MG PO TABS: 20.0000 mg | ORAL_TABLET | Freq: Four times a day (QID) | ORAL | 0 refills | Status: DC | PRN

## 2019-11-03 MED ORDER — ONDANSETRON HCL 4 MG/2ML IJ SOLN
4.0000 mg | Freq: Once | INTRAMUSCULAR | Status: AC
  Administered 2019-11-03: 4 mg via INTRAVENOUS
  Filled 2019-11-03: qty 2

## 2019-11-03 MED ORDER — DICYCLOMINE HCL 10 MG PO CAPS
20.0000 mg | ORAL_CAPSULE | Freq: Once | ORAL | Status: AC
  Administered 2019-11-03: 20 mg via ORAL
  Filled 2019-11-03: qty 2

## 2019-11-03 NOTE — ED Triage Notes (Signed)
Pt from home with right flank pain today. Pt states she has hx of kidney stones but is not sure this is the same. Pt denies any urinary symptoms; has had nausea as well. Pt alert & oriented, nad noted.

## 2019-11-03 NOTE — Discharge Instructions (Addendum)
Thank you for letting us take care of you in the emergency department today.   Please continue to take any regular, prescribed medications.   New medications we have prescribed:  Bentyl, as needed for spasms/pain  Please follow up with: Your primary care doctor to review your ER visit and follow up on your symptoms.    Please return to the ER for any new or worsening symptoms.

## 2019-11-03 NOTE — ED Provider Notes (Signed)
[facility] Emergency Department Provider Note  ____________________________________________   First MD Initiated Contact with Patient 11/03/19 1439     (approximate)  I have reviewed the triage vital signs and the nursing notes.  History  Chief Complaint Flank Pain    HPI Andrea Pollard is a 64 y.o. female PMHx as below who presents to the ER for RLQ and R flank pain.  Symptoms started this morning and have been constant in onset.  Describes it as a sharp, aching pain.  Moderate in severity.  Located in the RLQ and radiates to the right flank.  No alleviating or aggravating components.  Associated with nausea, but no vomiting.  No diarrhea or changes to his bowel movements.  Denies any dysuria, hematuria, malodorous urine.  Does have a history of kidney stones, feels somewhat similar.  Status post cholecystectomy.   Past Medical Hx Past Medical History:  Diagnosis Date  . Anxiety   . Asthma   . Complication of anesthesia   . COPD (chronic obstructive pulmonary disease) (HCC)    diagnosed from pulmonary function tests  . Depression   . Diabetes mellitus without complication ([place])   . Dvt femoral (deep venous thrombosis) ([place]) 2018   in lung also.  treated with xarelto for 6 months  . GERD (gastroesophageal reflux disease)   . 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 History of hiatal hernia   . History of kidney stones 2019  . History of methicillin resistant staphylococcus aureus (MRSA) 2013   in neck due to ingrown hair. had surgery to drain cyst  . History of palpitations 2019   due to a panic attack  . Hypertension   . Lung nodule 2019   being followed by dr. Grayland Ormond, dr Genevive Bi and dr. Mortimer Fries  . Pneumonia 04/2016  . PONV (postoperative nausea and vomiting)    NAUSEATED  . Pulmonary emboli ([place]) 2018  . Sleep apnea    CPAP  . Vertigo     Problem List Patient Active Problem List   Diagnosis Date Noted  . Pulmonary embolism ([place]) 03/04/2017   . Benign hypertension 10/05/2016  . Constipation 10/05/2016  . Gastroesophageal reflux disease 10/05/2016  . Hypercholesterolemia 10/05/2016  . Indigestion 10/05/2016  . Obstructive sleep apnea of adult 10/05/2016  . Type 2 diabetes mellitus ([place]) 10/05/2016  . Vitamin D deficiency 10/05/2016  . Coronary artery disease of native artery of native heart with stable angina pectoris ([place]) 09/27/2016  . Aortic atherosclerosis ([place]) 09/27/2016  . History of smoking 09/27/2016  . Lung nodule 06/07/2016  . Morbid obesity ([place]) 04/23/2014    Past Surgical Hx Past Surgical History:  Procedure Laterality Date  . ABDOMINAL HYSTERECTOMY  1994  . CESAREAN SECTION    . CHOLECYSTECTOMY  2013  . CYST REMOVAL NECK  2013   was ingrown hair . positive for mrsa at that time prior to removal in OR  . CYSTOSCOPY WITH URETEROSCOPY AND STENT PLACEMENT Right 05/31/2018   Procedure: CYSTOSCOPY WITH URETEROSCOPY, LASER LITHOTRIPSY AND STENT PLACEMENT;  Surgeon: Billey Co, MD;  Location: [facility] ORS;  Service: Urology;  Laterality: Right;  . ELECTROMAGNETIC NAVIGATION BROCHOSCOPY N/A 08/15/2016   Procedure: ELECTROMAGNETIC NAVIGATION BRONCHOSCOPY;  Surgeon: Flora Lipps, MD;  Location: [facility] ORS;  Service: Cardiopulmonary;  Laterality: N/A;  . ELECTROMAGNETIC NAVIGATION BROCHOSCOPY Right 11/07/2017   Procedure: ELECTROMAGNETIC NAVIGATION BRONCHOSCOPY;  Surgeon: Flora Lipps, MD;  Location: [facility] ORS;  Service: Cardiopulmonary;  Laterality: Right;  . LAPAROSCOPIC GASTRIC  SLEEVE RESECTION  2015   lost 75 pounds    Medications Prior to Admission medications   Medication Sig Start Date End Date Taking? Authorizing Provider  albuterol (PROAIR HFA) 108 (90 Base) MCG/ACT inhaler Inhale 2 Inhalers into the lungs as needed for wheezing or shortness of breath.     [provider]  atorvastatin (LIPITOR) 80 MG tablet Take 80 mg by mouth at bedtime.  08/30/16   [provider]  busPIRone (BUSPAR) 5 MG  tablet Take 5 mg by mouth 2 (two) times daily.     [provider]  cholecalciferol (VITAMIN D3) 25 MCG (1000 UNIT) tablet Take 1,000 Units by mouth daily.    [provider]  ELDERBERRY PO Take 1 tablet by mouth daily.    [provider]  FLOVENT HFA 220 MCG/ACT inhaler Inhale 2 puffs into the lungs daily.  12/11/16   [provider]  hydrOXYzine (VISTARIL) 25 MG capsule Take 50 mg by mouth 2 (two) times daily.     [provider]  lansoprazole (PREVACID) 15 MG capsule Take 15 mg by mouth at bedtime.     [provider]  lisinopril (PRINIVIL,ZESTRIL) 5 MG tablet Take 5 mg by mouth daily. 12/26/17   [provider]  metFORMIN (GLUCOPHAGE-XR) 500 MG 24 hr tablet Take 1,000 mg by mouth 2 (two) times daily.  03/23/16   [provider]  [facility] VERIO test strip TEST ONCE D 09/21/18   Schuyler Amor, MD  sertraline (ZOLOFT) 100 MG tablet Take 100 mg by mouth daily. 12/03/17   [provider]    Allergies Ibuprofen, Levaquin [levofloxacin in d5w], Sulfamethoxazole-trimethoprim, and Nitroglycerin  Family Hx Family History  Problem Relation Age of Onset  . Stroke Father   . Ovarian cancer Maternal Grandmother   . Breast cancer Neg Hx     Social Hx Social History   Tobacco Use  . Smoking status: Former Smoker    Packs/day: 1.00    Years: 5.00    Pack years: 5.00    Types: Cigarettes    Quit date: 08/12/1991    Years since quitting: 28.2  . Smokeless tobacco: Never Used  Vaping Use  . Vaping Use: Never used  Substance Use Topics  . Alcohol use: No  . Drug use: No     Review of Systems  Constitutional: Negative for fever. Negative for chills. Eyes: Negative for visual changes. ENT: Negative for sore throat. Cardiovascular: Negative for chest pain. Respiratory: Negative for shortness of breath. Gastrointestinal: + abdominal pain, nausea Genitourinary: Negative for dysuria. Musculoskeletal: Negative for  leg swelling. Skin: Negative for rash. Neurological: Negative for headaches.   Physical Exam  Vital Signs: ED Triage Vitals  Enc Vitals Group     BP 11/03/19 1359 (!) 172/101     Pulse Rate 11/03/19 1359 88     Resp 11/03/19 1359 18     Temp 11/03/19 1359 98.4 F (36.9 C)     Temp Source 11/03/19 1359 Oral     SpO2 11/03/19 1359 95 %     Weight 11/03/19 1400 230 lb (104.3 kg)     Height 11/03/19 1400 5\' 5"  (1.651 m)     Head Circumference --      Peak Flow --      Pain Score 11/03/19 1400 9     Pain Loc --      Pain Edu? --      Excl. in [place]? --     Constitutional:  Alert and oriented. Well appearing. NAD.  Head: Normocephalic. Atraumatic. Eyes: Conjunctivae clear. Sclera anicteric. Pupils equal and symmetric. Nose: No masses or lesions. No congestion or rhinorrhea. Mouth/Throat: Wearing mask.  Neck: No stridor. Trachea midline.  Cardiovascular: Normal rate, regular rhythm. Extremities well perfused. Respiratory: Normal respiratory effort.  Lungs CTAB. Gastrointestinal: Soft. Non-distended. TTP R mid abdomen and RLQ. No rebound, guarding, rigidity. No rashes, lesions, vesicles, or evidence of shingles. No palpable hernias. Genitourinary: Deferred. Musculoskeletal: No lower extremity edema. No deformities. Neurologic:  Normal speech and language. No gross focal or lateralizing neurologic deficits are appreciated.  Skin: Skin is warm, dry and intact. No rash noted. Psychiatric: Mood and affect are appropriate for situation.     Radiology  Personally reviewed available imaging myself.   CT A/P - IMPRESSION:  1. No acute abnormalities of the abdomen or pelvis.  2. Stable hiatal hernia with evidence of previous gastric surgery.  3. Aortic atherosclerosis.    Procedures  Procedure(s) performed (including critical care):  Procedures   Initial Impression / Assessment and Plan / MDM / ED Course  64 y.o. female who presents to the ED for RLQ and R flank pain and  nausea since this morning  Ddx: nephrolithiasis, appendicitis, colitis, GI virus, MSK, UTI.  Patient is status post cholecystectomy, could consider retained stone.  No palpable hernias.  No evidence of shingles on exam.  Will plan for labs, imaging, pain control  Work-up unremarkable.  Labs without actionable derangements.  Normal WBC, normal hepatic function panel, normal lipase.  UA unremarkable.  CT without any acute abnormalities.  Updated patient on results.  Will plan for discharge with supportive care, Rx for Bentyl as needed.  Tolerated by mouth in the ER.  Patient comfortable with the plan and discharge.  Advised outpatient follow-up and given return precautions.   _______________________________   As part of my medical decision making I have reviewed available labs, radiology tests, reviewed old records/performed chart review.   Final Clinical Impression(s) / ED Diagnosis  Final diagnoses:  Right sided abdominal pain       Note:  This document was prepared using Dragon voice recognition software and may include unintentional dictation errors.   Lilia Pro., MD 11/03/19 [phone]

## 2019-11-03 NOTE — ED Notes (Signed)
E-signature not working at this time. Pt verbalized understanding of D/C instructions, prescriptions and follow up care with no further questions at this time. Pt in NAD and ambulatory at time of D/C.  

## 2019-11-04 ENCOUNTER — Encounter: Payer: Self-pay | Admitting: Oncology

## 2019-12-07 ENCOUNTER — Emergency Department: Payer: Managed Care, Other (non HMO)

## 2019-12-07 ENCOUNTER — Other Ambulatory Visit: Payer: Self-pay

## 2019-12-07 ENCOUNTER — Emergency Department
Admission: EM | Admit: 2019-12-07 | Discharge: 2019-12-07 | Disposition: A | Discharge status: Home / Self Care | Payer: Managed Care, Other (non HMO) | Attending: Emergency Medicine | Admitting: Emergency Medicine

## 2019-12-07 ENCOUNTER — Encounter: Payer: Self-pay | Admitting: Emergency Medicine

## 2019-12-07 DIAGNOSIS — E119 Type 2 diabetes mellitus without complications: Secondary | ICD-10-CM | POA: Diagnosis not present

## 2019-12-07 DIAGNOSIS — J449 Chronic obstructive pulmonary disease, unspecified: Secondary | ICD-10-CM | POA: Diagnosis not present

## 2019-12-07 DIAGNOSIS — Z7984 Long term (current) use of oral hypoglycemic drugs: Secondary | ICD-10-CM | POA: Diagnosis not present

## 2019-12-07 DIAGNOSIS — J40 Bronchitis, not specified as acute or chronic: Secondary | ICD-10-CM | POA: Diagnosis not present

## 2019-12-07 DIAGNOSIS — M1712 Unilateral primary osteoarthritis, left knee: Secondary | ICD-10-CM | POA: Diagnosis not present

## 2019-12-07 DIAGNOSIS — R0781 Pleurodynia: Secondary | ICD-10-CM | POA: Diagnosis not present

## 2019-12-07 DIAGNOSIS — Z79899 Other long term (current) drug therapy: Secondary | ICD-10-CM | POA: Insufficient documentation

## 2019-12-07 DIAGNOSIS — I10 Essential (primary) hypertension: Secondary | ICD-10-CM | POA: Diagnosis not present

## 2019-12-07 DIAGNOSIS — R0602 Shortness of breath: Secondary | ICD-10-CM

## 2019-12-07 DIAGNOSIS — I2510 Atherosclerotic heart disease of native coronary artery without angina pectoris: Secondary | ICD-10-CM | POA: Insufficient documentation

## 2019-12-07 DIAGNOSIS — M79605 Pain in left leg: Secondary | ICD-10-CM | POA: Diagnosis present

## 2019-12-07 DIAGNOSIS — Z87891 Personal history of nicotine dependence: Secondary | ICD-10-CM | POA: Diagnosis not present

## 2019-12-07 HISTORY — DX: Acute embolism and thrombosis of unspecified deep veins of unspecified lower extremity: I82.409

## 2019-12-07 LAB — TROPONIN I (HIGH SENSITIVITY)
Troponin I (High Sensitivity): 4 ng/L (ref <18)
Troponin I (High Sensitivity): 5 ng/L (ref <18)

## 2019-12-07 LAB — BASIC METABOLIC PANEL
Anion gap: 8 (ref 5–15)
BUN: 17 mg/dL (ref 8–23)
CO2: 26 mmol/L (ref 22–32)
Calcium: 8.9 mg/dL (ref 8.9–10.3)
Chloride: 105 mmol/L (ref 98–111)
Creatinine, Ser: 0.72 mg/dL (ref 0.44–1.00)
GFR calc Af Amer: >60 mL/min (ref >60)
GFR calc non Af Amer: >60 mL/min (ref >60)
Glucose, Bld: 250 mg/dL — ABNORMAL HIGH (ref 70–99)
Potassium: 3.8 mmol/L (ref 3.5–5.1)
Sodium: 139 mmol/L (ref 135–145)

## 2019-12-07 LAB — CBC
HCT: 40.7 % (ref 36.0–46.0)
Hemoglobin: 13.2 g/dL (ref 12.0–15.0)
MCH: 26.4 pg (ref 26.0–34.0)
MCHC: 32.4 g/dL (ref 30.0–36.0)
MCV: 81.4 fL (ref 80.0–100.0)
Platelets: 206 10*3/uL (ref 150–400)
RBC: 5 MIL/uL (ref 3.87–5.11)
RDW: 13.1 % (ref 11.5–15.5)
WBC: 7 10*3/uL (ref 4.0–10.5)
nRBC: 0 % (ref 0.0–0.2)

## 2019-12-07 MED ORDER — PREDNISONE 10 MG PO TABS
10.0000 mg | ORAL_TABLET | Freq: Every day | ORAL | 0 refills | Status: DC
Start: 2019-12-07 — End: 2021-01-18

## 2019-12-07 MED ORDER — IOHEXOL 350 MG/ML SOLN
75.0000 mL | Freq: Once | INTRAVENOUS | Status: AC | PRN
  Administered 2019-12-07: 75 mL via INTRAVENOUS

## 2019-12-07 MED ORDER — TRAMADOL HCL 50 MG PO TABS: 50.0000 mg | ORAL_TABLET | Freq: Four times a day (QID) | ORAL | 0 refills | Status: DC | PRN

## 2019-12-07 NOTE — ED Triage Notes (Signed)
Pain and swelling started Wednesday in left leg.  Hx of DVT, no longer on blood thinners. Pain behind knee.  Started with [facility] last night.  Also has hx of PE. Unlabored at this time.

## 2019-12-07 NOTE — ED Provider Notes (Signed)
[facility] Emergency Department Provider Note  ____________________________________________  Time seen: Approximately 5:45 PM  I have reviewed the triage vital signs and the nursing notes.   HISTORY  Chief Complaint Leg Pain and Shortness of Breath    HPI Andrea Pollard is a 64 y.o. female who presents the emergency department complaining of left leg pain, pleuritic pain, cough and shortness of breath.  Patient presents the emergency department primarily concerned for DVT or PE.  Patient states that she has known osteoarthritis of the left knee, recently had a cortisone injection for same.  Patient states that initially the pain seemed to improve but it is worsened.  She is concerned as she has a history of DVT though she is not anticoagulated.  Patient states that the pain has persisted with over-the-counter medication use and the cortisone injection in presents for evaluation.  Patient is also complaining of some mild pleuritic pain and shortness of breath.  She has a very mild nonproductive cough that is very intermittent in nature.  No fevers or chills.  No URI symptoms or nasal congestion or sore throat.  Patient does have a history of COPD,  diabetes, DVT, PE, lung nodule being followed by oncology, sleep apnea.         Past Medical History:  Diagnosis Date  . Anxiety   . Asthma   . Complication of anesthesia   . COPD (chronic obstructive pulmonary disease) (HCC)    diagnosed from pulmonary function tests  . Depression   . Diabetes mellitus without complication ([place])   . DVT (deep venous thrombosis) ([place])   . Dvt femoral (deep venous thrombosis) ([place]) 2018   in lung also.  treated with xarelto for 6 months  . GERD (gastroesophageal reflux disease)   . Hepatitis    AGE 62-HEPATITIS B  . History of hiatal hernia   . History of kidney stones 2019  . History of methicillin resistant staphylococcus aureus (MRSA) 2013   in neck due to ingrown hair.  had surgery to drain cyst  . History of palpitations 2019   due to a panic attack  . Hypertension   . Lung nodule 2019   being followed by dr. Grayland Ormond, dr Genevive Bi and dr. Mortimer Fries  . Pneumonia 04/2016  . PONV (postoperative nausea and vomiting)    NAUSEATED  . Pulmonary emboli ([place]) 2018  . Sleep apnea    CPAP  . Vertigo     Patient Active Problem List   Diagnosis Date Noted  . Pulmonary embolism ([place]) 03/04/2017  . Benign hypertension 10/05/2016  . Constipation 10/05/2016  . Gastroesophageal reflux disease 10/05/2016  . Hypercholesterolemia 10/05/2016  . Indigestion 10/05/2016  . Obstructive sleep apnea of adult 10/05/2016  . Type 2 diabetes mellitus ([place]) 10/05/2016  . Vitamin D deficiency 10/05/2016  . Coronary artery disease of native artery of native heart with stable angina pectoris ([place]) 09/27/2016  . Aortic atherosclerosis ([place]) 09/27/2016  . History of smoking 09/27/2016  . Lung nodule 06/07/2016  . Morbid obesity ([place]) 04/23/2014    Past Surgical History:  Procedure Laterality Date  . ABDOMINAL HYSTERECTOMY  1994  . CESAREAN SECTION    . CHOLECYSTECTOMY  2013  . CYST REMOVAL NECK  2013   was ingrown hair . positive for mrsa at that time prior to removal in OR  . CYSTOSCOPY WITH URETEROSCOPY AND STENT PLACEMENT Right 05/31/2018   Procedure: CYSTOSCOPY WITH URETEROSCOPY, LASER LITHOTRIPSY AND STENT PLACEMENT;  Surgeon: Billey Co, MD;  Location: [facility] ORS;  Service: Urology;  Laterality: Right;  . ELECTROMAGNETIC NAVIGATION BROCHOSCOPY N/A 08/15/2016   Procedure: ELECTROMAGNETIC NAVIGATION BRONCHOSCOPY;  Surgeon: Flora Lipps, MD;  Location: [facility] ORS;  Service: Cardiopulmonary;  Laterality: N/A;  . ELECTROMAGNETIC NAVIGATION BROCHOSCOPY Right 11/07/2017   Procedure: ELECTROMAGNETIC NAVIGATION BRONCHOSCOPY;  Surgeon: Flora Lipps, MD;  Location: [facility] ORS;  Service: Cardiopulmonary;  Laterality: Right;  . LAPAROSCOPIC GASTRIC SLEEVE RESECTION  2015   lost 75 pounds     Prior to Admission medications   Medication Sig Start Date End Date Taking? Authorizing Provider  albuterol (PROAIR HFA) 108 (90 Base) MCG/ACT inhaler Inhale 2 Inhalers into the lungs as needed for wheezing or shortness of breath.     [provider]  atorvastatin (LIPITOR) 80 MG tablet Take 80 mg by mouth at bedtime.  08/30/16   [provider]  busPIRone (BUSPAR) 5 MG tablet Take 5 mg by mouth 2 (two) times daily.     [provider]  cholecalciferol (VITAMIN D3) 25 MCG (1000 UNIT) tablet Take 1,000 Units by mouth daily.    [provider]  dicyclomine (BENTYL) 20 MG tablet Take 1 tablet (20 mg total) by mouth every 6 (six) hours as needed for up to 7 days for spasms. 11/03/19 11/10/19  Lilia Pro., MD  ELDERBERRY PO Take 1 tablet by mouth daily.    [provider]  FLOVENT HFA 220 MCG/ACT inhaler Inhale 2 puffs into the lungs daily.  12/11/16   [provider]  hydrOXYzine (VISTARIL) 25 MG capsule Take 50 mg by mouth 2 (two) times daily.     [provider]  lansoprazole (PREVACID) 15 MG capsule Take 15 mg by mouth at bedtime.     [provider]  lisinopril (PRINIVIL,ZESTRIL) 5 MG tablet Take 5 mg by mouth daily. 12/26/17   [provider]  metFORMIN (GLUCOPHAGE-XR) 500 MG 24 hr tablet Take 1,000 mg by mouth 2 (two) times daily.  03/23/16   [provider]  [facility] VERIO test strip TEST ONCE D 09/21/18   Schuyler Amor, MD  predniSONE (DELTASONE) 10 MG tablet Take 1 tablet (10 mg total) by mouth daily. 12/07/19   Vyom Brass, Charline Bills, PA-C  sertraline (ZOLOFT) 100 MG tablet Take 100 mg by mouth daily. 12/03/17   [provider]  traMADol (ULTRAM) 50 MG tablet Take 1 tablet (50 mg total) by mouth every 6 (six) hours as needed. 12/07/19   Ozias Dicenzo, Charline Bills, PA-C    Allergies Ibuprofen, Levaquin [levofloxacin in d5w], Sulfamethoxazole-trimethoprim, and Nitroglycerin  Family History   Problem Relation Age of Onset  . Stroke Father   . Ovarian cancer Maternal Grandmother   . Breast cancer Neg Hx     Social History Social History   Tobacco Use  . Smoking status: Former Smoker    Packs/day: 1.00    Years: 5.00    Pack years: 5.00    Types: Cigarettes    Quit date: 08/12/1991    Years since quitting: 28.3  . Smokeless tobacco: Never Used  Vaping Use  . Vaping Use: Never used  Substance Use Topics  . Alcohol use: No  . Drug use: No     Review of Systems  Constitutional: No fever/chills Eyes: No visual changes. No discharge ENT: No upper respiratory complaints. Cardiovascular: no chest pain. Respiratory: no cough.  Mild SOB with pleuritic pain. Gastrointestinal: No abdominal pain.  No nausea, no vomiting.  No diarrhea.  No constipation. Genitourinary: Negative for dysuria.  No hematuria Musculoskeletal: Negative for musculoskeletal pain. Skin: Negative for rash, abrasions, lacerations, ecchymosis. Neurological: Negative for headaches, focal weakness or numbness. 10-point ROS otherwise negative.  ____________________________________________   PHYSICAL EXAM:  VITAL SIGNS: ED Triage Vitals  Enc Vitals Group     BP 12/07/19 1133 (!) 170/85     Pulse Rate 12/07/19 1133 73     Resp 12/07/19 1133 16     Temp 12/07/19 1133 98.7 F (37.1 C)     Temp Source 12/07/19 1133 Oral     SpO2 12/07/19 1133 97 %     Weight 12/07/19 1134 230 lb (104.3 kg)     Height 12/07/19 1134 5\' 5"  (1.651 m)     Head Circumference --      Peak Flow --      Pain Score 12/07/19 1134 8     Pain Loc --      Pain Edu? --      Excl. in [place]? --      Constitutional: Alert and oriented. Well appearing and in no acute distress. Eyes: Conjunctivae are normal. PERRL. EOMI. Head: Atraumatic. ENT:      Ears:       Nose: No congestion/rhinnorhea.      Mouth/Throat: Mucous membranes are moist.  Neck: No stridor.  No cervical spine tenderness to palpation.  Cardiovascular: Normal  rate, regular rhythm. Normal S1 and S2.  Good peripheral circulation. Respiratory: Normal respiratory effort without tachypnea or retractions. Lungs CTAB. Good air entry to the bases with no decreased or absent breath sounds. Musculoskeletal: Full range of motion to all extremities. No gross deformities appreciated.  No visible edema, erythema to the left knee or left lower extremity.  Patient was tender to palpation in the popliteal fossa on the left knee.  No palpable abnormality.  Dorsalis pedis pulses sensation intact distally.  No calf tenderness.  No erythema or edema of the calf. Neurologic:  Normal speech and language. No gross focal neurologic deficits are appreciated.  Skin:  Skin is warm, dry and intact. No rash noted. Psychiatric: Mood and affect are normal. Speech and behavior are normal. Patient exhibits appropriate insight and judgement.   ____________________________________________   LABS (all labs ordered are listed, but only abnormal results are displayed)  Labs Reviewed  BASIC METABOLIC PANEL - Abnormal; Notable for the following components:      Result Value   Glucose, Bld 250 (*)    All other components within normal limits  CBC  TROPONIN I (HIGH SENSITIVITY)  TROPONIN I (HIGH SENSITIVITY)   ____________________________________________  EKG  ED ECG REPORT I, Charline Bills Markelle Asaro,  personally viewed and interpreted this ECG.   Date: 12/07/2019  EKG Time: 1137 hrs.  Rate: 74 bpm  Rhythm: unchanged from previous tracings, normal sinus rhythm  Axis: Leftward axis  Intervals:none  ST&T Change: No ST elevation or depression noted  Normal sinus rhythm.  No STEMI.  No significant changes from previous EKG.  ____________________________________________  RADIOLOGY I personally viewed and evaluated these images as part of my medical decision making, as well as reviewing the written report by the radiologist.  DG Chest 2 View  Result Date: 12/07/2019 CLINICAL  DATA:  Shortness of breath. EXAM: CHEST - 2 VIEW COMPARISON:  November 26, 2017. FINDINGS: The heart size and mediastinal contours are within normal limits. No pneumothorax or pleural effusion is noted. Probable small amount of fluid is noted in right minor fissure. Lungs are otherwise clear. The visualized skeletal structures are unremarkable.  IMPRESSION: No active cardiopulmonary disease. Aortic Atherosclerosis (ICD10-I70.0). Electronically Signed   By: Marijo Conception M.D.   On: 12/07/2019 12:51   CT Angio Chest PE W and/or Wo Contrast  Result Date: 12/07/2019 CLINICAL DATA:  Shortness of breath and pleuritic chest pain in a patient with a history of DVT. EXAM: CT ANGIOGRAPHY CHEST WITH CONTRAST TECHNIQUE: Multidetector CT imaging of the chest was performed using the standard protocol during bolus administration of intravenous contrast. Multiplanar CT image reconstructions and MIPs were obtained to evaluate the vascular anatomy. CONTRAST:  75 ML OMNIPAQUE IOHEXOL 350 MG/ML SOLN COMPARISON:  CT chest 08/25/2019 and 07/06/2016. FINDINGS: Cardiovascular: Satisfactory opacification of the pulmonary arteries to the segmental level. No evidence of pulmonary embolism. Normal heart size. No pericardial effusion. Calcific aortic and coronary atherosclerosis noted. Mediastinum/Nodes: No lymphadenopathy. Small hiatal hernia. Low attenuating lesion the right lobe of the thyroid with coarse calcifications is stable since 2018. Not clinically significant; no follow-up imaging recommended (ref: J Am Coll Radiol. 2015 Feb;12(2): 143-50). Lungs/Pleura: Scattered bilateral pulmonary nodules again seen. Largest nodule measures 1.5 x 1.0 cm on image 60 and is in the inferior right upper lobe. The appearance is unchanged. Upper Abdomen: The liver is low attenuating compatible with fatty infiltration. The patient is status post cholecystectomy. Suture along the greater curvature of the stomach is most consistent with prior bariatric  surgery. Musculoskeletal: No acute or focal abnormality. Review of the MIP images confirms the above findings. IMPRESSION: Negative for pulmonary embolus or acute disease. Multiple pulmonary nodules are unchanged since the most recent examination. Please see follow-up recommendations on report of the study. Fatty infiltration liver. Aortic Atherosclerosis (ICD10-I70.0). Calcific coronary artery disease also noted. Electronically Signed   By: Inge Rise M.D.   On: 12/07/2019 17:33   US Venous Img Lower Unilateral Left  Result Date: 12/07/2019 CLINICAL DATA:  Left leg swelling. History of DVT. EXAM: LEFT LOWER EXTREMITY VENOUS DOPPLER ULTRASOUND TECHNIQUE: Gray-scale sonography with compression, as well as color and duplex ultrasound, were performed to evaluate the deep venous system(s) from the level of the common femoral vein through the popliteal and proximal calf veins. COMPARISON:  None. FINDINGS: VENOUS Normal compressibility of the common femoral, superficial femoral, and popliteal veins, as well as the visualized calf veins. Visualized portions of profunda femoral vein and great saphenous vein unremarkable. No filling defects to suggest DVT on grayscale or color Doppler imaging. Doppler waveforms show normal direction of venous flow, normal respiratory plasticity and response to augmentation. Limited views of the contralateral common femoral vein are unremarkable. OTHER None. Limitations: none IMPRESSION: Negative. Electronically Signed   By: Fidela Salisbury M.D.   On: 12/07/2019 12:47    ____________________________________________    PROCEDURES  Procedure(s) performed:    Procedures    Medications  iohexol (OMNIPAQUE) 350 MG/ML injection 75 mL (75 mLs Intravenous Contrast Given 12/07/19 1705)     ____________________________________________   INITIAL IMPRESSION / ASSESSMENT AND PLAN / ED COURSE  Pertinent labs & imaging results that were available during my care of the  patient were reviewed by me and considered in my medical decision making (see chart for details).  Review of the [place] CSRS was performed in accordance of the [place] prior to dispensing any controlled drugs.           Patient's diagnosis is consistent with osteoarthritis of the knee, bronchitis.  Patient presented to emergency department knee pain, some shortness of breath and cough.  Patient does have a history of  DVT, PE.  Exam is reassuring at this time.  Given the history patient was evaluated with labs, imaging.  Ultrasound reveals no evidence of DVT.  CT angio of the chest reveals no evidence of PE.  I feel that the symptoms are likely secondary to the patient's osteoarthritis.  She had a recent cortisone injection which improved symptoms for a day or 2, but then pain worsened.  As this occurred 4 days ago I suspect that steroid has not had enough time to provide symptom relief.  Patient also had complaints of cough, mild shortness of breath.  She has a history of repeat bronchitis as well as pneumonia.  She has lung nodules that are been followed for 3 years with no change.  With a history of PE angio chest was ordered.  This was negative for PE.  At this time I feel that patient likely has bronchitis given her symptoms.  Patient will be treated with a course of steroid for both her bronchitis and osteoarthritis.  Limited prescription for Ultram for knee pain.  Follow-up with orthopedics for her knee as needed.  Patient is being currently followed by oncology for lung nodules and she should follow-up with same for ongoing evaluation.  Follow-up primary care for bronchitis if symptoms persist or worsen..  Patient is given ED precautions to return to the ED for any worsening or new symptoms.     ____________________________________________  FINAL CLINICAL IMPRESSION(S) / ED DIAGNOSES  Final diagnoses:  Osteoarthritis of left knee, unspecified osteoarthritis type  Bronchitis  Shortness of breath       NEW MEDICATIONS STARTED DURING THIS VISIT:  ED Discharge Orders         Ordered    predniSONE (DELTASONE) 10 MG tablet  Daily     Discontinue  Reprint    Note to Pharmacy: Take 6 pills x 2 days, 5 pills x 2 days, 4 pills x 2 days, 3 pills x 2 days, 2 pills x 2 days, and 1 pill x 2 days   12/07/19 1903    traMADol (ULTRAM) 50 MG tablet  Every 6 hours PRN     Discontinue  Reprint     12/07/19 1903              This chart was dictated using voice recognition software/Dragon. Despite best efforts to proofread, errors can occur which can change the meaning. Any change was purely unintentional.    Darletta Moll, PA-C 12/07/19 1958    Duffy Bruce, MD 12/07/19 2001

## 2019-12-07 NOTE — ED Notes (Signed)
IV team at bedside 

## 2020-01-10 ENCOUNTER — Ambulatory Visit: Payer: Self-pay

## 2020-02-19 NOTE — Progress Notes (Signed)
Andrea Pollard OB: Dec 14, 1955  MR#: [id number]  INO#:[id number]  Patient Care Team: Ellamae Sia, MD as PCP - General (Internal Medicine) Minna Merritts, MD as Consulting Physician (Cardiology) Telford Nab, RN as Registered Nurse  CHIEF COMPLAINT: Mass of upper lobe of right lung.  INTERVAL HISTORY: Patient returns to clinic patient today for further evaluation and discussion of her imaging results.  She had an MRI of her back yesterday at [facility], but these results are not available at this time.  She currently feels well and is at her baseline.  She has no neurologic complaints. She denies any recent fevers or illnesses. She has a good appetite and denies weight loss.  She denies any chest pain, shortness of breath, cough, or hemoptysis.  She denies any nausea, vomiting, constipation, or diarrhea. She has no urinary complaints.  Patient offers no specific complaints today.  REVIEW OF SYSTEMS:   Review of Systems  Constitutional: Negative.  Negative for fever, malaise/fatigue and weight loss.  Respiratory: Negative.  Negative for cough, hemoptysis and shortness of breath.   Cardiovascular: Negative.  Negative for chest pain and leg swelling.  Gastrointestinal: Negative.  Negative for abdominal pain.  Genitourinary: Negative.  Negative for dysuria.  Musculoskeletal: Positive for back pain. Negative for neck pain.  Skin: Negative.  Negative for rash.  Neurological: Negative.  Negative for sensory change, focal weakness and weakness.  Psychiatric/Behavioral: Negative.  The patient is not nervous/anxious and does not have insomnia.     As per HPI. Otherwise, a complete review of systems is negative.  PAST MEDICAL HISTORY: Past Medical History:  Diagnosis Date  . Anxiety   . Asthma   . Complication of anesthesia   . COPD (chronic obstructive pulmonary disease) (HCC)    diagnosed from  pulmonary function tests  . Depression   . Diabetes mellitus without complication ([place])   . DVT (deep venous thrombosis) ([place])   . Dvt femoral (deep venous thrombosis) ([place]) 2018   in lung also.  treated with xarelto for 6 months  . GERD (gastroesophageal reflux disease)   . 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 History of hiatal hernia   . History of kidney stones 2019  . History of methicillin resistant staphylococcus aureus (MRSA) 2013   in neck due to ingrown hair. had surgery to drain cyst  . History of palpitations 2019   due to a panic attack  . Hypertension   . Lung nodule 2019   being followed by dr. Grayland Ormond, dr Genevive Bi and dr. Mortimer Fries  . Pneumonia 04/2016  . PONV (postoperative nausea and vomiting)    NAUSEATED  . Pulmonary emboli ([place]) 2018  . Sleep apnea    CPAP  . Vertigo     PAST SURGICAL HISTORY: Past Surgical History:  Procedure Laterality Date  . ABDOMINAL HYSTERECTOMY  1994  . CESAREAN SECTION    . CHOLECYSTECTOMY  2013  . CYST REMOVAL NECK  2013   was ingrown hair . positive for mrsa at that time prior to removal in OR  . CYSTOSCOPY WITH URETEROSCOPY AND STENT PLACEMENT Right 05/31/2018   Procedure: CYSTOSCOPY WITH URETEROSCOPY, LASER LITHOTRIPSY AND STENT PLACEMENT;  Surgeon: Billey Co, MD;  Location: [facility] ORS;  Service: Urology;  Laterality: Right;  . ELECTROMAGNETIC NAVIGATION BROCHOSCOPY N/A 08/15/2016   Procedure: ELECTROMAGNETIC NAVIGATION BRONCHOSCOPY;  Surgeon: Flora Lipps, MD;  Location: [facility] ORS;  Service: Cardiopulmonary;  Laterality: N/A;  .  ELECTROMAGNETIC NAVIGATION BROCHOSCOPY Right 11/07/2017   Procedure: ELECTROMAGNETIC NAVIGATION BRONCHOSCOPY;  Surgeon: Flora Lipps, MD;  Location: [facility] ORS;  Service: Cardiopulmonary;  Laterality: Right;  . LAPAROSCOPIC GASTRIC SLEEVE RESECTION  2015   lost 75 pounds    FAMILY HISTORY: Family History  Problem Relation Age of Onset  . Stroke Father   . Ovarian cancer Maternal Grandmother   . Breast  cancer Neg Hx     ADVANCED DIRECTIVES (Y/N):  N  HEALTH MAINTENANCE: Social History   Tobacco Use  . Smoking status: Former Smoker    Packs/day: 1.00    Years: 5.00    Pack years: 5.00    Types: Cigarettes    Quit date: 08/12/1991    Years since quitting: 28.5  . Smokeless tobacco: Never Used  Vaping Use  . Vaping Use: Never used  Substance Use Topics  . Alcohol use: No  . Drug use: No     Colonoscopy:  PAP:  Bone density:  Lipid panel:  Allergies  Allergen Reactions  . Ibuprofen Other (See Comments)    Cannot Use due to Gastric SLEEVE SURGERY  . Levaquin [Levofloxacin In D5w] Hives  . Sulfamethoxazole-Trimethoprim Hives  . Nitroglycerin Other (See Comments)    Severe Hypotension    Current Outpatient Medications  Medication Sig Dispense Refill  . albuterol (PROAIR HFA) 108 (90 Base) MCG/ACT inhaler Inhale 2 Inhalers into the lungs as needed for wheezing or shortness of breath.     Marland Kitchen atorvastatin (LIPITOR) 80 MG tablet Take 80 mg by mouth at bedtime.   2  . busPIRone (BUSPAR) 5 MG tablet Take 5 mg by mouth 2 (two) times daily.     . cholecalciferol (VITAMIN D3) 25 MCG (1000 UNIT) tablet Take 1,000 Units by mouth daily.    Marland Kitchen dicyclomine (BENTYL) 20 MG tablet Take 1 tablet (20 mg total) by mouth every 6 (six) hours as needed for up to 7 days for spasms. 28 tablet 0  . ELDERBERRY PO Take 1 tablet by mouth daily.    Marland Kitchen FLOVENT HFA 220 MCG/ACT inhaler Inhale 2 puffs into the lungs daily.   5  . hydrOXYzine (VISTARIL) 25 MG capsule Take 50 mg by mouth 2 (two) times daily.     . lansoprazole (PREVACID) 15 MG capsule Take 15 mg by mouth at bedtime.     Marland Kitchen lisinopril (PRINIVIL,ZESTRIL) 5 MG tablet Take 5 mg by mouth daily.  3  . metFORMIN (GLUCOPHAGE-XR) 500 MG 24 hr tablet Take 1,000 mg by mouth 2 (two) times daily.   2  . ONETOUCH VERIO test strip TEST ONCE D 100 each 3  . predniSONE (DELTASONE) 10 MG tablet Take 1 tablet (10 mg total) by mouth daily. 42 tablet 0  .  sertraline (ZOLOFT) 100 MG tablet Take 100 mg by mouth daily.  3  . traMADol (ULTRAM) 50 MG tablet Take 1 tablet (50 mg total) by mouth every 6 (six) hours as needed. 12 tablet 0   No current facility-administered medications for this visit.    OBJECTIVE: Vitals:   02/26/20 1423  BP: 107/70  Pulse: 75  Resp: 20  Temp: (!) 97.3 F (36.3 C)  SpO2: 98%     Body mass index is 37.94 kg/m.    ECOG FS:0 - Asymptomatic  General: Well-developed, well-nourished, no acute distress. Eyes: Pink conjunctiva, anicteric sclera. HEENT: Normocephalic, moist mucous membranes. Lungs: No audible wheezing or coughing. Heart: Regular rate and rhythm. Abdomen: Soft, nontender, no obvious distention. Musculoskeletal: No edema,  cyanosis, or clubbing. Neuro: Alert, answering all questions appropriately. Cranial nerves grossly intact. Skin: No rashes or petechiae noted. Psych: Normal affect.  LAB RESULTS:  Lab Results  Component Value Date   NA 139 12/07/2019   K 3.8 12/07/2019   CL 105 12/07/2019   CO2 26 12/07/2019   GLUCOSE 250 (H) 12/07/2019   BUN 17 12/07/2019   CREATININE 0.70 02/26/2020   CALCIUM 8.9 12/07/2019   PROT 7.4 11/03/2019   ALBUMIN 3.9 11/03/2019   AST 29 11/03/2019   ALT 25 11/03/2019   ALKPHOS 78 11/03/2019   BILITOT 0.5 11/03/2019   GFRNONAA >60 12/07/2019   GFRAA >60 12/07/2019    Lab Results  Component Value Date   WBC 7.0 12/07/2019   NEUTROABS 4.1 09/21/2018   HGB 13.2 12/07/2019   HCT 40.7 12/07/2019   MCV 81.4 12/07/2019   PLT 206 12/07/2019     STUDIES: CT Chest W Contrast  Result Date: 02/26/2020 CLINICAL DATA:  Follow-up right lung mass EXAM: CT CHEST WITH CONTRAST TECHNIQUE: Multidetector CT imaging of the chest was performed during intravenous contrast administration. CONTRAST:  52mL OMNIPAQUE IOHEXOL 300 MG/ML  SOLN COMPARISON:  12/07/2019, 08/25/2019, 02/03/2019, 07/22/2018, 02/12/2018, 04/09/2017 FINDINGS: Cardiovascular: Aortic atherosclerosis.  Normal heart size. Three-vessel coronary artery calcifications. No pericardial effusion. Mediastinum/Nodes: No enlarged mediastinal, hilar, or axillary lymph nodes. Thyroid gland, trachea, and esophagus demonstrate no significant findings. Lungs/Pleura: No significant interval change in a spiculated nodule of the medial right upper lobe abutting a segmental bronchus and measuring approximately 1.4 x 1.0 cm (series 4, image 60). Adjacent spiculated nodule of the medial segment right middle lobe is unchanged measuring 7 mm (series 4, image 74). There are numerous additional small solid and ground-glass pulmonary nodules scattered throughout the lungs, measuring no greater than 5 mm, for example in the right lower lobe (series 4, image 83). No pleural effusion or pneumothorax. Upper Abdomen: No acute abnormality. Postoperative findings of partial sleeve gastrectomy in the included upper abdomen. Hepatic steatosis. Musculoskeletal: No chest wall mass or suspicious bone lesions identified. IMPRESSION: 1. No significant interval change in a spiculated nodule of the medial right upper lobe abutting a segmental bronchus and measuring approximately 1.4 x 1.0 cm. 2. Adjacent spiculated nodule of the medial segment right middle lobe is unchanged measuring 7 mm. There are numerous additional small solid and ground-glass pulmonary nodules scattered throughout the lungs, measuring no greater than 5 mm. 3. Indolent behavior for greater than 3 years is very reassuring, but morphologically the dominant nodule remains concerning for malignancy. Background of small nodules are however definitively benign and sequelae of prior infection or inflammation. 4. Coronary artery disease. Aortic Atherosclerosis (ICD10-I70.0). 5. Hepatic steatosis. Electronically Signed   By: Eddie Candle M.D.   On: 02/26/2020 09:55    ASSESSMENT: Mass of upper lobe of right lung  PLAN:    1. Mass of upper lobe of right lung: Patient underwent biopsy on  November 07, 2017 that was negative for malignancy.  Despite the negative biopsy, there was still concern for malignancy.  CT scan results from February 26, 2020 reviewed independently and report as above with persistent yet unchanged 1.4 cm nodule in the right upper lobe.  Patient also has multiple other pulmonary nodules that are essentially unchanged.  No intervention is needed at this time. Because of patient's increased anxiety, she continues to request imaging and clinic visits be on the same day.  Return to clinic in 6 months with repeat imaging and further evaluation.  2. Bilateral PE/left leg DVT: Resolved.  Patient has now completed anticoagulation.  Diagnosed by CT scan on November 28, 2016. Patient has no obvious transient risk factors. Hypercoagulable workup was negative except for an elevated DRVVT which can be attributed to the anticoagulation she is taking at that time. 3.  Back pain: Continue follow-up with orthopedics as scheduled.  I spent a total of 20 minutes reviewing chart data, face-to-face evaluation with the patient, counseling and coordination of care as detailed above.   Patient expressed understanding and was in agreement with this plan. She also understands that She can call clinic at any time with any questions, concerns, or complaints.   Cancer Staging No matching staging information was found for the patient.  Lloyd Huger, MD   02/26/2020 3:18 PM

## 2020-02-26 ENCOUNTER — Inpatient Hospital Stay: Payer: Managed Care, Other (non HMO) | Attending: Oncology | Admitting: Oncology

## 2020-02-26 ENCOUNTER — Other Ambulatory Visit: Payer: Self-pay

## 2020-02-26 ENCOUNTER — Ambulatory Visit
Admission: RE | Admit: 2020-02-26 | Discharge: 2020-02-26 | Disposition: A | Discharge status: Home / Self Care | Payer: Managed Care, Other (non HMO) | Source: Ambulatory Visit | Attending: Oncology | Admitting: Oncology

## 2020-02-26 ENCOUNTER — Encounter: Payer: Self-pay | Admitting: Oncology

## 2020-02-26 VITALS — BP 107/70 | HR 75 | Temp 97.3°F | Resp 20 | Wt 228.0 lb

## 2020-02-26 DIAGNOSIS — Z7952 Long term (current) use of systemic steroids: Secondary | ICD-10-CM | POA: Diagnosis not present

## 2020-02-26 DIAGNOSIS — Z87891 Personal history of nicotine dependence: Secondary | ICD-10-CM | POA: Insufficient documentation

## 2020-02-26 DIAGNOSIS — Z86718 Personal history of other venous thrombosis and embolism: Secondary | ICD-10-CM | POA: Insufficient documentation

## 2020-02-26 DIAGNOSIS — Z8041 Family history of malignant neoplasm of ovary: Secondary | ICD-10-CM | POA: Insufficient documentation

## 2020-02-26 DIAGNOSIS — I2510 Atherosclerotic heart disease of native coronary artery without angina pectoris: Secondary | ICD-10-CM | POA: Insufficient documentation

## 2020-02-26 DIAGNOSIS — F329 Major depressive disorder, single episode, unspecified: Secondary | ICD-10-CM | POA: Diagnosis not present

## 2020-02-26 DIAGNOSIS — Z79899 Other long term (current) drug therapy: Secondary | ICD-10-CM | POA: Insufficient documentation

## 2020-02-26 DIAGNOSIS — J449 Chronic obstructive pulmonary disease, unspecified: Secondary | ICD-10-CM | POA: Insufficient documentation

## 2020-02-26 DIAGNOSIS — K219 Gastro-esophageal reflux disease without esophagitis: Secondary | ICD-10-CM | POA: Insufficient documentation

## 2020-02-26 DIAGNOSIS — I10 Essential (primary) hypertension: Secondary | ICD-10-CM | POA: Diagnosis not present

## 2020-02-26 DIAGNOSIS — F419 Anxiety disorder, unspecified: Secondary | ICD-10-CM | POA: Insufficient documentation

## 2020-02-26 DIAGNOSIS — Z803 Family history of malignant neoplasm of breast: Secondary | ICD-10-CM | POA: Insufficient documentation

## 2020-02-26 DIAGNOSIS — Z87442 Personal history of urinary calculi: Secondary | ICD-10-CM | POA: Insufficient documentation

## 2020-02-26 DIAGNOSIS — R918 Other nonspecific abnormal finding of lung field: Secondary | ICD-10-CM | POA: Diagnosis not present

## 2020-02-26 DIAGNOSIS — Z90710 Acquired absence of both cervix and uterus: Secondary | ICD-10-CM | POA: Diagnosis not present

## 2020-02-26 DIAGNOSIS — R911 Solitary pulmonary nodule: Secondary | ICD-10-CM | POA: Diagnosis not present

## 2020-02-26 DIAGNOSIS — G4730 Sleep apnea, unspecified: Secondary | ICD-10-CM | POA: Diagnosis not present

## 2020-02-26 DIAGNOSIS — E119 Type 2 diabetes mellitus without complications: Secondary | ICD-10-CM | POA: Insufficient documentation

## 2020-02-26 DIAGNOSIS — Z7951 Long term (current) use of inhaled steroids: Secondary | ICD-10-CM | POA: Diagnosis not present

## 2020-02-26 DIAGNOSIS — M549 Dorsalgia, unspecified: Secondary | ICD-10-CM | POA: Diagnosis not present

## 2020-02-26 DIAGNOSIS — Z7984 Long term (current) use of oral hypoglycemic drugs: Secondary | ICD-10-CM | POA: Insufficient documentation

## 2020-02-26 DIAGNOSIS — Z86711 Personal history of pulmonary embolism: Secondary | ICD-10-CM | POA: Insufficient documentation

## 2020-02-26 LAB — POCT I-STAT CREATININE: Creatinine, Ser: 0.7 mg/dL (ref 0.44–1.00)

## 2020-02-26 MED ORDER — IOHEXOL 300 MG/ML  SOLN
75.0000 mL | Freq: Once | INTRAMUSCULAR | Status: AC | PRN
  Administered 2020-02-26: 75 mL via INTRAVENOUS

## 2020-02-26 NOTE — Progress Notes (Signed)
Patient states today at 6 month follow up she is having some back pain and rates pain at 6. She also informed me she had MRI done yesterday on her back at [facility] and would like to know if provider can look at results. She states her only other concern is a spot her daughter found on her back several weeks ago. She would like to know if provider could examine.

## 2020-04-09 ENCOUNTER — Telehealth: Payer: Self-pay | Admitting: *Deleted

## 2020-04-09 NOTE — Telephone Encounter (Signed)
Spoke with pt and informed her that imaging is similar when compared to her last scan from 02/26/2020. Pt stated she is more concerned about the thyroid nodule. Pt made aware that will need thyroid ultrasound for further follow up and can be done at [facility] and at [facility]. Instructed pt to call back if thyroid ultrasound not performed during admission. Pt verbalized understanding.

## 2020-04-09 NOTE — Telephone Encounter (Signed)
Thank you :)

## 2020-04-09 NOTE — Telephone Encounter (Signed)
No problem, I will give her a call. Below is the impression from your scan at [facility]:  IMPRESSION   -- No pulmonary embolism.   -- Solid/spiculated nodule within the right perihilar upper lobe coupled with multifocal scattered groundglass opacities. This could represent infection. Recommend follow-up chest CT in 2-3 months to ensure resolution.   -- Partially visualized splenomegaly.   -- Ill-defined right thyroid nodule with microcalcifications. Recommend dedicated thyroid ultrasound for further evaluation when clinically able.

## 2020-04-09 NOTE — Telephone Encounter (Signed)
Patient called reporting that she is in the hospital at [facility] and they have found a nodule. She is requesting a return call from [facility] or Dr Grayland Ormond to discuss this further [phone]

## 2020-04-09 NOTE — Telephone Encounter (Signed)
Hayley-can you call her back?  I bet it's not new and they need to compared it to her last scan here.

## 2020-04-12 ENCOUNTER — Telehealth: Payer: Self-pay | Admitting: *Deleted

## 2020-04-12 DIAGNOSIS — E041 Nontoxic single thyroid nodule: Secondary | ICD-10-CM

## 2020-04-12 NOTE — Telephone Encounter (Signed)
Yes that's fine. Thanks  

## 2020-04-12 NOTE — Telephone Encounter (Signed)
Dr. Grayland Ormond- is it okay to order/schedule pt for thyroid US to follow up thyroid nodule seen on imaging at [facility]?

## 2020-04-12 NOTE — Telephone Encounter (Signed)
Patient called requesting to go ahead and be scheduled for the Thyroid US

## 2020-04-21 ENCOUNTER — Ambulatory Visit
Admission: RE | Admit: 2020-04-21 | Discharge: 2020-04-21 | Disposition: A | Discharge status: Home / Self Care | Payer: Managed Care, Other (non HMO) | Source: Ambulatory Visit | Attending: Oncology | Admitting: Oncology

## 2020-04-21 ENCOUNTER — Other Ambulatory Visit: Payer: Self-pay

## 2020-04-21 DIAGNOSIS — E041 Nontoxic single thyroid nodule: Secondary | ICD-10-CM | POA: Diagnosis not present

## 2020-04-22 ENCOUNTER — Telehealth: Payer: Self-pay | Admitting: *Deleted

## 2020-04-22 ENCOUNTER — Other Ambulatory Visit: Payer: Self-pay | Admitting: *Deleted

## 2020-04-22 DIAGNOSIS — E041 Nontoxic single thyroid nodule: Secondary | ICD-10-CM

## 2020-04-22 MED ORDER — DIAZEPAM 5 MG PO TABS
ORAL_TABLET | ORAL | 0 refills | Status: DC
Start: 2020-04-22 — End: 2021-01-18

## 2020-04-22 NOTE — Telephone Encounter (Signed)
Pt made aware of thyroid US results and Dr. Gary Fleet recommendation to pursue biopsy of the right inferior thyroid nodule. Informed pt that will be notified once scheduled for biopsy and to see Dr. Grayland Ormond 1 week after biopsy. Pt verbalized understanding.

## 2020-04-26 ENCOUNTER — Other Ambulatory Visit: Payer: Self-pay | Admitting: Radiology

## 2020-04-26 ENCOUNTER — Other Ambulatory Visit: Payer: Self-pay

## 2020-04-26 ENCOUNTER — Ambulatory Visit
Admission: RE | Admit: 2020-04-26 | Discharge: 2020-04-26 | Disposition: A | Discharge status: Home / Self Care | Payer: Managed Care, Other (non HMO) | Source: Ambulatory Visit | Attending: Oncology | Admitting: Oncology

## 2020-04-26 DIAGNOSIS — E041 Nontoxic single thyroid nodule: Secondary | ICD-10-CM | POA: Diagnosis present

## 2020-04-26 NOTE — Discharge Instructions (Signed)
Thyroid Needle Biopsy, Care After This sheet gives you information about how to care for yourself after your procedure. Your health care provider may also give you more specific instructions. If you have problems or questions, contact your health care provider. What can I expect after the procedure? After the procedure, it is common to have:  Soreness and tenderness that lasts for a few days.  Bruising where the needle was inserted (puncture site). Follow these instructions at home:   Take over-the-counter and prescription medicines only as told by your health care provider.  To help ease discomfort, keep your head raised (elevated) when you are lying down. When you move from lying down to sitting up, use both hands to support the back of your head and neck.  Check your puncture site every day for signs of infection. Check for: ? Redness, swelling, or pain. ? Fluid or blood. ? Warmth. ? Pus or a bad smell.  Return to your normal activities as told by your health care provider. Ask your health care provider what activities are safe for you.  Keep all follow-up visits as told by your health care provider. This is important. Contact a health care provider if:  You have redness, swelling, or pain around your puncture site.  You have fluid or blood coming from your puncture site.  Your puncture site feels warm to the touch.  You have pus or a bad smell coming from your puncture site.  You have a fever. Get help right away if:  You have severe bleeding from the puncture site.  You have difficulty swallowing.  You have swollen glands (lymph nodes) in your neck. Summary  It is common to have some bruising and soreness where the needle was inserted in your lower front neck area (puncture site).  Check your puncture site every day for signs of infection, such as redness, swelling, or pain.  Get help right away if you have severe bleeding from your puncture site. This  information is not intended to replace advice given to you by your health care provider. Make sure you discuss any questions you have with your health care provider. Document Revised: 04/20/2017 Document Reviewed: 02/19/2017 Elsevier Patient Education  2020 [employer]  

## 2020-04-27 ENCOUNTER — Other Ambulatory Visit: Payer: Self-pay | Admitting: *Deleted

## 2020-04-27 ENCOUNTER — Telehealth: Payer: Self-pay | Admitting: *Deleted

## 2020-04-27 MED ORDER — HYDROCODONE-ACETAMINOPHEN 5-325 MG PO TABS: 1.0000 | ORAL_TABLET | Freq: Four times a day (QID) | ORAL | 0 refills | Status: DC | PRN

## 2020-04-27 NOTE — Telephone Encounter (Signed)
Patient called requesting a return call from [facility] stating that she is in a lot of pain after her biopsy yesterday. [phone]

## 2020-04-27 NOTE — Telephone Encounter (Signed)
We can give her 30 tabs Vicodin.  Thanks!

## 2020-04-27 NOTE — Telephone Encounter (Signed)
Spoke with pt's daughter who states that pt is at home in bed with pain around biopsy site that radiates to her ear. Pt has been taking Tylenol that does not help relieve pain. Unable to take ibuprofen due to previous gastric surgery.   Please advise on how to manage pain or if pt needs to be seen. Pt has taken hydrocodone and tramadol previously for acute pain.

## 2020-04-27 NOTE — Telephone Encounter (Signed)
Left message on daughter's number regarding new prescription sent into pharmacy. Instructed to call back if pain worsens.

## 2020-04-27 NOTE — Telephone Encounter (Signed)
Prescription has been sent to your in basket to sign. Thanks!

## 2020-04-29 LAB — CYTOLOGY - NON PAP

## 2020-05-01 NOTE — Progress Notes (Signed)
[place]  [phone] [phone]) [phone]  ID: Andrea Pollard OB: March 09, 1956  MR#: [id number]  WYO#:[id number]  Patient Care Team: Ellamae Sia, MD as PCP - General (Internal Medicine) Minna Merritts, MD as Consulting Physician (Cardiology) Telford Nab, RN as Registered Nurse  I connected with Andrea Pollard on 05/05/20 at  2:00 PM EST by video enabled telemedicine visit and verified that I am speaking with the correct person using two identifiers.   I discussed the limitations, risks, security and privacy concerns of performing an evaluation and management service by telemedicine and the availability of in-person appointments. I also discussed with the patient that there may be a patient responsible charge related to this service. The patient expressed understanding and agreed to proceed.   Other persons participating in the visit and their role in the encounter: Patient, MD.  Patient's location: Home. Provider's location: Clinic.  CHIEF COMPLAINT: Mass of upper lobe of right lung, thyroid nodule.  INTERVAL HISTORY: Patient agreed to video enabled telemedicine visit for further evaluation and discussion of her thyroid biopsy results.  She is recently admitted to [facility] with leg pain and increasing shortness of breath, but work-up was negative for DVT/PE.  She was incidentally noted to have a thyroid nodule.  She currently feels well and is back to her baseline.  She has no neurologic complaints. She denies any recent fevers or illnesses. She has a good appetite and denies weight loss.  She denies any chest pain, shortness of breath, cough, or hemoptysis.  She denies any nausea, vomiting, constipation, or diarrhea. She has no urinary complaints.  Patient offers no further specific complaints today.  REVIEW OF SYSTEMS:   Review of Systems  Constitutional: Negative.  Negative for fever, malaise/fatigue and weight loss.  Respiratory: Negative.   Negative for cough, hemoptysis and shortness of breath.   Cardiovascular: Negative.  Negative for chest pain and leg swelling.  Gastrointestinal: Negative.  Negative for abdominal pain.  Genitourinary: Negative.  Negative for dysuria.  Musculoskeletal: Positive for back pain. Negative for neck pain.  Skin: Negative.  Negative for rash.  Neurological: Negative.  Negative for sensory change, focal weakness and weakness.  Psychiatric/Behavioral: Negative.  The patient is not nervous/anxious and does not have insomnia.     As per HPI. Otherwise, a complete review of systems is negative.  PAST MEDICAL HISTORY: Past Medical History:  Diagnosis Date  . Anxiety   . Asthma   . Complication of anesthesia   . COPD (chronic obstructive pulmonary disease) (HCC)    diagnosed from pulmonary function tests  . Depression   . Diabetes mellitus without complication ([place])   . DVT (deep venous thrombosis) ([place])   . Dvt femoral (deep venous thrombosis) ([place]) 2018   in lung also.  treated with xarelto for 6 months  . GERD (gastroesophageal reflux disease)   . 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 History of hiatal hernia   . History of kidney stones 2019  . History of methicillin resistant staphylococcus aureus (MRSA) 2013   in neck due to ingrown hair. had surgery to drain cyst  . History of palpitations 2019   due to a panic attack  . Hypertension   . Lung nodule 2019   being followed by dr. Grayland Ormond, dr Genevive Bi and dr. Mortimer Fries  . Pneumonia 04/2016  . PONV (postoperative nausea and vomiting)    NAUSEATED  . Pulmonary emboli ([place]) 2018  . Sleep apnea  CPAP  . Vertigo     PAST SURGICAL HISTORY: Past Surgical History:  Procedure Laterality Date  . ABDOMINAL HYSTERECTOMY  1994  . CESAREAN SECTION    . CHOLECYSTECTOMY  2013  . CYST REMOVAL NECK  2013   was ingrown hair . positive for mrsa at that time prior to removal in OR  . CYSTOSCOPY WITH URETEROSCOPY AND STENT PLACEMENT Right 05/31/2018    Procedure: CYSTOSCOPY WITH URETEROSCOPY, LASER LITHOTRIPSY AND STENT PLACEMENT;  Surgeon: Billey Co, MD;  Location: [facility] ORS;  Service: Urology;  Laterality: Right;  . ELECTROMAGNETIC NAVIGATION BROCHOSCOPY N/A 08/15/2016   Procedure: ELECTROMAGNETIC NAVIGATION BRONCHOSCOPY;  Surgeon: Flora Lipps, MD;  Location: [facility] ORS;  Service: Cardiopulmonary;  Laterality: N/A;  . ELECTROMAGNETIC NAVIGATION BROCHOSCOPY Right 11/07/2017   Procedure: ELECTROMAGNETIC NAVIGATION BRONCHOSCOPY;  Surgeon: Flora Lipps, MD;  Location: [facility] ORS;  Service: Cardiopulmonary;  Laterality: Right;  . LAPAROSCOPIC GASTRIC SLEEVE RESECTION  2015   lost 75 pounds    FAMILY HISTORY: Family History  Problem Relation Age of Onset  . Stroke Father   . Ovarian cancer Maternal Grandmother   . Breast cancer Neg Hx     ADVANCED DIRECTIVES (Y/N):  N  HEALTH MAINTENANCE: Social History   Tobacco Use  . Smoking status: Former Smoker    Packs/day: 1.00    Years: 5.00    Pack years: 5.00    Types: Cigarettes    Quit date: 08/12/1991    Years since quitting: 28.7  . Smokeless tobacco: Never Used  Vaping Use  . Vaping Use: Never used  Substance Use Topics  . Alcohol use: No  . Drug use: No     Colonoscopy:  PAP:  Bone density:  Lipid panel:  Allergies  Allergen Reactions  . Ibuprofen Other (See Comments)    Cannot Use due to Gastric SLEEVE SURGERY  . Levaquin [Levofloxacin In D5w] Hives  . Sulfamethoxazole-Trimethoprim Hives  . Nitroglycerin Other (See Comments)    Severe Hypotension    Current Outpatient Medications  Medication Sig Dispense Refill  . albuterol (PROAIR HFA) 108 (90 Base) MCG/ACT inhaler Inhale 2 Inhalers into the lungs as needed for wheezing or shortness of breath.     Marland Kitchen atorvastatin (LIPITOR) 80 MG tablet Take 80 mg by mouth at bedtime.   2  . busPIRone (BUSPAR) 5 MG tablet Take 5 mg by mouth 2 (two) times daily.     . cholecalciferol (VITAMIN D3) 25 MCG (1000 UNIT) tablet Take  1,000 Units by mouth daily.    . diazepam (VALIUM) 5 MG tablet Take 1 tablet by mouth 1 hour prior to procedure. 2 tablet 0  . dicyclomine (BENTYL) 20 MG tablet Take 1 tablet (20 mg total) by mouth every 6 (six) hours as needed for up to 7 days for spasms. 28 tablet 0  . ELDERBERRY PO Take 1 tablet by mouth daily.    Marland Kitchen FLOVENT HFA 220 MCG/ACT inhaler Inhale 2 puffs into the lungs daily.   5  . HYDROcodone-acetaminophen (NORCO/VICODIN) 5-325 MG tablet Take 1 tablet by mouth every 6 (six) hours as needed for moderate pain. 30 tablet 0  . hydrOXYzine (VISTARIL) 25 MG capsule Take 50 mg by mouth 2 (two) times daily.     . lansoprazole (PREVACID) 15 MG capsule Take 15 mg by mouth at bedtime.     Marland Kitchen lisinopril (PRINIVIL,ZESTRIL) 5 MG tablet Take 5 mg by mouth daily.  3  . metFORMIN (GLUCOPHAGE-XR) 500 MG 24 hr tablet Take 1,000 mg  by mouth 2 (two) times daily.   2  . ONETOUCH VERIO test strip TEST ONCE D 100 each 3  . predniSONE (DELTASONE) 10 MG tablet Take 1 tablet (10 mg total) by mouth daily. 42 tablet 0  . sertraline (ZOLOFT) 100 MG tablet Take 100 mg by mouth daily.  3  . traMADol (ULTRAM) 50 MG tablet Take 1 tablet (50 mg total) by mouth every 6 (six) hours as needed. 12 tablet 0   No current facility-administered medications for this visit.    OBJECTIVE: There were no vitals filed for this visit.   There is no height or weight on file to calculate BMI.    ECOG FS:0 - Asymptomatic  General: Well-developed, well-nourished, no acute distress. HEENT: Normocephalic. Neuro: Alert, answering all questions appropriately. Cranial nerves grossly intact. Psych: Normal affect.   LAB RESULTS:  Lab Results  Component Value Date   NA 139 12/07/2019   K 3.8 12/07/2019   CL 105 12/07/2019   CO2 26 12/07/2019   GLUCOSE 250 (H) 12/07/2019   BUN 17 12/07/2019   CREATININE 0.70 02/26/2020   CALCIUM 8.9 12/07/2019   PROT 7.4 11/03/2019   ALBUMIN 3.9 11/03/2019   AST 29 11/03/2019   ALT 25  11/03/2019   ALKPHOS 78 11/03/2019   BILITOT 0.5 11/03/2019   GFRNONAA >60 12/07/2019   GFRAA >60 12/07/2019    Lab Results  Component Value Date   WBC 7.0 12/07/2019   NEUTROABS 4.1 09/21/2018   HGB 13.2 12/07/2019   HCT 40.7 12/07/2019   MCV 81.4 12/07/2019   PLT 206 12/07/2019     STUDIES: [place] FNA BX THYROID 1ST LESION AFIRMA  Result Date: 04/26/2020 INDICATION: Indeterminate thyroid nodule EXAM: ULTRASOUND GUIDED FINE NEEDLE ASPIRATION OF INDETERMINATE THYROID NODULE COMPARISON:  Ultrasound thyroid dated April 21, 2020 MEDICATIONS: Lidocaine 1% 2 mL COMPLICATIONS: None immediate. TECHNIQUE: Informed written consent was obtained from the patient after a discussion of the risks, benefits and alternatives to treatment. Questions regarding the procedure were encouraged and answered. A timeout was performed prior to the initiation of the procedure. Pre-procedural ultrasound scanning demonstrated unchanged size and appearance of the indeterminate nodule within the right lobe The procedure was planned. The neck was prepped in the usual sterile fashion, and a sterile drape was applied covering the operative field. A timeout was performed prior to the initiation of the procedure. Local anesthesia was provided with 1% lidocaine. Under direct ultrasound guidance, 6 FNA biopsies were performed of the right inferior nodule with a 25 gauge needle. Two samples were sent to [facility] per ordering MD. Multiple ultrasound images were saved for procedural documentation purposes. The samples were prepared and submitted to pathology. Limited post procedural scanning was negative for hematoma or additional complication. Dressings were placed. The patient tolerated the above procedures procedure well without immediate postprocedural complication. FINDINGS: Nodule reference number based on prior diagnostic ultrasound: 1 Maximum size: 2.9 cm Location: Right; Inferior ACR TI-RADS risk category: TR5 (>/= 7 points)  Reason for biopsy: meets ACR TI-RADS criteria Ultrasound imaging confirms appropriate placement of the needles within the thyroid nodule. IMPRESSION: Technically successful ultrasound guided fine needle aspiration of right inferior nodule. The nodule had a calcified rim that was difficult to penetrate. Read by: Rushie Nyhan NP Electronically Signed   By: Jacqulynn Cadet M.D.   On: 04/26/2020 15:06   US THYROID  Result Date: 04/21/2020 CLINICAL DATA:  64 year old female with thyroid nodule EXAM: THYROID ULTRASOUND TECHNIQUE: Ultrasound examination of the thyroid gland  and adjacent soft tissues was performed. COMPARISON:  None. FINDINGS: Parenchymal Echotexture: Moderately heterogenous Isthmus: 0.4 cm Right lobe: 4.4 cm x 2.1 cm x 2.4 cm Left lobe: 3.9 cm x 1.4 cm x 1.3 cm _________________________________________________________ Estimated total number of nodules >/= 1 cm: 1 Number of spongiform nodules >/=  2 cm not described below (TR1): 0 Number of mixed cystic and solid nodules >/= 1.5 cm not described below ([place]): 0 _________________________________________________________ Nodule # 1: Location: Right; Inferior Maximum size: 2.9 cm; Other 2 dimensions: 2.6 cm x 1.8 cm Composition: solid/almost completely solid (2) Echogenicity: hypoechoic (2) Shape: not taller-than-wide (0) Margins: ill-defined (0) Echogenic foci: punctate echogenic foci (3) ACR TI-RADS total points: 7. ACR TI-RADS risk category: TR5 (>/= 7 points). ACR TI-RADS recommendations: Nodule meets criteria for biopsy _________________________________________________________ No adenopathy IMPRESSION: Right inferior thyroid nodule meets criteria for biopsy, as designated by the newly established ACR TI-RADS criteria, and referral for biopsy is recommended. Recommendations follow those established by the new ACR TI-RADS criteria (J Am Coll Radiol 2119;41:740-814). Electronically Signed   By: Corrie Mckusick D.O.   On: 04/21/2020 16:01     ASSESSMENT: Mass of upper lobe of right lung  PLAN:    1. Mass of upper lobe of right lung: Patient underwent biopsy on November 07, 2017 that was negative for malignancy.  Despite the negative biopsy, there was still concern for malignancy.  CT scan results from February 26, 2020 reviewed independently with persistent yet unchanged 1.4 cm nodule in the right upper lobe.  Patient also has multiple other pulmonary nodules that are essentially unchanged.  Recent CT scan at [facility] was essentially unchanged.  No intervention is needed at this time. Because of patient's increased anxiety, she continues to request imaging and clinic visits be on the same day.  She has been instructed to keep her previously scheduled follow-up appointment in April 2020 for repeat imaging and further evaluation. 2. Bilateral PE/left leg DVT: Resolved.  Patient has now completed anticoagulation.  Diagnosed by CT scan on November 28, 2016. Patient has no obvious transient risk factors. Hypercoagulable workup was negative except for an elevated DRVVT which can be attributed to the anticoagulation she is taking at that time.  Recent work-up at [facility] was also reported as negative. 3.  Back pain: Continue follow-up with orthopedics as scheduled. 4.  Thyroid nodule: Benign.  I provided 20 minutes of face-to-face video visit time during this encounter which included chart review, counseling, and coordination of care as documented above.    Patient expressed understanding and was in agreement with this plan. She also understands that She can call clinic at any time with any questions, concerns, or complaints.   Cancer Staging No matching staging information was found for the patient.  Lloyd Huger, MD   05/05/2020 6:58 AM

## 2020-05-03 ENCOUNTER — Encounter: Payer: Self-pay | Admitting: Oncology

## 2020-05-04 ENCOUNTER — Inpatient Hospital Stay: Payer: Managed Care, Other (non HMO) | Attending: Oncology | Admitting: Oncology

## 2020-05-04 ENCOUNTER — Other Ambulatory Visit: Payer: Self-pay

## 2020-05-04 DIAGNOSIS — Z87891 Personal history of nicotine dependence: Secondary | ICD-10-CM | POA: Insufficient documentation

## 2020-05-04 DIAGNOSIS — Z79899 Other long term (current) drug therapy: Secondary | ICD-10-CM | POA: Insufficient documentation

## 2020-05-04 DIAGNOSIS — R918 Other nonspecific abnormal finding of lung field: Secondary | ICD-10-CM | POA: Insufficient documentation

## 2020-05-04 DIAGNOSIS — Z86711 Personal history of pulmonary embolism: Secondary | ICD-10-CM | POA: Insufficient documentation

## 2020-05-04 DIAGNOSIS — M549 Dorsalgia, unspecified: Secondary | ICD-10-CM | POA: Insufficient documentation

## 2020-05-04 DIAGNOSIS — E041 Nontoxic single thyroid nodule: Secondary | ICD-10-CM | POA: Insufficient documentation

## 2020-05-04 DIAGNOSIS — Z86718 Personal history of other venous thrombosis and embolism: Secondary | ICD-10-CM | POA: Insufficient documentation

## 2020-05-04 DIAGNOSIS — F419 Anxiety disorder, unspecified: Secondary | ICD-10-CM | POA: Insufficient documentation

## 2020-05-17 NOTE — Progress Notes (Deleted)
By my[facility] Office Note  Date:  05/17/2020   ID:  Andrea Pollard, DOB 1955/06/04, MRN [id number]  PCP:  Ellamae Sia, MD   No chief complaint on file.   HPI:  Andrea Pollard is a 64 y.o. woman  history of  Poorly controlled diabetes, HBA1C 9 gastric bypass about 2 years ago and has lost 80 pounds.  asthma  smoked for about 10 years a pack per day but quit 25 years ago.   episodes of bronchitis. episode of shortness of breath and tightness in her throat  coronary disease seen on CT scan/PET scan Who presents for follow-up of his chest pain  Last seen in clinic by myself May 2018  Echocardiogram March 2019 Normal ejection fraction  Recently seen at [facility] emergency room for chest pain April 08, 2020 Troponin normal Concern for anxiety CTA chest no PE Outpatient stress testing recommended   She reports a long history of smoking but quit 25 years ago Felt her diabetes was improved but review of recent numbers shows hemoglobin A1c of 9 Recently started on Lipitor for hyperlipidemia  Recent imaging performed for lung nodule (Hypermetabolic spiculated right upper lobe nodule, worrisome for primary bronchogenic carcinoma.) Several CT scans reviewed on today's visit including PET scan  08/07/16 This shows significant coronary calcifications predominantly in the proximal LAD Images reviewed with her in detail  She reports having some shortness of breath on exertion No regular exercise program   pulmonary function studies done revealing an FEV1 and DLCO between 70 and 80%.   navigational bronchoscopy performed by Dr. Mortimer Fries and this was negative for malignancy only showing some inflammation.   EKG personally reviewed by myself on todays visit Shows normal sinus rhythm with no significant ST or T-wave changes   PMH:   has a past medical history of Anxiety, Asthma, Complication of anesthesia, COPD (chronic obstructive pulmonary disease) ([place]), Depression, Diabetes  mellitus without complication ([place]), DVT (deep venous thrombosis) ([place]), Dvt femoral (deep venous thrombosis) ([place]) (2018), GERD (gastroesophageal reflux disease), Hepatitis, History of hiatal hernia, History of kidney stones (2019), History of methicillin resistant staphylococcus aureus (MRSA) (2013), History of palpitations (2019), Hypertension, Lung nodule (2019), Pneumonia (04/2016), PONV (postoperative nausea and vomiting), Pulmonary emboli ([place]) (2018), Sleep apnea, and Vertigo.  PSH:    Past Surgical History:  Procedure Laterality Date  . ABDOMINAL HYSTERECTOMY  1994  . CESAREAN SECTION    . CHOLECYSTECTOMY  2013  . CYST REMOVAL NECK  2013   was ingrown hair . positive for mrsa at that time prior to removal in OR  . CYSTOSCOPY WITH URETEROSCOPY AND STENT PLACEMENT Right 05/31/2018   Procedure: CYSTOSCOPY WITH URETEROSCOPY, LASER LITHOTRIPSY AND STENT PLACEMENT;  Surgeon: Billey Co, MD;  Location: [facility] ORS;  Service: Urology;  Laterality: Right;  . ELECTROMAGNETIC NAVIGATION BROCHOSCOPY N/A 08/15/2016   Procedure: ELECTROMAGNETIC NAVIGATION BRONCHOSCOPY;  Surgeon: Flora Lipps, MD;  Location: [facility] ORS;  Service: Cardiopulmonary;  Laterality: N/A;  . ELECTROMAGNETIC NAVIGATION BROCHOSCOPY Right 11/07/2017   Procedure: ELECTROMAGNETIC NAVIGATION BRONCHOSCOPY;  Surgeon: Flora Lipps, MD;  Location: [facility] ORS;  Service: Cardiopulmonary;  Laterality: Right;  . LAPAROSCOPIC GASTRIC SLEEVE RESECTION  2015   lost 75 pounds    Current Outpatient Medications  Medication Sig Dispense Refill  . albuterol (PROAIR HFA) 108 (90 Base) MCG/ACT inhaler Inhale 2 Inhalers into the lungs as needed for wheezing or shortness of breath.     Marland Kitchen atorvastatin (LIPITOR) 80 MG tablet Take 80 mg by mouth at  bedtime.   2  . busPIRone (BUSPAR) 5 MG tablet Take 5 mg by mouth 2 (two) times daily.     . cholecalciferol (VITAMIN D3) 25 MCG (1000 UNIT) tablet Take 1,000 Units by mouth daily.    . diazepam (VALIUM) 5 MG  tablet Take 1 tablet by mouth 1 hour prior to procedure. 2 tablet 0  . dicyclomine (BENTYL) 20 MG tablet Take 1 tablet (20 mg total) by mouth every 6 (six) hours as needed for up to 7 days for spasms. 28 tablet 0  . ELDERBERRY PO Take 1 tablet by mouth daily.    Marland Kitchen FLOVENT HFA 220 MCG/ACT inhaler Inhale 2 puffs into the lungs daily.   5  . HYDROcodone-acetaminophen (NORCO/VICODIN) 5-325 MG tablet Take 1 tablet by mouth every 6 (six) hours as needed for moderate pain. 30 tablet 0  . hydrOXYzine (VISTARIL) 25 MG capsule Take 50 mg by mouth 2 (two) times daily.     . lansoprazole (PREVACID) 15 MG capsule Take 15 mg by mouth at bedtime.     Marland Kitchen lisinopril (PRINIVIL,ZESTRIL) 5 MG tablet Take 5 mg by mouth daily.  3  . metFORMIN (GLUCOPHAGE-XR) 500 MG 24 hr tablet Take 1,000 mg by mouth 2 (two) times daily.   2  . ONETOUCH VERIO test strip TEST ONCE D 100 each 3  . predniSONE (DELTASONE) 10 MG tablet Take 1 tablet (10 mg total) by mouth daily. 42 tablet 0  . sertraline (ZOLOFT) 100 MG tablet Take 100 mg by mouth daily.  3  . traMADol (ULTRAM) 50 MG tablet Take 1 tablet (50 mg total) by mouth every 6 (six) hours as needed. 12 tablet 0   No current facility-administered medications for this visit.     Allergies:   Ibuprofen, Levaquin [levofloxacin in d5w], Sulfamethoxazole-trimethoprim, and Nitroglycerin   Social History:  The patient  reports that she quit smoking about 28 years ago. Her smoking use included cigarettes. She has a 5.00 pack-year smoking history. She has never used smokeless tobacco. She reports that she does not drink alcohol and does not use drugs.   Family History:   family history includes Ovarian cancer in her maternal grandmother; Stroke in her father.    Review of Systems: Review of Systems  Constitutional: Negative.   Respiratory: Positive for shortness of breath.   Cardiovascular: Negative.        Occasional chest tightness  Gastrointestinal: Negative.   Musculoskeletal:  Negative.   Neurological: Negative.   Psychiatric/Behavioral: Negative.   All other systems reviewed and are negative.    PHYSICAL EXAM: VS:  There were no vitals taken for this visit. , BMI There is no height or weight on file to calculate BMI. GEN: Well nourished, well developed, in no acute distress , obese HEENT: normal  Neck: no JVD, carotid bruits, or masses Cardiac: RRR; no murmurs, rubs, or gallops,no edema  Respiratory:  clear to auscultation bilaterally, normal work of breathing GI: soft, nontender, nondistended, + BS MS: no deformity or atrophy  Skin: warm and dry, no rash Neuro:  Strength and sensation are intact Psych: euthymic mood, full affect    Recent Labs: 11/03/2019: ALT 25 12/07/2019: BUN 17; Hemoglobin 13.2; Platelets 206; Potassium 3.8; Sodium 139 02/26/2020: Creatinine, Ser 0.70    Lipid Panel No results found for: CHOL, HDL, LDLCALC, TRIG    Wt Readings from Last 3 Encounters:  02/26/20 228 lb (103.4 kg)  12/07/19 230 lb (104.3 kg)  11/03/19 230 lb (104.3 kg)  ASSESSMENT AND PLAN:  Coronary artery disease of native artery of native heart with stable angina pectoris ([place]) -  Long discussion with her concerning recent coronary calcifications seen on CT scans  She does have some shortness of breath, occasional chest tightness Unable to treadmill given arthritis Myoview has been ordered, lexiscan We will call her with the results of her stress test We have recommended she take low-dose aspirin, stay on her statin  Morbid obesity ([place]) -  We have encouraged continued exercise, careful diet management in an effort to lose weight.  Solitary pulmonary nodule -  Followed by Dr. 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 Grayland Ormond and will be referred back for ongoing care of the medical issues detailed above   Total encounter time more than 60 minutes  Greater than 50% was spent in counseling and coordination of care with the patient    No orders of the defined types were placed in this encounter.    Signed, Esmond Plants, M.D., Ph.D. 05/17/2020  [place], [place]

## 2020-05-18 ENCOUNTER — Ambulatory Visit: Payer: Managed Care, Other (non HMO) | Admitting: Cardiovascular Disease

## 2020-05-29 ENCOUNTER — Emergency Department: Payer: Managed Care, Other (non HMO)

## 2020-05-29 ENCOUNTER — Encounter: Payer: Self-pay | Admitting: Emergency Medicine

## 2020-05-29 ENCOUNTER — Emergency Department
Admission: EM | Admit: 2020-05-29 | Discharge: 2020-05-30 | Disposition: A | Discharge status: Home / Self Care | Payer: Managed Care, Other (non HMO) | Attending: Emergency Medicine | Admitting: Emergency Medicine

## 2020-05-29 ENCOUNTER — Other Ambulatory Visit: Payer: Self-pay

## 2020-05-29 DIAGNOSIS — E119 Type 2 diabetes mellitus without complications: Secondary | ICD-10-CM | POA: Diagnosis not present

## 2020-05-29 DIAGNOSIS — Z86718 Personal history of other venous thrombosis and embolism: Secondary | ICD-10-CM | POA: Diagnosis not present

## 2020-05-29 DIAGNOSIS — I10 Essential (primary) hypertension: Secondary | ICD-10-CM | POA: Diagnosis not present

## 2020-05-29 DIAGNOSIS — Z87891 Personal history of nicotine dependence: Secondary | ICD-10-CM | POA: Diagnosis not present

## 2020-05-29 DIAGNOSIS — Z79899 Other long term (current) drug therapy: Secondary | ICD-10-CM | POA: Insufficient documentation

## 2020-05-29 DIAGNOSIS — Z7984 Long term (current) use of oral hypoglycemic drugs: Secondary | ICD-10-CM | POA: Diagnosis not present

## 2020-05-29 DIAGNOSIS — R55 Syncope and collapse: Secondary | ICD-10-CM | POA: Diagnosis present

## 2020-05-29 DIAGNOSIS — J449 Chronic obstructive pulmonary disease, unspecified: Secondary | ICD-10-CM | POA: Insufficient documentation

## 2020-05-29 DIAGNOSIS — R42 Dizziness and giddiness: Secondary | ICD-10-CM

## 2020-05-29 DIAGNOSIS — R519 Headache, unspecified: Secondary | ICD-10-CM | POA: Diagnosis not present

## 2020-05-29 DIAGNOSIS — I2510 Atherosclerotic heart disease of native coronary artery without angina pectoris: Secondary | ICD-10-CM | POA: Insufficient documentation

## 2020-05-29 DIAGNOSIS — Z20822 Contact with and (suspected) exposure to covid-19: Secondary | ICD-10-CM | POA: Insufficient documentation

## 2020-05-29 LAB — CBC
HCT: 42.4 % (ref 36.0–46.0)
Hemoglobin: 13.6 g/dL (ref 12.0–15.0)
MCH: 25.9 pg — ABNORMAL LOW (ref 26.0–34.0)
MCHC: 32.1 g/dL (ref 30.0–36.0)
MCV: 80.6 fL (ref 80.0–100.0)
Platelets: 165 10*3/uL (ref 150–400)
RBC: 5.26 MIL/uL — ABNORMAL HIGH (ref 3.87–5.11)
RDW: 14.4 % (ref 11.5–15.5)
WBC: 8.3 10*3/uL (ref 4.0–10.5)
nRBC: 0 % (ref 0.0–0.2)

## 2020-05-29 LAB — HEPATIC FUNCTION PANEL
ALT: 33 U/L (ref 0–44)
AST: 40 U/L (ref 15–41)
Albumin: 3.9 g/dL (ref 3.5–5.0)
Alkaline Phosphatase: 80 U/L (ref 38–126)
Bilirubin, Direct: 0.1 mg/dL (ref 0.0–0.2)
Indirect Bilirubin: 0.4 mg/dL (ref 0.3–0.9)
Total Bilirubin: 0.5 mg/dL (ref 0.3–1.2)
Total Protein: 7.2 g/dL (ref 6.5–8.1)

## 2020-05-29 LAB — URINALYSIS, COMPLETE (UACMP) WITH MICROSCOPIC
Bacteria, UA: NONE SEEN
Bilirubin Urine: NEGATIVE
Glucose, UA: >=500 mg/dL — AB
Ketones, ur: NEGATIVE mg/dL
Nitrite: NEGATIVE
Protein, ur: NEGATIVE mg/dL
Specific Gravity, Urine: 1.028 (ref 1.005–1.030)
pH: 6 (ref 5.0–8.0)

## 2020-05-29 LAB — BASIC METABOLIC PANEL
Anion gap: 11 (ref 5–15)
BUN: 17 mg/dL (ref 8–23)
CO2: 27 mmol/L (ref 22–32)
Calcium: 9.8 mg/dL (ref 8.9–10.3)
Chloride: 105 mmol/L (ref 98–111)
Creatinine, Ser: 0.69 mg/dL (ref 0.44–1.00)
GFR, Estimated: >60 mL/min (ref >60)
Glucose, Bld: 221 mg/dL — ABNORMAL HIGH (ref 70–99)
Potassium: 3.9 mmol/L (ref 3.5–5.1)
Sodium: 143 mmol/L (ref 135–145)

## 2020-05-29 LAB — TROPONIN I (HIGH SENSITIVITY)
Troponin I (High Sensitivity): 8 ng/L (ref <18)
Troponin I (High Sensitivity): 9 ng/L (ref <18)

## 2020-05-29 LAB — CBG MONITORING, ED: Glucose-Capillary: 245 mg/dL — ABNORMAL HIGH (ref 70–99)

## 2020-05-29 LAB — TSH: TSH: 1.704 u[IU]/mL (ref 0.350–4.500)

## 2020-05-29 MED ORDER — LACTATED RINGERS IV BOLUS
1000.0000 mL | Freq: Once | INTRAVENOUS | Status: AC
  Administered 2020-05-29: 1000 mL via INTRAVENOUS

## 2020-05-29 MED ORDER — ALUM & MAG HYDROXIDE-SIMETH 200-200-20 MG/5ML PO SUSP
30.0000 mL | Freq: Once | ORAL | Status: AC
  Administered 2020-05-29: 30 mL via ORAL
  Filled 2020-05-29: qty 30

## 2020-05-29 MED ORDER — ACETAMINOPHEN 500 MG PO TABS: 1000.0000 mg | ORAL_TABLET | Freq: Once | ORAL | Status: AC

## 2020-05-29 MED ORDER — SODIUM CHLORIDE 0.9 % IV SOLN: Freq: Once | INTRAVENOUS | Status: AC

## 2020-05-29 MED ORDER — LIDOCAINE VISCOUS HCL 2 % MT SOLN
15.0000 mL | Freq: Once | OROMUCOSAL | Status: AC
  Administered 2020-05-29: 15 mL via ORAL
  Filled 2020-05-29: qty 15

## 2020-05-29 MED ORDER — ONDANSETRON HCL 4 MG/2ML IJ SOLN: 4.0000 mg | Freq: Once | INTRAMUSCULAR | Status: DC

## 2020-05-29 MED ORDER — ACETAMINOPHEN 500 MG PO TABS
ORAL_TABLET | ORAL | Status: AC
  Administered 2020-05-29: 1000 mg via ORAL
  Filled 2020-05-29: qty 2

## 2020-05-29 NOTE — ED Notes (Signed)
Called lab. State they should be able to add on trop along with other additional labs currently needing to be "collected".

## 2020-05-29 NOTE — ED Notes (Signed)
Verbal okay from provider JC to give pt sip of water. Given.

## 2020-05-29 NOTE — ED Provider Notes (Signed)
[facility] Emergency Department Provider Note  ____________________________________________  Time seen: Approximately 8:45 PM  I have reviewed the triage vital signs and the nursing notes.   HISTORY  Chief Complaint Loss of Consciousness    HPI Andrea Pollard is a 65 y.o. female who presents the emergency department for multiple syncopal episodes today.  Patient states that this morning she woke up with a headache.  She has no history of headaches, states that she has had 1 migraine in her life.  Patient states that this is very atypical and she also had some dizziness.  During the day she had not noticed any other symptoms to include URI symptoms, chest pain, shortness of breath, domino pain or nausea or vomiting.  Patient states that she may have a urinary tract infection as she has some dysuria and polyuria starting yesterday.  Again no fevers with this.  This afternoon patient bent down to pick up some laundry, had a syncopal episode.  She states that every time she tries to get up whether from a sitting or lying down position she has a syncopal episode.  No reported injuries from syncope.  Patient is still endorsing a headache but denies any other symptoms currently.  Has a history of anxiety, COPD, diabetes, DVT, GERD, hypertension, pulmonary nodule, PE sleep apnea and a history of vertigo.         Past Medical History:  Diagnosis Date  . Anxiety   . Asthma   . Complication of anesthesia   . COPD (chronic obstructive pulmonary disease) (HCC)    diagnosed from pulmonary function tests  . Depression   . Diabetes mellitus without complication ([place])   . DVT (deep venous thrombosis) ([place])   . Dvt femoral (deep venous thrombosis) ([place]) 2018   in lung also.  treated with xarelto for 6 months  . GERD (gastroesophageal reflux disease)   . 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 History of hiatal hernia   . History of kidney stones 2019  . History of  methicillin resistant staphylococcus aureus (MRSA) 2013   in neck due to ingrown hair. had surgery to drain cyst  . History of palpitations 2019   due to a panic attack  . Hypertension   . Lung nodule 2019   being followed by dr. Grayland Ormond, dr Genevive Bi and dr. Mortimer Fries  . Pneumonia 04/2016  . PONV (postoperative nausea and vomiting)    NAUSEATED  . Pulmonary emboli ([place]) 2018  . Sleep apnea    CPAP  . Vertigo     Patient Active Problem List   Diagnosis Date Noted  . Pulmonary embolism ([place]) 03/04/2017  . Benign hypertension 10/05/2016  . Constipation 10/05/2016  . Gastroesophageal reflux disease 10/05/2016  . Hypercholesterolemia 10/05/2016  . Indigestion 10/05/2016  . Obstructive sleep apnea of adult 10/05/2016  . Type 2 diabetes mellitus ([place]) 10/05/2016  . Vitamin D deficiency 10/05/2016  . Coronary artery disease of native artery of native heart with stable angina pectoris ([place]) 09/27/2016  . Aortic atherosclerosis ([place]) 09/27/2016  . History of smoking 09/27/2016  . Lung nodule 06/07/2016  . Morbid obesity ([place]) 04/23/2014    Past Surgical History:  Procedure Laterality Date  . ABDOMINAL HYSTERECTOMY  1994  . CESAREAN SECTION    . CHOLECYSTECTOMY  2013  . CYST REMOVAL NECK  2013   was ingrown hair . positive for mrsa at that time prior to removal in OR  . CYSTOSCOPY WITH URETEROSCOPY AND STENT  PLACEMENT Right 05/31/2018   Procedure: CYSTOSCOPY WITH URETEROSCOPY, LASER LITHOTRIPSY AND STENT PLACEMENT;  Surgeon: Billey Co, MD;  Location: [facility] ORS;  Service: Urology;  Laterality: Right;  . ELECTROMAGNETIC NAVIGATION BROCHOSCOPY N/A 08/15/2016   Procedure: ELECTROMAGNETIC NAVIGATION BRONCHOSCOPY;  Surgeon: Flora Lipps, MD;  Location: [facility] ORS;  Service: Cardiopulmonary;  Laterality: N/A;  . ELECTROMAGNETIC NAVIGATION BROCHOSCOPY Right 11/07/2017   Procedure: ELECTROMAGNETIC NAVIGATION BRONCHOSCOPY;  Surgeon: Flora Lipps, MD;  Location: [facility] ORS;  Service: Cardiopulmonary;   Laterality: Right;  . LAPAROSCOPIC GASTRIC SLEEVE RESECTION  2015   lost 75 pounds    Prior to Admission medications   Medication Sig Start Date End Date Taking? Authorizing Provider  albuterol (PROAIR HFA) 108 (90 Base) MCG/ACT inhaler Inhale 2 Inhalers into the lungs as needed for wheezing or shortness of breath.     [provider]  atorvastatin (LIPITOR) 80 MG tablet Take 80 mg by mouth at bedtime.  08/30/16   [provider]  busPIRone (BUSPAR) 5 MG tablet Take 5 mg by mouth 2 (two) times daily.     [provider]  cholecalciferol (VITAMIN D3) 25 MCG (1000 UNIT) tablet Take 1,000 Units by mouth daily.    [provider]  diazepam (VALIUM) 5 MG tablet Take 1 tablet by mouth 1 hour prior to procedure. 04/22/20   Lloyd Huger, MD  dicyclomine (BENTYL) 20 MG tablet Take 1 tablet (20 mg total) by mouth every 6 (six) hours as needed for up to 7 days for spasms. 11/03/19 11/10/19  Lilia Pro., MD  ELDERBERRY PO Take 1 tablet by mouth daily.    [provider]  FLOVENT HFA 220 MCG/ACT inhaler Inhale 2 puffs into the lungs daily.  12/11/16   [provider]  HYDROcodone-acetaminophen (NORCO/VICODIN) 5-325 MG tablet Take 1 tablet by mouth every 6 (six) hours as needed for moderate pain. 04/27/20   Lloyd Huger, MD  hydrOXYzine (VISTARIL) 25 MG capsule Take 50 mg by mouth 2 (two) times daily.     [provider]  lansoprazole (PREVACID) 15 MG capsule Take 15 mg by mouth at bedtime.     [provider]  lisinopril (PRINIVIL,ZESTRIL) 5 MG tablet Take 5 mg by mouth daily. 12/26/17   [provider]  metFORMIN (GLUCOPHAGE-XR) 500 MG 24 hr tablet Take 1,000 mg by mouth 2 (two) times daily.  03/23/16   [provider]  [facility] VERIO test strip TEST ONCE D 09/21/18   Schuyler Amor, MD  predniSONE (DELTASONE) 10 MG tablet Take 1 tablet (10 mg total) by mouth daily. 12/07/19   Aneka Fagerstrom, Charline Bills, PA-C   sertraline (ZOLOFT) 100 MG tablet Take 100 mg by mouth daily. 12/03/17   [provider]  traMADol (ULTRAM) 50 MG tablet Take 1 tablet (50 mg total) by mouth every 6 (six) hours as needed. 12/07/19   Dason Mosley, Charline Bills, PA-C    Allergies Ibuprofen, Levaquin [levofloxacin in d5w], Sulfamethoxazole-trimethoprim, and Nitroglycerin  Family History  Problem Relation Age of Onset  . Stroke Father   . Ovarian cancer Maternal Grandmother   . Breast cancer Neg Hx     Social History Social History   Tobacco Use  . Smoking status: Former Smoker    Packs/day: 1.00    Years: 5.00    Pack years: 5.00    Types: Cigarettes    Quit date: 08/12/1991    Years since quitting: 28.8  . Smokeless tobacco: Never Used  Vaping Use  . Vaping  Use: Never used  Substance Use Topics  . Alcohol use: No  . Drug use: No     Review of Systems  Constitutional: No fever/chills.  Positive for reported multiple episodes of syncope.  Positive for dizziness. Eyes: No visual changes. No discharge ENT: No upper respiratory complaints. Cardiovascular: no chest pain. Respiratory: no cough. No SOB. Gastrointestinal: No abdominal pain.  No nausea, no vomiting.  No diarrhea.  No constipation. Genitourinary: Positive for dysuria. No hematuria Musculoskeletal: Negative for musculoskeletal pain. Skin: Negative for rash, abrasions, lacerations, ecchymosis. Neurological: Positive for headache but denies focal weakness or numbness.  Patient has had multiple syncopal episodes today  10 System ROS otherwise negative.  ____________________________________________   PHYSICAL EXAM:  VITAL SIGNS: ED Triage Vitals  Enc Vitals Group     BP 05/29/20 1826 (!) 154/67     Pulse Rate 05/29/20 1826 93     Resp 05/29/20 1826 16     Temp 05/29/20 1826 98.4 F (36.9 C)     Temp Source 05/29/20 1826 Oral     SpO2 05/29/20 1826 100 %     Weight 05/29/20 1827 227 lb 1.2 oz (103 kg)     Height 05/29/20 1827 5\' 5"   (1.651 m)     Head Circumference --      Peak Flow --      Pain Score 05/29/20 1827 5     Pain Loc --      Pain Edu? --      Excl. in [place]? --      Constitutional: Alert and oriented. Well appearing and in no acute distress. Eyes: Conjunctivae are normal. PERRL. EOMI. Head: Atraumatic. ENT:      Ears:       Nose: No congestion/rhinnorhea.      Mouth/Throat: Mucous membranes are moist.  Neck: No stridor.  Neck is supple full range of motion Hematological/Lymphatic/Immunilogical: No cervical lymphadenopathy. Cardiovascular: Normal rate, regular rhythm. Normal S1 and S2.  Good peripheral circulation. Respiratory: Normal respiratory effort without tachypnea or retractions. Lungs CTAB. Good air entry to the bases with no decreased or absent breath sounds. Gastrointestinal: Bowel sounds 4 quadrants. Soft and nontender to palpation. No guarding or rigidity. No palpable masses. No distention. No CVA tenderness. Musculoskeletal: Full range of motion to all extremities. No gross deformities appreciated. Neurologic:  Normal speech and language. No gross focal neurologic deficits are appreciated.  Cranial nerves II through XII grossly intact. Skin:  Skin is warm, dry and intact. No rash noted. Psychiatric: Mood and affect are normal. Speech and behavior are normal. Patient exhibits appropriate insight and judgement.   ____________________________________________   LABS (all labs ordered are listed, but only abnormal results are displayed)  Labs Reviewed  BASIC METABOLIC PANEL - Abnormal; Notable for the following components:      Result Value   Glucose, Bld 221 (*)    All other components within normal limits  CBC - Abnormal; Notable for the following components:   RBC 5.26 (*)    MCH 25.9 (*)    All other components within normal limits  URINALYSIS, COMPLETE (UACMP) WITH MICROSCOPIC - Abnormal; Notable for the following components:   Color, Urine YELLOW (*)    APPearance CLEAR (*)     Glucose, UA >=500 (*)    Hgb urine dipstick SMALL (*)    Leukocytes,Ua SMALL (*)    All other components within normal limits  CBG MONITORING, ED - Abnormal; Notable for the following components:   Glucose-Capillary 245 (*)  All other components within normal limits  SARS CORONAVIRUS 2 (TAT 6-24 HRS)  TSH  HEPATIC FUNCTION PANEL  T3, FREE  T4  TROPONIN I (HIGH SENSITIVITY)  TROPONIN I (HIGH SENSITIVITY)   ____________________________________________  EKG   ____________________________________________  RADIOLOGY I personally viewed and evaluated these images as part of my medical decision making, as well as reviewing the written report by the radiologist.  ED Provider Interpretation: I concur with radiologist finding of no acute cardiopulmonary normality about the chest.  Imaging of the head was CT scan revealed no acute intracranial abnormality  DG Chest 2 View  Result Date: 05/29/2020 CLINICAL DATA:  Syncope. EXAM: CHEST - 2 VIEW COMPARISON:  December 07, 2019 FINDINGS: The heart size and mediastinal contours are within normal limits. Both lungs are clear. The visualized skeletal structures are unremarkable. Aortic calcifications are noted. There is a stable plate like opacity in the right mid lung zone. There are degenerative changes throughout the visualized thoracic spine. IMPRESSION: No active cardiopulmonary disease. Electronically Signed   By: Constance Holster M.D.   On: 05/29/2020 20:52   CT Head Wo Contrast  Result Date: 05/29/2020 CLINICAL DATA:  65 year old female with dizziness. EXAM: CT HEAD WITHOUT CONTRAST TECHNIQUE: Contiguous axial images were obtained from the base of the skull through the vertex without intravenous contrast. COMPARISON:  Head CT dated 01/08/2010. FINDINGS: Brain: The ventricles and sulci appropriate size for patient's age. The gray-white matter discrimination is preserved. There is no acute intracranial hemorrhage. No mass effect or midline shift. No  extra-axial fluid collection. Vascular: No hyperdense vessel or unexpected calcification. Skull: Normal. Negative for fracture or focal lesion. Sinuses/Orbits: No acute finding. Other: None IMPRESSION: Unremarkable noncontrast CT of the brain. Electronically Signed   By: Anner Crete M.D.   On: 05/29/2020 20:59    ____________________________________________    PROCEDURES  Procedure(s) performed:    Procedures    Medications  0.9 %  sodium chloride infusion ( Intravenous New Bag/Given 05/29/20 2021)     ____________________________________________   INITIAL IMPRESSION / ASSESSMENT AND PLAN / ED COURSE  Pertinent labs & imaging results that were available during my care of the patient were reviewed by me and considered in my medical decision making (see chart for details).  Review of the [place] CSRS was performed in accordance of the [place] prior to dispensing any controlled drugs.           Patient presented to the emergency department with multiple syncopal episodes today.  She states that she thinks that she has a UTI as she has had dysuria starting yesterday.  There was no fevers or chills reported.  She woke up with a headache today and some mild vertigo-like symptoms.  Patient states that her primary reason for visiting the emergency department was she had bent over to pick up laundry, had a syncopal episode.  Since then she has had recurrent syncopal episodes with changes of position and is positive for orthostatic changes.  At this time work-up is still pending.  Patient has been symptomatic care and had a syncopal episode when sitting up from her CT scan.  Neurologically patient is intact on exam.  Exam was reassuring.  Differential included hypertension, UTI, sepsis, CVA, thyroid abnormality, dehydration.  Pending final results of patient's labs, imaging, patient care will be transferred to attending provider, Dr. Joni Fears, for final diagnosis and disposition.      This  chart was dictated using voice recognition software/Dragon. Despite best efforts to proofread, errors  can occur which can change the meaning. Any change was purely unintentional.    Brynda Peon 05/29/20 2134    Carrie Mew, MD 05/29/20 [phone]

## 2020-05-29 NOTE — ED Notes (Signed)
EDP Archie Balboa notified via secure chat unable to obtain IV access. Notified IV team consult placed. Pt reports dizziness and nausea. Pt A&O4.

## 2020-05-29 NOTE — ED Notes (Signed)
Pt leaving for imaging.

## 2020-05-29 NOTE — ED Notes (Signed)
VSS on monitor.

## 2020-05-29 NOTE — ED Notes (Signed)
To bedside to hang fluids. Pt adamant needs to use restroom first. Hat placed in toilet and pt educated about collection of sample.

## 2020-05-29 NOTE — ED Notes (Signed)
Pt assisted to restroom via wheelchair. Pt extremely dizzy upon standing.

## 2020-05-29 NOTE — ED Notes (Signed)
EKG to EDP Goodman.  

## 2020-05-29 NOTE — ED Notes (Signed)
IV team at bedside 

## 2020-05-29 NOTE — ED Notes (Signed)
EMS attempted for IVx3 per pt. Kory RN attempted x2 for IV. Pt reports she is a hard stick usually.

## 2020-05-29 NOTE — ED Triage Notes (Signed)
Pt to ED via [facility] from home for multiple syncopal episodes today at home. Per EMS pt positive for orthostatic changes. Every time EMS would attempt to stand pt up she would have syncopal episode. EMS states that pts CBG around 350, pt also has some left leg pain. Pt states that the pain started this morning and is 5/10. Pt has hx/o DVT in left leg. Pt is in NAD at this time, A & O x 4

## 2020-05-29 NOTE — ED Notes (Signed)
Pt reports L leg aching since last night. Bilateral swelling to legs; nonpitting. Pulse 1+ at dorsalis pedis bilaterally. Feet warm but toes cool bilaterally. R shin skin red in small area; L shin darker red in larger area. Pt reports this is chronic and was told it is due to dec blood flow. Cap refill<3 sec.

## 2020-05-30 ENCOUNTER — Emergency Department: Payer: Managed Care, Other (non HMO)

## 2020-05-30 LAB — SARS CORONAVIRUS 2 (TAT 6-24 HRS): SARS Coronavirus 2: NEGATIVE

## 2020-05-30 MED ORDER — MECLIZINE HCL 25 MG PO TABS
25.0000 mg | ORAL_TABLET | Freq: Once | ORAL | Status: AC
  Administered 2020-05-30: 25 mg via ORAL
  Filled 2020-05-30: qty 1

## 2020-05-30 MED ORDER — LACTATED RINGERS IV BOLUS
500.0000 mL | Freq: Once | INTRAVENOUS | Status: AC
  Administered 2020-05-30: 500 mL via INTRAVENOUS

## 2020-05-30 MED ORDER — MECLIZINE HCL 25 MG PO TABS: 25.0000 mg | ORAL_TABLET | Freq: Three times a day (TID) | ORAL | 0 refills | Status: DC | PRN

## 2020-05-30 NOTE — ED Provider Notes (Signed)
Accepted care of this patient from Dr. Joni Fears at 11:30 PM.  Attempt to ambulate patient who reports she feels improved but still dizzy Patient reports that previously she felt like she was going to pass out with standing but now just slightly off balance. Patient ambulated with some help, had some moments of imbalance. I reviewed her labs and imaging with no acute findings. Will give another 500cc bolus, meclizine and get an MRI of the brain. Patient is otherwise neuro intact.     _________________________ 4:51 AM on 05/30/2020 -----------------------------------------  Patient's MRI negative for CVA. Patient feels markedly improved with full resolution of symptoms after meclizine and repeat bolus. Ambulated with no assistance and with no difficulty, steady gait, neuro intact. Will discharge home on meclizine and close follow-up with primary care doctor.  Discussed my standard return precautions.   I have personally reviewed the images performed during this visit and I agree with the Radiologist's read.   Interpretation by Radiologist:  DG Chest 2 View  Result Date: 05/29/2020 CLINICAL DATA:  Syncope. EXAM: CHEST - 2 VIEW COMPARISON:  December 07, 2019 FINDINGS: The heart size and mediastinal contours are within normal limits. Both lungs are clear. The visualized skeletal structures are unremarkable. Aortic calcifications are noted. There is a stable plate like opacity in the right mid lung zone. There are degenerative changes throughout the visualized thoracic spine. IMPRESSION: No active cardiopulmonary disease. Electronically Signed   By: Constance Holster M.D.   On: 05/29/2020 20:52   CT Head Wo Contrast  Result Date: 05/29/2020 CLINICAL DATA:  65 year old female with dizziness. EXAM: CT HEAD WITHOUT CONTRAST TECHNIQUE: Contiguous axial images were obtained from the base of the skull through the vertex without intravenous contrast. COMPARISON:  Head CT dated 01/08/2010. FINDINGS: Brain: The  ventricles and sulci appropriate size for patient's age. The gray-white matter discrimination is preserved. There is no acute intracranial hemorrhage. No mass effect or midline shift. No extra-axial fluid collection. Vascular: No hyperdense vessel or unexpected calcification. Skull: Normal. Negative for fracture or focal lesion. Sinuses/Orbits: No acute finding. Other: None IMPRESSION: Unremarkable noncontrast CT of the brain. Electronically Signed   By: Anner Crete M.D.   On: 05/29/2020 20:59   MR BRAIN WO CONTRAST  Result Date: 05/30/2020 CLINICAL DATA:  Initial evaluation for acute vertigo. EXAM: MRI HEAD WITHOUT CONTRAST TECHNIQUE: Multiplanar, multiecho pulse sequences of the brain and surrounding structures were obtained without intravenous contrast. COMPARISON:  Prior head CT from 05/29/2020. FINDINGS: Brain: Cerebral volume within normal limits for age. Patchy T2/FLAIR hyperintensity within the periventricular and deep white matter both cerebral hemispheres as well as the pons, most consistent with chronic small vessel ischemic disease, mild in nature. No abnormal foci of restricted diffusion to suggest acute or subacute ischemia. Gray-white matter differentiation maintained. No encephalomalacia to suggest chronic cortical infarction. No evidence for acute or chronic intracranial hemorrhage. No mass lesion, midline shift or mass effect. No hydrocephalus or extra-axial fluid collection. Pituitary gland and suprasellar region within normal limits. Midline structures intact. Vascular: Major intracranial vascular flow voids are maintained. Skull and upper cervical spine: Craniocervical junction within normal limits. Bone marrow signal intensity normal. No scalp soft tissue abnormality. Sinuses/Orbits: Globes and orbital soft tissues within normal limits. Paranasal sinuses are clear. No mastoid effusion. Inner ear structures grossly normal. Other: None. IMPRESSION: 1. No acute intracranial abnormality. 2.  Mild chronic microvascular ischemic disease for age. Electronically Signed   By: Jeannine Boga M.D.   On: 05/30/2020 04:07  Alfred Levins, [place], MD 05/30/20 [phone]

## 2020-05-30 NOTE — Discharge Instructions (Addendum)
Take meclizine as needed for dizziness and follow up with your doctor in 3 days. Return to the ER if the dizziness gets worse, if you have difficulty with your speech, difficulty with ambulation, unilateral weakness or numbness, severe headache, chest pain or shortness of breath, fever.

## 2020-05-30 NOTE — ED Notes (Signed)
Pt to MRI

## 2020-05-30 NOTE — ED Notes (Signed)
Pt returned from MRI. Po fluids and crackers provided.

## 2020-05-31 LAB — URINE CULTURE

## 2020-06-01 LAB — T3, FREE: T3, Free: 3 pg/mL (ref 2.0–4.4)

## 2020-06-01 LAB — T4: T4, Total: 6.9 ug/dL (ref 4.5–12.0)

## 2020-07-29 ENCOUNTER — Ambulatory Visit
Admission: EM | Admit: 2020-07-29 | Disposition: A | Discharge status: Home / Self Care | Payer: Managed Care, Other (non HMO)

## 2020-08-26 ENCOUNTER — Telehealth: Payer: Self-pay | Admitting: Oncology

## 2020-08-26 NOTE — Telephone Encounter (Signed)
Left VM that patients CT scan for 4/11 had to be canceled due to insurance authorization. Results appt scheduled with Sonia Baller was also canceled until scans are completed.

## 2020-08-30 ENCOUNTER — Ambulatory Visit: Payer: Managed Care, Other (non HMO)

## 2020-08-30 ENCOUNTER — Inpatient Hospital Stay: Payer: Managed Care, Other (non HMO) | Admitting: Oncology

## 2020-09-10 NOTE — Progress Notes (Signed)
[place]  [phone] [phone]  ID: Andrea Pollard OB: Jan 28, 1956  MR#: [id number]  [id number]  Patient Care Team: Ellamae Sia, MD as PCP - General (Internal Medicine) Minna Merritts, MD as Consulting Physician (Cardiology) Telford Nab, RN as Registered Nurse   CHIEF COMPLAINT: Mass of upper lobe of right lung, thyroid nodule.  INTERVAL HISTORY: Patient returns to clinic today for routine 5-month evaluation.  CT scan was not covered by insurance therefore not performed.  She currently feels well and is asymptomatic. She has no neurologic complaints. She denies any recent fevers or illnesses. She has a good appetite and denies weight loss.  She denies any chest pain, shortness of breath, cough, or hemoptysis.  She denies any nausea, vomiting, constipation, or diarrhea. She has no urinary complaints.  Patient offers no specific complaints today.  REVIEW OF SYSTEMS:   Review of Systems  Constitutional: Negative.  Negative for fever, malaise/fatigue and weight loss.  Respiratory: Negative.  Negative for cough, hemoptysis and shortness of breath.   Cardiovascular: Negative.  Negative for chest pain and leg swelling.  Gastrointestinal: Negative.  Negative for abdominal pain.  Genitourinary: Negative.  Negative for dysuria.  Musculoskeletal: Negative.  Negative for back pain and neck pain.  Skin: Negative.  Negative for rash.  Neurological: Negative.  Negative for sensory change, focal weakness and weakness.  Psychiatric/Behavioral: Negative.  The patient is not nervous/anxious and does not have insomnia.     As per HPI. Otherwise, a complete review of systems is negative.  PAST MEDICAL HISTORY: Past Medical History:  Diagnosis Date  . Anxiety   . Asthma   . Complication of anesthesia   . COPD (chronic obstructive pulmonary disease) (HCC)    diagnosed from pulmonary function tests  . Depression   . Diabetes mellitus  without complication ([place])   . DVT (deep venous thrombosis) ([place])   . Dvt femoral (deep venous thrombosis) ([place]) 2018   in lung also.  treated with xarelto for 6 months  . GERD (gastroesophageal reflux disease)   . 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 History of hiatal hernia   . History of kidney stones 2019  . History of methicillin resistant staphylococcus aureus (MRSA) 2013   in neck due to ingrown hair. had surgery to drain cyst  . History of palpitations 2019   due to a panic attack  . Hypertension   . Lung nodule 2019   being followed by dr. Grayland Ormond, dr Genevive Bi and dr. Mortimer Fries  . Pneumonia 04/2016  . PONV (postoperative nausea and vomiting)    NAUSEATED  . Pulmonary emboli ([place]) 2018  . Sleep apnea    CPAP  . Vertigo     PAST SURGICAL HISTORY: Past Surgical History:  Procedure Laterality Date  . ABDOMINAL HYSTERECTOMY  1994  . CESAREAN SECTION    . CHOLECYSTECTOMY  2013  . CYST REMOVAL NECK  2013   was ingrown hair . positive for mrsa at that time prior to removal in OR  . CYSTOSCOPY WITH URETEROSCOPY AND STENT PLACEMENT Right 05/31/2018   Procedure: CYSTOSCOPY WITH URETEROSCOPY, LASER LITHOTRIPSY AND STENT PLACEMENT;  Surgeon: Billey Co, MD;  Location: [facility] ORS;  Service: Urology;  Laterality: Right;  . ELECTROMAGNETIC NAVIGATION BROCHOSCOPY N/A 08/15/2016   Procedure: ELECTROMAGNETIC NAVIGATION BRONCHOSCOPY;  Surgeon: Flora Lipps, MD;  Location: [facility] ORS;  Service: Cardiopulmonary;  Laterality: N/A;  . ELECTROMAGNETIC NAVIGATION BROCHOSCOPY Right 11/07/2017   Procedure: ELECTROMAGNETIC NAVIGATION BRONCHOSCOPY;  Surgeon: Flora Lipps, MD;  Location: [facility] ORS;  Service: Cardiopulmonary;  Laterality: Right;  . LAPAROSCOPIC GASTRIC SLEEVE RESECTION  2015   lost 75 pounds    FAMILY HISTORY: Family History  Problem Relation Age of Onset  . Stroke Father   . Ovarian cancer Maternal Grandmother   . Breast cancer Neg Hx     ADVANCED DIRECTIVES (Y/N):  N  HEALTH  MAINTENANCE: Social History   Tobacco Use  . Smoking status: Former Smoker    Packs/day: 1.00    Years: 5.00    Pack years: 5.00    Types: Cigarettes    Quit date: 08/12/1991    Years since quitting: 29.1  . Smokeless tobacco: Never Used  Vaping Use  . Vaping Use: Never used  Substance Use Topics  . Alcohol use: No  . Drug use: No     Colonoscopy:  PAP:  Bone density:  Lipid panel:  Allergies  Allergen Reactions  . Ibuprofen Other (See Comments)    Cannot Use due to Gastric SLEEVE SURGERY  . Levaquin [Levofloxacin In D5w] Hives  . Sulfamethoxazole-Trimethoprim Hives  . Nitroglycerin Other (See Comments)    Severe Hypotension    Current Outpatient Medications  Medication Sig Dispense Refill  . albuterol (VENTOLIN HFA) 108 (90 Base) MCG/ACT inhaler Inhale 2 Inhalers into the lungs as needed for wheezing or shortness of breath.     Marland Kitchen atorvastatin (LIPITOR) 80 MG tablet Take 80 mg by mouth at bedtime.   2  . busPIRone (BUSPAR) 5 MG tablet Take 5 mg by mouth 2 (two) times daily.     . cholecalciferol (VITAMIN D3) 25 MCG (1000 UNIT) tablet Take 1,000 Units by mouth daily.    . diazepam (VALIUM) 5 MG tablet Take 1 tablet by mouth 1 hour prior to procedure. 2 tablet 0  . ELDERBERRY PO Take 1 tablet by mouth daily.    Marland Kitchen FLOVENT HFA 220 MCG/ACT inhaler Inhale 2 puffs into the lungs daily.   5  . HYDROcodone-acetaminophen (NORCO/VICODIN) 5-325 MG tablet Take 1 tablet by mouth every 6 (six) hours as needed for moderate pain. 30 tablet 0  . hydrOXYzine (VISTARIL) 25 MG capsule Take 50 mg by mouth 2 (two) times daily.    . lansoprazole (PREVACID) 15 MG capsule Take 15 mg by mouth at bedtime.     Marland Kitchen lisinopril (PRINIVIL,ZESTRIL) 5 MG tablet Take 5 mg by mouth daily.  3  . meclizine (ANTIVERT) 25 MG tablet Take 1 tablet (25 mg total) by mouth 3 (three) times daily as needed for dizziness. 30 tablet 0  . metFORMIN (GLUCOPHAGE-XR) 500 MG 24 hr tablet Take 1,000 mg by mouth 2 (two) times  daily.   2  . ONETOUCH VERIO test strip TEST ONCE D 100 each 3  . OZEMPIC, 0.25 OR 0.5 MG/DOSE, 2 MG/1.5ML SOPN     . predniSONE (DELTASONE) 10 MG tablet Take 1 tablet (10 mg total) by mouth daily. 42 tablet 0  . sertraline (ZOLOFT) 100 MG tablet Take 100 mg by mouth daily.  3  . traMADol (ULTRAM) 50 MG tablet Take 1 tablet (50 mg total) by mouth every 6 (six) hours as needed. 12 tablet 0  . dicyclomine (BENTYL) 20 MG tablet Take 1 tablet (20 mg total) by mouth every 6 (six) hours as needed for up to 7 days for spasms. 28 tablet 0   No current facility-administered medications for this visit.    OBJECTIVE: Vitals:   09/15/20 0956  BP: 132/87  Pulse: 88  Resp: 20  Temp: (!) 97.5 F (36.4 C)  SpO2: 97%     Body mass index is 35.56 kg/m.    ECOG FS:0 - Asymptomatic   General: Well-developed, well-nourished, no acute distress. Eyes: Pink conjunctiva, anicteric sclera. HEENT: Normocephalic, moist mucous membranes. Lungs: No audible wheezing or coughing. Heart: Regular rate and rhythm. Abdomen: Soft, nontender, no obvious distention. Musculoskeletal: No edema, cyanosis, or clubbing. Neuro: Alert, answering all questions appropriately. Cranial nerves grossly intact. Skin: No rashes or petechiae noted. Psych: Normal affect.   LAB RESULTS:  Lab Results  Component Value Date   NA 143 05/29/2020   K 3.9 05/29/2020   CL 105 05/29/2020   CO2 27 05/29/2020   GLUCOSE 221 (H) 05/29/2020   BUN 17 05/29/2020   CREATININE 0.69 05/29/2020   CALCIUM 9.8 05/29/2020   PROT 7.2 05/29/2020   ALBUMIN 3.9 05/29/2020   AST 40 05/29/2020   ALT 33 05/29/2020   ALKPHOS 80 05/29/2020   BILITOT 0.5 05/29/2020   GFRNONAA >60 05/29/2020   GFRAA >60 12/07/2019    Lab Results  Component Value Date   WBC 8.3 05/29/2020   NEUTROABS 4.1 09/21/2018   HGB 13.6 05/29/2020   HCT 42.4 05/29/2020   MCV 80.6 05/29/2020   PLT 165 05/29/2020     STUDIES: No results found.  ASSESSMENT: Mass of  upper lobe of right lung  PLAN:    1. Mass of upper lobe of right lung: Patient underwent biopsy on November 07, 2017 that was negative for malignancy.  CT scan results from February 26, 2020 reviewed independently with persistent yet unchanged 1.4 cm nodule in the right upper lobe.  Patient also has multiple other pulmonary nodules that are essentially unchanged.  Recent CT scan at [facility] was essentially unchanged.  No intervention is needed at this time.  No further imaging is necessary unless patient is symptomatic.  She wishes to continue follow-up and will return to clinic in 1 year for routine evaluation.   2. Bilateral PE/left leg DVT: Resolved.  Patient has now completed anticoagulation.  Diagnosed by CT scan on November 28, 2016. Patient has no obvious transient risk factors. Hypercoagulable workup was negative except for an elevated DRVVT which can be attributed to the anticoagulation she is taking at that time.  Recent work-up at [facility] was also reported as negative. 3.  Back pain: Chronic and unchanged. 4.  Thyroid nodule: Likely benign.  FNA on April 26, 2020 was nondiagnostic.  Patient expressed understanding and was in agreement with this plan. She also understands that She can call clinic at any time with any questions, concerns, or complaints.   Cancer Staging No matching staging information was found for the patient.  Lloyd Huger, MD   09/16/2020 6:27 AM

## 2020-09-15 ENCOUNTER — Inpatient Hospital Stay: Payer: Managed Care, Other (non HMO) | Attending: Oncology | Admitting: Oncology

## 2020-09-15 ENCOUNTER — Encounter: Payer: Self-pay | Admitting: Oncology

## 2020-09-15 VITALS — BP 132/87 | HR 88 | Temp 97.5°F | Resp 20 | Wt 213.7 lb

## 2020-09-15 DIAGNOSIS — M549 Dorsalgia, unspecified: Secondary | ICD-10-CM | POA: Insufficient documentation

## 2020-09-15 DIAGNOSIS — E041 Nontoxic single thyroid nodule: Secondary | ICD-10-CM | POA: Insufficient documentation

## 2020-09-15 DIAGNOSIS — Z79899 Other long term (current) drug therapy: Secondary | ICD-10-CM | POA: Diagnosis not present

## 2020-09-15 DIAGNOSIS — R918 Other nonspecific abnormal finding of lung field: Secondary | ICD-10-CM

## 2020-09-15 DIAGNOSIS — G8929 Other chronic pain: Secondary | ICD-10-CM | POA: Insufficient documentation

## 2020-09-15 NOTE — Progress Notes (Signed)
Patient here today for follow up regarding lung mass.

## 2020-09-27 ENCOUNTER — Other Ambulatory Visit: Payer: Self-pay | Admitting: Physician Assistant

## 2020-09-27 DIAGNOSIS — Z1231 Encounter for screening mammogram for malignant neoplasm of breast: Secondary | ICD-10-CM

## 2020-10-05 ENCOUNTER — Encounter: Payer: Self-pay | Admitting: Internal Medicine

## 2021-01-18 ENCOUNTER — Encounter: Payer: Self-pay | Admitting: Oncology

## 2021-01-18 ENCOUNTER — Inpatient Hospital Stay: Payer: Managed Care, Other (non HMO) | Attending: Oncology | Admitting: Oncology

## 2021-01-18 ENCOUNTER — Other Ambulatory Visit: Payer: Self-pay

## 2021-01-18 DIAGNOSIS — U071 COVID-19: Secondary | ICD-10-CM | POA: Diagnosis not present

## 2021-01-18 DIAGNOSIS — R918 Other nonspecific abnormal finding of lung field: Secondary | ICD-10-CM | POA: Diagnosis not present

## 2021-01-18 MED ORDER — NIRMATRELVIR/RITONAVIR (PAXLOVID)TABLET: 3.0000 | ORAL_TABLET | Freq: Two times a day (BID) | ORAL | 0 refills | Status: AC

## 2021-01-18 MED ORDER — ALBUTEROL SULFATE HFA 108 (90 BASE) MCG/ACT IN AERS: 2.0000 | INHALATION_SPRAY | Freq: Four times a day (QID) | RESPIRATORY_TRACT | 2 refills | Status: DC | PRN

## 2021-01-18 NOTE — Progress Notes (Signed)
Virtual Visit via Telephone Note  I connected with Andrea Pollard on 01/18/21 at  1:35 PM EDT by telephone and verified that I am speaking with the correct person using two identifiers.  Location: Patient: Home Provider: Clinic    I discussed the limitations, risks, security and privacy concerns of performing an evaluation and management service by telephone and the availability of in person appointments. I also discussed with the patient that there may be a patient responsible charge related to this service. The patient expressed understanding and agreed to proceed.  History of Present Illness: Andrea Pollard is a 65 year old female with past medical history significant for CAD, hypertension, aortic atherosclerosis, pulmonary embolus, sleep apnea, GERD, type 2 diabetes, morbid obesity, high cholesterol and vitamin D deficiency who is followed by Dr. Grayland Ormond for a mass of her right upper lobe. She is s/p biopsy on 11/07/2017 or [facility] which was negative for malignancy.  Follow-up CT scan from February 26, 2020 showed an unchanged 1.4 cm nodule in the right upper lobe.  Currently she is under active surveillance.  She also has history of a bilateral PE left leg DVT and is currently not on anticoagulation.  Hypercoagulable work-up was negative except for an elevated DRVVT which can be attributed to being on anticoagulation.  Patient called clinic with concerns of recent COVID-19 positive test result.  She developed symptoms yesterday with a headache and has now developed a cough as of this morning.  Her daughter and grandson both tested positive for COVID-19 on 8/19 and 8/20.  Daughter states that although she lives with her mom she did quarantine herself and son.  Andrea Pollard is vaccinated x3.  She recently had antibodies drawn from [place] which showed antibodies > 4000.  She has questions regarding oral antivirals versus monoclonal antibody infusion.  Observations/Objective: Review of Systems   Constitutional:  Positive for malaise/fatigue.  HENT:  Positive for congestion.   Respiratory:  Positive for cough and sputum production.    Physical Exam Neurological:     Mental Status: She is alert and oriented to person, place, and time.   Assessment and Plan: COVID-19 positive- Patient is vaccinated x3.  Given symptom onset was yesterday she is a great candidate for oral antiviral.  She recently had a metabolic panel drawn and has normal kidney function and GFR is greater than 60.  Discussed full dose Paxlovid with patient and daughter.  Reviewed medications with patient and daughter.  She is currently not on narcotics that were listed on her medication list.  She is also not taking prednisone.  I have asked her to hold her atorvastatin while she is taking her paxlovid.  She is also asking for a refill on her albuterol inhaler.  Follow Up Instructions:  Start taking Paxlovid today.  Take for a total of 5 days.  Stop atorvastatin while taking Paxlovid.  May resume 24 hours after your last dose.  Please call with any concerns or questions.    I discussed the assessment and treatment plan with the patient. The patient was provided an opportunity to ask questions and all were answered. The patient agreed with the plan and demonstrated an understanding of the instructions.   The patient was advised to call back or seek an in-person evaluation if the symptoms worsen or if the condition fails to improve as anticipated.  I provided 20 minutes of non-face-to-face time during this encounter.   Jacquelin Hawking, NP

## 2021-01-18 NOTE — Telephone Encounter (Signed)
I can call her and do a virtual visit. I will call her and get this set up.  Faythe Casa, NP 01/18/2021 12:39 PM

## 2021-02-08 ENCOUNTER — Telehealth: Payer: Self-pay | Admitting: *Deleted

## 2021-02-08 DIAGNOSIS — R911 Solitary pulmonary nodule: Secondary | ICD-10-CM

## 2021-02-08 DIAGNOSIS — R059 Cough, unspecified: Secondary | ICD-10-CM

## 2021-02-08 NOTE — Telephone Encounter (Addendum)
Patient called asking for a return call to discuss "some issues" she is having. She specifically asked for [facility], NP, or Dr Grayland Ormond to call her

## 2021-02-09 NOTE — Telephone Encounter (Signed)
Attempted to call pt back. No answer. Message left for pt to return call.

## 2021-02-10 NOTE — Telephone Encounter (Signed)
Spoke with patient who is having persistent cough. Pt is concerned and wants to know further recommendations. Per Sonia Baller, will order CT chest to follow up on her cough and history of lung nodules. Last chest CT was 02/2020. Pt made aware and will be called with appts once scheduled. Pt verbalized understanding.

## 2021-02-10 NOTE — Addendum Note (Signed)
Addended by: Telford Nab on: 02/10/2021 09:00 AM   Modules accepted: Orders

## 2021-02-16 ENCOUNTER — Ambulatory Visit: Payer: Managed Care, Other (non HMO)

## 2021-02-22 ENCOUNTER — Ambulatory Visit
Admission: RE | Admit: 2021-02-22 | Discharge: 2021-02-22 | Disposition: A | Discharge status: Home / Self Care | Payer: Managed Care, Other (non HMO) | Source: Ambulatory Visit | Attending: Oncology | Admitting: Oncology

## 2021-02-22 ENCOUNTER — Inpatient Hospital Stay: Payer: Managed Care, Other (non HMO) | Attending: Oncology | Admitting: Oncology

## 2021-02-22 ENCOUNTER — Other Ambulatory Visit: Payer: Self-pay

## 2021-02-22 ENCOUNTER — Encounter: Payer: Self-pay | Admitting: Oncology

## 2021-02-22 VITALS — BP 113/74 | Temp 98.2°F | Wt 204.5 lb

## 2021-02-22 DIAGNOSIS — R059 Cough, unspecified: Secondary | ICD-10-CM | POA: Diagnosis present

## 2021-02-22 DIAGNOSIS — R911 Solitary pulmonary nodule: Secondary | ICD-10-CM | POA: Insufficient documentation

## 2021-02-22 LAB — POCT I-STAT CREATININE: Creatinine, Ser: 0.6 mg/dL (ref 0.44–1.00)

## 2021-02-22 MED ORDER — IOHEXOL 350 MG/ML SOLN
75.0000 mL | Freq: Once | INTRAVENOUS | Status: AC | PRN
  Administered 2021-02-22: 75 mL via INTRAVENOUS

## 2021-02-22 NOTE — Progress Notes (Signed)
Pt has dry cough mostly when stitting

## 2021-02-22 NOTE — Progress Notes (Signed)
[place]  [phone]) [phone] Fax:(336) [phone]  ID: [name] OB: 18-Sep-1955  MR#: [id number]  UXN#:[id number]  Patient Care Team: Ellamae Sia, MD (Inactive) as PCP - General (Internal Medicine) Minna Merritts, MD as Consulting Physician (Cardiology) Telford Nab, RN as Registered Nurse   CHIEF COMPLAINT: Mass of upper lobe of right lung, thyroid nodule.  INTERVAL HISTORY: Andrea Pollard is a 65 year old female with past medical history significant for CAD, hypertension, aortic atherosclerosis, pulmonary embolus, sleep apnea, GERD, type 2 diabetes, morbid obesity, high cholesterol and vitamin D deficiency who is followed by Dr. Grayland Ormond for a mass of her right upper lobe. She is s/p biopsy on 11/07/2017 or RUL which was negative for malignancy.  Follow-up CT scan from February 26, 2020 showed an unchanged 1.4 cm nodule in the right upper lobe.  Currently she is under active surveillance.  She also has history of a bilateral PE left leg DVT and is currently not on anticoagulation.  Hypercoagulable work-up was negative except for an elevated DRVVT which can be attributed to being on anticoagulation.   She presents back to clinic today for follow-up and to review her most recent CT scan.  She tested positive for COVID back in late August early September.  She was prescribed paxlovid which she tolerated well.  All symptoms improved.  Today she reports anxiety to review her CT scan.  Reports starting to have a dry cough since having COVID.  Denies any fevers or recent infection.  Denies sputum production.  REVIEW OF SYSTEMS:   Review of Systems  Constitutional: Negative.  Negative for chills, fever, malaise/fatigue and weight loss.  HENT:  Negative for congestion, ear pain and tinnitus.   Eyes: Negative.  Negative for blurred vision and double vision.  Respiratory:  Positive for cough. Negative for sputum production and shortness of breath.    Cardiovascular: Negative.  Negative for chest pain, palpitations and leg swelling.  Gastrointestinal: Negative.  Negative for abdominal pain, constipation, diarrhea, nausea and vomiting.  Genitourinary:  Negative for dysuria, frequency and urgency.  Musculoskeletal:  Negative for back pain and falls.  Skin: Negative.  Negative for rash.  Neurological: Negative.  Negative for weakness and headaches.  Endo/Heme/Allergies: Negative.  Does not bruise/bleed easily.  Psychiatric/Behavioral: Negative.  Negative for depression. The patient is not nervous/anxious and does not have insomnia.    As per HPI. Otherwise, a complete review of systems is negative.  PAST MEDICAL HISTORY: Past Medical History:  Diagnosis Date   Anxiety    Asthma    Complication of anesthesia    COPD (chronic obstructive pulmonary disease) ([place])    diagnosed from pulmonary function tests   Depression    Diabetes mellitus without complication ([place])    DVT (deep venous thrombosis) ([place])    Dvt femoral (deep venous thrombosis) ([place]) 2018   in lung also.  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 had surgery to drain cyst   History of palpitations 2019   due to a panic attack   Hypertension    Lung nodule 2019   being followed by dr. Grayland Ormond, dr Genevive Bi and dr. Mortimer Fries   Pneumonia 04/2016   PONV (postoperative nausea and vomiting)  NAUSEATED   Pulmonary emboli ([place]) 2018   Sleep apnea    CPAP   Vertigo     PAST SURGICAL HISTORY: Past Surgical History:  Procedure Laterality Date   ABDOMINAL HYSTERECTOMY  1994   CESAREAN SECTION     CHOLECYSTECTOMY  2013   CYST REMOVAL NECK  2013   was ingrown hair . positive for mrsa at that time prior to removal in [place]  URETEROSCOPY AND STENT PLACEMENT Right 05/31/2018   Procedure: CYSTOSCOPY WITH URETEROSCOPY, LASER LITHOTRIPSY AND STENT PLACEMENT;  Surgeon: Billey Co, MD;  Location: [facility] OR[facility];  Service: Urology;  Laterality: Right;   ELECTROMAGNETIC NAVIGATION BROCHOSCOPY N/A 08/15/2016   Procedure: ELECTROMAGNETIC NAVIGATION BRONCHOSCOPY;  Surgeon: Flora Lipps, MD;  Location: [facility] OR[facility];  Service: Cardiopulmonary;  Laterality: N/A;   ELECTROMAGNETIC NAVIGATION BROCHOSCOPY Right 11/07/2017   Procedure: ELECTROMAGNETIC NAVIGATION BRONCHOSCOPY;  Surgeon: Flora Lipps, MD;  Location: [facility] OR[facility];  Service: Cardiopulmonary;  Laterality: Right;   LAPAROSCOPIC GASTRIC SLEEVE RESECTION  2015   lost 75 pounds    FAMILY HISTORY: Family History  Problem Relation Age of Onset   Stroke Father    Ovarian cancer Maternal Grandmother    Breast cancer Neg Hx     ADVANCED DIRECTIVES (Y/N):  N  HEALTH MAINTENANCE: Social History   Tobacco Use   Smoking status: Former    Packs/day: 1.00    Years: 5.00    Pack years: 5.00    Types: Cigarettes    Quit date: 08/12/1991    Years since quitting: 29.5   Smokeless tobacco: Never  Vaping Use   Vaping Use: Never used  Substance Use Topics   Alcohol use: No   Drug use: No     Colonoscopy:  PAP:  Bone density:  Lipid panel:  Allergies  Allergen Reactions   Ibuprofen Other (See Comments)    Cannot Use due to Gastric SLEEVE SURGERY   Levaquin [Levofloxacin In D5w] Hives   Sulfamethoxazole-Trimethoprim Hives   Nitroglycerin Other (See Comments)    Severe Hypotension    Current Outpatient Medications  Medication Sig Dispense Refill   albuterol (VENTOLIN HFA) 108 (90 Base) MCG/ACT inhaler Inhale 2 Inhalers into the lungs as needed for wheezing or shortness of breath.      albuterol (VENTOLIN HFA) 108 (90 Base) MCG/ACT inhaler Inhale 2 puffs into the lungs every 6 (six) hours as needed for wheezing or shortness of breath. 8 g 2   atorvastatin (LIPITOR) 80 MG  tablet Take 80 mg by mouth at bedtime.   2   busPIRone (BUSPAR) 5 MG tablet Take 5 mg by mouth 2 (two) times daily.      cholecalciferol (VITAMIN D3) 25 MCG (1000 UNIT) tablet Take 1,000 Units by mouth daily.     dicyclomine (BENTYL) 20 MG tablet Take 1 tablet (20 mg total) by mouth every 6 (six) hours as needed for up to 7 days for spasms. 28 tablet 0   ELDERBERRY PO Take 1 tablet by mouth daily.     FLOVENT HFA 220 MCG/ACT inhaler Inhale 2 puffs into the lungs daily.   5   hydrOXYzine (VISTARIL) 25 MG capsule Take 50 mg by mouth 2 (two) times daily.     lansoprazole (PREVACID) 15 MG capsule Take 15 mg by mouth at bedtime.      lisinopril (PRINIVIL,ZESTRIL) 5 MG tablet Take 5 mg by mouth daily.  3   meclizine (ANTIVERT) 25 MG tablet Take 1 tablet (25 mg total) by  mouth 3 (three) times daily as needed for dizziness. 30 tablet 0   metFORMIN (GLUCOPHAGE-XR) 500 MG 24 hr tablet Take 1,000 mg by mouth 2 (two) times daily.   2   ONETOUCH VERIO test strip TEST ONCE D 100 each 3   OZEMPIC, 0.25 OR 0.5 MG/DOSE, 2 MG/1.5ML SOPN      sertraline (ZOLOFT) 100 MG tablet Take 100 mg by mouth daily.  3   No current facility-administered medications for this visit.    OBJECTIVE: There were no vitals filed for this visit.    There is no height or weight on file to calculate BMI.    ECOG FS:0 - Asymptomatic   Physical Exam Constitutional:      Appearance: Normal appearance. She is obese.  HENT:     Head: Normocephalic and atraumatic.  Eyes:     Pupils: Pupils are equal, round, and reactive to light.  Cardiovascular:     Rate and Rhythm: Normal rate and regular rhythm.     Heart sounds: Normal heart sounds. No murmur heard. Pulmonary:     Effort: Pulmonary effort is normal.     Breath sounds: Normal breath sounds. No wheezing.  Abdominal:     General: Bowel sounds are normal. There is no distension.     Palpations: Abdomen is soft.     Tenderness: There is no abdominal tenderness.   Musculoskeletal:        General: Normal range of motion.     Cervical back: Normal range of motion.  Skin:    General: Skin is warm and dry.     Findings: No rash.  Neurological:     Mental Status: She is alert and oriented to person, place, and time.  Psychiatric:        Judgment: Judgment normal.     LAB RESULTS:  Lab Results  Component Value Date   NA 143 05/29/2020   K 3.9 05/29/2020   CL 105 05/29/2020   CO2 27 05/29/2020   GLUCOSE 221 (H) 05/29/2020   BUN 17 05/29/2020   CREATININE 0.60 02/22/2021   CALCIUM 9.8 05/29/2020   PROT 7.2 05/29/2020   ALBUMIN 3.9 05/29/2020   AST 40 05/29/2020   ALT 33 05/29/2020   ALKPHOS 80 05/29/2020   BILITOT 0.5 05/29/2020   GFRNONAA >60 05/29/2020   GFRAA >60 12/07/2019    Lab Results  Component Value Date   WBC 8.3 05/29/2020   NEUTROABS 4.1 09/21/2018   HGB 13.6 05/29/2020   HCT 42.4 05/29/2020   MCV 80.6 05/29/2020   PLT 165 05/29/2020     STUDIES: CT Chest W Contrast  Result Date: 02/22/2021 CLINICAL DATA:  Lung nodule surveillance.  Cough. EXAM: CT CHEST WITH CONTRAST TECHNIQUE: Multidetector CT imaging of the chest was performed during intravenous contrast administration. CONTRAST:  22mL OMNIPAQUE IOHEXOL 350 MG/ML SOLN COMPARISON:  Multiple exams, including 02/26/2020 FINDINGS: Cardiovascular: Coronary, aortic arch, and branch vessel atherosclerotic vascular disease. A small amount of gas in the pulmonary artery and right atrium is most likely to be related to IV access and is most commonly clinically inconsequential. Mediastinum/Nodes: 1.5 by 1.1 cm partially calcified right thyroid nodule image 17 series 2, previously biopsied on 04/26/2020. This has been evaluated on previous imaging. (ref: J Am Coll Radiol. 2015 Feb;12(2): 143-50).Small hiatal hernia. Postoperative findings of sleeve gastrectomy. No pathologic adenopathy. Lungs/Pleura: Stable airway thickening and mild scattered airway plugging. The medial right  upper lobe nodule measures 1.3 by 0.9 by 0.9 cm (volume =  0.6 cm^3) on image 71 series 3, essentially stable from 02/26/2020. Back on 02/12/2018 this measured 1.3 by 1.0 by 1.2 cm (volume = 0.82 cm^3) and on 06/03/2016 this measured 1.6 by 1.4 by 1.5 cm (volume = 1.8 cm^3). This previously had accentuated metabolic activity with an SUV of 3.9 on 08/07/2016. Currently there is some mild scarring and volume loss in the vicinity of this nodule. Additional small scattered pulmonary nodules in the 2-5 mm range in the lungs are not appreciably changed from 06/03/2016. Upper Abdomen: Sleeve partial gastrectomy. Musculoskeletal: Thoracic spondylosis. IMPRESSION: 1. The right upper lobe pulmonary nodule is stable from 02/26/2020 but actually reduced in volume especially comparing back to 06/03/2016. This would be an unusual morphologic change for malignancy in the absence of targeted therapy, which tends to favor a benign etiology. I do note that prior biopsy attempt did not yield findings of malignancy. Although generally reassuring, I would suggest follow up chest CT imaging in 1 years time. 2. The other scattered nodules are substantially smaller and not changed from 2018, and are considered benign. 3.  Aortic Atherosclerosis (ICD10-I70.0).  Coronary atherosclerosis. 4. Airway thickening is present, suggesting bronchitis or reactive airways disease. Mild scattered airway plugging. Electronically Signed   By: Van Clines M.D.   On: 02/22/2021 12:05    ASSESSMENT: Mass of upper lobe of right lung  PLAN:    1. Mass of upper lobe of right lung:  Patient has been under surveillance for a mass of her right upper lung and had a biopsy back in 2019 which was negative for malignancy.  She has annual scans most recent being this morning which shows stable nodule with a reduction in volume 1 dated back to January 2018.  Radiologist thinks this is a benign etiology.  Although smaller in size and reassuring, he  recommends an additional CT scan in 1 year.  2.  Thyroid nodule: Per most recent CT scan there is a stable 1.5 x 1.1 cm partially calcified right thyroid nodule that was previously biopsied and nondiagnostic.  No pathologic adenopathy.  Disposition- RTC in 1 year for repeat CT scan and follow-up with Dr. Grayland Ormond.  I spent 25 minutes dedicated to the care of this patient (face-to-face and non-face-to-face) on the date of the encounter to include what is described in the assessment and plan.  Patient expressed understanding and was in agreement with this plan. She also understands that She can call clinic at any time with any questions, concerns, or complaints.   Cancer Staging No matching staging information was found for the patient.  Jacquelin Hawking, NP   02/22/2021 1:20 PM

## 2021-05-03 ENCOUNTER — Other Ambulatory Visit: Payer: Self-pay | Admitting: *Deleted

## 2021-05-03 MED ORDER — ALBUTEROL SULFATE HFA 108 (90 BASE) MCG/ACT IN AERS: 2.0000 | INHALATION_SPRAY | Freq: Four times a day (QID) | RESPIRATORY_TRACT | 2 refills | Status: DC | PRN

## 2021-05-27 DIAGNOSIS — M25511 Pain in right shoulder: Secondary | ICD-10-CM | POA: Insufficient documentation

## 2021-06-03 NOTE — Telephone Encounter (Signed)
error 

## 2021-06-28 ENCOUNTER — Ambulatory Visit
Admission: RE | Admit: 2021-06-28 | Discharge: 2021-06-28 | Disposition: A | Discharge status: Home / Self Care | Payer: Managed Care, Other (non HMO) | Source: Ambulatory Visit | Attending: Physician Assistant | Admitting: Physician Assistant

## 2021-06-28 ENCOUNTER — Ambulatory Visit
Admission: RE | Admit: 2021-06-28 | Discharge: 2021-06-28 | Disposition: A | Discharge status: Home / Self Care | Payer: Managed Care, Other (non HMO) | Attending: Physician Assistant | Admitting: Physician Assistant

## 2021-06-28 ENCOUNTER — Other Ambulatory Visit: Payer: Self-pay

## 2021-06-28 ENCOUNTER — Other Ambulatory Visit: Payer: Self-pay | Admitting: Physician Assistant

## 2021-06-28 DIAGNOSIS — R0602 Shortness of breath: Secondary | ICD-10-CM | POA: Diagnosis present

## 2021-06-30 ENCOUNTER — Telehealth: Payer: Self-pay | Admitting: *Deleted

## 2021-06-30 NOTE — Telephone Encounter (Signed)
Pt called in concerned about her recent chest xray results. States that she has not been feeling well the past few weeks and PCP ordered a chest xray. She has not heard back regarding the results and is feeling anxious. Reassurance provided and informed that that xray did not find any acute process. Recommended for her to keep appt as scheduled for follow up CT in Oct. Pt verbalized understanding and informed that PCP can further discuss her xray results and any further management.

## 2021-07-11 ENCOUNTER — Telehealth: Payer: Self-pay | Admitting: *Deleted

## 2021-07-11 NOTE — Telephone Encounter (Signed)
Patient called stating she is not getting any better and that her grandson has been diagnosed with Metapneumonic virus and is concerned about herself and asking for advise. Is this something she needs to see her PCP about, or will we see her for this?

## 2021-07-11 NOTE — Telephone Encounter (Signed)
Call returned to patient, granddaughter answered and said she is napping and will give her message to contact PCP

## 2021-09-13 ENCOUNTER — Other Ambulatory Visit: Payer: Self-pay

## 2021-09-13 ENCOUNTER — Emergency Department
Admission: EM | Admit: 2021-09-13 | Discharge: 2021-09-13 | Disposition: A | Discharge status: Home / Self Care | Payer: Managed Care, Other (non HMO) | Attending: Emergency Medicine | Admitting: Emergency Medicine

## 2021-09-13 ENCOUNTER — Emergency Department: Payer: Managed Care, Other (non HMO)

## 2021-09-13 DIAGNOSIS — J449 Chronic obstructive pulmonary disease, unspecified: Secondary | ICD-10-CM | POA: Insufficient documentation

## 2021-09-13 DIAGNOSIS — J45909 Unspecified asthma, uncomplicated: Secondary | ICD-10-CM | POA: Diagnosis not present

## 2021-09-13 DIAGNOSIS — I10 Essential (primary) hypertension: Secondary | ICD-10-CM | POA: Insufficient documentation

## 2021-09-13 DIAGNOSIS — E279 Disorder of adrenal gland, unspecified: Secondary | ICD-10-CM

## 2021-09-13 DIAGNOSIS — R197 Diarrhea, unspecified: Secondary | ICD-10-CM | POA: Diagnosis not present

## 2021-09-13 DIAGNOSIS — K429 Umbilical hernia without obstruction or gangrene: Secondary | ICD-10-CM | POA: Diagnosis not present

## 2021-09-13 DIAGNOSIS — N3000 Acute cystitis without hematuria: Secondary | ICD-10-CM | POA: Diagnosis not present

## 2021-09-13 DIAGNOSIS — I700 Atherosclerosis of aorta: Secondary | ICD-10-CM

## 2021-09-13 DIAGNOSIS — Z87442 Personal history of urinary calculi: Secondary | ICD-10-CM | POA: Insufficient documentation

## 2021-09-13 DIAGNOSIS — E119 Type 2 diabetes mellitus without complications: Secondary | ICD-10-CM | POA: Insufficient documentation

## 2021-09-13 DIAGNOSIS — Z20822 Contact with and (suspected) exposure to covid-19: Secondary | ICD-10-CM | POA: Diagnosis not present

## 2021-09-13 DIAGNOSIS — R81 Glycosuria: Secondary | ICD-10-CM | POA: Diagnosis not present

## 2021-09-13 DIAGNOSIS — R55 Syncope and collapse: Secondary | ICD-10-CM | POA: Diagnosis not present

## 2021-09-13 DIAGNOSIS — E278 Other specified disorders of adrenal gland: Secondary | ICD-10-CM | POA: Diagnosis not present

## 2021-09-13 LAB — CBC
HCT: 45.7 % (ref 36.0–46.0)
Hemoglobin: 14.3 g/dL (ref 12.0–15.0)
MCH: 25 pg — ABNORMAL LOW (ref 26.0–34.0)
MCHC: 31.3 g/dL (ref 30.0–36.0)
MCV: 80 fL (ref 80.0–100.0)
Platelets: 201 10*3/uL (ref 150–400)
RBC: 5.71 MIL/uL — ABNORMAL HIGH (ref 3.87–5.11)
RDW: 14 % (ref 11.5–15.5)
WBC: 9.3 10*3/uL (ref 4.0–10.5)
nRBC: 0 % (ref 0.0–0.2)

## 2021-09-13 LAB — BASIC METABOLIC PANEL
Anion gap: 8 (ref 5–15)
BUN: 22 mg/dL (ref 8–23)
CO2: 30 mmol/L (ref 22–32)
Calcium: 9.4 mg/dL (ref 8.9–10.3)
Chloride: 104 mmol/L (ref 98–111)
Creatinine, Ser: 0.78 mg/dL (ref 0.44–1.00)
GFR, Estimated: >60 mL/min (ref >60)
Glucose, Bld: 108 mg/dL — ABNORMAL HIGH (ref 70–99)
Potassium: 3.8 mmol/L (ref 3.5–5.1)
Sodium: 142 mmol/L (ref 135–145)

## 2021-09-13 LAB — HEPATIC FUNCTION PANEL
ALT: 21 U/L (ref 0–44)
AST: 29 U/L (ref 15–41)
Albumin: 4.1 g/dL (ref 3.5–5.0)
Alkaline Phosphatase: 90 U/L (ref 38–126)
Bilirubin, Direct: 0.2 mg/dL (ref 0.0–0.2)
Indirect Bilirubin: 0.5 mg/dL (ref 0.3–0.9)
Total Bilirubin: 0.7 mg/dL (ref 0.3–1.2)
Total Protein: 7.7 g/dL (ref 6.5–8.1)

## 2021-09-13 LAB — LIPASE, BLOOD: Lipase: 45 U/L (ref 11–51)

## 2021-09-13 LAB — URINALYSIS, ROUTINE W REFLEX MICROSCOPIC
Bilirubin Urine: NEGATIVE
Glucose, UA: >=500 mg/dL — AB
Ketones, ur: NEGATIVE mg/dL
Nitrite: NEGATIVE
Protein, ur: NEGATIVE mg/dL
Specific Gravity, Urine: 1.026 (ref 1.005–1.030)
pH: 5 (ref 5.0–8.0)

## 2021-09-13 LAB — RESP PANEL BY RT-PCR (FLU A&B, COVID) ARPGX2
Influenza A by PCR: NEGATIVE
Influenza B by PCR: NEGATIVE
SARS Coronavirus 2 by RT PCR: NEGATIVE

## 2021-09-13 LAB — TROPONIN I (HIGH SENSITIVITY): Troponin I (High Sensitivity): 6 ng/L (ref <18)

## 2021-09-13 MED ORDER — DICYCLOMINE HCL 10 MG PO CAPS: 10.0000 mg | ORAL_CAPSULE | Freq: Two times a day (BID) | ORAL | 0 refills | Status: DC | PRN

## 2021-09-13 MED ORDER — DICYCLOMINE HCL 10 MG PO CAPS
10.0000 mg | ORAL_CAPSULE | Freq: Once | ORAL | Status: AC
  Administered 2021-09-13: 10 mg via ORAL
  Filled 2021-09-13: qty 1

## 2021-09-13 MED ORDER — SODIUM CHLORIDE 0.9 % IV BOLUS
500.0000 mL | Freq: Once | INTRAVENOUS | Status: AC
  Administered 2021-09-13: 500 mL via INTRAVENOUS

## 2021-09-13 MED ORDER — IOHEXOL 300 MG/ML  SOLN
100.0000 mL | Freq: Once | INTRAMUSCULAR | Status: AC | PRN
  Administered 2021-09-13: 100 mL via INTRAVENOUS

## 2021-09-13 MED ORDER — ONDANSETRON 4 MG PO TBDP: 4.0000 mg | ORAL_TABLET | Freq: Three times a day (TID) | ORAL | 0 refills | Status: AC | PRN

## 2021-09-13 MED ORDER — LACTATED RINGERS IV BOLUS
1000.0000 mL | Freq: Once | INTRAVENOUS | Status: AC
  Administered 2021-09-13: 1000 mL via INTRAVENOUS

## 2021-09-13 MED ORDER — ONDANSETRON 4 MG PO TBDP: 4.0000 mg | ORAL_TABLET | Freq: Three times a day (TID) | ORAL | 0 refills | Status: DC | PRN

## 2021-09-13 MED ORDER — ONDANSETRON HCL 4 MG/2ML IJ SOLN
4.0000 mg | Freq: Once | INTRAMUSCULAR | Status: AC
  Administered 2021-09-13: 4 mg via INTRAVENOUS
  Filled 2021-09-13: qty 2

## 2021-09-13 MED ORDER — ACETAMINOPHEN 500 MG PO TABS
1000.0000 mg | ORAL_TABLET | Freq: Once | ORAL | Status: AC
  Administered 2021-09-13: 1000 mg via ORAL
  Filled 2021-09-13: qty 2

## 2021-09-13 MED ORDER — FOSFOMYCIN TROMETHAMINE 3 G PO PACK
3.0000 g | PACK | Freq: Once | ORAL | Status: AC
  Administered 2021-09-13: 3 g via ORAL
  Filled 2021-09-13: qty 3

## 2021-09-13 NOTE — ED Notes (Signed)
Patient ambulated by this tech. O2 sat. Read 98% on room air.  ?

## 2021-09-13 NOTE — ED Triage Notes (Addendum)
Pt comes into the ED via EMS from home with c/o dizziness with syncopal episode today, states she has been having watery diarrhea for a few days, pt c/o HA with nausea, pt has bruising to BL FA, states she not on blood thinners, unsure if she hit her head.  ?Denies any black or bloody stools but reports they are dark in color ? ?116/66 ?CBG152 ?HR93 ?95%RA ? ?

## 2021-09-13 NOTE — ED Notes (Signed)
Blue top sent with labs. ? ?Pt to CT. ?

## 2021-09-13 NOTE — ED Provider Notes (Signed)
? ?[facility] ?Provider Note ? ? ? Event Date/Time  ? First MD Initiated Contact with Patient 09/13/21 1453   ?  (approximate) ? ? ?History  ? ?Loss of Consciousness ? ? ?HPI ? ?Andrea Pollard is a 66 y.o. female with a past medical history of remote DVT/PE not on anticoagulation, COPD, remote tobacco abuse, HTN, history of kidney stones, anxiety, asthma, known lung nodule, OSA with CPAP at night who presents for evaluation following a syncopal episode.  Patient states she has had almost 5 days of nonbloody diarrhea and nausea with decreased appetite.  She states has had some crampy abdominal pain as well.  She states she think she passed out extremity a bath and woke up on the floor.  She is in chronic pain in her left knee and mid back but does not have any other new pain after waking in the floor including any headache, neck pain, any new abdominal pain or extremity pain.  She does notice some bruising or forearms.  No other recent passing out episodes.  No recent chest pain, cough, shortness of breath, vomiting, fevers, rash or other recent falls or passing out. ? ?  ?Past Medical History:  ?Diagnosis Date  ? Anxiety   ? Asthma   ? Complication of anesthesia   ? COPD (chronic obstructive pulmonary disease) ([place])   ? diagnosed from pulmonary function tests  ? Depression   ? Diabetes mellitus without complication ([place])   ? DVT (deep venous thrombosis) ([place])   ? Dvt femoral (deep venous thrombosis) ([place]) 2018  ? in lung also.  treated with xarelto for 6 months  ? GERD (gastroesophageal reflux disease)   ? Hepatitis   ? AGE 8-HEPATITIS B  ? History of hiatal hernia   ? History of kidney stones 2019  ? History of methicillin resistant staphylococcus aureus (MRSA) 2013  ? in neck due to ingrown hair. had surgery to drain cyst  ? History of palpitations 2019  ? due to a panic attack  ? Hypertension   ? Lung nodule 2019  ? being followed by dr. Grayland Ormond, dr Genevive Bi and dr. Mortimer Fries  ? Pneumonia  04/2016  ? PONV (postoperative nausea and vomiting)   ? NAUSEATED  ? Pulmonary emboli ([place]) 2018  ? Sleep apnea   ? CPAP  ? Vertigo   ? ? ? ?Physical Exam  ?Triage Vital Signs: ?ED Triage Vitals [09/13/21 1301]  ?Enc Vitals Group  ?   BP 131/82  ?   Pulse Rate 86  ?   Resp 16  ?   Temp 98.4 ?F (36.9 ?C)  ?   Temp Source Oral  ?   SpO2 97 %  ?   Weight   ?   Height   ?   Head Circumference   ?   Peak Flow   ?   Pain Score   ?   Pain Loc   ?   Pain Edu?   ?   Excl. in [place]?   ? ? ?Most recent vital signs: ?Vitals:  ? 09/13/21 1600 09/13/21 1700  ?BP: 130/60 (!) 144/54  ?Pulse: 80 77  ?Resp: 18 (!) 21  ?Temp:    ?SpO2: 98% 96%  ? ? ?General: Awake, no distress.  ?CV:  Prolonged capillary fill in the digits.  No significant murmur.  2+ radial pulse. ?Resp:  Normal effort.  Clear bilaterally. ?Abd:  No distention.  Mildly tender throughout without any rigidity or guarding. ?Other:  Dry mucous membranes.  Cranials 2 through 12 are grossly intact.  No obvious trauma to the face scalp head or neck.  Patient has full range of motion of her neck.  There is some scattered ecchymosis over the bilateral forearms but no significant pain or decreased strength at the bilateral elbows, wrists, hips, knees or ankles. ? ? ?ED Results / Procedures / Treatments  ?Labs ?(all labs ordered are listed, but only abnormal results are displayed) ?Labs Reviewed  ?BASIC METABOLIC PANEL - Abnormal; Notable for the following components:  ?    Result Value  ? Glucose, Bld 108 (*)   ? All other components within normal limits  ?CBC - Abnormal; Notable for the following components:  ? RBC 5.71 (*)   ? MCH 25.0 (*)   ? All other components within normal limits  ?URINALYSIS, ROUTINE W REFLEX MICROSCOPIC - Abnormal; Notable for the following components:  ? Color, Urine YELLOW (*)   ? APPearance CLOUDY (*)   ? Glucose, UA >=500 (*)   ? Hgb urine dipstick MODERATE (*)   ? Leukocytes,Ua LARGE (*)   ? Bacteria, UA FEW (*)   ? All other components within  normal limits  ?URINE CULTURE  ?C DIFFICILE QUICK SCREEN W PCR REFLEX    ?GASTROINTESTINAL PANEL BY PCR, STOOL (REPLACES STOOL CULTURE)  ?RESP PANEL BY RT-PCR (FLU A&B, COVID) [id number]  ?HEPATIC FUNCTION PANEL  ?LIPASE, BLOOD  ?CBG MONITORING, ED  ?TROPONIN I (HIGH SENSITIVITY)  ? ? ? ?EKG ? ?ECG is remarkable for sinus rhythm with a ventricular rate of 82, borderline left axis deviation, unremarkable intervals with nonspecific ST change versus artifact in lead III without any other clear evidence of acute ischemia or significant arrhythmia. ? ? ?RADIOLOGY ? ?CT head reviewed by myself without evidence of hemorrhage, ischemia, edema, mass effect or other clear acute process.  I also viewed radiology interpretation ? ?CT abdomen pelvis from interpretation without evidence of diverticulitis, SBO, ureteral stone, perinephric stranding, appendicitis or other clear process.  I also reviewed radiology interpretation and agree to findings of mild splenomegaly, tiny umbilical hernia containing fat and a left-sided adrenal nodule as well as aortic atherosclerosis without any other acute process. ? ? ?PROCEDURES: ? ?Critical Care performed: No ? ?.1-3 Lead EKG Interpretation ?Performed by: Lucrezia Starch, MD ?Authorized by: Lucrezia Starch, MD  ? ?  Interpretation: normal   ?  ECG rate assessment: normal   ?  Rhythm: sinus rhythm   ?  Ectopy: none   ?  Conduction: normal   ? ?The patient is on the cardiac monitor to evaluate for evidence of arrhythmia and/or significant heart rate changes. ? ? ?MEDICATIONS ORDERED IN ED: ?Medications  ?fosfomycin (MONUROL) packet 3 g (has no administration in time range)  ?acetaminophen (TYLENOL) tablet 1,000 mg (has no administration in time range)  ?sodium chloride 0.9 % bolus 500 mL (0 mLs Intravenous Stopped 09/13/21 1657)  ?lactated ringers bolus 1,000 mL (1,000 mLs Intravenous New Bag/Given 09/13/21 1521)  ?ondansetron (ZOFRAN) injection 4 mg (4 mg Intravenous Given 09/13/21 1520)   ?iohexol (OMNIPAQUE) 300 MG/ML solution 100 mL (100 mLs Intravenous Contrast Given 09/13/21 1542)  ?dicyclomine (BENTYL) capsule 10 mg (10 mg Oral Given 09/13/21 1657)  ? ? ? ?IMPRESSION / MDM / ASSESSMENT AND PLAN / ED COURSE  ?I reviewed the triage vital signs and the nursing notes. ?             ?               ? ?  Differential diagnosis includes, but is not limited to syncope secondary to vasovagal causes, orthostasis, dehydration, anemia, arrhythmia, metabolic derangement and acute infectious process such as colitis.  Patient does not have any chest pain, cough, shortness of breath or any abnormal vital signs to suggest recurrent PE at this time. ? ?ECG is remarkable for sinus rhythm with a ventricular rate of 82, borderline left axis deviation, unremarkable intervals with nonspecific ST change versus artifact in lead III without any other clear evidence of acute ischemia or significant arrhythmia.  Troponin is negative given absence of any associated chest pain have a low suspicion for ACS at this time. ? ?CT head reviewed by myself without evidence of hemorrhage, ischemia, edema, mass effect or other clear acute process.  I also viewed radiology interpretation ? ?CT abdomen pelvis from interpretation without evidence of diverticulitis, SBO, ureteral stone, perinephric stranding, appendicitis or other clear process.  I also reviewed radiology interpretation and agree to findings of mild splenomegaly, tiny umbilical hernia containing fat and a left-sided adrenal nodule as well as aortic atherosclerosis without any other acute process. ? ?BMP without any significant electrolyte or metabolic derangements.  CBC without leukocytosis or acute anemia.  Normal platelets.  Hepatic function panel unremarkable.  Lipase not suggestive of pancreatitis.  UA portion appears contaminated with 21-50 squamous epithelial cells but does have large leukocyte esterase with hemoglobin and glucose as well as some bacteria noted.  Urine  culture sent.  Given I cannot exclude cystitis the patient does have some crampy abdominal discomfort including suprapubic region we will treat with a one-time dose of fosfomycin as I do not believe she is septic or

## 2021-09-13 NOTE — Discharge Instructions (Signed)
CT of your abdomen and pelvis today showed: ?IMPRESSION: ?Mild splenomegaly. ?  ?Tiny umbilical hernia containing fat. ?  ?12 x 8 mm LEFT adrenal nodule, probably representing an adenoma; ?follow-up adrenal washout CT recommended in 1 year to re-evaluate. ?  ?No acute intra-abdominal or intrapelvic abnormalities. ?  ?Aortic Atherosclerosis (ICD10-I70.0). ?

## 2021-09-15 LAB — URINE CULTURE

## 2021-09-16 ENCOUNTER — Telehealth: Payer: Self-pay | Admitting: *Deleted

## 2021-09-16 NOTE — Telephone Encounter (Signed)
Pt called in concerned about findings on recent CT abdomen/pelvis. Informed pt that adrenal adenoma was found which is non cancerous lesion and is recommended to follow up in 1 year with another CT scan. Reassurance provided. Instructed pt to call back with any questions or needs. Pt verbalized understanding.  ?

## 2021-09-23 ENCOUNTER — Ambulatory Visit: Payer: Managed Care, Other (non HMO) | Admitting: Oncology

## 2022-01-28 ENCOUNTER — Ambulatory Visit
Admission: RE | Admit: 2022-01-28 | Discharge: 2022-01-28 | Disposition: A | Discharge status: Home / Self Care | Payer: Managed Care, Other (non HMO) | Source: Ambulatory Visit | Attending: Student | Admitting: Student

## 2022-01-28 ENCOUNTER — Other Ambulatory Visit: Payer: Self-pay | Admitting: Student

## 2022-01-28 DIAGNOSIS — R600 Localized edema: Secondary | ICD-10-CM

## 2022-01-28 DIAGNOSIS — M79605 Pain in left leg: Secondary | ICD-10-CM | POA: Diagnosis present

## 2022-02-21 ENCOUNTER — Ambulatory Visit
Admission: RE | Admit: 2022-02-21 | Discharge: 2022-02-21 | Disposition: A | Discharge status: Home / Self Care | Payer: Managed Care, Other (non HMO) | Source: Ambulatory Visit | Attending: Oncology | Admitting: Oncology

## 2022-02-21 DIAGNOSIS — R911 Solitary pulmonary nodule: Secondary | ICD-10-CM | POA: Diagnosis present

## 2022-02-21 LAB — POCT I-STAT CREATININE: Creatinine, Ser: 0.7 mg/dL (ref 0.44–1.00)

## 2022-02-21 MED ORDER — IOHEXOL 300 MG/ML  SOLN
75.0000 mL | Freq: Once | INTRAMUSCULAR | Status: AC | PRN
  Administered 2022-02-21: 75 mL via INTRAVENOUS

## 2022-02-21 MED ORDER — IOHEXOL 350 MG/ML SOLN: 75.0000 mL | Freq: Once | INTRAVENOUS | Status: DC | PRN

## 2022-02-21 NOTE — Progress Notes (Signed)
[place]  Telephone:(336) [phone] Fax:(336) [phone]  ID: CYAN CLIPPINGER OB: 1956/05/01  MR#: [id number]  NLG#:[id number]  Patient Care Team: Letta Median, MD as PCP - General (Family Medicine) Rockey Situ Kathlene November, MD as Consulting Physician (Cardiology) Telford Nab, RN as Registered Nurse   CHIEF COMPLAINT: Mass of upper lobe of right lung, thyroid nodule.  INTERVAL HISTORY: Patient returns to clinic today for routine yearly evaluation and discussion of her imaging results.  She continues to feel well and remains asymptomatic.  She has no neurologic complaints. She denies any recent fevers or illnesses. She has a good appetite and denies weight loss.  She denies any chest pain, shortness of breath, cough, or hemoptysis.  She denies any nausea, vomiting, constipation, or diarrhea. She has no urinary complaints.  Patient offers no specific complaints today.  REVIEW OF SYSTEMS:   Review of Systems  Constitutional: Negative.  Negative for fever, malaise/fatigue and weight loss.  Respiratory: Negative.  Negative for cough, hemoptysis and shortness of breath.   Cardiovascular: Negative.  Negative for chest pain and leg swelling.  Gastrointestinal: Negative.  Negative for abdominal pain.  Genitourinary: Negative.  Negative for dysuria.  Musculoskeletal: Negative.  Negative for back pain and neck pain.  Skin: Negative.  Negative for rash.  Neurological: Negative.  Negative for sensory change, focal weakness and weakness.  Psychiatric/Behavioral: Negative.  The patient is not nervous/anxious and does not have insomnia.     As per HPI. Otherwise, a complete review of systems is negative.  PAST MEDICAL HISTORY: Past Medical History:  Diagnosis Date   Anxiety    Asthma    Complication of anesthesia    COPD (chronic obstructive pulmonary disease) ([place])    diagnosed from pulmonary function tests   Depression    Diabetes mellitus without complication ([place])     DVT (deep venous thrombosis) ([place])    Dvt femoral (deep venous thrombosis) ([place]) 2018   in lung also.  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 had surgery to drain cyst   History of palpitations 2019   due to a panic attack   Hypertension    Lung nodule 2019   being followed by dr. Grayland Ormond, dr Genevive Bi and dr. Mortimer Fries   Pneumonia 04/2016   PONV (postoperative nausea and vomiting)    NAUSEATED   Pulmonary emboli ([place]) 2018   Sleep apnea    CPAP   Vertigo     PAST SURGICAL HISTORY: Past Surgical History:  Procedure Laterality Date   ABDOMINAL HYSTERECTOMY  1994   CESAREAN SECTION     CHOLECYSTECTOMY  2013   CYST REMOVAL NECK  2013   was ingrown hair . positive for mrsa at that time prior to removal in [place] URETEROSCOPY AND STENT PLACEMENT Right 05/31/2018   Procedure: CYSTOSCOPY WITH URETEROSCOPY, LASER LITHOTRIPSY AND STENT PLACEMENT;  Surgeon: Billey Co, MD;  Location: [facility] ORS;  Service: Urology;  Laterality: Right;   ELECTROMAGNETIC NAVIGATION BROCHOSCOPY N/A 08/15/2016   Procedure: ELECTROMAGNETIC NAVIGATION BRONCHOSCOPY;  Surgeon: Flora Lipps, MD;  Location: [facility] OR[facility];  Service: Cardiopulmonary;  Laterality: N/A;   ELECTROMAGNETIC NAVIGATION BROCHOSCOPY Right 11/07/2017   Procedure: ELECTROMAGNETIC NAVIGATION BRONCHOSCOPY;  Surgeon: Flora Lipps,  MD;  Location: [facility] OR[facility];  Service: Cardiopulmonary;  Laterality: Right;   LAPAROSCOPIC GASTRIC SLEEVE RESECTION  2015   lost 75 pounds    FAMILY HISTORY: Family History  Problem Relation Age of Onset   Stroke Father    Ovarian cancer Maternal Grandmother    Breast cancer Neg Hx     ADVANCED DIRECTIVES (Y/N):  N  HEALTH MAINTENANCE: Social History   Tobacco Use   Smoking  status: Former    Packs/day: 1.00    Years: 5.00    Total pack years: 5.00    Types: Cigarettes    Quit date: 08/12/1991    Years since quitting: 30.5   Smokeless tobacco: Never  Vaping Use   Vaping Use: Never used  Substance Use Topics   Alcohol use: No   Drug use: No     Colonoscopy:  PAP:  Bone density:  Lipid panel:  Allergies  Allergen Reactions   Ibuprofen Other (See Comments)    Cannot Use due to Gastric SLEEVE SURGERY   Levaquin [Levofloxacin In D5w] Hives   Sulfamethoxazole-Trimethoprim Hives   Nitroglycerin Other (See Comments)    Severe Hypotension    Current Outpatient Medications  Medication Sig Dispense Refill   albuterol (VENTOLIN HFA) 108 (90 Base) MCG/ACT inhaler Inhale 2 puffs into the lungs every 6 (six) hours as needed for wheezing or shortness of breath. 8 g 2   atorvastatin (LIPITOR) 80 MG tablet Take 80 mg by mouth at bedtime.   2   empagliflozin (JARDIANCE) 10 MG TABS tablet Take by mouth daily.     FLOVENT HFA 220 MCG/ACT inhaler Inhale 2 puffs into the lungs daily.   5   hydrOXYzine (VISTARIL) 25 MG capsule Take 50 mg by mouth 2 (two) times daily.     lansoprazole (PREVACID) 15 MG capsule Take 15 mg by mouth at bedtime.      lisinopril (ZESTRIL) 10 MG tablet Take 10 mg by mouth daily.     ONETOUCH VERIO test strip TEST ONCE D 100 each 3   OZEMPIC, 0.25 OR 0.5 MG/DOSE, 2 MG/1.5ML SOPN Inject 0.5 mg as directed once a week.     sertraline (ZOLOFT) 100 MG tablet Take 100 mg by mouth daily.  3   dicyclomine (BENTYL) 10 MG capsule Take 1 capsule (10 mg total) by mouth 2 (two) times daily as needed for up to 3 days for spasms. 6 capsule 0   No current facility-administered medications for this visit.    OBJECTIVE: Vitals:   02/23/22 1439  BP: 129/74  Pulse: 69  Temp: 98.9 F (37.2 C)     Body mass index is 32.62 kg/m.    ECOG FS:0 - Asymptomatic   General: Well-developed, well-nourished, no acute distress. Eyes: Pink conjunctiva, anicteric  sclera. HEENT: Normocephalic, moist mucous membranes. Lungs: No audible wheezing or coughing. Heart: Regular rate and rhythm. Abdomen: Soft, nontender, no obvious distention. Musculoskeletal: No edema, cyanosis, or clubbing. Neuro: Alert, answering all questions appropriately. Cranial nerves grossly intact. Skin: No rashes or petechiae noted. Psych: Normal affect.  LAB RESULTS:  Lab Results  Component Value Date   NA 142 09/13/2021   K 3.8 09/13/2021   CL 104 09/13/2021   CO2 30 09/13/2021   GLUCOSE 108 (H) 09/13/2021   BUN 22 09/13/2021   CREATININE 0.70 02/21/2022   CALCIUM 9.4 09/13/2021   PROT 7.7 09/13/2021   ALBUMIN 4.1 09/13/2021   AST 29 09/13/2021   ALT 21 09/13/2021   ALKPHOS 90 09/13/2021  BILITOT 0.7 09/13/2021   GFRNONAA >60 09/13/2021   GFRAA >60 12/07/2019    Lab Results  Component Value Date   WBC 9.3 09/13/2021   NEUTROABS 4.1 09/21/2018   HGB 14.3 09/13/2021   HCT 45.7 09/13/2021   MCV 80.0 09/13/2021   PLT 201 09/13/2021     STUDIES: CT Chest W Contrast  Result Date: 02/22/2022 CLINICAL DATA:  Annual follow-up, history of lung nodule. EXAM: CT CHEST WITH CONTRAST TECHNIQUE: Multidetector CT imaging of the chest was performed during intravenous contrast administration. RADIATION DOSE REDUCTION: This exam was performed according to the departmental dose-optimization program which includes automated exposure control, adjustment of the mA and/or kV according to patient size and/or use of iterative reconstruction technique. CONTRAST:  74m OMNIPAQUE IOHEXOL 300 MG/ML  SOLN COMPARISON:  February 22, 2021 FINDINGS: Cardiovascular: Aortic caliber is normal with moderate calcific aortic atherosclerosis about the aortic arch. Three-vessel coronary artery disease similar to prior imaging. No pericardial effusion or nodularity. Central pulmonary vasculature is normal caliber. Mediastinum/Nodes: No thoracic inlet, axillary, mediastinal or hilar adenopathy. Esophagus  grossly normal. Lungs/Pleura: Spiculated juxta hilar nodule in the RIGHT upper lobe (image 58/3) 12 x 9 mm. Very similar to imaging dating back to 2019 where it measured up to 13 mm greatest axial dimension. Small nodules adjacent to this area in the RIGHT upper lobe are similarly unchanged. Fissural distortion and scarring along the minor fissure in the RIGHT chest, unchanged. RIGHT middle lobe pulmonary nodule (image 66/3) 4 mm. This is unchanged. RIGHT lower lobe pulmonary nodules largest 5 mm (image 87/3 LEFT upper lobe pulmonary nodule 4 mm (image 73/3) slightly increased in conspicuity but present on previous imaging previously in the range of 2 mm. Scattered LEFT lower lobe nodules less than 5 mm are similarly stable. Airways are largely patent. Narrowing of RIGHT upper lobe bronchus in the area of the central spiculated nodule is similar. There is no evidence of mosaic attenuation currently. Upper Abdomen: Fissural widening of hepatic fissures. Post cholecystectomy. Imaged portions of the liver, pancreas and spleen without acute process. Mild splenic enlargement similar to previous imaging. Adrenal glands are normal. Post sleeve gastrectomy. Musculoskeletal: No acute or destructive bone process. Spinal degenerative changes. IMPRESSION: 1. Spiculated juxta hilar nodule in the RIGHT upper lobe is similar to imaging dating back to 2019 where it measured up to 13 mm greatest axial dimension. Relative stability over 4 years would be unusual even in the setting of a an indolent neoplasm. 2. Spiculated appearance remains suspicious but lack of change is reassuring. Could consider indolent process given multiplicity of small nodules such as DIPNECH. No signs of air trapping currently. Continued attention on follow-up may be helpful. 3. Three-vessel coronary artery disease. 4. Signs of liver disease. 5. Post sleeve gastrectomy. 6. Aortic atherosclerosis. Aortic Atherosclerosis (ICD10-I70.0). Electronically Signed    By: GZetta BillsM.D.   On: 02/22/2022 15:28   [place] Img Lower Unilateral Left (DVT)  Result Date: 01/28/2022 CLINICAL DATA:  Left leg pain and swelling EXAM: LEFT LOWER EXTREMITY VENOUS DOPPLER ULTRASOUND TECHNIQUE: Gray-scale sonography with compression, as well as color and duplex ultrasound, were performed to evaluate the deep venous system(s) from the level of the common femoral vein through the popliteal and proximal calf veins. COMPARISON:  12/07/2019 FINDINGS: VENOUS Normal compressibility of the common femoral, superficial femoral, and popliteal veins, as well as the visualized calf veins. Visualized portions of profunda femoral vein and great saphenous vein unremarkable. No filling defects to suggest DVT on  grayscale or color Doppler imaging. Doppler waveforms show normal direction of venous flow, normal respiratory plasticity and response to augmentation. Limited views of the contralateral common femoral vein are unremarkable. OTHER None. Limitations: none IMPRESSION: Negative examination for deep venous thrombosis in the left lower extremity. Electronically Signed   By: Delanna Ahmadi M.D.   On: 01/28/2022 15:45    ASSESSMENT: Mass of upper lobe of right lung  PLAN:    1. Mass of upper lobe of right lung: Patient underwent biopsy on November 07, 2017 that was negative for malignancy.  CT scan results from February 22, 2022 reviewed independently and reported as above with persistent, but stable right upper lobe spiculated lesion.  Stability of the lesion since 2019 is reassuring.  Recommendation is continued attention to follow-up.  Plan to repeat 1 final CT scan in 1 year.  If lesion remains stable, can discontinue imaging and discharge patient from clinic.  No further intervention is needed.  Follow-up in 1 year after CT scan.     2. Bilateral PE/left leg DVT: Resolved.  Patient has now completed anticoagulation.  Diagnosed by CT scan on November 28, 2016. Patient has no obvious transient risk  factors. Hypercoagulable workup was negative except for an elevated DRVVT which can be attributed to the anticoagulation she is taking at that time. Work-up at [facility] was also reported as negative. 3.  Back pain: Chronic and unchanged.  Patient does not complain of this today. 4.  Thyroid nodule: Likely benign.  FNA on April 26, 2020 was nondiagnostic.  Patient expressed understanding and was in agreement with this plan. She also understands that She can call clinic at any time with any questions, concerns, or complaints.    Cancer Staging  No matching staging information was found for the patient.  Lloyd Huger, MD   02/24/2022 6:22 AM

## 2022-02-22 ENCOUNTER — Encounter: Payer: Self-pay | Admitting: Oncology

## 2022-02-23 ENCOUNTER — Encounter: Payer: Self-pay | Admitting: Oncology

## 2022-02-23 ENCOUNTER — Ambulatory Visit: Payer: Managed Care, Other (non HMO) | Admitting: Oncology

## 2022-02-23 ENCOUNTER — Inpatient Hospital Stay: Payer: Managed Care, Other (non HMO) | Attending: Oncology | Admitting: Oncology

## 2022-02-23 ENCOUNTER — Ambulatory Visit: Payer: Managed Care, Other (non HMO) | Admitting: Medical Oncology

## 2022-02-23 VITALS — BP 129/74 | HR 69 | Temp 98.9°F | Ht 65.0 in | Wt 196.0 lb

## 2022-02-23 DIAGNOSIS — Z86718 Personal history of other venous thrombosis and embolism: Secondary | ICD-10-CM | POA: Diagnosis not present

## 2022-02-23 DIAGNOSIS — I10 Essential (primary) hypertension: Secondary | ICD-10-CM | POA: Diagnosis not present

## 2022-02-23 DIAGNOSIS — Z86711 Personal history of pulmonary embolism: Secondary | ICD-10-CM | POA: Diagnosis not present

## 2022-02-23 DIAGNOSIS — Z8041 Family history of malignant neoplasm of ovary: Secondary | ICD-10-CM | POA: Diagnosis not present

## 2022-02-23 DIAGNOSIS — R911 Solitary pulmonary nodule: Secondary | ICD-10-CM | POA: Diagnosis present

## 2022-02-23 DIAGNOSIS — Z87891 Personal history of nicotine dependence: Secondary | ICD-10-CM | POA: Diagnosis not present

## 2022-02-23 DIAGNOSIS — E041 Nontoxic single thyroid nodule: Secondary | ICD-10-CM | POA: Diagnosis not present

## 2022-02-23 DIAGNOSIS — Z90710 Acquired absence of both cervix and uterus: Secondary | ICD-10-CM | POA: Insufficient documentation

## 2022-02-23 DIAGNOSIS — E119 Type 2 diabetes mellitus without complications: Secondary | ICD-10-CM | POA: Diagnosis not present

## 2022-03-30 ENCOUNTER — Other Ambulatory Visit: Payer: Self-pay

## 2022-03-30 ENCOUNTER — Encounter: Payer: Self-pay | Admitting: Family Medicine

## 2022-03-30 ENCOUNTER — Ambulatory Visit
Admission: EM | Admit: 2022-03-30 | Discharge: 2022-03-30 | Disposition: A | Discharge status: Home / Self Care | Payer: Managed Care, Other (non HMO) | Attending: Emergency Medicine | Admitting: Emergency Medicine

## 2022-03-30 DIAGNOSIS — Z1152 Encounter for screening for COVID-19: Secondary | ICD-10-CM | POA: Diagnosis not present

## 2022-03-30 DIAGNOSIS — E119 Type 2 diabetes mellitus without complications: Secondary | ICD-10-CM | POA: Diagnosis not present

## 2022-03-30 DIAGNOSIS — Z7951 Long term (current) use of inhaled steroids: Secondary | ICD-10-CM | POA: Insufficient documentation

## 2022-03-30 DIAGNOSIS — Z86711 Personal history of pulmonary embolism: Secondary | ICD-10-CM | POA: Insufficient documentation

## 2022-03-30 DIAGNOSIS — I10 Essential (primary) hypertension: Secondary | ICD-10-CM | POA: Insufficient documentation

## 2022-03-30 DIAGNOSIS — J441 Chronic obstructive pulmonary disease with (acute) exacerbation: Secondary | ICD-10-CM | POA: Diagnosis present

## 2022-03-30 DIAGNOSIS — G4733 Obstructive sleep apnea (adult) (pediatric): Secondary | ICD-10-CM | POA: Insufficient documentation

## 2022-03-30 DIAGNOSIS — J0100 Acute maxillary sinusitis, unspecified: Secondary | ICD-10-CM | POA: Diagnosis not present

## 2022-03-30 DIAGNOSIS — Z7984 Long term (current) use of oral hypoglycemic drugs: Secondary | ICD-10-CM | POA: Insufficient documentation

## 2022-03-30 DIAGNOSIS — I2699 Other pulmonary embolism without acute cor pulmonale: Secondary | ICD-10-CM | POA: Insufficient documentation

## 2022-03-30 DIAGNOSIS — Z79899 Other long term (current) drug therapy: Secondary | ICD-10-CM | POA: Diagnosis not present

## 2022-03-30 DIAGNOSIS — Z86718 Personal history of other venous thrombosis and embolism: Secondary | ICD-10-CM | POA: Insufficient documentation

## 2022-03-30 DIAGNOSIS — I25118 Atherosclerotic heart disease of native coronary artery with other forms of angina pectoris: Secondary | ICD-10-CM | POA: Insufficient documentation

## 2022-03-30 DIAGNOSIS — R911 Solitary pulmonary nodule: Secondary | ICD-10-CM | POA: Insufficient documentation

## 2022-03-30 LAB — POCT RAPID STREP A (OFFICE): Rapid Strep A Screen: NEGATIVE

## 2022-03-30 LAB — RESP PANEL BY RT-PCR (RSV, FLU A&B, COVID)  RVPGX2
Influenza A by PCR: NEGATIVE
Influenza B by PCR: NEGATIVE
Resp Syncytial Virus by PCR: NEGATIVE
SARS Coronavirus 2 by RT PCR: NEGATIVE

## 2022-03-30 MED ORDER — AMOXICILLIN 875 MG PO TABS: 875.0000 mg | ORAL_TABLET | Freq: Two times a day (BID) | ORAL | 0 refills | Status: AC

## 2022-03-30 MED ORDER — ALBUTEROL SULFATE HFA 108 (90 BASE) MCG/ACT IN AERS
1.0000 | INHALATION_SPRAY | Freq: Four times a day (QID) | RESPIRATORY_TRACT | 0 refills | Status: AC | PRN
Start: 2022-03-30 — End: ?

## 2022-03-30 MED ORDER — PREDNISONE 10 MG PO TABS: 40.0000 mg | ORAL_TABLET | Freq: Every day | ORAL | 0 refills | Status: AC

## 2022-03-30 NOTE — ED Provider Notes (Signed)
[facility]    CSN: [id number] Arrival date & time: 03/30/22  3016      History   Chief Complaint Chief Complaint  Patient presents with   Sore Throat    HPI Andrea Pollard is a 66 y.o. female.  Patient presents with 4 day history of productive cough, wheezing, sore throat, headache.  No fever, rash, shortness of breath, chest pain, vomiting, diarrhea, or other symptoms.  No treatments attempted at home as patient is unsure of what she can take.  Her medical history includes lung nodule, obstructive sleep apnea, COPD, pulmonary embolism, DVT, CAD, hypertension, diabetes.    The history is provided by the patient and medical records.    Past Medical History:  Diagnosis Date   Anxiety    Asthma    Complication of anesthesia    COPD (chronic obstructive pulmonary disease) ([place])    diagnosed from pulmonary function tests   Depression    Diabetes mellitus without complication ([place])    DVT (deep venous thrombosis) ([place])    Dvt femoral (deep venous thrombosis) ([place]) 2018   in lung also.  treated with xarelto for 6 months   GERD (gastroesophageal reflux disease)    Hepatitis    AGE 51-HEPATITIS B   History of hiatal hernia    History of kidney stones 2019   History of methicillin resistant staphylococcus aureus (MRSA) 2013   in neck due to ingrown hair. had surgery to drain cyst   History of palpitations 2019   due to a panic attack   Hypertension    Lung nodule 2019   being followed by dr. Grayland Ormond, dr Genevive Bi and dr. Mortimer Fries   Pneumonia 04/2016   PONV (postoperative nausea and vomiting)    NAUSEATED   Pulmonary emboli ([place]) 2018   Sleep apnea    CPAP   Vertigo     Patient Active Problem List   Diagnosis Date Noted   Pulmonary embolism ([place]) 03/04/2017   Benign hypertension 10/05/2016   Constipation 10/05/2016   Gastroesophageal reflux disease 10/05/2016   Hypercholesterolemia 10/05/2016   Indigestion 10/05/2016   Obstructive sleep apnea of adult  10/05/2016   Type 2 diabetes mellitus ([place]) 10/05/2016   Vitamin D deficiency 10/05/2016   Coronary artery disease of native artery of native heart with stable angina pectoris ([place]) 09/27/2016   Aortic atherosclerosis ([place]) 09/27/2016   History of smoking 09/27/2016   Lung nodule 06/07/2016   Morbid obesity ([place]) 04/23/2014    Past Surgical History:  Procedure Laterality Date   ABDOMINAL HYSTERECTOMY  1994   CESAREAN SECTION     CHOLECYSTECTOMY  2013   CYST REMOVAL NECK  2013   was ingrown hair . positive for mrsa at that time prior to removal in [place] URETEROSCOPY AND STENT PLACEMENT Right 05/31/2018   Procedure: CYSTOSCOPY WITH URETEROSCOPY, LASER LITHOTRIPSY AND STENT PLACEMENT;  Surgeon: Billey Co, MD;  Location: [facility] ORS;  Service: Urology;  Laterality: Right;   ELECTROMAGNETIC NAVIGATION BROCHOSCOPY N/A 08/15/2016   Procedure: ELECTROMAGNETIC NAVIGATION BRONCHOSCOPY;  Surgeon: Flora Lipps, MD;  Location: [facility] ORS;  Service: Cardiopulmonary;  Laterality: N/A;   ELECTROMAGNETIC NAVIGATION BROCHOSCOPY Right 11/07/2017   Procedure: ELECTROMAGNETIC NAVIGATION BRONCHOSCOPY;  Surgeon: Flora Lipps, MD;  Location: [facility] ORS;  Service: Cardiopulmonary;  Laterality: Right;   LAPAROSCOPIC GASTRIC SLEEVE RESECTION  2015   lost 75 pounds    OB History   No obstetric history on file.      Home Medications  Prior to Admission medications   Medication Sig Start Date End Date Taking? Authorizing Provider  albuterol (VENTOLIN HFA) 108 (90 Base) MCG/ACT inhaler Inhale 1-2 puffs into the lungs every 6 (six) hours as needed. 03/30/22  Yes Sharion Balloon, NP  amoxicillin (AMOXIL) 875 MG tablet Take 1 tablet (875 mg total) by mouth 2 (two) times daily for 7 days. 03/30/22 04/06/22 Yes Sharion Balloon, NP  predniSONE (DELTASONE) 10 MG tablet Take 4 tablets (40 mg total) by mouth daily for 5 days. 03/30/22 04/04/22 Yes Sharion Balloon, NP  atorvastatin (LIPITOR) 80 MG tablet Take 80  mg by mouth at bedtime.  08/30/16   [provider]  dicyclomine (BENTYL) 10 MG capsule Take 1 capsule (10 mg total) by mouth 2 (two) times daily as needed for up to 3 days for spasms. 09/13/21 09/16/21  Lucrezia Starch, MD  empagliflozin (JARDIANCE) 10 MG TABS tablet Take by mouth daily.    [provider]  FLOVENT HFA 220 MCG/ACT inhaler Inhale 2 puffs into the lungs daily.  12/11/16   [provider]  hydrOXYzine (VISTARIL) 25 MG capsule Take 50 mg by mouth 2 (two) times daily.    [provider]  lansoprazole (PREVACID) 15 MG capsule Take 15 mg by mouth at bedtime.     [provider]  lisinopril (ZESTRIL) 10 MG tablet Take 10 mg by mouth daily.    [provider]  [facility] VERIO test strip TEST ONCE D 09/21/18   Schuyler Amor, MD  OZEMPIC, 0.25 OR 0.5 MG/DOSE, 2 MG/1.5ML SOPN Inject 0.5 mg as directed once a week. 08/19/20   [provider]  sertraline (ZOLOFT) 100 MG tablet Take 100 mg by mouth 3 (three) times daily. 12/03/17   [provider]    Family History Family History  Problem Relation Age of Onset   Stroke Father    Ovarian cancer Maternal Grandmother    Breast cancer Neg Hx     Social History Social History   Tobacco Use   Smoking status: Former    Packs/day: 1.00    Years: 5.00    Total pack years: 5.00    Types: Cigarettes    Quit date: 08/12/1991    Years since quitting: 30.6   Smokeless tobacco: Never  Vaping Use   Vaping Use: Never used  Substance Use Topics   Alcohol use: No   Drug use: No     Allergies   Ibuprofen, Levaquin [levofloxacin in d5w], Sulfamethoxazole-trimethoprim, and Nitroglycerin   Review of Systems Review of Systems  Constitutional:  Negative for chills and fever.  HENT:  Positive for congestion and sore throat. Negative for ear pain.   Respiratory:  Positive for cough and wheezing. Negative for shortness of breath.   Cardiovascular:  Negative for chest pain and  palpitations.  Gastrointestinal:  Negative for diarrhea and vomiting.  Neurological:  Positive for headaches.  All other systems reviewed and are negative.    Physical Exam Triage Vital Signs ED Triage Vitals  Enc Vitals Group     BP 03/30/22 0947 132/82     Pulse Rate 03/30/22 0937 92     Resp 03/30/22 0937 18     Temp 03/30/22 0937 98.4 F (36.9 C)     Temp src --      SpO2 03/30/22 0937 95 %     Weight 03/30/22 0941 190 lb (86.2 kg)     Height 03/30/22 0941 '5\' 5"'$  (1.651 m)  Head Circumference --      Peak Flow --      Pain Score 03/30/22 0936 6     Pain Loc --      Pain Edu? --      Excl. in [place]? --    No data found.  Updated Vital Signs BP 132/82   Pulse 92   Temp 98.4 F (36.9 C)   Resp 18   Ht '5\' 5"'$  (1.651 m)   Wt 190 lb (86.2 kg)   SpO2 95%   BMI 31.62 kg/m   Visual Acuity Right Eye Distance:   Left Eye Distance:   Bilateral Distance:    Right Eye Near:   Left Eye Near:    Bilateral Near:     Physical Exam Vitals and nursing note reviewed.  Constitutional:      General: She is not in acute distress.    Appearance: Normal appearance. She is well-developed. She is not ill-appearing.  HENT:     Right Ear: Tympanic membrane normal.     Left Ear: Tympanic membrane normal.     Nose: Nose normal.     Mouth/Throat:     Mouth: Mucous membranes are moist.     Pharynx: Oropharynx is clear.  Cardiovascular:     Rate and Rhythm: Normal rate and regular rhythm.     Heart sounds: Normal heart sounds.  Pulmonary:     Effort: Pulmonary effort is normal. No respiratory distress.     Breath sounds: Normal breath sounds.  Musculoskeletal:     Cervical back: Neck supple.  Skin:    General: Skin is warm and dry.  Neurological:     Mental Status: She is alert.  Psychiatric:        Mood and Affect: Mood normal.        Behavior: Behavior normal.      UC Treatments / Results  Labs (all labs ordered are listed, but only abnormal results are  displayed) Labs Reviewed  RESP PANEL BY RT-PCR (RSV, FLU A&B, COVID)  [id number]  POCT RAPID STREP A (OFFICE)    EKG   Radiology No results found.  Procedures Procedures (including critical care time)  Medications Ordered in UC Medications - No data to display  Initial Impression / Assessment and Plan / UC Course  I have reviewed the triage vital signs and the nursing notes.  Pertinent labs & imaging results that were available during my care of the patient were reviewed by me and considered in my medical decision making (see chart for details).   COPD exacerbation, acute sinusitis.  No acute respiratory distress, O2 sat 95% on room air.  COVID, Flu, RSV pending.  Based on patient's medical history and exam today, treating with albuterol inhaler, prednisone, amoxicillin.  Instructed patient to follow up with her PCP.  Patient provided on COPD exacerbation and sinus infection.  She agrees to plan of care.    Final Clinical Impressions(s) / UC Diagnoses   Final diagnoses:  COPD exacerbation ([place])  Acute non-recurrent maxillary sinusitis     Discharge Instructions      Use the albuterol inhaler as directed.  Take the amoxicillin and prednisone as directed.  Follow up with your primary care provider.        ED Prescriptions     Medication Sig Dispense Auth. Provider   albuterol (VENTOLIN HFA) 108 (90 Base) MCG/ACT inhaler Inhale 1-2 puffs into the lungs every 6 (six) hours as needed. 18 g Sharion Balloon,  NP   predniSONE (DELTASONE) 10 MG tablet Take 4 tablets (40 mg total) by mouth daily for 5 days. 20 tablet Sharion Balloon, NP   amoxicillin (AMOXIL) 875 MG tablet Take 1 tablet (875 mg total) by mouth 2 (two) times daily for 7 days. 14 tablet Sharion Balloon, NP      PDMP not reviewed this encounter.   Sharion Balloon, NP 03/30/22 1011

## 2022-03-30 NOTE — Discharge Instructions (Addendum)
Use the albuterol inhaler as directed.  Take the amoxicillin and prednisone as directed.  Follow up with your primary care provider.

## 2022-03-30 NOTE — ED Triage Notes (Signed)
Patient to Urgent Care with complaints of sore throat, productive cough with green phlegm/ wheezing, and headache. Symptoms started three days ago.   Reports she felt like she may have had a fever but doesn't own a thermometer.

## 2022-04-03 ENCOUNTER — Ambulatory Visit: Payer: Managed Care, Other (non HMO) | Admitting: Gastroenterology

## 2022-04-03 ENCOUNTER — Other Ambulatory Visit: Payer: Self-pay

## 2022-04-03 ENCOUNTER — Encounter: Payer: Self-pay | Admitting: Gastroenterology

## 2022-04-03 VITALS — BP 129/85 | HR 92 | Temp 98.1°F | Ht 65.0 in | Wt 194.5 lb

## 2022-04-03 DIAGNOSIS — K582 Mixed irritable bowel syndrome: Secondary | ICD-10-CM

## 2022-04-03 DIAGNOSIS — K529 Noninfective gastroenteritis and colitis, unspecified: Secondary | ICD-10-CM

## 2022-04-03 MED ORDER — NA SULFATE-K SULFATE-MG SULF 17.5-3.13-1.6 GM/177ML PO SOLN: 354.0000 mL | Freq: Once | ORAL | 0 refills | Status: AC

## 2022-04-03 NOTE — Progress Notes (Unsigned)
Cephas Darby, MD [address]  [place]  [place], [place] [address]  Main: [phone]  Fax: [phone]    Gastroenterology Consultation  Referring Provider:     Letta Median, MD Primary Care Physician:  Letta Median, MD Primary Gastroenterologist:  Dr. Cephas Darby Reason for Consultation: Alternating constipation and diarrhea        HPI:   Andrea Pollard is a 66 y.o. female referred by Dr. Rebeca Alert, Durene Cal, MD  for consultation & management of alternating constipation and diarrhea.  Patient reports several years history of alternating episodes of constipation and nonbloody diarrhea associated with generalized abdominal cramps and bloating.  She reports not having a bowel movement for 4 days followed by having several episodes of nonbloody bowel movements.  This has impacted her quality of life, cannot go out.  She does acknowledge drinking carbonated beverages, consumption of sugary drinks.  Patient is accompanied by her daughter today.  She has been gaining weight.  History of metabolic syndrome, COPD.  Labs revealed normal CBC and CMP.   NSAIDs: None  Antiplts/Anticoagulants/Anti thrombotics: None  GI Procedures: None  Past Medical History:  Diagnosis Date   Anxiety    Asthma    Complication of anesthesia    COPD (chronic obstructive pulmonary disease) ([place])    diagnosed from pulmonary function tests   Depression    Diabetes mellitus without complication ([place])    DVT (deep venous thrombosis) ([place])    Dvt femoral (deep venous thrombosis) ([place]) 2018   in lung also.  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 had surgery to drain cyst   History of palpitations 2019   due to a panic attack   Hypertension     Lung nodule 2019   being followed by dr. Grayland Ormond, dr Genevive Bi and dr. Mortimer Fries   Pneumonia 04/2016   PONV (postoperative nausea and vomiting)    NAUSEATED   Pulmonary emboli ([place]) 2018   Sleep apnea    CPAP   Vertigo     Past Surgical History:  Procedure Laterality Date   ABDOMINAL HYSTERECTOMY  1994   CESAREAN SECTION     CHOLECYSTECTOMY  2013   CYST REMOVAL NECK  2013   was ingrown hair . positive for mrsa at that time prior to removal in [place] URETEROSCOPY AND STENT PLACEMENT Right 05/31/2018   Procedure: CYSTOSCOPY WITH URETEROSCOPY, LASER LITHOTRIPSY AND STENT PLACEMENT;  Surgeon: Billey Co, MD;  Location: [facility] ORS;  Service: Urology;  Laterality: Right;   ELECTROMAGNETIC NAVIGATION BROCHOSCOPY N/A 08/15/2016   Procedure: ELECTROMAGNETIC NAVIGATION BRONCHOSCOPY;  Surgeon: Flora Lipps, MD;  Location: [facility] OR[facility];  Service: Cardiopulmonary;  Laterality: N/A;   ELECTROMAGNETIC NAVIGATION BROCHOSCOPY Right 11/07/2017   Procedure: ELECTROMAGNETIC NAVIGATION BRONCHOSCOPY;  Surgeon: Flora Lipps, MD;  Location: [facility] OR[facility];  Service: Cardiopulmonary;  Laterality: Right;   LAPAROSCOPIC GASTRIC SLEEVE RESECTION  2015   lost 75 pounds     Current Outpatient Medications:    albuterol (VENTOLIN HFA) 108 (90 Base) MCG/ACT inhaler, Inhale 1-2 puffs into the lungs every 6 (six) hours as needed., Disp: 18 g, Rfl: 0   amoxicillin (AMOXIL) 875 MG tablet, Take 1 tablet (761  mg total) by mouth 2 (two) times daily for 7 days., Disp: 14 tablet, Rfl: 0   atorvastatin (LIPITOR) 80 MG tablet, Take 80 mg by mouth at bedtime. , Disp: , Rfl: 2   citalopram (CELEXA) 40 MG tablet, TK 1 T PO D FOR MOOD, Disp: , Rfl:    empagliflozin (JARDIANCE) 10 MG TABS tablet, Take by mouth daily., Disp: , Rfl:    FLOVENT HFA 220 MCG/ACT inhaler, Inhale 2 puffs into the lungs daily. , Disp: , Rfl: 5   hydrochlorothiazide (HYDRODIURIL) 25 MG tablet, Take 1 tablet by mouth daily., Disp: , Rfl:    hydrOXYzine  (VISTARIL) 25 MG capsule, Take 50 mg by mouth 2 (two) times daily., Disp: , Rfl:    ibuprofen (ADVIL) 600 MG tablet, Take 1 tablet by mouth 3 (three) times daily., Disp: , Rfl:    lansoprazole (PREVACID) 15 MG capsule, Take 15 mg by mouth at bedtime. , Disp: , Rfl:    lisinopril-hydrochlorothiazide (ZESTORETIC) 10-12.5 MG tablet, , Disp: , Rfl:    meclizine (ANTIVERT) 25 MG tablet, , Disp: , Rfl:    metFORMIN (GLUCOPHAGE) 1000 MG tablet, TAKE 1 TABLET BY MOUTH TWICE DAILY WITH BREAKFAST AND DINNER FOR DIABETES, Disp: , Rfl:    montelukast (SINGULAIR) 10 MG tablet, TAKE 1 TABLET BY MOUTH DAILY FOR ASTHMA OR ALLERGIES, Disp: , Rfl:    ONETOUCH VERIO test strip, TEST ONCE D, Disp: 100 each, Rfl: 3   OZEMPIC, 0.25 OR 0.5 MG/DOSE, 2 MG/1.5ML SOPN, Inject 0.5 mg as directed once a week., Disp: , Rfl:    predniSONE (DELTASONE) 10 MG tablet, Take 4 tablets (40 mg total) by mouth daily for 5 days., Disp: 20 tablet, Rfl: 0   sertraline (ZOLOFT) 100 MG tablet, Take 100 mg by mouth 3 (three) times daily., Disp: , Rfl: 3   traZODone (DESYREL) 150 MG tablet, Take 150 mg by mouth at bedtime., Disp: , Rfl:    Family History  Problem Relation Age of Onset   Stroke Father    Ovarian cancer Maternal Grandmother    Breast cancer Neg Hx      Social History   Tobacco Use   Smoking status: Former    Packs/day: 1.00    Years: 5.00    Total pack years: 5.00    Types: Cigarettes    Quit date: 08/12/1991    Years since quitting: 30.6   Smokeless tobacco: Never  Vaping Use   Vaping Use: Never used  Substance Use Topics   Alcohol use: No   Drug use: No    Allergies as of 04/03/2022 - Review Complete 04/03/2022  Allergen Reaction Noted   Ibuprofen Other (See Comments) 10/25/2014   Levaquin [levofloxacin in d5w] Hives 10/25/2014   Sulfamethoxazole-trimethoprim Hives 04/09/2014   Nitroglycerin Other (See Comments) 10/25/2014    Review of Systems:    All systems reviewed and negative except where noted  in HPI.   Physical Exam:  BP 129/85 (BP Location: Left Arm, Patient Position: Sitting, Cuff Size: Normal)   Pulse 92   Temp 98.1 F (36.7 C) (Oral)   Ht '5\' 5"'$  (1.651 m)   Wt 194 lb 8 oz (88.2 kg)   BMI 32.37 kg/m  No LMP recorded. Patient has had a hysterectomy.  General:   Alert,  Well-developed, well-nourished, pleasant and cooperative in NAD Head:  Normocephalic and atraumatic. Eyes:  Sclera clear, no icterus.   Conjunctiva pink. Ears:  Normal auditory acuity. Nose:  No deformity, discharge, or lesions.  Mouth:  No deformity or lesions,oropharynx pink & moist. Neck:  Supple; no masses or thyromegaly. Lungs:  Respirations even and unlabored.  Clear throughout to auscultation.   No wheezes, crackles, or rhonchi. No acute distress. Heart:  Regular rate and rhythm; no murmurs, clicks, rubs, or gallops. Abdomen:  Normal bowel sounds. Soft, non-tender and non-distended without masses, hepatosplenomegaly or hernias noted.  No guarding or rebound tenderness.   Rectal: Not performed Msk:  Symmetrical without gross deformities. Good, equal movement & strength bilaterally. Pulses:  Normal pulses noted. Extremities:  No clubbing or edema.  No cyanosis. Neurologic:  Alert and oriented x3;  grossly normal neurologically. Skin:  Intact without significant lesions or rashes. No jaundice. Psych:  Alert and cooperative. Normal mood and affect.  Imaging Studies: Reviewed  Assessment and Plan:   Andrea Pollard is a 66 y.o. female with metabolic syndrome, COPD, history of DVT, completed anticoagulation is seen in consultation for several years history of alternating episodes of diarrhea and constipation associated with abdominal cramps  Recommend upper endoscopy and colonoscopy with biopsies If negative, recommend to check pancreatic fecal elastase levels Advised patient to completely eliminate carbonated beverages, sugary drinks, simple carbs Advised patient to start taking fiber supplement  such as Metamucil along with stool softener MiraLAX daily to avoid episodes of constipation  Follow up in 3 months   Cephas Darby, MD

## 2022-04-04 ENCOUNTER — Encounter: Payer: Self-pay | Admitting: Gastroenterology

## 2022-04-04 DIAGNOSIS — F32A Depression, unspecified: Secondary | ICD-10-CM | POA: Insufficient documentation

## 2022-04-04 DIAGNOSIS — G4730 Sleep apnea, unspecified: Secondary | ICD-10-CM | POA: Insufficient documentation

## 2022-04-04 DIAGNOSIS — F419 Anxiety disorder, unspecified: Secondary | ICD-10-CM | POA: Insufficient documentation

## 2022-04-10 ENCOUNTER — Other Ambulatory Visit: Payer: Self-pay | Admitting: Family Medicine

## 2022-04-10 DIAGNOSIS — Z1231 Encounter for screening mammogram for malignant neoplasm of breast: Secondary | ICD-10-CM

## 2022-04-18 ENCOUNTER — Telehealth: Payer: Self-pay | Admitting: Gastroenterology

## 2022-04-18 NOTE — Telephone Encounter (Signed)
Patient is calling requesting something for to be called in for nausea her work call back is [phone]

## 2022-04-19 ENCOUNTER — Encounter: Payer: Self-pay | Admitting: Gastroenterology

## 2022-04-19 MED ORDER — ONDANSETRON HCL 4 MG PO TABS: 4.0000 mg | ORAL_TABLET | Freq: Three times a day (TID) | ORAL | 0 refills | Status: AC | PRN

## 2022-04-19 NOTE — Telephone Encounter (Signed)
Called patient daughter and she states the patient has been nausea for the last couple of days. She is scared to do the bowel prep because she is scared she is going to throw up. Informed daughter I would call in Zofran and to have her take it 1 hour before she starts the prep and as needed after that. She asked what time her procedure is tomorrow. Informed daughter that endo unit would call today between 1-3

## 2022-04-19 NOTE — Addendum Note (Signed)
Addended by: Ulyess Blossom L on: 04/19/2022 08:12 AM   Modules accepted: Orders

## 2022-04-20 ENCOUNTER — Encounter
Admission: RE | Disposition: A | Discharge status: Home / Self Care | Payer: Self-pay | Source: Ambulatory Visit | Attending: Gastroenterology

## 2022-04-20 ENCOUNTER — Ambulatory Visit
Admission: RE | Admit: 2022-04-20 | Discharge: 2022-04-20 | Disposition: A | Discharge status: Home / Self Care | Payer: Managed Care, Other (non HMO) | Source: Ambulatory Visit | Attending: Gastroenterology | Admitting: Gastroenterology

## 2022-04-20 ENCOUNTER — Encounter: Payer: Self-pay | Admitting: Gastroenterology

## 2022-04-20 ENCOUNTER — Ambulatory Visit: Payer: Managed Care, Other (non HMO) | Admitting: Anesthesiology

## 2022-04-20 DIAGNOSIS — D124 Benign neoplasm of descending colon: Secondary | ICD-10-CM | POA: Diagnosis not present

## 2022-04-20 DIAGNOSIS — E1100 Type 2 diabetes mellitus with hyperosmolarity without nonketotic hyperglycemic-hyperosmolar coma (NKHHC): Secondary | ICD-10-CM

## 2022-04-20 DIAGNOSIS — I10 Essential (primary) hypertension: Secondary | ICD-10-CM | POA: Insufficient documentation

## 2022-04-20 DIAGNOSIS — Z86718 Personal history of other venous thrombosis and embolism: Secondary | ICD-10-CM | POA: Insufficient documentation

## 2022-04-20 DIAGNOSIS — K219 Gastro-esophageal reflux disease without esophagitis: Secondary | ICD-10-CM | POA: Insufficient documentation

## 2022-04-20 DIAGNOSIS — Z7984 Long term (current) use of oral hypoglycemic drugs: Secondary | ICD-10-CM | POA: Insufficient documentation

## 2022-04-20 DIAGNOSIS — Z87891 Personal history of nicotine dependence: Secondary | ICD-10-CM | POA: Diagnosis not present

## 2022-04-20 DIAGNOSIS — I2510 Atherosclerotic heart disease of native coronary artery without angina pectoris: Secondary | ICD-10-CM | POA: Diagnosis not present

## 2022-04-20 DIAGNOSIS — Z79899 Other long term (current) drug therapy: Secondary | ICD-10-CM | POA: Diagnosis not present

## 2022-04-20 DIAGNOSIS — K5710 Diverticulosis of small intestine without perforation or abscess without bleeding: Secondary | ICD-10-CM | POA: Diagnosis not present

## 2022-04-20 DIAGNOSIS — F32A Depression, unspecified: Secondary | ICD-10-CM | POA: Insufficient documentation

## 2022-04-20 DIAGNOSIS — E119 Type 2 diabetes mellitus without complications: Secondary | ICD-10-CM | POA: Insufficient documentation

## 2022-04-20 DIAGNOSIS — J449 Chronic obstructive pulmonary disease, unspecified: Secondary | ICD-10-CM | POA: Diagnosis not present

## 2022-04-20 DIAGNOSIS — Z7985 Long-term (current) use of injectable non-insulin antidiabetic drugs: Secondary | ICD-10-CM | POA: Insufficient documentation

## 2022-04-20 DIAGNOSIS — K529 Noninfective gastroenteritis and colitis, unspecified: Secondary | ICD-10-CM

## 2022-04-20 DIAGNOSIS — K449 Diaphragmatic hernia without obstruction or gangrene: Secondary | ICD-10-CM | POA: Diagnosis not present

## 2022-04-20 DIAGNOSIS — Z86711 Personal history of pulmonary embolism: Secondary | ICD-10-CM | POA: Insufficient documentation

## 2022-04-20 DIAGNOSIS — G4730 Sleep apnea, unspecified: Secondary | ICD-10-CM | POA: Insufficient documentation

## 2022-04-20 DIAGNOSIS — Z8614 Personal history of Methicillin resistant Staphylococcus aureus infection: Secondary | ICD-10-CM | POA: Diagnosis not present

## 2022-04-20 DIAGNOSIS — F419 Anxiety disorder, unspecified: Secondary | ICD-10-CM | POA: Diagnosis not present

## 2022-04-20 HISTORY — PX: ESOPHAGOGASTRODUODENOSCOPY (EGD) WITH PROPOFOL: SHX5813

## 2022-04-20 HISTORY — PX: COLONOSCOPY WITH PROPOFOL: SHX5780

## 2022-04-20 LAB — GLUCOSE, CAPILLARY: Glucose-Capillary: 105 mg/dL — ABNORMAL HIGH (ref 70–99)

## 2022-04-20 SURGERY — COLONOSCOPY WITH PROPOFOL
Anesthesia: General

## 2022-04-20 MED ORDER — PROPOFOL 10 MG/ML IV BOLUS
INTRAVENOUS | Status: DC | PRN
  Administered 2022-04-20: 70 mg via INTRAVENOUS
  Administered 2022-04-20: 30 mg via INTRAVENOUS

## 2022-04-20 MED ORDER — SODIUM CHLORIDE 0.9 % IV SOLN: INTRAVENOUS | Status: DC

## 2022-04-20 MED ORDER — PROPOFOL 500 MG/50ML IV EMUL
INTRAVENOUS | Status: DC | PRN
  Administered 2022-04-20: 120 ug/kg/min via INTRAVENOUS

## 2022-04-20 MED ORDER — GLYCOPYRROLATE 0.2 MG/ML IJ SOLN
INTRAMUSCULAR | Status: AC
  Filled 2022-04-20: qty 1

## 2022-04-20 MED ORDER — GLYCOPYRROLATE 0.2 MG/ML IJ SOLN
INTRAMUSCULAR | Status: DC | PRN
  Administered 2022-04-20: .2 mg via INTRAVENOUS

## 2022-04-20 MED ORDER — ONDANSETRON HCL 4 MG/2ML IJ SOLN
INTRAMUSCULAR | Status: DC | PRN
  Administered 2022-04-20: 4 mg via INTRAVENOUS

## 2022-04-20 MED ORDER — LIDOCAINE 2% (20 MG/ML) 5 ML SYRINGE
INTRAMUSCULAR | Status: DC | PRN
  Administered 2022-04-20: 20 mg via INTRAVENOUS

## 2022-04-20 NOTE — H&P (Signed)
Cephas Darby, MD [address]  [place]  [place], [place] [address]  Main: [phone]  Fax: [phone] Pager: [phone]  Primary Care Physician:  Letta Median, MD Primary Gastroenterologist:  Dr. Cephas Darby  Pre-Procedure History & Physical: HPI:  Andrea Pollard is a 66 y.o. female is here for an endoscopy and colonoscopy.   Past Medical History:  Diagnosis Date   Anxiety    Asthma    Complication of anesthesia    COPD (chronic obstructive pulmonary disease) ([place])    diagnosed from pulmonary function tests   Depression    Diabetes mellitus without complication ([place])    DVT (deep venous thrombosis) ([place])    Dvt femoral (deep venous thrombosis) ([place]) 2018   in lung also.  treated with xarelto for 6 months   GERD (gastroesophageal reflux disease)    Hepatitis    AGE 85-HEPATITIS B   History of hiatal hernia    History of kidney stones 2019   History of methicillin resistant staphylococcus aureus (MRSA) 2013   in neck due to ingrown hair. had surgery to drain cyst   History of palpitations 2019   due to a panic attack   Hypertension    Lung nodule 2019   being followed by dr. Grayland Ormond, dr Genevive Bi and dr. Mortimer Fries   Pneumonia 04/2016   PONV (postoperative nausea and vomiting)    NAUSEATED   Pulmonary emboli ([place]) 2018   Sleep apnea    CPAP   Vertigo     Past Surgical History:  Procedure Laterality Date   ABDOMINAL HYSTERECTOMY  1994   CESAREAN SECTION     CHOLECYSTECTOMY  2013   CYST REMOVAL NECK  2013   was ingrown hair . positive for mrsa at that time prior to removal in [place] URETEROSCOPY AND STENT PLACEMENT Right 05/31/2018   Procedure: CYSTOSCOPY WITH URETEROSCOPY, LASER LITHOTRIPSY AND STENT PLACEMENT;  Surgeon: Billey Co, MD;  Location: [facility];  Service: Urology;  Laterality: Right;   ELECTROMAGNETIC NAVIGATION BROCHOSCOPY N/A 08/15/2016   Procedure: ELECTROMAGNETIC NAVIGATION BRONCHOSCOPY;  Surgeon: Flora Lipps,  MD;  Location: [facility];  Service: Cardiopulmonary;  Laterality: N/A;   ELECTROMAGNETIC NAVIGATION BROCHOSCOPY Right 11/07/2017   Procedure: ELECTROMAGNETIC NAVIGATION BRONCHOSCOPY;  Surgeon: Flora Lipps, MD;  Location: [facility];  Service: Cardiopulmonary;  Laterality: Right;   LAPAROSCOPIC GASTRIC SLEEVE RESECTION  2015   lost 75 pounds    Prior to Admission medications   Medication Sig Start Date End Date Taking? Authorizing Provider  albuterol (VENTOLIN HFA) 108 (90 Base) MCG/ACT inhaler Inhale 1-2 puffs into the lungs every 6 (six) hours as needed. 03/30/22   Sharion Balloon, NP  atorvastatin (LIPITOR) 80 MG tablet Take 80 mg by mouth at bedtime.  08/30/16   [provider]  citalopram (CELEXA) 40 MG tablet TK 1 T PO D FOR MOOD    [provider]  empagliflozin (JARDIANCE) 10 MG TABS tablet Take by mouth daily.    [provider]  FLOVENT HFA 220 MCG/ACT inhaler Inhale 2 puffs into the lungs daily.  12/11/16   [provider]  hydrochlorothiazide (HYDRODIURIL) 25 MG tablet Take 1 tablet by mouth daily.    [provider]  hydrOXYzine (VISTARIL) 25 MG capsule Take 50 mg by mouth 2 (two) times daily.    [provider]  ibuprofen (ADVIL) 600 MG tablet Take 1 tablet by mouth 3 (three) times daily.    [provider]  lansoprazole (PREVACID) 15 MG capsule Take 15 mg by mouth at bedtime.     [provider]  lisinopril-hydrochlorothiazide (ZESTORETIC) 10-12.5 MG tablet     [provider]  meclizine (ANTIVERT) 25 MG tablet     [provider]  metFORMIN (GLUCOPHAGE) 1000 MG tablet TAKE 1 TABLET BY MOUTH TWICE DAILY WITH BREAKFAST AND DINNER FOR DIABETES    [provider]  montelukast (SINGULAIR) 10 MG tablet TAKE 1 TABLET BY MOUTH DAILY FOR ASTHMA OR ALLERGIES    [provider]  ondansetron (ZOFRAN) 4 MG tablet Take 1 tablet (4 mg total) by mouth every 8 (eight) hours as needed for nausea or  vomiting. 04/19/22   Lin Landsman, MD  [facility] VERIO test strip TEST ONCE D 09/21/18   Schuyler Amor, MD  OZEMPIC, 0.25 OR 0.5 MG/DOSE, 2 MG/1.5ML SOPN Inject 0.5 mg as directed once a week. 08/19/20   [provider]  sertraline (ZOLOFT) 100 MG tablet Take 100 mg by mouth 3 (three) times daily. 12/03/17   [provider]  traZODone (DESYREL) 150 MG tablet Take 150 mg by mouth at bedtime.    [provider]    Allergies as of 04/03/2022 - Review Complete 04/03/2022  Allergen Reaction Noted   Ibuprofen Other (See Comments) 10/25/2014   Levaquin [levofloxacin in d5w] Hives 10/25/2014   Sulfamethoxazole-trimethoprim Hives 04/09/2014   Nitroglycerin Other (See Comments) 10/25/2014    Family History  Problem Relation Age of Onset   Stroke Father    Ovarian cancer Maternal Grandmother    Breast cancer Neg Hx     Social History   Socioeconomic History   Marital status: Divorced    Spouse name: Not on file   Number of children: Not on file   Years of education: Not on file   Highest education level: Not on file  Occupational History   Not on file  Tobacco Use   Smoking status: Former    Packs/day: 1.00    Years: 5.00    Total pack years: 5.00    Types: Cigarettes    Quit date: 08/12/1991    Years since quitting: 30.7   Smokeless tobacco: Never  Vaping Use   Vaping Use: Never used  Substance and Sexual Activity   Alcohol use: No   Drug use: No   Sexual activity: Not on file  Other Topics Concern   Not on file  Social History Narrative   Not on file   Social Determinants of Health   Financial Resource Strain: Not on file  Food Insecurity: Not on file  Transportation Needs: Not on file  Physical Activity: Not on file  Stress: Not on file  Social Connections: Not on file  Intimate Partner Violence: Not on file    Review of Systems: See HPI, otherwise negative ROS  Physical Exam: BP (!) 142/97   Pulse 87   Temp (!) 96.3 F (35.7  C) (Temporal)   Resp 18   Ht '5\' 5"'$  (1.651 m)   Wt 88.3 kg   SpO2 97%   BMI 32.38 kg/m  General:   Alert,  pleasant and cooperative in NAD Head:  Normocephalic and atraumatic. Neck:  Supple; no masses or thyromegaly. Lungs:  Clear throughout to auscultation.    Heart:  Regular rate and rhythm. Abdomen:  Soft, nontender and nondistended. Normal bowel sounds, without guarding, and without rebound.   Neurologic:  Alert and  oriented x4;  grossly normal neurologically.  Impression/Plan: Noel Christmas  Weyandt is here for an endoscopy and colonoscopy to be performed for chronic diarrhea  Risks, benefits, limitations, and alternatives regarding  endoscopy and colonoscopy have been reviewed with the patient.  Questions have been answered.  All parties agreeable.   Sherri Sear, MD  04/20/2022, 11:22 AM

## 2022-04-20 NOTE — Transfer of Care (Signed)
Immediate Anesthesia Transfer of Care Note  Patient: Andrea Pollard  Procedure(s) Performed: COLONOSCOPY WITH PROPOFOL ESOPHAGOGASTRODUODENOSCOPY (EGD) WITH PROPOFOL  Patient Location: Endoscopy Unit  Anesthesia Type:General  Level of Consciousness: awake, alert , and oriented  Airway & Oxygen Therapy: Patient Spontanous Breathing  Post-op Assessment: Report given to RN and Post -op Vital signs reviewed and stable  Post vital signs: Reviewed  Last Vitals:  Vitals Value Taken Time  BP 98/57 04/20/22 1228  Temp 36 C 04/20/22 1228  Pulse 86 04/20/22 1233  Resp 13 04/20/22 1233  SpO2 99 % 04/20/22 1233  Vitals shown include unvalidated device data.  Last Pain:  Vitals:   04/20/22 1228  TempSrc: Temporal  PainSc: 0-No pain         Complications: No notable events documented.

## 2022-04-20 NOTE — Op Note (Signed)
[facility] Gastroenterology Patient Name: Andrea Pollard Procedure Date: 04/20/2022 11:34 AM MRN: [id number] Account #: [id number] Date of Birth: [birth year] Admit Type: Outpatient Age: 66 Room: [facility] ENDO ROOM 3 Gender: Female Note Status: Finalized Instrument Name: Colonoscope [id number] Procedure:             Colonoscopy Indications:           Last colonoscopy: June 2012, Chronic diarrhea,                         Clinically significant diarrhea of unexplained origin Providers:             Lin Landsman MD, MD Referring MD:          Baxter Kail. Rebeca Alert MD, MD (Referring MD) Medicines:             General Anesthesia Complications:         No immediate complications. Estimated blood loss: None. Procedure:             Pre-Anesthesia Assessment:                        - Prior to the procedure, a History and Physical was                         performed, and patient medications and allergies were                         reviewed. The patient is competent. The risks and                         benefits of the procedure and the sedation options and                         risks were discussed with the patient. All questions                         were answered and informed consent was obtained.                         Patient identification and proposed procedure were                         verified by the physician, the nurse, the                         anesthesiologist, the anesthetist and the technician                         in the pre-procedure area in the procedure room in the                         endoscopy suite. Mental Status Examination: alert and                         oriented. Airway Examination: normal oropharyngeal                         airway and neck mobility. Respiratory Examination:  clear to auscultation. CV Examination: normal.                         Prophylactic Antibiotics: The patient does not require                          prophylactic antibiotics. Prior Anticoagulants: The                         patient has taken no anticoagulant or antiplatelet                         agents. ASA Grade Assessment: III - A patient with                         severe systemic disease. After reviewing the risks and                         benefits, the patient was deemed in satisfactory                         condition to undergo the procedure. The anesthesia                         plan was to use general anesthesia. Immediately prior                         to administration of medications, the patient was                         re-assessed for adequacy to receive sedatives. The                         heart rate, respiratory rate, oxygen saturations,                         blood pressure, adequacy of pulmonary ventilation, and                         response to care were monitored throughout the                         procedure. The physical status of the patient was                         re-assessed after the procedure.                        After obtaining informed consent, the colonoscope was                         passed under direct vision. Throughout the procedure,                         the patient's blood pressure, pulse, and oxygen                         saturations were monitored continuously. The  Colonoscope was introduced through the anus and                         advanced to the the terminal ileum, with                         identification of the appendiceal orifice and IC                         valve. The colonoscopy was extremely difficult due to                         significant looping. Successful completion of the                         procedure was aided by applying abdominal pressure.                         The patient tolerated the procedure well. The quality                         of the bowel preparation was evaluated using the BBPS                          [facility] Bowel Preparation Scale) with scores of: Right                         Colon = 3, Transverse Colon = 3 and Left Colon = 3                         (entire mucosa seen well with no residual staining,                         small fragments of stool or opaque liquid). The total                         BBPS score equals 9. The terminal ileum, ileocecal                         valve, appendiceal orifice, and rectum were                         photographed. Findings:      The perianal and digital rectal examinations were normal. Pertinent       negatives include normal sphincter tone and no palpable rectal lesions.      The terminal ileum appeared normal.      A 10 mm polyp was found in the descending colon. The polyp was sessile.       Preparations were made for mucosal resection. Chromoscopy with methylene       blue was done. Demarcation of the lesion was performed with narrow band       imaging to clearly identify the boundaries of the lesion. [place] was       injected to raise the lesion. Snare mucosal resection was performed.       Resection and retrieval were complete. To prevent bleeding after mucosal       resection, two hemostatic clips  were successfully placed (MR safe). Clip       manufacturer: [employer]. There was no bleeding during, or at the       end, of the procedure. Estimated blood loss: none.      Normal mucosa was found in the entire colon. Biopsies for histology were       taken with a cold forceps from the entire colon for evaluation of       microscopic colitis.      The retroflexed view of the distal rectum and anal verge was normal and       showed no anal or rectal abnormalities. Impression:            - The examined portion of the ileum was normal.                        - One 10 mm polyp in the descending colon, removed                         with mucosal resection. Resected and retrieved. Clip                         manufacturer:  [employer]. Clips (MR safe) were                         placed.                        - Normal mucosa in the entire examined colon. Biopsied.                        - The distal rectum and anal verge are normal on                         retroflexion view.                        - Mucosal resection was performed. Resection and                         retrieval were complete. Recommendation:        - Discharge patient to home (with escort).                        - Resume previous diet today.                        - Continue present medications.                        - Await pathology results.                        - Repeat colonoscopy in 3 - 5 years for surveillance                         based on pathology results.                        - Return to my office as previously scheduled. Procedure Code(s):     --- Professional ---  96789, Colonoscopy, flexible; with endoscopic mucosal                         resection                        45380, 12, Colonoscopy, flexible; with biopsy, single                         or multiple Diagnosis Code(s):     --- Professional ---                        D12.4, Benign neoplasm of descending colon                        K52.9, Noninfective gastroenteritis and colitis,                         unspecified                        R19.7, Diarrhea, unspecified CPT copyright 2022 American Medical Association. All rights reserved. The codes documented in this report are preliminary and upon coder review may  be revised to meet current compliance requirements. Dr. Ulyess Mort Lin Landsman MD, MD 04/20/2022 12:31:18 PM This report has been signed electronically. Number of Addenda: 0 Note Initiated On: 04/20/2022 11:34 AM Scope Withdrawal Time: 0 hours 23 minutes 48 seconds  Total Procedure Duration: 0 hours 32 minutes 2 seconds  Estimated Blood Loss:  Estimated blood loss: none.      [facility]

## 2022-04-20 NOTE — Anesthesia Postprocedure Evaluation (Signed)
Anesthesia Post Note  Patient: Andrea Pollard  Procedure(s) Performed: COLONOSCOPY WITH PROPOFOL ESOPHAGOGASTRODUODENOSCOPY (EGD) WITH PROPOFOL  Patient location during evaluation: Endoscopy Anesthesia Type: General Level of consciousness: awake and alert Pain management: pain level controlled Vital Signs Assessment: post-procedure vital signs reviewed and stable Respiratory status: spontaneous breathing, nonlabored ventilation, respiratory function stable and patient connected to nasal cannula oxygen Cardiovascular status: blood pressure returned to baseline and stable Postop Assessment: no apparent nausea or vomiting Anesthetic complications: no   No notable events documented.   Last Vitals:  Vitals:   04/20/22 1238 04/20/22 1248  BP: (!) 89/44 (!) 103/52  Pulse: 80 74  Resp: 17 19  Temp:    SpO2: 100% 100%    Last Pain:  Vitals:   04/20/22 1248  TempSrc:   PainSc: 0-No pain                 Arita Miss

## 2022-04-20 NOTE — Op Note (Signed)
[facility] Patient Name: Andrea Pollard Procedure Date: 04/20/2022 11:35 AM MRN: [id number] Account #: [id number] Date of Birth: 04/02/1956 Admit Type: Outpatient Age: 66 Room: [facility] ENDO ROOM 3 Gender: Female Note Status: Finalized Instrument Name: Upper Endoscope [id number] Procedure:             Upper GI endoscopy Indications:           Diarrhea Providers:             Lin Landsman MD, MD Referring MD:          Baxter Kail. Rebeca Alert MD, MD (Referring MD) Medicines:             General Anesthesia Complications:         No immediate complications. Estimated blood loss: None. Procedure:             Pre-Anesthesia Assessment:                        - Prior to the procedure, a History and Physical was                         performed, and patient medications and allergies were                         reviewed. The patient is competent. The risks and                         benefits of the procedure and the sedation options and                         risks were discussed with the patient. All questions                         were answered and informed consent was obtained.                         Patient identification and proposed procedure were                         verified by the physician, the nurse, the                         anesthesiologist, the anesthetist and the technician                         in the pre-procedure area in the procedure room in the                         endoscopy suite. Mental Status Examination: alert and                         oriented. Airway Examination: normal oropharyngeal                         airway and neck mobility. Respiratory Examination:                         clear to auscultation. CV Examination: normal.  Prophylactic Antibiotics: The patient does not require                         prophylactic antibiotics. Prior Anticoagulants: The                         patient has taken  no anticoagulant or antiplatelet                         agents. ASA Grade Assessment: III - A patient with                         severe systemic disease. After reviewing the risks and                         benefits, the patient was deemed in satisfactory                         condition to undergo the procedure. The anesthesia                         plan was to use general anesthesia. Immediately prior                         to administration of medications, the patient was                         re-assessed for adequacy to receive sedatives. The                         heart rate, respiratory rate, oxygen saturations,                         blood pressure, adequacy of pulmonary ventilation, and                         response to care were monitored throughout the                         procedure. The physical status of the patient was                         re-assessed after the procedure.                        After obtaining informed consent, the endoscope was                         passed under direct vision. Throughout the procedure,                         the patient's blood pressure, pulse, and oxygen                         saturations were monitored continuously. The Endoscope                         was introduced through the mouth, and advanced to the  second part of duodenum. The upper GI endoscopy was                         accomplished without difficulty. The patient tolerated                         the procedure well. Findings:      The duodenal bulb and second portion of the duodenum were normal.       Biopsies were taken with a cold forceps for histology.      A medium-sized hiatal hernia was present.      The cardia and gastric fundus were normal on retroflexion.      The entire examined stomach was normal. Biopsies were taken with a cold       forceps for Helicobacter pylori testing.      The gastroesophageal junction and  examined esophagus were normal.      Esophagogastric landmarks were identified: the gastroesophageal junction       was found at 35 cm from the incisors.      A non-bleeding diverticulum was found in the second portion of the       duodenum. Impression:            - Normal duodenal bulb and second portion of the                         duodenum. Biopsied.                        - Medium-sized hiatal hernia.                        - Normal stomach. Biopsied.                        - Normal gastroesophageal junction and esophagus.                        - Esophagogastric landmarks identified.                        - Non-bleeding duodenal diverticulum. Recommendation:        - Await pathology results.                        - Follow an antireflux regimen indefinitely.                        - Proceed with colonoscopy as scheduled                        See colonoscopy report Procedure Code(s):     --- Professional ---                        [phone], Esophagogastroduodenoscopy, flexible,                         transoral; with biopsy, single or multiple Diagnosis Code(s):     --- Professional ---                        K44.9, Diaphragmatic hernia without obstruction or  gangrene                        R19.7, Diarrhea, unspecified                        K57.10, Diverticulosis of small intestine without                         perforation or abscess without bleeding CPT copyright 2022 American Medical Association. All rights reserved. The codes documented in this report are preliminary and upon coder review may  be revised to meet current compliance requirements. Dr. Ulyess Mort Lin Landsman MD, MD 04/20/2022 11:50:59 AM This report has been signed electronically. Number of Addenda: 0 Note Initiated On: 04/20/2022 11:35 AM Estimated Blood Loss:  Estimated blood loss: none.      [facility]

## 2022-04-20 NOTE — Anesthesia Preprocedure Evaluation (Signed)
Anesthesia Evaluation  Patient identified by MRN, date of birth, ID band Patient awake    Reviewed: Allergy & Precautions, NPO status , Patient's Chart, lab work & pertinent test results  History of Anesthesia Complications (+) PONV and history of anesthetic complications  Airway Mallampati: III  TM Distance: >3 FB Neck ROM: Full    Dental  (+) Poor Dentition   Pulmonary asthma , sleep apnea and Continuous Positive Airway Pressure Ventilation , COPD,  COPD inhaler, former smoker   breath sounds clear to auscultation- rhonchi (-) wheezing      Cardiovascular hypertension, Pt. on medications + CAD  (-) Past MI, (-) Cardiac Stents and (-) CABG  Rhythm:Regular Rate:Normal - Systolic murmurs and - Diastolic murmurs    Neuro/Psych neg Seizures PSYCHIATRIC DISORDERS Anxiety Depression    negative neurological ROS     GI/Hepatic Neg liver ROS, hiatal hernia,GERD  ,,  Endo/Other  diabetes, Oral Hypoglycemic Agents  On ozempic, last taken over a week ago  Renal/GU negative Renal ROS     Musculoskeletal negative musculoskeletal ROS (+)    Abdominal  (+) + obese  Peds  Hematology negative hematology ROS (+)   Anesthesia Other Findings Past Medical History: No date: Anxiety No date: Asthma No date: Complication of anesthesia No date: COPD (chronic obstructive pulmonary disease) (HCC)     Comment:  diagnosed from pulmonary function tests No date: Depression No date: Diabetes mellitus without complication ([place]) 5784: Dvt femoral (deep venous thrombosis) (HCC)     Comment:  in lung also.  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 had surgery to drain  cyst 2019: History of palpitations     Comment:  due to a panic attack No date: Hypertension 2019: Lung nodule     Comment:  being followed by dr. Grayland Ormond, dr Genevive Bi and dr. Mortimer Fries 04/2016: Pneumonia No date: PONV (postoperative nausea and vomiting)     Comment:  NAUSEATED 2018: Pulmonary emboli ([place]) No date: Sleep apnea     Comment:  CPAP No date: Vertigo   Reproductive/Obstetrics                              Anesthesia Physical Anesthesia Plan  ASA: 3  Anesthesia Plan: General   Post-op Pain Management: Minimal or no pain anticipated   Induction: Intravenous  PONV Risk Score and Plan: 4 or greater and Ondansetron, Propofol infusion and TIVA  Airway Management Planned: Natural Airway  Additional Equipment: None  Intra-op Plan:   Post-operative Plan:   Informed Consent: I have reviewed the patients History and Physical, chart, labs and discussed the procedure including the risks, benefits and alternatives for the proposed anesthesia with the patient or authorized representative who has indicated his/her understanding and acceptance.     Dental advisory given  Plan Discussed with: CRNA and Anesthesiologist  Anesthesia Plan Comments: (Discussed risks of anesthesia with patient, including possibility of difficulty with spontaneous ventilation under anesthesia necessitating airway intervention, PONV, and rare risks such as cardiac or respiratory or neurological events, and allergic reactions. Discussed the role of CRNA in patient's perioperative care. Patient understands.)         Anesthesia Quick Evaluation

## 2022-04-21 ENCOUNTER — Encounter: Payer: Self-pay | Admitting: Gastroenterology

## 2022-04-21 LAB — SURGICAL PATHOLOGY

## 2022-04-24 ENCOUNTER — Telehealth: Payer: Self-pay

## 2022-04-24 NOTE — Telephone Encounter (Signed)
-----   Message from Lin Landsman, MD sent at 04/24/2022  9:30 AM EST ----- The large polyp removed from the left colon still has some residual polyp left behind.  Therefore, recommend flexible sigmoidoscopy with MiraLAX prep next available to look at the site and remove rest of the polyp  RV

## 2022-04-24 NOTE — Telephone Encounter (Signed)
Called patient daughter and she verbalized understanding but states she does not know her mom work schedule and we needed to call her to schedule the Flexsigmoid. She states her cell phone number is [phone]. Tried to call patient but voicemail is not set up

## 2022-04-24 NOTE — Telephone Encounter (Signed)
Tried to call patient but voicemail is not set up  

## 2022-04-25 NOTE — Telephone Encounter (Signed)
Tried to call patient but voicemail is not set up  

## 2022-04-26 ENCOUNTER — Other Ambulatory Visit: Payer: Self-pay

## 2022-04-26 DIAGNOSIS — Z86010 Personal history of colonic polyps: Secondary | ICD-10-CM

## 2022-04-26 NOTE — Telephone Encounter (Signed)
Tried to call patient but voicemail is not set up. Called daughter and she will tell her mom to call us

## 2022-04-26 NOTE — Telephone Encounter (Signed)
Patient called back and she states she wants this taken care of as soon as we can. Gave her days next week and we schedule for 05/04/2022. Went over instructions and sent to [employer] chart account. She verbalized understanding of instructions

## 2022-05-03 ENCOUNTER — Encounter: Payer: Self-pay | Admitting: Gastroenterology

## 2022-05-04 ENCOUNTER — Ambulatory Visit
Admission: RE | Admit: 2022-05-04 | Discharge: 2022-05-04 | Disposition: A | Discharge status: Home / Self Care | Payer: Managed Care, Other (non HMO) | Source: Ambulatory Visit | Attending: Gastroenterology | Admitting: Gastroenterology

## 2022-05-04 ENCOUNTER — Ambulatory Visit: Payer: Managed Care, Other (non HMO) | Admitting: Anesthesiology

## 2022-05-04 ENCOUNTER — Encounter: Payer: Self-pay | Admitting: Gastroenterology

## 2022-05-04 ENCOUNTER — Encounter
Admission: RE | Disposition: A | Discharge status: Home / Self Care | Payer: Self-pay | Source: Ambulatory Visit | Attending: Gastroenterology

## 2022-05-04 DIAGNOSIS — E119 Type 2 diabetes mellitus without complications: Secondary | ICD-10-CM | POA: Diagnosis not present

## 2022-05-04 DIAGNOSIS — K635 Polyp of colon: Secondary | ICD-10-CM

## 2022-05-04 DIAGNOSIS — K219 Gastro-esophageal reflux disease without esophagitis: Secondary | ICD-10-CM | POA: Diagnosis not present

## 2022-05-04 DIAGNOSIS — Z9049 Acquired absence of other specified parts of digestive tract: Secondary | ICD-10-CM | POA: Diagnosis not present

## 2022-05-04 DIAGNOSIS — J449 Chronic obstructive pulmonary disease, unspecified: Secondary | ICD-10-CM | POA: Diagnosis not present

## 2022-05-04 DIAGNOSIS — D124 Benign neoplasm of descending colon: Secondary | ICD-10-CM | POA: Insufficient documentation

## 2022-05-04 DIAGNOSIS — Z87891 Personal history of nicotine dependence: Secondary | ICD-10-CM | POA: Insufficient documentation

## 2022-05-04 DIAGNOSIS — I10 Essential (primary) hypertension: Secondary | ICD-10-CM | POA: Diagnosis not present

## 2022-05-04 DIAGNOSIS — Z79899 Other long term (current) drug therapy: Secondary | ICD-10-CM | POA: Insufficient documentation

## 2022-05-04 DIAGNOSIS — F410 Panic disorder [episodic paroxysmal anxiety] without agoraphobia: Secondary | ICD-10-CM | POA: Diagnosis not present

## 2022-05-04 DIAGNOSIS — Z86718 Personal history of other venous thrombosis and embolism: Secondary | ICD-10-CM | POA: Insufficient documentation

## 2022-05-04 DIAGNOSIS — I25119 Atherosclerotic heart disease of native coronary artery with unspecified angina pectoris: Secondary | ICD-10-CM | POA: Insufficient documentation

## 2022-05-04 DIAGNOSIS — F32A Depression, unspecified: Secondary | ICD-10-CM | POA: Diagnosis not present

## 2022-05-04 DIAGNOSIS — K633 Ulcer of intestine: Secondary | ICD-10-CM | POA: Insufficient documentation

## 2022-05-04 DIAGNOSIS — Z86711 Personal history of pulmonary embolism: Secondary | ICD-10-CM | POA: Diagnosis not present

## 2022-05-04 DIAGNOSIS — G4730 Sleep apnea, unspecified: Secondary | ICD-10-CM | POA: Insufficient documentation

## 2022-05-04 DIAGNOSIS — Z86010 Personal history of colonic polyps: Secondary | ICD-10-CM | POA: Insufficient documentation

## 2022-05-04 DIAGNOSIS — K449 Diaphragmatic hernia without obstruction or gangrene: Secondary | ICD-10-CM | POA: Diagnosis not present

## 2022-05-04 DIAGNOSIS — Z09 Encounter for follow-up examination after completed treatment for conditions other than malignant neoplasm: Secondary | ICD-10-CM | POA: Diagnosis present

## 2022-05-04 HISTORY — PX: FLEXIBLE SIGMOIDOSCOPY: SHX5431

## 2022-05-04 LAB — GLUCOSE, CAPILLARY: Glucose-Capillary: 91 mg/dL (ref 70–99)

## 2022-05-04 SURGERY — SIGMOIDOSCOPY, FLEXIBLE
Anesthesia: General

## 2022-05-04 MED ORDER — SODIUM CHLORIDE 0.9 % IV SOLN: INTRAVENOUS | Status: DC

## 2022-05-04 MED ORDER — SPOT INK MARKER SYRINGE KIT
PACK | SUBMUCOSAL | Status: DC | PRN
  Administered 2022-05-04: 2 mL via SUBMUCOSAL

## 2022-05-04 MED ORDER — PROPOFOL 10 MG/ML IV BOLUS
INTRAVENOUS | Status: DC | PRN
  Administered 2022-05-04: 50 mg via INTRAVENOUS

## 2022-05-04 MED ORDER — PROPOFOL 500 MG/50ML IV EMUL
INTRAVENOUS | Status: DC | PRN
  Administered 2022-05-04: 150 ug/kg/min via INTRAVENOUS

## 2022-05-04 NOTE — H&P (Signed)
Cephas Darby, MD [address]  [place]  [place], [place] [address]  Main: [phone]  Fax: [phone] Pager: [phone]  Primary Care Physician:  Letta Median, MD Primary Gastroenterologist:  Dr. Cephas Darby  Pre-Procedure History & Physical: HPI:  Andrea Pollard is a 66 y.o. female is here for an flexible sigmoidoscopy.   Past Medical History:  Diagnosis Date   Anxiety    Asthma    Complication of anesthesia    COPD (chronic obstructive pulmonary disease) ([place])    diagnosed from pulmonary function tests   Depression    Diabetes mellitus without complication ([place])    DVT (deep venous thrombosis) ([place])    Dvt femoral (deep venous thrombosis) ([place]) 2018   in lung also.  treated with xarelto for 6 months   GERD (gastroesophageal reflux disease)    Hepatitis    AGE 33-HEPATITIS B   History of hiatal hernia    History of kidney stones 2019   History of methicillin resistant staphylococcus aureus (MRSA) 2013   in neck due to ingrown hair. had surgery to drain cyst   History of palpitations 2019   due to a panic attack   Hypertension    Lung nodule 2019   being followed by dr. Grayland Ormond, dr Genevive Bi and dr. Mortimer Fries   Pneumonia 04/2016   PONV (postoperative nausea and vomiting)    NAUSEATED   Pulmonary emboli ([place]) 2018   Sleep apnea    CPAP   Vertigo     Past Surgical History:  Procedure Laterality Date   ABDOMINAL HYSTERECTOMY  1994   CESAREAN SECTION     CHOLECYSTECTOMY  2013   COLONOSCOPY WITH PROPOFOL N/A 04/20/2022   Procedure: COLONOSCOPY WITH PROPOFOL;  Surgeon: Lin Landsman, MD;  Location: [facility] ENDOSCOPY;  Service: Gastroenterology;  Laterality: N/A;   CYST REMOVAL NECK  2013   was ingrown hair . positive for mrsa at that time prior to removal in [place] URETEROSCOPY AND STENT PLACEMENT Right 05/31/2018   Procedure: CYSTOSCOPY WITH URETEROSCOPY, LASER LITHOTRIPSY AND STENT PLACEMENT;  Surgeon: Billey Co, MD;   Location: [facility] ORS;  Service: Urology;  Laterality: Right;   DIAGNOSTIC LAPAROSCOPY     ELECTROMAGNETIC NAVIGATION BROCHOSCOPY N/A 08/15/2016   Procedure: ELECTROMAGNETIC NAVIGATION BRONCHOSCOPY;  Surgeon: Flora Lipps, MD;  Location: [facility] ORS;  Service: Cardiopulmonary;  Laterality: N/A;   ELECTROMAGNETIC NAVIGATION BROCHOSCOPY Right 11/07/2017   Procedure: ELECTROMAGNETIC NAVIGATION BRONCHOSCOPY;  Surgeon: Flora Lipps, MD;  Location: [facility] ORS;  Service: Cardiopulmonary;  Laterality: Right;   ESOPHAGOGASTRODUODENOSCOPY (EGD) WITH PROPOFOL N/A 04/20/2022   Procedure: ESOPHAGOGASTRODUODENOSCOPY (EGD) WITH PROPOFOL;  Surgeon: Lin Landsman, MD;  Location: [facility] ENDOSCOPY;  Service: Gastroenterology;  Laterality: N/A;   LAPAROSCOPIC GASTRIC SLEEVE RESECTION  2015   lost 75 pounds    Prior to Admission medications   Medication Sig Start Date End Date Taking? Authorizing Provider  albuterol (VENTOLIN HFA) 108 (90 Base) MCG/ACT inhaler Inhale 1-2 puffs into the lungs every 6 (six) hours as needed. 03/30/22  Yes Sharion Balloon, NP  atorvastatin (LIPITOR) 80 MG tablet Take 80 mg by mouth at bedtime.  08/30/16  Yes [provider]  citalopram (CELEXA) 40 MG tablet TK 1 T PO D FOR MOOD   Yes [provider]  lansoprazole (PREVACID) 15 MG capsule Take 15 mg by mouth at bedtime.    Yes [provider]  lisinopril-hydrochlorothiazide (ZESTORETIC) 10-12.5 MG tablet    Yes [provider]  ondansetron (ZOFRAN) 4 MG tablet Take 1 tablet (4 mg total) by mouth every 8 (eight) hours as needed for nausea or vomiting. 04/19/22  Yes Brodrick Curran, Tally Due, MD  traZODone (DESYREL) 150 MG tablet Take 150 mg by mouth at bedtime.   Yes [provider]  empagliflozin (JARDIANCE) 10 MG TABS tablet Take by mouth daily.    [provider]  FLOVENT HFA 220 MCG/ACT inhaler Inhale 2 puffs into the lungs daily.  12/11/16   [provider]  hydrochlorothiazide  (HYDRODIURIL) 25 MG tablet Take 1 tablet by mouth daily.    [provider]  hydrOXYzine (VISTARIL) 25 MG capsule Take 50 mg by mouth 2 (two) times daily.    [provider]  meclizine (ANTIVERT) 25 MG tablet     [provider]  metFORMIN (GLUCOPHAGE) 1000 MG tablet TAKE 1 TABLET BY MOUTH TWICE DAILY WITH BREAKFAST AND DINNER FOR DIABETES Patient not taking: Reported on 05/04/2022    [provider]  montelukast (SINGULAIR) 10 MG tablet TAKE 1 TABLET BY MOUTH DAILY FOR ASTHMA OR ALLERGIES    [provider]  ONETOUCH VERIO test strip TEST ONCE D 09/21/18   Schuyler Amor, MD  OZEMPIC, 0.25 OR 0.5 MG/DOSE, 2 MG/1.5ML SOPN Inject 0.5 mg as directed once a week. 08/19/20   [provider]  sertraline (ZOLOFT) 100 MG tablet Take 100 mg by mouth 3 (three) times daily. 12/03/17   [provider]    Allergies as of 04/26/2022 - Review Complete 04/20/2022  Allergen Reaction Noted   Ibuprofen Other (See Comments) 10/25/2014   Levaquin [levofloxacin in d5w] Hives 10/25/2014   Sulfamethoxazole-trimethoprim Hives 04/09/2014   Nitroglycerin Other (See Comments) 10/25/2014    Family History  Problem Relation Age of Onset   Stroke Father    Ovarian cancer Maternal Grandmother    Breast cancer Neg Hx     Social History   Socioeconomic History   Marital status: Divorced    Spouse name: Not on file   Number of children: Not on file   Years of education: Not on file   Highest education level: Not on file  Occupational History   Not on file  Tobacco Use   Smoking status: Former    Packs/day: 1.00    Years: 5.00    Total pack years: 5.00    Types: Cigarettes    Quit date: 08/12/1991    Years since quitting: 30.7   Smokeless tobacco: Never  Vaping Use   Vaping Use: Never used  Substance and Sexual Activity   Alcohol use: No   Drug use: No   Sexual activity: Not on file  Other Topics Concern   Not on file  Social History  Narrative   Not on file   Social Determinants of Health   Financial Resource Strain: Not on file  Food Insecurity: Not on file  Transportation Needs: Not on file  Physical Activity: Not on file  Stress: Not on file  Social Connections: Not on file  Intimate Partner Violence: Not on file    Review of Systems: See HPI, otherwise negative ROS  Physical Exam: BP (!) 154/97   Pulse 76   Temp (!) 97.3 F (36.3 C) (Temporal)   Resp 16   Wt 91.4 kg   SpO2 99%   BMI 33.55 kg/m  General:   Alert,  pleasant and cooperative in NAD Head:  Normocephalic and atraumatic. Neck:  Supple; no masses or thyromegaly. Lungs:  Clear throughout to auscultation.    Heart:  Regular rate and rhythm. Abdomen:  Soft, nontender and nondistended. Normal bowel sounds, without guarding, and without rebound.   Neurologic:  Alert and  oriented x4;  grossly normal neurologically.  Impression/Plan: Andrea Pollard is here for an flexible sigmoidoscopy to be performed for follow up of descending colon residual polyp  Risks, benefits, limitations, and alternatives regarding  flexible sigmoidoscopy have been reviewed with the patient.  Questions have been answered.  All parties agreeable.   Sherri Sear, MD  05/04/2022, 10:10 AM

## 2022-05-04 NOTE — Transfer of Care (Addendum)
Immediate Anesthesia Transfer of Care Note  Patient: Andrea Pollard  Procedure(s) Performed: FLEXIBLE SIGMOIDOSCOPY  Patient Location: PACU and Endoscopy Unit  Anesthesia Type:General  Level of Consciousness: awake, drowsy, and patient cooperative  Airway & Oxygen Therapy: Patient Spontanous Breathing  Post-op Assessment: Report given to RN and Post -op Vital signs reviewed and stable  Post vital signs: Reviewed and stable  Last Vitals:  Vitals Value Taken Time  BP 101/49 05/04/22 1133  Temp    Pulse 77 05/04/22 1134  Resp 21 05/04/22 1134  SpO2 99 % 05/04/22 1134  Vitals shown include unvalidated device data.  Last Pain:  Vitals:   05/04/22 1131  TempSrc:   PainSc: 0-No pain         Complications: No notable events documented.

## 2022-05-04 NOTE — Op Note (Signed)
[facility] Gastroenterology Patient Name: Andrea Pollard Procedure Date: 05/04/2022 10:53 AM MRN: [id number] Account #: [id number] Date of Birth: 01-17-1956 Admit Type: Outpatient Age: 66 Room: [facility] ENDO ROOM 4 Gender: Female Note Status: Finalized Instrument Name: Peds Colonoscope [id number] Procedure:             Flexible Sigmoidoscopy Indications:           Personal history of colonic polyps Providers:             Lin Landsman MD, MD Referring MD:          Baxter Kail. Rebeca Alert MD, MD (Referring MD) Medicines:             General Anesthesia Complications:         No immediate complications. Estimated blood loss: None. Procedure:             Pre-Anesthesia Assessment:                        - Prior to the procedure, a History and Physical was                         performed, and patient medications and allergies were                         reviewed. The patient is competent. The risks and                         benefits of the procedure and the sedation options and                         risks were discussed with the patient. All questions                         were answered and informed consent was obtained.                         Patient identification and proposed procedure were                         verified by the physician, the nurse, the                         anesthesiologist, the anesthetist and the technician                         in the pre-procedure area in the procedure room in the                         endoscopy suite. Mental Status Examination: alert and                         oriented. Airway Examination: normal oropharyngeal                         airway and neck mobility. Respiratory Examination:                         clear to auscultation. CV Examination: normal.  Prophylactic Antibiotics: The patient does not require                         prophylactic antibiotics. Prior Anticoagulants: The                          patient has taken no anticoagulant or antiplatelet                         agents. ASA Grade Assessment: III - A patient with                         severe systemic disease. After reviewing the risks and                         benefits, the patient was deemed in satisfactory                         condition to undergo the procedure. The anesthesia                         plan was to use general anesthesia. Immediately prior                         to administration of medications, the patient was                         re-assessed for adequacy to receive sedatives. The                         heart rate, respiratory rate, oxygen saturations,                         blood pressure, adequacy of pulmonary ventilation, and                         response to care were monitored throughout the                         procedure. The physical status of the patient was                         re-assessed after the procedure.                        After obtaining informed consent, the scope was passed                         under direct vision. The Colonoscope was introduced                         through the anus and advanced to the the splenic                         flexure. The flexible sigmoidoscopy was accomplished                         without difficulty. The patient tolerated the  procedure well. The quality of the bowel preparation                         was adequate. Findings:      The perianal and digital rectal examinations were normal. Pertinent       negatives include normal sphincter tone and no palpable rectal lesions.      A single (solitary) six mm post polypectomy ulcer with residual polyp       was found in the descending colon. No bleeding was present. No stigmata       of recent bleeding were seen.      A 5 mm residual polyp was found in the descending colon at the previous       polypectomy site. The polyp was sessile. The polyp  was removed with a       cold snare. Resection and retrieval were complete. Area was tattooed       with an injection of Spot (carbon black). Impression:            - A single (solitary) ulcer in the descending colon.                         Tattooed.                        - One 5 mm polyp in the descending colon, removed with                         a cold snare. Resected and retrieved. Tattooed. Recommendation:        - Discharge patient to home (with escort).                        - Resume previous diet today.                        - Await pathology results.                        - Perform a colonoscopy in 1-3 years based on the                         pathology results.                        - Continue present medications. Procedure Code(s):     --- Professional ---                        [phone], Sigmoidoscopy, flexible; with removal of                         tumor(s), polyp(s), or other lesion(s) by snare                         technique                        [id number], Sigmoidoscopy, flexible; with directed                         submucosal injection(s), any substance Diagnosis Code(s):     ---  Professional ---                        K63.3, Ulcer of intestine                        D12.4, Benign neoplasm of descending colon                        Z86.010, Personal history of colonic polyps CPT copyright 2022 [employer]. All rights reserved. The codes documented in this report are preliminary and upon coder review may  be revised to meet current compliance requirements. Dr. Ulyess Mort Lin Landsman MD, MD 05/04/2022 11:34:32 AM This report has been signed electronically. Number of Addenda: 0 Note Initiated On: 05/04/2022 10:53 AM Total Procedure Duration: 0 hours 16 minutes 28 seconds  Estimated Blood Loss:  Estimated blood loss: none.      [facility]: Lambert Rhonda W

## 2022-05-04 NOTE — Anesthesia Preprocedure Evaluation (Signed)
Anesthesia Evaluation  Patient identified by MRN, date of birth, ID band Patient awake    Reviewed: Allergy & Precautions, H&P , NPO status , Patient's Chart, lab work & pertinent test results, reviewed documented beta blocker date and time   History of Anesthesia Complications (+) PONV and history of anesthetic complications  Airway Mallampati: II   Neck ROM: full    Dental  (+) Poor Dentition   Pulmonary asthma , sleep apnea and Continuous Positive Airway Pressure Ventilation , pneumonia, COPD,  COPD inhaler, former smoker   Pulmonary exam normal        Cardiovascular Exercise Tolerance: Poor hypertension, On Medications + angina with exertion + CAD  Normal cardiovascular exam Rhythm:regular Rate:Normal     Neuro/Psych  PSYCHIATRIC DISORDERS Anxiety Depression     Neuromuscular disease    GI/Hepatic hiatal hernia,GERD  ,,(+) Hepatitis -  Endo/Other  negative endocrine ROSdiabetes, Well Controlled    Renal/GU negative Renal ROS  negative genitourinary   Musculoskeletal   Abdominal   Peds  Hematology negative hematology ROS (+)   Anesthesia Other Findings Past Medical History: No date: Anxiety No date: Asthma No date: Complication of anesthesia No date: COPD (chronic obstructive pulmonary disease) (HCC)     Comment:  diagnosed from pulmonary function tests No date: Depression No date: Diabetes mellitus without complication (HCC) No date: DVT (deep venous thrombosis) ([place]) 2018: Dvt femoral (deep venous thrombosis) ([place])     Comment:  in lung also.  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 had surgery to drain cyst 2019: History of  palpitations     Comment:  due to a panic attack No date: Hypertension 2019: Lung nodule     Comment:  being followed by dr. Grayland Ormond, dr Genevive Bi and dr. Mortimer Fries 04/2016: Pneumonia No date: PONV (postoperative nausea and vomiting)     Comment:  NAUSEATED 2018: Pulmonary emboli ([place]) No date: Sleep apnea     Comment:  CPAP No date: Vertigo Past Surgical History: 1994: ABDOMINAL HYSTERECTOMY No date: CESAREAN SECTION 2013: CHOLECYSTECTOMY 04/20/2022: COLONOSCOPY WITH PROPOFOL; N/A     Comment:  Procedure: COLONOSCOPY WITH PROPOFOL;  Surgeon: Lin Landsman, MD;  Location: [facility] ENDOSCOPY;  Service:               Gastroenterology;  Laterality: N/A; 2013: CYST REMOVAL NECK     Comment:  was ingrown hair . positive for mrsa at that time prior               to removal in OR 05/31/2018: CYSTOSCOPY WITH URETEROSCOPY AND STENT PLACEMENT; Right     Comment:  Procedure: CYSTOSCOPY WITH URETEROSCOPY, LASER               LITHOTRIPSY AND STENT PLACEMENT;  Surgeon: Billey Co, MD;  Location: [facility] ORS;  Service: Urology;                Laterality: Right; No date: DIAGNOSTIC LAPAROSCOPY 08/15/2016: ELECTROMAGNETIC NAVIGATION BROCHOSCOPY; N/A     Comment:  Procedure: ELECTROMAGNETIC NAVIGATION BRONCHOSCOPY;  Surgeon: Flora Lipps, MD;  Location: [facility] ORS;  Service:               Cardiopulmonary;  Laterality: N/A; 11/07/2017: ELECTROMAGNETIC NAVIGATION BROCHOSCOPY; Right     Comment:  Procedure: ELECTROMAGNETIC NAVIGATION BRONCHOSCOPY;                Surgeon: Flora Lipps, MD;  Location: [facility] ORS;  Service:              Cardiopulmonary;  Laterality: Right; 04/20/2022: ESOPHAGOGASTRODUODENOSCOPY (EGD) WITH PROPOFOL; N/A     Comment:  Procedure: ESOPHAGOGASTRODUODENOSCOPY (EGD) WITH               PROPOFOL;  Surgeon: Lin Landsman, MD;  Location:               [facility] ENDOSCOPY;  Service: Gastroenterology;  Laterality:               N/A; 2015:  LAPAROSCOPIC GASTRIC SLEEVE RESECTION     Comment:  lost 75 pounds BMI    Body Mass Index: 33.55 kg/m     Reproductive/Obstetrics negative OB ROS                             Anesthesia Physical Anesthesia Plan  ASA: 4  Anesthesia Plan: General   Post-op Pain Management:    Induction:   PONV Risk Score and Plan:   Airway Management Planned:   Additional Equipment:   Intra-op Plan:   Post-operative Plan:   Informed Consent: I have reviewed the patients History and Physical, chart, labs and discussed the procedure including the risks, benefits and alternatives for the proposed anesthesia with the patient or authorized representative who has indicated his/her understanding and acceptance.     Dental Advisory Given  Plan Discussed with: CRNA  Anesthesia Plan Comments:        Anesthesia Quick Evaluation

## 2022-05-04 NOTE — Anesthesia Procedure Notes (Signed)
Procedure Name: MAC Date/Time: 05/04/2022 11:06 AM  Performed by: Jerrye Noble, CRNAPre-anesthesia Checklist: Patient identified, Emergency Drugs available, Suction available and Patient being monitored Patient Re-evaluated:Patient Re-evaluated prior to induction Oxygen Delivery Method: Nasal cannula

## 2022-05-05 ENCOUNTER — Encounter: Payer: Self-pay | Admitting: Gastroenterology

## 2022-05-05 LAB — SURGICAL PATHOLOGY

## 2022-05-07 NOTE — Anesthesia Postprocedure Evaluation (Signed)
Anesthesia Post Note  Patient: Andrea Pollard  Procedure(s) Performed: [place]  Patient location during evaluation: PACU Anesthesia Type: General Level of consciousness: awake and alert Pain management: pain level controlled Vital Signs Assessment: post-procedure vital signs reviewed and stable Respiratory status: spontaneous breathing, nonlabored ventilation, respiratory function stable and patient connected to nasal cannula oxygen Cardiovascular status: blood pressure returned to baseline and stable Postop Assessment: no apparent nausea or vomiting Anesthetic complications: no   No notable events documented.   Last Vitals:  Vitals:   05/04/22 1133 05/04/22 1200  BP: (!) 105/49 (!) 146/73  Pulse:    Resp:    Temp: (!) 36.3 C   SpO2:      Last Pain:  Vitals:   05/04/22 1200  TempSrc:   PainSc: 0-No pain                 Molli Barrows

## 2022-05-08 ENCOUNTER — Encounter: Payer: Self-pay | Admitting: Gastroenterology

## 2022-05-08 ENCOUNTER — Telehealth: Payer: Self-pay

## 2022-05-08 DIAGNOSIS — E1100 Type 2 diabetes mellitus with hyperosmolarity without nonketotic hyperglycemic-hyperosmolar coma (NKHHC): Secondary | ICD-10-CM

## 2022-05-08 DIAGNOSIS — K529 Noninfective gastroenteritis and colitis, unspecified: Secondary | ICD-10-CM

## 2022-05-08 NOTE — Telephone Encounter (Signed)
Recommend to check pancreatic fecal elastase levels for chronic diarrhea Check GI profile PCR for chronic diarrhea Check hemoglobin A1c for history of diabetes Trial of Bentyl 10 mg before each meal and at bedtime as needed, 30 pills only Have patient discuss with her PCP regarding discontinuation of metformin or reducing the dose of which can lead to GI symptoms  Andrea Pollard

## 2022-05-08 NOTE — Telephone Encounter (Signed)
Patient is having upper abdominal pain states is severe. The pain is a cramping like pain and is all day long. Patient states she has been having nausea but denies any vomiting. Patient states every time she eats she has diarrhea. She states this is nothing new but she needs some help. She states the pain is getting worse. She has not tried anything over the counter for the pain or diarrhea

## 2022-05-09 MED ORDER — DICYCLOMINE HCL 10 MG PO CAPS: 10.0000 mg | ORAL_CAPSULE | Freq: Three times a day (TID) | ORAL | 0 refills | Status: DC

## 2022-05-09 NOTE — Telephone Encounter (Signed)
Patient left a voicemail on the front office for call back. Returned patient call but unable to leave a voicemail because voicemail is not set up

## 2022-05-09 NOTE — Telephone Encounter (Signed)
Order blood work and stool test. Sent medication to the pharmacy. Tried to call patient but voicemail is not set up

## 2022-05-09 NOTE — Addendum Note (Signed)
Addended by: Ulyess Blossom L on: 05/09/2022 08:26 AM   Modules accepted: Orders

## 2022-05-09 NOTE — Telephone Encounter (Signed)
Patient verbalized understanding of instructions. She states she will come this week for the blood work and get the stool kits. She states she will try the medication to see if it helps. She states she does not take the Metformin any more she states she only takes the Ozempic once a week now

## 2022-05-10 ENCOUNTER — Telehealth: Payer: Self-pay

## 2022-05-10 NOTE — Telephone Encounter (Signed)
Not sure how long she is on Ozempic.  Ozempic can lead to GI side effects which she has been experiencing.  Please have her discuss with her primary care provider if she can hold Ozempic until her GI symptoms improve  RV

## 2022-05-10 NOTE — Telephone Encounter (Signed)
Patient left a voicemail crying. She states she is really nausea and is in really bad abdominal pain. She states she needs some help. Patient would really like to talk to you she said. She said to call her on her work phone [phone]

## 2022-05-11 NOTE — Telephone Encounter (Signed)
Patient verbalized understanding of instructions  

## 2022-05-11 NOTE — Telephone Encounter (Signed)
Tried to call patient but voicemail is not set up yet. Called patient work and left  a message for call back

## 2022-05-17 LAB — HEMOGLOBIN A1C
Est. average glucose Bld gHb Est-mCnc: 114 mg/dL
Hgb A1c MFr Bld: 5.6 % (ref 4.8–5.6)

## 2022-05-19 LAB — GI PROFILE, STOOL, PCR

## 2022-05-19 LAB — PANCREATIC ELASTASE, FECAL: Pancreatic Elastase, Fecal: 409 ug Elast./g (ref >200)

## 2022-05-25 ENCOUNTER — Encounter: Payer: Self-pay | Admitting: Gastroenterology

## 2022-07-07 ENCOUNTER — Other Ambulatory Visit: Payer: Self-pay

## 2022-07-10 ENCOUNTER — Ambulatory Visit: Payer: Managed Care, Other (non HMO) | Admitting: Gastroenterology

## 2022-07-10 ENCOUNTER — Encounter: Payer: Self-pay | Admitting: Gastroenterology

## 2022-08-08 ENCOUNTER — Ambulatory Visit: Payer: Managed Care, Other (non HMO) | Admitting: Gastroenterology

## 2022-08-15 ENCOUNTER — Telehealth: Payer: Self-pay

## 2022-08-15 NOTE — Telephone Encounter (Signed)
Pt would like an appointment when June schedule opens

## 2022-09-14 ENCOUNTER — Observation Stay: Payer: Managed Care, Other (non HMO)

## 2022-09-14 ENCOUNTER — Emergency Department: Payer: Managed Care, Other (non HMO)

## 2022-09-14 ENCOUNTER — Encounter: Payer: Self-pay | Admitting: Obstetrics and Gynecology

## 2022-09-14 ENCOUNTER — Observation Stay
Admission: EM | Admit: 2022-09-14 | Discharge: 2022-09-15 | Disposition: A | Discharge status: Home Health | Payer: Managed Care, Other (non HMO) | Attending: Obstetrics and Gynecology | Admitting: Obstetrics and Gynecology

## 2022-09-14 ENCOUNTER — Other Ambulatory Visit: Payer: Self-pay

## 2022-09-14 DIAGNOSIS — R270 Ataxia, unspecified: Secondary | ICD-10-CM | POA: Diagnosis not present

## 2022-09-14 DIAGNOSIS — I10 Essential (primary) hypertension: Secondary | ICD-10-CM | POA: Diagnosis not present

## 2022-09-14 DIAGNOSIS — E119 Type 2 diabetes mellitus without complications: Secondary | ICD-10-CM | POA: Diagnosis not present

## 2022-09-14 DIAGNOSIS — I25118 Atherosclerotic heart disease of native coronary artery with other forms of angina pectoris: Secondary | ICD-10-CM | POA: Diagnosis present

## 2022-09-14 DIAGNOSIS — Z86718 Personal history of other venous thrombosis and embolism: Secondary | ICD-10-CM | POA: Diagnosis not present

## 2022-09-14 DIAGNOSIS — I2699 Other pulmonary embolism without acute cor pulmonale: Secondary | ICD-10-CM | POA: Diagnosis present

## 2022-09-14 DIAGNOSIS — Z7984 Long term (current) use of oral hypoglycemic drugs: Secondary | ICD-10-CM | POA: Diagnosis not present

## 2022-09-14 DIAGNOSIS — E669 Obesity, unspecified: Secondary | ICD-10-CM | POA: Diagnosis present

## 2022-09-14 DIAGNOSIS — J45909 Unspecified asthma, uncomplicated: Secondary | ICD-10-CM | POA: Insufficient documentation

## 2022-09-14 DIAGNOSIS — Z87891 Personal history of nicotine dependence: Secondary | ICD-10-CM | POA: Insufficient documentation

## 2022-09-14 DIAGNOSIS — J449 Chronic obstructive pulmonary disease, unspecified: Secondary | ICD-10-CM | POA: Insufficient documentation

## 2022-09-14 DIAGNOSIS — Z6830 Body mass index (BMI) 30.0-30.9, adult: Secondary | ICD-10-CM | POA: Insufficient documentation

## 2022-09-14 DIAGNOSIS — Z79899 Other long term (current) drug therapy: Secondary | ICD-10-CM | POA: Diagnosis not present

## 2022-09-14 DIAGNOSIS — R2689 Other abnormalities of gait and mobility: Secondary | ICD-10-CM | POA: Diagnosis present

## 2022-09-14 DIAGNOSIS — G4733 Obstructive sleep apnea (adult) (pediatric): Secondary | ICD-10-CM | POA: Diagnosis present

## 2022-09-14 LAB — COMPREHENSIVE METABOLIC PANEL
ALT: 18 U/L (ref 0–44)
AST: 30 U/L (ref 15–41)
Albumin: 3.9 g/dL (ref 3.5–5.0)
Alkaline Phosphatase: 84 U/L (ref 38–126)
Anion gap: 8 (ref 5–15)
BUN: 15 mg/dL (ref 8–23)
CO2: 26 mmol/L (ref 22–32)
Calcium: 8.7 mg/dL — ABNORMAL LOW (ref 8.9–10.3)
Chloride: 105 mmol/L (ref 98–111)
Creatinine, Ser: 0.66 mg/dL (ref 0.44–1.00)
GFR, Estimated: >60 mL/min (ref >60)
Glucose, Bld: 96 mg/dL (ref 70–99)
Potassium: 4 mmol/L (ref 3.5–5.1)
Sodium: 139 mmol/L (ref 135–145)
Total Bilirubin: 0.9 mg/dL (ref 0.3–1.2)
Total Protein: 7 g/dL (ref 6.5–8.1)

## 2022-09-14 LAB — DIFFERENTIAL
Abs Immature Granulocytes: 0.01 10*3/uL (ref 0.00–0.07)
Basophils Absolute: 0.1 10*3/uL (ref 0.0–0.1)
Basophils Relative: 1 %
Eosinophils Absolute: 0.2 10*3/uL (ref 0.0–0.5)
Eosinophils Relative: 3 %
Immature Granulocytes: 0 %
Lymphocytes Relative: 28 %
Lymphs Abs: 1.5 10*3/uL (ref 0.7–4.0)
Monocytes Absolute: 0.3 10*3/uL (ref 0.1–1.0)
Monocytes Relative: 6 %
Neutro Abs: 3.2 10*3/uL (ref 1.7–7.7)
Neutrophils Relative %: 62 %

## 2022-09-14 LAB — LIPID PANEL
Cholesterol: 159 mg/dL (ref 0–200)
HDL: 57 mg/dL (ref >40)
LDL Cholesterol: 86 mg/dL (ref 0–99)
Total CHOL/HDL Ratio: 2.8 RATIO
Triglycerides: 82 mg/dL (ref <150)
VLDL: 16 mg/dL (ref 0–40)

## 2022-09-14 LAB — CBC
HCT: 42.3 % (ref 36.0–46.0)
Hemoglobin: 13.7 g/dL (ref 12.0–15.0)
MCH: 25.9 pg — ABNORMAL LOW (ref 26.0–34.0)
MCHC: 32.4 g/dL (ref 30.0–36.0)
MCV: 80.1 fL (ref 80.0–100.0)
Platelets: 165 10*3/uL (ref 150–400)
RBC: 5.28 MIL/uL — ABNORMAL HIGH (ref 3.87–5.11)
RDW: 13.7 % (ref 11.5–15.5)
WBC: 5.2 10*3/uL (ref 4.0–10.5)
nRBC: 0 % (ref 0.0–0.2)

## 2022-09-14 LAB — PROTIME-INR
INR: 1.1 (ref 0.8–1.2)
Prothrombin Time: 13.9 seconds (ref 11.4–15.2)

## 2022-09-14 LAB — TSH: TSH: 2.318 u[IU]/mL (ref 0.350–4.500)

## 2022-09-14 LAB — CBG MONITORING, ED: Glucose-Capillary: 84 mg/dL (ref 70–99)

## 2022-09-14 LAB — APTT: aPTT: 26 seconds (ref 24–36)

## 2022-09-14 MED ORDER — SODIUM CHLORIDE 0.9% FLUSH: 3.0000 mL | INTRAVENOUS | Status: DC | PRN

## 2022-09-14 MED ORDER — SODIUM CHLORIDE 0.9% FLUSH
3.0000 mL | Freq: Two times a day (BID) | INTRAVENOUS | Status: DC
  Administered 2022-09-14: 3 mL via INTRAVENOUS

## 2022-09-14 MED ORDER — ACETAMINOPHEN 500 MG PO TABS
1000.0000 mg | ORAL_TABLET | Freq: Four times a day (QID) | ORAL | Status: DC | PRN
  Administered 2022-09-14: 1000 mg via ORAL
  Filled 2022-09-14: qty 2

## 2022-09-14 MED ORDER — ACETAMINOPHEN 500 MG PO TABS: 1000.0000 mg | ORAL_TABLET | Freq: Once | ORAL | Status: DC

## 2022-09-14 MED ORDER — METOCLOPRAMIDE HCL 10 MG PO TABS
10.0000 mg | ORAL_TABLET | Freq: Once | ORAL | Status: AC
  Administered 2022-09-14: 10 mg via ORAL
  Filled 2022-09-14: qty 1

## 2022-09-14 MED ORDER — SODIUM CHLORIDE 0.9 % IV SOLN: 250.0000 mL | INTRAVENOUS | Status: DC | PRN

## 2022-09-14 MED ORDER — ENOXAPARIN SODIUM 40 MG/0.4ML IJ SOSY
40.0000 mg | PREFILLED_SYRINGE | INTRAMUSCULAR | Status: DC
  Filled 2022-09-14: qty 0.4

## 2022-09-14 MED ORDER — ACETAMINOPHEN 500 MG PO TABS
1000.0000 mg | ORAL_TABLET | Freq: Once | ORAL | Status: AC
  Administered 2022-09-14: 1000 mg via ORAL
  Filled 2022-09-14: qty 2

## 2022-09-14 NOTE — H&P (Signed)
History and Physical    Andrea Pollard:[id number] DOB: July 11, 1955 DOA: 09/14/2022  PCP: Oswaldo Conroy, MD  Patient coming from: home   Chief Complaint: imbalance  HPI: Andrea Pollard is a 67 y.o. female with medical history significant for obesity, osa, pe/dvt, gatsric bypass, who present with the above.  Symptoms began insidiously 1-2 weeks ago. Characterized by imbalance. No lightheadedness or syncope, no vertigo, no nausea, no med changes, no chest pain or palpitations. When up and moving around a sensation of imbalance. It has slowly worsened such that today she fell landing on her bottom. Has some mild hip pain, no head trauma. In the ED struggled to ambulated for EDP so decision made to admit for further evaluation. Denies toxic habits. No prior hx stroke or similar symptoms.    Review of Systems: As per HPI otherwise 10 point review of systems negative.    Past Medical History:  Diagnosis Date   Anxiety    Asthma    Complication of anesthesia    COPD (chronic obstructive pulmonary disease) (HCC)    diagnosed from pulmonary function tests   Depression    Diabetes mellitus without complication (HCC)    DVT (deep venous thrombosis) (HCC)    Dvt femoral (deep venous thrombosis) (HCC) 2018   in lung also.  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 had surgery to drain cyst   History of palpitations 2019   due to a panic attack   Hypertension    Lung nodule 2019   being followed by dr. Orlie Dakin, dr Thelma Barge and dr. Belia Heman   Pneumonia 04/2016   PONV (postoperative nausea and vomiting)    NAUSEATED   Pulmonary emboli (HCC) 2018   Sleep apnea    CPAP   Vertigo     Past Surgical History:  Procedure Laterality Date   ABDOMINAL  HYSTERECTOMY  1994   CESAREAN SECTION     CHOLECYSTECTOMY  2013   COLONOSCOPY WITH PROPOFOL N/A 04/20/2022   Procedure: COLONOSCOPY WITH PROPOFOL;  Surgeon: Toney Reil, MD;  Location: [facility] ENDOSCOPY;  Service: Gastroenterology;  Laterality: N/A;   CYST REMOVAL NECK  2013   was ingrown hair . positive for mrsa at that time prior to removal in OR   CYSTOSCOPY WITH URETEROSCOPY AND STENT PLACEMENT Right 05/31/2018   Procedure: CYSTOSCOPY WITH URETEROSCOPY, LASER LITHOTRIPSY AND STENT PLACEMENT;  Surgeon: Sondra Come, MD;  Location: [facility] ORS;  Service: Urology;  Laterality: Right;   DIAGNOSTIC LAPAROSCOPY     ELECTROMAGNETIC NAVIGATION BROCHOSCOPY N/A 08/15/2016   Procedure: ELECTROMAGNETIC NAVIGATION BRONCHOSCOPY;  Surgeon: Erin Fulling, MD;  Location: [facility] ORS;  Service: Cardiopulmonary;  Laterality: N/A;   ELECTROMAGNETIC NAVIGATION BROCHOSCOPY Right 11/07/2017   Procedure: ELECTROMAGNETIC NAVIGATION BRONCHOSCOPY;  Surgeon: Erin Fulling, MD;  Location: [facility] ORS;  Service: Cardiopulmonary;  Laterality: Right;   ESOPHAGOGASTRODUODENOSCOPY (EGD) WITH PROPOFOL N/A 04/20/2022   Procedure: ESOPHAGOGASTRODUODENOSCOPY (EGD) WITH PROPOFOL;  Surgeon: Toney Reil, MD;  Location: [facility] ENDOSCOPY;  Service: Gastroenterology;  Laterality: N/A;   FLEXIBLE SIGMOIDOSCOPY N/A 05/04/2022   Procedure: FLEXIBLE SIGMOIDOSCOPY;  Surgeon: Toney Reil, MD;  Location: [facility] ENDOSCOPY;  Service: Gastroenterology;  Laterality: N/A;   LAPAROSCOPIC GASTRIC SLEEVE RESECTION  2015  lost 75 pounds     reports that she quit smoking about 31 years ago. Her smoking use included cigarettes. She has a 5.00 pack-year smoking history. She has never used smokeless tobacco. She reports that she does not drink alcohol and does not use drugs.  Allergies  Allergen Reactions   Ibuprofen Other (See Comments)    Cannot Use due to Gastric SLEEVE SURGERY   Levaquin [Levofloxacin In D5w] Hives    Sulfamethoxazole-Trimethoprim Hives   Nitroglycerin Other (See Comments)    Severe Hypotension    Family History  Problem Relation Age of Onset   Stroke Father    Ovarian cancer Maternal Grandmother    Breast cancer Neg Hx     Prior to Admission medications   Medication Sig Start Date End Date Taking? Authorizing Provider  albuterol (VENTOLIN HFA) 108 (90 Base) MCG/ACT inhaler Inhale 1-2 puffs into the lungs every 6 (six) hours as needed. 03/30/22   Mickie Bail, NP  atorvastatin (LIPITOR) 80 MG tablet Take 80 mg by mouth at bedtime.  08/30/16   [provider]  citalopram (CELEXA) 40 MG tablet Take 40 mg by mouth daily.    [provider]  dicyclomine (BENTYL) 10 MG capsule Take 1 capsule (10 mg total) by mouth 4 (four) times daily -  before meals and at bedtime. 05/09/22   Toney Reil, MD  empagliflozin (JARDIANCE) 10 MG TABS tablet Take 10 mg by mouth daily.    [provider]  FLOVENT HFA 220 MCG/ACT inhaler Inhale 2 puffs into the lungs daily.  12/11/16   [provider]  hydrochlorothiazide (HYDRODIURIL) 25 MG tablet Take 1 tablet by mouth daily.    [provider]  hydrOXYzine (VISTARIL) 25 MG capsule Take 50 mg by mouth 2 (two) times daily.    [provider]  lansoprazole (PREVACID) 15 MG capsule Take 15 mg by mouth at bedtime.     [provider]  lisinopril (ZESTRIL) 10 MG tablet Take 10 mg by mouth daily. 04/28/22   [provider]  meclizine (ANTIVERT) 25 MG tablet     [provider]  metFORMIN (GLUCOPHAGE) 1000 MG tablet TAKE 1 TABLET BY MOUTH TWICE DAILY WITH BREAKFAST AND DINNER FOR DIABETES Patient not taking: Reported on 05/04/2022    [provider]  montelukast (SINGULAIR) 10 MG tablet Take 10 mg by mouth daily.    [provider]  ondansetron (ZOFRAN) 4 MG tablet Take 1 tablet (4 mg total) by mouth every 8 (eight) hours as needed for nausea or vomiting. 04/19/22    Toney Reil, MD  [facility] VERIO test strip TEST ONCE D 09/21/18   Jeanmarie Plant, MD  OZEMPIC, 0.25 OR 0.5 MG/DOSE, 2 MG/1.5ML SOPN Inject 0.5 mg as directed once a week. Patient not taking: Reported on 05/04/2022 08/19/20   [provider]  sertraline (ZOLOFT) 100 MG tablet Take 100 mg by mouth 3 (three) times daily. 12/03/17   [provider]  traZODone (DESYREL) 150 MG tablet Take 150 mg by mouth at bedtime.    [provider]    Physical Exam: Vitals:   09/14/22 1142 09/14/22 1145 09/14/22 1600 09/14/22 1609  BP: 139/80  106/71   Pulse: 82  77   Resp: 20  18   Temp: 98.1 F (36.7 C)   98.1 F (36.7 C)  TempSrc:    Oral  SpO2: 95%  94%   Weight:  90.3 kg    Height:  5'  5" (1.651 m)      Constitutional: No acute distress Head: Atraumatic Eyes: Conjunctiva clear ENM: Moist mucous membranes. Normal dentition.  Neck: Supple Respiratory: Clear to auscultation bilaterally, no wheezing/rales/rhonchi. Normal respiratory effort. No accessory muscle use. . Cardiovascular: Regular rate and rhythm. No murmurs/rubs/gallops. Abdomen: Non-tender, non-distended. No masses. No rebound or guarding. Positive bowel sounds. Musculoskeletal: No joint deformity upper and lower extremities. Normal ROM, no contractures. Normal muscle tone.  Skin: No rashes, lesions, or ulcers.  Extremities: No peripheral edema. Palpable peripheral pulses. Neurologic: Alert, moving all 4 extremities. Cn 2-12 grossly intact Psychiatric: Normal insight and judgement.   Labs on Admission: I have personally reviewed following labs and imaging studies  CBC: Recent Labs  Lab 09/14/22 1152  WBC 5.2  NEUTROABS 3.2  HGB 13.7  HCT 42.3  MCV 80.1  PLT 165   Basic Metabolic Panel: Recent Labs  Lab 09/14/22 1152  NA 139  K 4.0  CL 105  CO2 26  GLUCOSE 96  BUN 15  CREATININE 0.66  CALCIUM 8.7*   GFR: Estimated Creatinine Clearance: 78.8 mL/min (by C-G formula based on SCr  of 0.66 mg/dL). Liver Function Tests: Recent Labs  Lab 09/14/22 1152  AST 30  ALT 18  ALKPHOS 84  BILITOT 0.9  PROT 7.0  ALBUMIN 3.9   No results for input(s): "LIPASE", "AMYLASE" in the last 168 hours. No results for input(s): "AMMONIA" in the last 168 hours. Coagulation Profile: Recent Labs  Lab 09/14/22 1152  INR 1.1   Cardiac Enzymes: No results for input(s): "CKTOTAL", "CKMB", "CKMBINDEX", "TROPONINI" in the last 168 hours. BNP (last 3 results) No results for input(s): "PROBNP" in the last 8760 hours. HbA1C: No results for input(s): "HGBA1C" in the last 72 hours. CBG: Recent Labs  Lab 09/14/22 1147  GLUCAP 84   Lipid Profile: No results for input(s): "CHOL", "HDL", "LDLCALC", "TRIG", "CHOLHDL", "LDLDIRECT" in the last 72 hours. Thyroid Function Tests: No results for input(s): "TSH", "T4TOTAL", "FREET4", "T3FREE", "THYROIDAB" in the last 72 hours. Anemia Panel: No results for input(s): "VITAMINB12", "FOLATE", "FERRITIN", "TIBC", "IRON", "RETICCTPCT" in the last 72 hours. Urine analysis:    Component Value Date/Time   COLORURINE YELLOW (A) 09/13/2021 1523   APPEARANCEUR CLOUDY (A) 09/13/2021 1523   APPEARANCEUR Clear 05/28/2018 1602   LABSPEC 1.026 09/13/2021 1523   LABSPEC 1.038 06/04/2013 1933   PHURINE 5.0 09/13/2021 1523   GLUCOSEU >=500 (A) 09/13/2021 1523   GLUCOSEU >=500 06/04/2013 1933   HGBUR MODERATE (A) 09/13/2021 1523   BILIRUBINUR NEGATIVE 09/13/2021 1523   BILIRUBINUR Negative 05/28/2018 1602   BILIRUBINUR Negative 04/24/2013 1319   KETONESUR NEGATIVE 09/13/2021 1523   PROTEINUR NEGATIVE 09/13/2021 1523   NITRITE NEGATIVE 09/13/2021 1523   LEUKOCYTESUR LARGE (A) 09/13/2021 1523   LEUKOCYTESUR Negative 06/04/2013 1933    Radiological Exams on Admission: DG Hip Unilat W or Wo Pelvis 2-3 Views Right  Result Date: 09/14/2022 CLINICAL DATA:  Pain after fall EXAM: DG HIP (WITH OR WITHOUT PELVIS) 3V RIGHT COMPARISON:  None Available. FINDINGS:  No fracture or dislocation. Preserved joint spaces and bone mineralization. Mild sclerosis along the sacroiliac joints. Mild hyperostosis. Scattered vascular calcifications. IMPRESSION: Mild degenerative changes.  No acute osseous abnormality Electronically Signed   By: Karen Kays M.D.   On: 09/14/2022 15:06   CT HEAD WO CONTRAST  Result Date: 09/14/2022 CLINICAL DATA:  Neural deficit. EXAM: CT HEAD WITHOUT CONTRAST TECHNIQUE: Contiguous axial images were obtained from the base of the skull through the vertex  without intravenous contrast. RADIATION DOSE REDUCTION: This exam was performed according to the departmental dose-optimization program which includes automated exposure control, adjustment of the mA and/or kV according to patient size and/or use of iterative reconstruction technique. COMPARISON:  September 13, 2021 FINDINGS: Brain: No evidence of acute infarction, hemorrhage, hydrocephalus, extra-axial collection or mass lesion/mass effect. Subtle area of hypodensity in the left basal ganglia/corona radiata is stable from April 2023. Vascular: No hyperdense vessel or unexpected calcification. Skull: Normal. Negative for fracture or focal lesion. Sinuses/Orbits: No acute finding. Other: None. IMPRESSION: 1. No acute intracranial abnormality. 2. Subtle area of hypodensity in the left basal ganglia/corona radiata is stable from April 2023. It is nonspecific but most likely represents microvascular disease. Electronically Signed   By: Ted Mcalpine M.D.   On: 09/14/2022 13:08    EKG: Independently reviewed. nsr  Assessment/Plan Principal Problem:   Balance problem Active Problems:   Coronary artery disease of native artery of native heart with stable angina pectoris   Benign hypertension   Obstructive sleep apnea of adult   Type 2 diabetes mellitus   Pulmonary embolism   Obesity (BMI 30-39.9)   # Ataxia For 1-2 weeks. CT head nothing acute. No other neurologic symptoms. Does not have  vertigo. No home meds strongly associated w/ ataxia. No confusion or othahalmoplegia or risk factors to suggest wernicke. No infectious symptoms. Ddx beyond ischemia includes infection, autoimmune disorders, malignancy, paraneoplastic disease, vitamin deficiencies. Patient is s/p gastric bypass. - f/u mri/mra - check tsh, b12, copper, vitamin e - consider empiric thiamine if neuroimaging negative - tele - pt/ot consults  # OSA - cpap qhs  # Hx DVT/PE Completed treatment, no longer anticoagulated  # HTN Here bp appropriate. Given duration of symptoms think normotension appropriate - re-order home meds when med rec complete  # T2DM Reports normal recent A1c. Here glucose appropriate - re-order home meds when med rec complete  # MDD - re-order home meds when med rec complete  DVT prophylaxis: lovenox Code Status: full, confirmed with patient  Family Communication: friend updated at bedside. Daughter is primary contact  Consults called: none   Level of care: Telemetry Medical Status is: Observation The patient remains OBS appropriate and will d/c before 2 midnights.    Silvano Bilis MD [facility] Pager [phone]  If 7PM-7AM, please contact night-coverage [email or web address] Password [facility]  09/14/2022, 5:02 PM

## 2022-09-14 NOTE — ED Provider Notes (Signed)
[facility] Provider Note    Event Date/Time   First MD Initiated Contact with Patient 09/14/22 1356     (approximate)   History   Chief Complaint: Fall   HPI  Andrea Pollard is a 67 y.o. female with a past history of hypertension, diabetes, morbid obesity who comes the ED complaining of dizziness, which she further describes as a feeling of being off balance.  She has fallen twice today, and has right hip pain as a result.  Denies vision changes or motor weakness.  No head trauma.  No fever or chills, no dysuria.     Physical Exam   Triage Vital Signs: ED Triage Vitals  Enc Vitals Group     BP 09/14/22 1142 139/80     Pulse Rate 09/14/22 1142 82     Resp 09/14/22 1142 20     Temp 09/14/22 1142 98.1 F (36.7 C)     Temp src --      SpO2 09/14/22 1142 95 %     Weight 09/14/22 1145 199 lb (90.3 kg)     Height 09/14/22 1145  (1.651 m)     Head Circumference --      Peak Flow --      Pain Score 09/14/22 1144 0     Pain Loc --      Pain Edu? --      Excl. in GC? --     Most recent vital signs: Vitals:   09/14/22 1600 09/14/22 1609  BP: 106/71   Pulse: 77   Resp: 18   Temp:  98.1 F (36.7 C)  SpO2: 94%     General: Awake, no distress.  CV:  Good peripheral perfusion.  Regular rate and rhythm Resp:  Normal effort.  Clear to auscultation bilaterally Abd:  No distention.  Soft nontender Other:  Cranial nerves 3 through 12 intact.  No motor drift.  Normal finger-to-nose.  No lower extremity edema.   ED Results / Procedures / Treatments   Labs (all labs ordered are listed, but only abnormal results are displayed) Labs Reviewed  CBC - Abnormal; Notable for the following components:      Result Value   RBC 5.28 (*)    MCH 25.9 (*)    All other components within normal limits  COMPREHENSIVE METABOLIC PANEL - Abnormal; Notable for the following components:   Calcium 8.7 (*)    All other components within normal limits   PROTIME-INR  APTT  DIFFERENTIAL  CBG MONITORING, ED  CBG MONITORING, ED  CBG MONITORING, ED     EKG Interpreted by me Normal sinus rhythm rate of 80.  Left axis, normal intervals.  Normal QRS ST segments and T waves   RADIOLOGY CT head interpreted by me, negative for mass or hemorrhage.  Radiology report reviewed   PROCEDURES:  Procedures   MEDICATIONS ORDERED IN ED: Medications  acetaminophen (TYLENOL) tablet 1,000 mg (1,000 mg Oral Given 09/14/22 1436)  metoCLOPramide (REGLAN) tablet 10 mg (10 mg Oral Given 09/14/22 1436)     IMPRESSION / MDM / ASSESSMENT AND PLAN / ED COURSE  I reviewed the triage vital signs and the nursing notes.  DDx: Stroke, AKI, electrolyte abnormality, dehydration, anemia  Patient presents with feeling off balance and 2 falls today, most concerning for stroke.  CT scan unremarkable but does show some evidence of microvascular disease.  She has other stroke risk factors such as hypertension, diabetes, obesity, and I will plan  to hospitalize for further stroke workup.     Clinical Course as of 09/14/22 1643  Thu Sep 14, 2022  1626 Workup unremarkable.  With ambulation, patient has ataxic gait, unable to maintain balance without assistance which is new for her.  Worried about posterior circulation CVA.  Will obtain MRI and plan to hospitalize for further stroke workup. [PS]    Clinical Course User Index [PS] Sharman Cheek, MD    ----------------------------------------- 4:43 PM on 09/14/2022 ----------------------------------------- Case discussed with hospitalist   FINAL CLINICAL IMPRESSION(S) / ED DIAGNOSES   Final diagnoses:  Ataxia  Type 2 diabetes mellitus without complication, without long-term current use of insulin     Rx / DC Orders   ED Discharge Orders     None        Note:  This document was prepared using Dragon voice recognition software and may include unintentional dictation errors.   Sharman Cheek, MD 09/14/22 [phone]

## 2022-09-14 NOTE — ED Triage Notes (Addendum)
Pt to ED [facility] from home for fall x2 today. Reports became dizzy prior to fall. States feels like right leg does not want to work this week , states "Lavenia Atlas been staggering". Reports pain to right hip. Reports difficulty with right leg started x1 week.

## 2022-09-15 DIAGNOSIS — R270 Ataxia, unspecified: Secondary | ICD-10-CM | POA: Diagnosis not present

## 2022-09-15 LAB — VITAMIN B12: Vitamin B-12: 329 pg/mL (ref 180–914)

## 2022-09-15 LAB — HIV ANTIBODY (ROUTINE TESTING W REFLEX): HIV Screen 4th Generation wRfx: NONREACTIVE

## 2022-09-15 MED ORDER — SERTRALINE HCL 100 MG PO TABS: 200.0000 mg | ORAL_TABLET | Freq: Every day | ORAL | 3 refills | Status: AC

## 2022-09-15 NOTE — TOC Transition Note (Signed)
Transition of Care [facility]) - CM/SW Discharge Note   Patient Details  Name: Andrea Pollard MRN: [id number] Date of Birth: [birth year]  Transition of Care [facility] CM/SW Contact:  Garret Reddish, RN Phone Number: 09/15/2022, 12:59 PM   Clinical Narrative:  Chart reviewed.  Noted that patient has orders for discharge.  I have spoken with patient.  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  She informed me that she was working from home prior to admission. She was independent of ADL's prior to admission. [name] informs me that her PCP is Dr. Tonia Ghent.  She uses [facility] in [place] for prescription medications.  She reports that medications are affordable.  I have spoken to [facility] about PT recommendations for Home Health PT and a rolling walker.  Patient reports that she has no home health preference.  I have asked Amy with [facility] to accept home health referral.  I have asked Barbara Cower with [facility] to provide patient a 2 wheeled rolling walker.  Patient reports that her daughter will transport her home today.    I have informed staff nurse of the above information.      Final next level of care: Home w Home Health Services Barriers to Discharge: No Barriers Identified   Patient Goals and CMS Choice   Choice offered to / list presented to : Patient  Discharge Placement                      Patient and family notified of of transfer: 09/15/22  Discharge Plan and Services Additional resources added to the After Visit Summary for                  DME Arranged: Walker rolling DME Agency: [facility] Date DME Agency Contacted: 09/15/22 Time DME Agency Contacted: 1200 Representative spoke with at DME Agency: Barbara Cower HH Arranged: PT, OT [facility] Agency: [facility] Date [facility] Agency Contacted: 09/15/22 Time HH Agency Contacted: 1230 Representative spoke with at [facility] Agency: Amy  Social Determinants of Health (SDOH) Interventions SDOH  Screenings   Food Insecurity: No Food Insecurity (09/14/2022)  Housing: Low Risk  (09/14/2022)  Transportation Needs: No Transportation Needs (09/14/2022)  Utilities: Not At Risk (09/14/2022)  Tobacco Use: Medium Risk (09/14/2022)     Readmission Risk Interventions     No data to display

## 2022-09-15 NOTE — Evaluation (Signed)
Occupational Therapy Evaluation Patient Details Name: Andrea Pollard MRN: [id number] DOB: 1955-10-30 Today's Date: 09/15/2022   History of Present Illness Andrea Pollard is a 67 y.o. female with a past history of hypertension, diabetes, morbid obesity who comes the ED complaining of dizziness, which she further describes as a feeling of being off balance.  She has fallen twice today, and has right hip pain as a result.  Denies vision changes or motor weakness.  No head trauma.  No fever or chills, no dysuria.   Clinical Impression   Pt was seen for OT evaluation this date. Prior to hospital admission, pt was independent, working from home, and does not drive. Pt lives with her 90yo mother. She endorses a few falls recently 2/2 LOB/dizzy/whoozy with positional changes (e.g., she bent over to pick up boxes and when she stood back up she became very lightheaded leading to LOB and fall). With additional questioning, pt notes that she has had more mild symptoms with getting out of bed in the morning and when bending over during LB ADL and sitting back up. Orthostatic vitals taken (negative but symptomatic) and care team notified.  Pt presents to acute OT demonstrating impaired ADL performance and functional mobility 2/2 whooziness, decreased strength, balance, and activity tolerance (See OT problem list). Pt currently requires SBA-CGA for ADL mobility and PRN MIN A for LB ADL tasks. Pt educated in strategies to support slower positional changes and adjustments to minimize risk of whooziness/lightheadedness after positional changes, home/routines modifications, and falls prevention strategies. Pt verbalized understanding and would benefit from skilled OT services to address noted impairments and functional limitations (see below for any additional details) in order to maximize safety and independence while minimizing falls risk. Upon hospital discharge, recommend continued skilled OT services to maximize  pt safety and return to functional independence during meaningful occupations of daily life.     Recommendations for follow up therapy are one component of a multi-disciplinary discharge planning process, led by the attending physician.  Recommendations may be updated based on patient status, additional functional criteria and insurance authorization.   Assistance Recommended at Discharge PRN  Patient can return home with the following A little help with walking and/or transfers;A little help with bathing/dressing/bathroom;Assist for transportation;Help with stairs or ramp for entrance    Functional Status Assessment  Patient has had a recent decline in their functional status and demonstrates the ability to make significant improvements in function in a reasonable and predictable amount of time.  Equipment Recommendations  Tub/shower seat;Other (comment) Lexicographer)    Recommendations for Other Services       Precautions / Restrictions Precautions Precautions: Fall Restrictions Weight Bearing Restrictions: No      Mobility Bed Mobility Overal bed mobility: Modified Independent             General bed mobility comments: a bit hesitant, goes slowly    Transfers Overall transfer level: Modified independent                 General transfer comment: a bit hesitant, goes slowly      Balance Overall balance assessment: Needs assistance Sitting-balance support: No upper extremity supported, Feet supported Sitting balance-Leahy Scale: Normal     Standing balance support: Single extremity supported, During functional activity, No upper extremity supported Standing balance-Leahy Scale: Fair                             ADL  either performed or assessed with clinical judgement   ADL                                         General ADL Comments: Pt currently requires PRN MIN A for LB ADL tasks, increased time/effort to complete, SBA-handheld  assist for ADL transfers and ADL mobility     Vision         Perception     Praxis      Pertinent Vitals/Pain Pain Assessment Pain Assessment: No/denies pain     Hand Dominance     Extremity/Trunk Assessment Upper Extremity Assessment Upper Extremity Assessment: Generalized weakness   Lower Extremity Assessment Lower Extremity Assessment: Generalized weakness   Cervical / Trunk Assessment Cervical / Trunk Assessment: Normal   Communication Communication Communication: No difficulties   Cognition Arousal/Alertness: Awake/alert Behavior During Therapy: WFL for tasks assessed/performed Overall Cognitive Status: Within Functional Limits for tasks assessed                                       General Comments  supine 146/78, sitting 134/81, standing 129/84, and standing 137/80; symptomatic with positional changes    Exercises Other Exercises Other Exercises: Pt educated in strategies to support slower positional changes and adjustments to minimize risk of whooziness/lightheadedness after positional changes, home/routines modifications, and falls prevention strategies.   Shoulder Instructions      Home Living Family/patient expects to be discharged to:: Private residence Living Arrangements: Parent Available Help at Discharge: Family;Available 24 hours/day Type of Home: House Home Access: Level entry     Home Layout: One level               Home Equipment: None          Prior Functioning/Environment Prior Level of Function : Independent/Modified Independent;History of Falls (last six months);Working/employed             Mobility Comments: independent at baseline, her 27 year old mother lives with her. She does not have a car. ADLs Comments: independent, works from home for [employer], desk job        OT Problem List: Decreased strength;Decreased activity tolerance;Impaired balance (sitting and/or standing)      OT  Treatment/Interventions: Self-care/ADL training;Therapeutic exercise;Therapeutic activities;DME and/or AE instruction;Patient/family education;Balance training    OT Goals(Current goals can be found in the care plan section) Acute Rehab OT Goals Patient Stated Goal: get better OT Goal Formulation: With patient Time For Goal Achievement: 09/29/22 Potential to Achieve Goals: Good ADL Goals Pt Will Perform Lower Body Dressing: with modified independence Pt Will Transfer to Toilet: with modified independence Additional ADL Goal #1: Pt will verbalize plan to implement at least 2 learned falls prevention strategies to minimize risk of dizziness leading to falls. Additional ADL Goal #2: Pt will utilize AE PRN for ADL tasks to minimize extreme positional changes, 5/5 opportunities.  OT Frequency: Min 1X/week    Co-evaluation              AM-PAC OT "6 Clicks" Daily Activity     Outcome Measure Help from another person eating meals?: None Help from another person taking care of personal grooming?: None Help from another person toileting, which includes using toliet, bedpan, or urinal?: A Little Help from another person bathing (including  washing, rinsing, drying)?: A Little Help from another person to put on and taking off regular upper body clothing?: None Help from another person to put on and taking off regular lower body clothing?: A Little 6 Click Score: 21   End of Session    Activity Tolerance: Patient tolerated treatment well Patient left: in chair;with call bell/phone within reach;Other (comment) (nurse techs for bath)  OT Visit Diagnosis: Other abnormalities of gait and mobility (R26.89);History of falling (Z91.81);Muscle weakness (generalized) (M62.81)                Time: 0981-1914 OT Time Calculation (min): 21 min Charges:  OT General Charges $OT Visit: 1 Visit OT Evaluation $OT Eval Low Complexity: 1 Low OT Treatments $Self Care/Home Management : 8-22 mins  Arman Filter., MPH, MS, OTR/L ascom [phone] 09/15/22, 11:13 AM

## 2022-09-15 NOTE — Progress Notes (Signed)
Per Dr Ashok Pall, ok to leave IV out

## 2022-09-15 NOTE — Progress Notes (Addendum)
Per Dr Ashok Pall, dc [facility] orders, also made aware of tele monitoring reports HR drops down to 40s-50s at times- acknowledged- no new orders at this time

## 2022-09-15 NOTE — Evaluation (Signed)
Physical Therapy Evaluation Patient Details Name: Andrea Pollard MRN: [id number] DOB: 1955/11/06 Today's Date: 09/15/2022  History of Present Illness  Andrea Pollard is a 67 y.o. female with a past history of hypertension, diabetes, morbid obesity who comes the ED complaining of dizziness, which she further describes as a feeling of being off balance.  She has fallen twice today, and has right hip pain as a result.  Denies vision changes or motor weakness.  No head trauma.  No fever or chills, no dysuria.  Clinical Impression  Patient received in recliner. She is agreeable to PT assessment. Patient educated on making transitions slowly for improved safety and fall prevention. Patient verbalized understanding. She is able to stand without A. Ambulated in hall with single hand held A then no UE support, then with RW. Ambulation is tentative without UE support, patient reports LEs feel weak. She has improve safety and balance when using RW. She is agreeable to using walker. Patient will continue to benefit from skilled PT to improve balance and fall prevention.          Recommendations for follow up therapy are one component of a multi-disciplinary discharge planning process, led by the attending physician.  Recommendations may be updated based on patient status, additional functional criteria and insurance authorization.  Follow Up Recommendations       Assistance Recommended at Discharge PRN  Patient can return home with the following  A little help with walking and/or transfers;A little help with bathing/dressing/bathroom;Assist for transportation    Equipment Recommendations Rolling walker (2 wheels)  Recommendations for Other Services       Functional Status Assessment Patient has had a recent decline in their functional status and demonstrates the ability to make significant improvements in function in a reasonable and predictable amount of time.     Precautions / Restrictions  Precautions Precautions: Fall Restrictions Weight Bearing Restrictions: No      Mobility  Bed Mobility               General bed mobility comments: NT patient in recliner    Transfers Overall transfer level: Independent Equipment used: None                    Ambulation/Gait Ambulation/Gait assistance: Min assist, Min guard Gait Distance (Feet): 200 Feet Assistive device: Rolling walker (2 wheels), None Gait Pattern/deviations: Step-through pattern Gait velocity: WFL     General Gait Details: patient initally ambulated with hand held assist, then just min guard via belt. No overt lob, patient reports legs feel weak. She then ambulated with RW and reports improved feeling of safety with AD.  Stairs            Wheelchair Mobility    Modified Rankin (Stroke Patients Only)       Balance Overall balance assessment: Needs assistance Sitting-balance support: Feet supported Sitting balance-Leahy Scale: Normal     Standing balance support: Single extremity supported, Bilateral upper extremity supported, During functional activity Standing balance-Leahy Scale: Fair                               Pertinent Vitals/Pain Pain Assessment Pain Assessment: No/denies pain    Home Living Family/patient expects to be discharged to:: Private residence Living Arrangements: Parent Available Help at Discharge: Family;Available 24 hours/day Type of Home: House Home Access: Level entry       Home Layout: One level Home Equipment:  None      Prior Function Prior Level of Function : Independent/Modified Independent;History of Falls (last six months)             Mobility Comments: independent at baseline, her 46 year old mother lives with her. She does not have a car. ADLs Comments: independent     Hand Dominance        Extremity/Trunk Assessment   Upper Extremity Assessment Upper Extremity Assessment: Defer to OT evaluation     Lower Extremity Assessment Lower Extremity Assessment: Generalized weakness    Cervical / Trunk Assessment Cervical / Trunk Assessment: Normal  Communication   Communication: No difficulties  Cognition Arousal/Alertness: Awake/alert Behavior During Therapy: WFL for tasks assessed/performed Overall Cognitive Status: Within Functional Limits for tasks assessed                                          General Comments      Exercises     Assessment/Plan    PT Assessment Patient needs continued PT services  PT Problem List Decreased strength;Decreased activity tolerance;Decreased balance;Decreased mobility;Decreased knowledge of use of DME       PT Treatment Interventions DME instruction;Gait training;Functional mobility training;Therapeutic activities;Patient/family education;Balance training;Therapeutic exercise    PT Goals (Current goals can be found in the Care Plan section)  Acute Rehab PT Goals Patient Stated Goal: to return home, improve PT Goal Formulation: With patient Time For Goal Achievement: 09/19/22 Potential to Achieve Goals: Good    Frequency Min 2X/week     Co-evaluation               AM-PAC PT "6 Clicks" Mobility  Outcome Measure Help needed turning from your back to your side while in a flat bed without using bedrails?: None Help needed moving from lying on your back to sitting on the side of a flat bed without using bedrails?: None Help needed moving to and from a bed to a chair (including a wheelchair)?: A Little Help needed standing up from a chair using your arms (e.g., wheelchair or bedside chair)?: None Help needed to walk in hospital room?: A Little Help needed climbing 3-5 steps with a railing? : A Little 6 Click Score: 21    End of Session Equipment Utilized During Treatment: Gait belt Activity Tolerance: Patient tolerated treatment well Patient left: in chair;with call bell/phone within reach Nurse  Communication: Mobility status PT Visit Diagnosis: Muscle weakness (generalized) (M62.81);Unsteadiness on feet (R26.81);Other abnormalities of gait and mobility (R26.89);History of falling (Z91.81)    Time: 1914-7829 PT Time Calculation (min) (ACUTE ONLY): 22 min   Charges:   PT Evaluation $PT Eval Moderate Complexity: 1 Mod PT Treatments $Gait Training: 8-22 mins        [employer], PT, GCS 09/15/22,9:51 AM

## 2022-09-15 NOTE — Discharge Summary (Signed)
Andrea Pollard UJW:[id number] DOB: [birth year] DOA: 09/14/2022  PCP: Oswaldo Conroy, MD  Admit date: 09/14/2022 Discharge date: 09/15/2022  Time spent: 35 minutes  Recommendations for Outpatient Follow-up:  Pcp and neurology f/u  F/u copper and vit e levels    Discharge Diagnoses:  Principal Problem:   Ataxia Active Problems:   Coronary artery disease of native artery of native heart with stable angina pectoris (HCC)   Benign hypertension   Obstructive sleep apnea of adult   Type 2 diabetes mellitus (HCC)   Pulmonary embolism (HCC)   Obesity (BMI 30-39.9)   Discharge Condition: stable  Diet recommendation: heart healthy  Filed Weights   09/14/22 1145  Weight: 90.3 kg    History of present illness:  Andrea Pollard is a 67 y.o. female with medical history significant for obesity, osa, pe/dvt, gatsric bypass, who present with the above.   Symptoms began insidiously 1-2 weeks ago. Characterized by imbalance. No lightheadedness or syncope, no vertigo, no nausea, no med changes, no chest pain or palpitations. When up and moving around a sensation of imbalance. It has slowly worsened such that today she fell landing on her bottom. Has some mild hip pain, no head trauma. In the ED struggled to ambulated for EDP so decision made to admit for further evaluation. Denies toxic habits. No prior hx stroke or similar symptoms.   Hospital Course:  Patient presents with 1-2 weeks of progressive imbalance. Admitted for stroke w/u - MRI and MRA of head/neck are negative. No sensation of room spinning but patient does have a hx of bppv. Her most severe episode happened after bending over and then getting up making orthostasis and/or bppv possible but again does not complain of room spinning or lightheadness, just a sensation of imbalance. She had fallen and mildly hurt her hip, x-rays of hip are negative. She has a hx of gastric bypass so0 we checked for b12, vit e, and copper deficiency.  B12 is normal/ copper and vit e are pending. Pt/ot evaluated patient, no orthostasis, they advised home health pt/ot which we ordered as well as rolling walker. I asked dr. Selina Cooley of neurology to review the case, she opined that inpatient w/u is done, advised outpatient neurology f/u. Patient requests referral to [facility] so I have placed that referral. In terms of home meds advised pcp f/u for med rec. I did advise stopping scheduled hydroxyzine.   Procedures: none   Consultations: none  Discharge Exam: Vitals:   09/15/22 0441 09/15/22 0750  BP: 134/71 (!) 143/75  Pulse: 70 78  Resp: 16 18  Temp: 98 F (36.7 C) 98.4 F (36.9 C)  SpO2: 95% 95%    General: NAD Cardiovascular: RRR Respiratory: CTAB  Discharge Instructions   Discharge Instructions     Ambulatory referral to Neurology   Complete by: As directed    Diet - low sodium heart healthy   Complete by: As directed    Increase activity slowly   Complete by: As directed       Allergies as of 09/15/2022       Reactions   Ibuprofen Other (See Comments)   Cannot Use due to Gastric SLEEVE SURGERY   Levaquin [levofloxacin In D5w] Hives   Sulfamethoxazole-trimethoprim Hives   Nitroglycerin Other (See Comments)   Severe Hypotension        Medication List     STOP taking these medications    citalopram 40 MG tablet Commonly known as: CELEXA   hydrochlorothiazide 25 MG  tablet Commonly known as: HYDRODIURIL   hydrOXYzine 25 MG capsule Commonly known as: VISTARIL       TAKE these medications    albuterol 108 (90 Base) MCG/ACT inhaler Commonly known as: VENTOLIN HFA Inhale 1-2 puffs into the lungs every 6 (six) hours as needed.   atorvastatin 80 MG tablet Commonly known as: LIPITOR Take 80 mg by mouth at bedtime.   dicyclomine 10 MG capsule Commonly known as: BENTYL Take 1 capsule (10 mg total) by mouth 4 (four) times daily -  before meals and at bedtime.   Flovent HFA 220 MCG/ACT inhaler Generic  drug: fluticasone Inhale 2 puffs into the lungs daily.   Jardiance 10 MG Tabs tablet Generic drug: empagliflozin Take 10 mg by mouth daily.   lansoprazole 15 MG capsule Commonly known as: PREVACID Take 15 mg by mouth at bedtime.   lisinopril 10 MG tablet Commonly known as: ZESTRIL Take 10 mg by mouth daily.   meclizine 25 MG tablet Commonly known as: ANTIVERT   metFORMIN 1000 MG tablet Commonly known as: GLUCOPHAGE TAKE 1 TABLET BY MOUTH TWICE DAILY WITH BREAKFAST AND DINNER FOR DIABETES   montelukast 10 MG tablet Commonly known as: SINGULAIR Take 10 mg by mouth daily.   ondansetron 4 MG tablet Commonly known as: ZOFRAN Take 1 tablet (4 mg total) by mouth every 8 (eight) hours as needed for nausea or vomiting.   OneTouch Verio test strip Generic drug: glucose blood TEST ONCE D   Ozempic (0.25 or 0.5 MG/DOSE) 2 MG/1.5ML Sopn Generic drug: Semaglutide(0.25 or 0.5MG /DOS) Inject 0.5 mg as directed once a week.   sertraline 100 MG tablet Commonly known as: ZOLOFT Take 2 tablets (200 mg total) by mouth daily. What changed:  how much to take when to take this   traZODone 150 MG tablet Commonly known as: DESYREL Take 150 mg by mouth at bedtime.               Durable Medical Equipment  (From admission, onward)           Start     Ordered   09/15/22 1224  DME Walker  Once       Question Answer Comment  Walker: With 5 Inch Wheels   Patient needs a walker to treat with the following condition Ataxia      09/15/22 1228           Allergies  Allergen Reactions   Ibuprofen Other (See Comments)    Cannot Use due to Gastric SLEEVE SURGERY   Levaquin [Levofloxacin In D5w] Hives   Sulfamethoxazole-Trimethoprim Hives   Nitroglycerin Other (See Comments)    Severe Hypotension    Follow-up Information     Oswaldo Conroy, MD Follow up.   Specialty: Family Medicine Contact information: [address] [place]  [address] [phone]                  The results of significant diagnostics from this hospitalization (including imaging, microbiology, ancillary and laboratory) are listed below for reference.    Significant Diagnostic Studies: MR BRAIN WO CONTRAST  Result Date: 09/14/2022 CLINICAL DATA:  Initial evaluation for neuro deficit, stroke. EXAM: MRI HEAD WITHOUT CONTRAST MRA HEAD WITHOUT CONTRAST MRA NECK WITHOUT CONTRAST TECHNIQUE: Multiplanar, multiecho pulse sequences of the brain and surrounding structures were obtained without intravenous contrast. Angiographic images of the Circle of Willis were obtained using MRA technique without intravenous contrast. Angiographic images of the neck were obtained using MRA  technique without intravenous contrast. Carotid stenosis measurements (when applicable) are obtained utilizing NASCET criteria, using the distal internal carotid diameter as the denominator. COMPARISON:  CT from earlier the same day. FINDINGS: MRI HEAD FINDINGS Brain: Cerebral volume within normal limits. Mild patchy T2/FLAIR hyperintensity involving the periventricular deep white matter both cerebral hemispheres as well as the pons, most consistent with chronic small vessel ischemic disease, mild in nature. No evidence for acute or subacute ischemia. Gray-white matter differentiation maintained. No areas of chronic cortical infarction. No acute intracranial hemorrhage. Single chronic microhemorrhage noted within the left pons, of doubtful significance in isolation. No mass lesion, midline shift or mass effect. No hydrocephalus or extra-axial fluid collection. Pituitary gland suprasellar region within normal limits. Vascular: Major intracranial vascular flow voids are maintained. Skull and upper cervical spine: Craniocervical junction within normal limits. Bone marrow signal intensity normal. No scalp soft tissue abnormality. Sinuses/Orbits: Globes orbital soft tissues within normal  limits. Paranasal sinuses are largely clear. No significant mastoid effusion. Other: None. MRA HEAD FINDINGS ANTERIOR CIRCULATION: Both internal carotid arteries are patent to the termini without stenosis or other abnormality. Dominant left A1 widely patent. Right A1 hypoplastic but patent as well. Normal anterior communicating complex. Anterior cerebral arteries patent without stenosis. No M1 stenosis or occlusion. No proximal MCA branch occlusion or high-grade stenosis. Distal MCA branches well perfused and symmetric. POSTERIOR CIRCULATION: Both V4 segments are patent without stenosis. Left vertebral artery dominant. Both PICA patent. Basilar patent without stenosis. Superior cerebellar arteries patent bilaterally. Left PCA supplied primarily via the basilar. Predominant fetal type origin of the right PCA. Both PCAs patent without stenosis. Anatomic variants: As above. Other: No intracranial aneurysm. MRA NECK FINDINGS AORTIC ARCH: Examination mildly limited by motion and lack of IV contrast. Visualized aortic arch normal caliber with standard 3 vessel morphology. No stenosis about the origin the great vessels. RIGHT CAROTID SYSTEM: Right common and internal carotid arteries are patent without evidence for dissection. No significant atheromatous irregularity or stenosis about the right carotid bulb. LEFT CAROTID SYSTEM: Left common and internal carotid arteries are patent without evidence for dissection. No significant atheromatous irregularity or narrowing about the left carotid bulb. VERTEBRAL ARTERIES: Both vertebral arteries arise from subclavian arteries. No proximal subclavian artery stenosis. Left vertebral artery dominant. Vertebral arteries are patent without stenosis or evidence for dissection. Other: None. IMPRESSION: MRI HEAD IMPRESSION: 1. No acute intracranial abnormality. 2. Mild chronic microvascular ischemic disease for age. MRA HEAD IMPRESSION: Normal intracranial MRA. No large vessel occlusion,  hemodynamically significant stenosis, or other acute vascular abnormality. MRA NECK IMPRESSION: Normal MRA of the neck. No hemodynamically significant stenosis or other acute vascular abnormality. Electronically Signed   By: Rise Mu M.D.   On: 09/14/2022 23:17   MR ANGIO HEAD WO CONTRAST  Result Date: 09/14/2022 CLINICAL DATA:  Initial evaluation for neuro deficit, stroke. EXAM: MRI HEAD WITHOUT CONTRAST MRA HEAD WITHOUT CONTRAST MRA NECK WITHOUT CONTRAST TECHNIQUE: Multiplanar, multiecho pulse sequences of the brain and surrounding structures were obtained without intravenous contrast. Angiographic images of the Circle of Willis were obtained using MRA technique without intravenous contrast. Angiographic images of the neck were obtained using MRA technique without intravenous contrast. Carotid stenosis measurements (when applicable) are obtained utilizing NASCET criteria, using the distal internal carotid diameter as the denominator. COMPARISON:  CT from earlier the same day. FINDINGS: MRI HEAD FINDINGS Brain: Cerebral volume within normal limits. Mild patchy T2/FLAIR hyperintensity involving the periventricular deep white matter both cerebral hemispheres as well as the  pons, most consistent with chronic small vessel ischemic disease, mild in nature. No evidence for acute or subacute ischemia. Gray-white matter differentiation maintained. No areas of chronic cortical infarction. No acute intracranial hemorrhage. Single chronic microhemorrhage noted within the left pons, of doubtful significance in isolation. No mass lesion, midline shift or mass effect. No hydrocephalus or extra-axial fluid collection. Pituitary gland suprasellar region within normal limits. Vascular: Major intracranial vascular flow voids are maintained. Skull and upper cervical spine: Craniocervical junction within normal limits. Bone marrow signal intensity normal. No scalp soft tissue abnormality. Sinuses/Orbits: Globes orbital  soft tissues within normal limits. Paranasal sinuses are largely clear. No significant mastoid effusion. Other: None. MRA HEAD FINDINGS ANTERIOR CIRCULATION: Both internal carotid arteries are patent to the termini without stenosis or other abnormality. Dominant left A1 widely patent. Right A1 hypoplastic but patent as well. Normal anterior communicating complex. Anterior cerebral arteries patent without stenosis. No M1 stenosis or occlusion. No proximal MCA branch occlusion or high-grade stenosis. Distal MCA branches well perfused and symmetric. POSTERIOR CIRCULATION: Both V4 segments are patent without stenosis. Left vertebral artery dominant. Both PICA patent. Basilar patent without stenosis. Superior cerebellar arteries patent bilaterally. Left PCA supplied primarily via the basilar. Predominant fetal type origin of the right PCA. Both PCAs patent without stenosis. Anatomic variants: As above. Other: No intracranial aneurysm. MRA NECK FINDINGS AORTIC ARCH: Examination mildly limited by motion and lack of IV contrast. Visualized aortic arch normal caliber with standard 3 vessel morphology. No stenosis about the origin the great vessels. RIGHT CAROTID SYSTEM: Right common and internal carotid arteries are patent without evidence for dissection. No significant atheromatous irregularity or stenosis about the right carotid bulb. LEFT CAROTID SYSTEM: Left common and internal carotid arteries are patent without evidence for dissection. No significant atheromatous irregularity or narrowing about the left carotid bulb. VERTEBRAL ARTERIES: Both vertebral arteries arise from subclavian arteries. No proximal subclavian artery stenosis. Left vertebral artery dominant. Vertebral arteries are patent without stenosis or evidence for dissection. Other: None. IMPRESSION: MRI HEAD IMPRESSION: 1. No acute intracranial abnormality. 2. Mild chronic microvascular ischemic disease for age. MRA HEAD IMPRESSION: Normal intracranial MRA.  No large vessel occlusion, hemodynamically significant stenosis, or other acute vascular abnormality. MRA NECK IMPRESSION: Normal MRA of the neck. No hemodynamically significant stenosis or other acute vascular abnormality. Electronically Signed   By: Rise Mu M.D.   On: 09/14/2022 23:17   MR ANGIO NECK WO CONTRAST  Result Date: 09/14/2022 CLINICAL DATA:  Initial evaluation for neuro deficit, stroke. EXAM: MRI HEAD WITHOUT CONTRAST MRA HEAD WITHOUT CONTRAST MRA NECK WITHOUT CONTRAST TECHNIQUE: Multiplanar, multiecho pulse sequences of the brain and surrounding structures were obtained without intravenous contrast. Angiographic images of the Circle of Willis were obtained using MRA technique without intravenous contrast. Angiographic images of the neck were obtained using MRA technique without intravenous contrast. Carotid stenosis measurements (when applicable) are obtained utilizing NASCET criteria, using the distal internal carotid diameter as the denominator. COMPARISON:  CT from earlier the same day. FINDINGS: MRI HEAD FINDINGS Brain: Cerebral volume within normal limits. Mild patchy T2/FLAIR hyperintensity involving the periventricular deep white matter both cerebral hemispheres as well as the pons, most consistent with chronic small vessel ischemic disease, mild in nature. No evidence for acute or subacute ischemia. Gray-white matter differentiation maintained. No areas of chronic cortical infarction. No acute intracranial hemorrhage. Single chronic microhemorrhage noted within the left pons, of doubtful significance in isolation. No mass lesion, midline shift or mass effect. No hydrocephalus or extra-axial  fluid collection. Pituitary gland suprasellar region within normal limits. Vascular: Major intracranial vascular flow voids are maintained. Skull and upper cervical spine: Craniocervical junction within normal limits. Bone marrow signal intensity normal. No scalp soft tissue abnormality.  Sinuses/Orbits: Globes orbital soft tissues within normal limits. Paranasal sinuses are largely clear. No significant mastoid effusion. Other: None. MRA HEAD FINDINGS ANTERIOR CIRCULATION: Both internal carotid arteries are patent to the termini without stenosis or other abnormality. Dominant left A1 widely patent. Right A1 hypoplastic but patent as well. Normal anterior communicating complex. Anterior cerebral arteries patent without stenosis. No M1 stenosis or occlusion. No proximal MCA branch occlusion or high-grade stenosis. Distal MCA branches well perfused and symmetric. POSTERIOR CIRCULATION: Both V4 segments are patent without stenosis. Left vertebral artery dominant. Both PICA patent. Basilar patent without stenosis. Superior cerebellar arteries patent bilaterally. Left PCA supplied primarily via the basilar. Predominant fetal type origin of the right PCA. Both PCAs patent without stenosis. Anatomic variants: As above. Other: No intracranial aneurysm. MRA NECK FINDINGS AORTIC ARCH: Examination mildly limited by motion and lack of IV contrast. Visualized aortic arch normal caliber with standard 3 vessel morphology. No stenosis about the origin the great vessels. RIGHT CAROTID SYSTEM: Right common and internal carotid arteries are patent without evidence for dissection. No significant atheromatous irregularity or stenosis about the right carotid bulb. LEFT CAROTID SYSTEM: Left common and internal carotid arteries are patent without evidence for dissection. No significant atheromatous irregularity or narrowing about the left carotid bulb. VERTEBRAL ARTERIES: Both vertebral arteries arise from subclavian arteries. No proximal subclavian artery stenosis. Left vertebral artery dominant. Vertebral arteries are patent without stenosis or evidence for dissection. Other: None. IMPRESSION: MRI HEAD IMPRESSION: 1. No acute intracranial abnormality. 2. Mild chronic microvascular ischemic disease for age. MRA HEAD  IMPRESSION: Normal intracranial MRA. No large vessel occlusion, hemodynamically significant stenosis, or other acute vascular abnormality. MRA NECK IMPRESSION: Normal MRA of the neck. No hemodynamically significant stenosis or other acute vascular abnormality. Electronically Signed   By: Rise Mu M.D.   On: 09/14/2022 23:17   DG Hip Unilat W or Wo Pelvis 2-3 Views Right  Result Date: 09/14/2022 CLINICAL DATA:  Pain after fall EXAM: DG HIP (WITH OR WITHOUT PELVIS) 3V RIGHT COMPARISON:  None Available. FINDINGS: No fracture or dislocation. Preserved joint spaces and bone mineralization. Mild sclerosis along the sacroiliac joints. Mild hyperostosis. Scattered vascular calcifications. IMPRESSION: Mild degenerative changes.  No acute osseous abnormality Electronically Signed   By: Karen Kays M.D.   On: 09/14/2022 15:06   CT HEAD WO CONTRAST  Result Date: 09/14/2022 CLINICAL DATA:  Neural deficit. EXAM: CT HEAD WITHOUT CONTRAST TECHNIQUE: Contiguous axial images were obtained from the base of the skull through the vertex without intravenous contrast. RADIATION DOSE REDUCTION: This exam was performed according to the departmental dose-optimization program which includes automated exposure control, adjustment of the mA and/or kV according to patient size and/or use of iterative reconstruction technique. COMPARISON:  September 13, 2021 FINDINGS: Brain: No evidence of acute infarction, hemorrhage, hydrocephalus, extra-axial collection or mass lesion/mass effect. Subtle area of hypodensity in the left basal ganglia/corona radiata is stable from April 2023. Vascular: No hyperdense vessel or unexpected calcification. Skull: Normal. Negative for fracture or focal lesion. Sinuses/Orbits: No acute finding. Other: None. IMPRESSION: 1. No acute intracranial abnormality. 2. Subtle area of hypodensity in the left basal ganglia/corona radiata is stable from April 2023. It is nonspecific but most likely represents  microvascular disease. Electronically Signed   By: Sharlyne Pacas  Dimitrova M.D.   On: 09/14/2022 13:08    Microbiology: No results found for this or any previous visit (from the past 240 hour(s)).   Labs: Basic Metabolic Panel: Recent Labs  Lab 09/14/22 1152  NA 139  K 4.0  CL 105  CO2 26  GLUCOSE 96  BUN 15  CREATININE 0.66  CALCIUM 8.7*   Liver Function Tests: Recent Labs  Lab 09/14/22 1152  AST 30  ALT 18  ALKPHOS 84  BILITOT 0.9  PROT 7.0  ALBUMIN 3.9   No results for input(s): "LIPASE", "AMYLASE" in the last 168 hours. No results for input(s): "AMMONIA" in the last 168 hours. CBC: Recent Labs  Lab 09/14/22 1152  WBC 5.2  NEUTROABS 3.2  HGB 13.7  HCT 42.3  MCV 80.1  PLT 165   Cardiac Enzymes: No results for input(s): "CKTOTAL", "CKMB", "CKMBINDEX", "TROPONINI" in the last 168 hours. BNP: BNP (last 3 results) No results for input(s): "BNP" in the last 8760 hours.  ProBNP (last 3 results) No results for input(s): "PROBNP" in the last 8760 hours.  CBG: Recent Labs  Lab 09/14/22 1147  GLUCAP 84       Signed:  Silvano Bilis MD.  [facility] 09/15/2022, 12:31 PM

## 2022-09-16 LAB — HEMOGLOBIN A1C
Hgb A1c MFr Bld: 5.6 % (ref 4.8–5.6)
Mean Plasma Glucose: 114 mg/dL

## 2022-09-18 LAB — VITAMIN E
Vitamin E (Alpha Tocopherol): 8.2 mg/L — ABNORMAL LOW (ref 9.0–29.0)
Vitamin E(Gamma Tocopherol): 1.5 mg/L (ref 0.5–4.9)

## 2022-09-19 LAB — COPPER, SERUM: Copper: 99 ug/dL (ref 80–158)

## 2022-09-22 ENCOUNTER — Ambulatory Visit
Admission: RE | Admit: 2022-09-22 | Discharge: 2022-09-22 | Disposition: A | Discharge status: Home / Self Care | Payer: Managed Care, Other (non HMO) | Source: Ambulatory Visit | Attending: Family Medicine | Admitting: Family Medicine

## 2022-09-22 DIAGNOSIS — Z1231 Encounter for screening mammogram for malignant neoplasm of breast: Secondary | ICD-10-CM | POA: Diagnosis present

## 2022-10-24 ENCOUNTER — Telehealth: Payer: Self-pay

## 2022-10-24 DIAGNOSIS — K529 Noninfective gastroenteritis and colitis, unspecified: Secondary | ICD-10-CM

## 2022-10-24 NOTE — Telephone Encounter (Signed)
She was given a trial of Bentyl in the past which we can try if she thinks it was helping with cramping and loose stools Recommend H. pylori breath test and celiac disease panel If these are negative, we can empirically treat for bacterial overgrowth with 2 weeks course of Xifaxan 550 mg 3 times daily  RV

## 2022-10-24 NOTE — Telephone Encounter (Addendum)
Pt called to report that she has been experiencing 2-3 loose stools daily, mixed with intermittent constipation and abdominal bloating... I have scheduled pt with Andrea Pollard for 11/14/22 and pt wanted to know if there is anything she can do in the meantime  Please advise

## 2022-10-25 MED ORDER — DICYCLOMINE HCL 10 MG PO CAPS: 10.0000 mg | ORAL_CAPSULE | Freq: Three times a day (TID) | ORAL | 0 refills | Status: DC

## 2022-10-25 NOTE — Addendum Note (Signed)
Addended by: Radene Knee L on: 10/25/2022 08:39 AM   Modules accepted: Orders

## 2022-10-25 NOTE — Telephone Encounter (Signed)
Patient verbalized understanding of instructions. She will come to our office to have the lab work done. She states she does not remember taking the medication but will try it. Sent medication to the pharmacy

## 2022-10-27 LAB — H. PYLORI BREATH TEST

## 2022-10-27 LAB — CELIAC DISEASE PANEL

## 2022-10-28 LAB — CELIAC DISEASE PANEL

## 2022-10-31 LAB — CELIAC DISEASE PANEL: IgA/Immunoglobulin A, Serum: 301 mg/dL (ref 87–352)

## 2022-11-02 ENCOUNTER — Telehealth: Payer: Self-pay

## 2022-11-02 MED ORDER — RIFAXIMIN 550 MG PO TABS: 550.0000 mg | ORAL_TABLET | Freq: Three times a day (TID) | ORAL | 0 refills | Status: AC

## 2022-11-02 NOTE — Telephone Encounter (Signed)
Submitted a PA for Xifaxan through cover my meds. Waiting on response insurance company

## 2022-11-02 NOTE — Telephone Encounter (Signed)
Patient verbalized understanding of results. She states she needs some answers the last two days has not had diarrhea but then this morning had a blow out. She states she hopes this medication helps

## 2022-11-02 NOTE — Telephone Encounter (Signed)
-----   Message from Toney Reil, MD sent at 11/01/2022 10:54 PM EDT ----- H. pylori breath test and celiac disease panel negative.  Recommend 2 weeks course of Xifaxan 550 mg 3 times daily

## 2022-11-07 ENCOUNTER — Telehealth: Payer: Self-pay

## 2022-11-07 NOTE — Telephone Encounter (Signed)
Received a fax from cover my meds for a PA for Xifaxan. Key is [id number]

## 2022-11-08 NOTE — Telephone Encounter (Signed)
If the appeal does not go through, will try metronidazole 500 mg twice daily for 2 weeks  RV

## 2022-11-08 NOTE — Telephone Encounter (Signed)
Insurance company denied the PA for [employer] submitted a appeal through cover my meds. Waiting on response from the appeal. Do you recommend in changing the medication?

## 2022-11-10 MED ORDER — METRONIDAZOLE 500 MG PO TABS: 500.0000 mg | ORAL_TABLET | Freq: Two times a day (BID) | ORAL | 0 refills | Status: AC

## 2022-11-10 NOTE — Telephone Encounter (Signed)
Left a detail message for patient.

## 2022-11-10 NOTE — Addendum Note (Signed)
Addended by: Radene Knee L on: 11/10/2022 09:20 AM   Modules accepted: Orders

## 2022-11-13 NOTE — Progress Notes (Signed)
Celso Amy, PA-C [address]  [address]  [place], [place] [address]  Main: [phone]  Fax: [phone]   Primary Care Physician: Oswaldo Conroy, MD  Primary Gastroenterologist:  Dr. Lannette Donath / Celso Amy, PA-C   CC: Follow-up irritable bowel syndrome   HPI: Andrea Pollard is a 67 y.o. female, who last saw Dr. Allegra Lai 03/2022, presents for follow-up of irritable bowel syndrome.  She has had episodes of constipation alternating with diarrhea for many years.  Generalized abdominal cramping and bloating.  Recently started on Xifaxan for IBS flare.  Colonoscopy done 03/2022 by Dr. Allegra Lai showed 10 mm sessile polyp removed from descending colon and a clip was placed.  Pathology showed sessile serrated polyp with focal adenomatous dysplasia.  Negative for malignancy.  Biopsies negative for microscopic colitis.  EGD 03/2022 showed a medium hiatal hernia, normal stomach, duodenum, and esophagus.  Nonbleeding duodenal diverticulum.  Biopsies negative for celiac and H. pylori.  04/2022 -pancreatic elastase normal, GI pathogen panel negative. Labs 09/14/2022 -normal CMP, CBC, B12, TSH.  HIV negative. Recent H. pylori breath test negative.  Current Outpatient Medications  Medication Sig Dispense Refill   albuterol (VENTOLIN HFA) 108 (90 Base) MCG/ACT inhaler Inhale 1-2 puffs into the lungs every 6 (six) hours as needed. 18 g 0   atorvastatin (LIPITOR) 80 MG tablet Take 80 mg by mouth at bedtime.   2   dicyclomine (BENTYL) 10 MG capsule Take 1 capsule (10 mg total) by mouth 3 (three) times daily before meals. As  needed 30 capsule 0   empagliflozin (JARDIANCE) 10 MG TABS tablet Take 10 mg by mouth daily.     FLOVENT HFA 220 MCG/ACT inhaler Inhale 2 puffs into the lungs daily.  (Patient not taking: Reported on 09/14/2022)  5   lansoprazole (PREVACID) 15 MG capsule Take 15 mg by mouth at bedtime.      lisinopril (ZESTRIL) 10 MG tablet Take 10 mg by mouth daily.      meclizine (ANTIVERT) 25 MG tablet      metFORMIN (GLUCOPHAGE) 1000 MG tablet TAKE 1 TABLET BY MOUTH TWICE DAILY WITH BREAKFAST AND DINNER FOR DIABETES (Patient not taking: Reported on 05/04/2022)     metroNIDAZOLE (FLAGYL) 500 MG tablet Take 1 tablet (500 mg total) by mouth 2 (two) times daily for 14 days. 28 tablet 0   montelukast (SINGULAIR) 10 MG tablet Take 10 mg by mouth daily.     ondansetron (ZOFRAN) 4 MG tablet Take 1 tablet (4 mg total) by mouth every 8 (eight) hours as needed for nausea or vomiting. (Patient not taking: Reported on 09/14/2022) 14 tablet 0   ONETOUCH VERIO test strip TEST ONCE D 100 each 3   OZEMPIC, 0.25 OR 0.5 MG/DOSE, 2 MG/1.5ML SOPN Inject 0.5 mg as directed once a week.     rifaximin (XIFAXAN) 550 MG TABS tablet Take 1 tablet (550 mg total) by mouth 3 (three) times daily for 14 days. 42 tablet 0   sertraline (ZOLOFT) 100 MG tablet Take 2 tablets (200 mg total) by mouth daily.  3   traZODone (DESYREL) 150 MG tablet Take 150 mg by mouth at bedtime.     No current facility-administered medications for this visit.    Allergies as of 11/14/2022 - Review Complete 09/14/2022  Allergen Reaction Noted   Ibuprofen Other (See Comments) 10/25/2014   Levaquin [levofloxacin in d5w] Hives 10/25/2014   Sulfamethoxazole-trimethoprim Hives 04/09/2014   Nitroglycerin Other (See Comments) 10/25/2014    Past  Medical History:  Diagnosis Date   Anxiety    Asthma    Complication of anesthesia    COPD (chronic obstructive pulmonary disease) (HCC)    diagnosed from pulmonary function tests   Depression    Diabetes mellitus without complication (HCC)    DVT (deep venous thrombosis) (HCC)    Dvt femoral (deep venous thrombosis) (HCC) 2018   in lung also.  treated with xarelto for 6 months   GERD (gastroesophageal reflux disease)    Hepatitis    AGE 64-HEPATITIS B   History of hiatal hernia    History of kidney stones 2019   History of methicillin resistant staphylococcus  aureus (MRSA) 2013   in neck due to ingrown hair. had surgery to drain cyst   History of palpitations 2019   due to a panic attack   Hypertension    Lung nodule 2019   being followed by dr. Orlie Dakin, dr Thelma Barge and dr. Belia Heman   Pneumonia 04/2016   PONV (postoperative nausea and vomiting)    NAUSEATED   Pulmonary emboli (HCC) 2018   Sleep apnea    CPAP   Vertigo     Past Surgical History:  Procedure Laterality Date   ABDOMINAL HYSTERECTOMY  1994   CESAREAN SECTION     CHOLECYSTECTOMY  2013   COLONOSCOPY WITH PROPOFOL N/A 04/20/2022   Procedure: COLONOSCOPY WITH PROPOFOL;  Surgeon: Toney Reil, MD;  Location: [facility] END[facility];  Service: Gastroenterology;  Laterality: N/A;   CYST REMOVAL NECK  2013   was ingrown hair . positive for mrsa at that time prior to removal in OR   CYSTOSCOPY WITH URETEROSCOPY AND STENT PLACEMENT Right 05/31/2018   Procedure: CYSTOSCOPY WITH URETEROSCOPY, LASER LITHOTRIPSY AND STENT PLACEMENT;  Surgeon: Sondra Come, MD;  Location: [facility] OR[facility];  Service: Urology;  Laterality: Right;   DIAGNOSTIC LAPAROSCOPY     ELECTROMAGNETIC NAVIGATION BROCHOSCOPY N/A 08/15/2016   Procedure: ELECTROMAGNETIC NAVIGATION BRONCHOSCOPY;  Surgeon: Erin Fulling, MD;  Location: [facility] ORS;  Service: Cardiopulmonary;  Laterality: N/A;   ELECTROMAGNETIC NAVIGATION BROCHOSCOPY Right 11/07/2017   Procedure: ELECTROMAGNETIC NAVIGATION BRONCHOSCOPY;  Surgeon: Erin Fulling, MD;  Location: [facility];  Service: Cardiopulmonary;  Laterality: Right;   ESOPHAGOGASTRODUODENOSCOPY (EGD) WITH PROPOFOL N/A 04/20/2022   Procedure: ESOPHAGOGASTRODUODENOSCOPY (EGD) WITH PROPOFOL;  Surgeon: Toney Reil, MD;  Location: [facility] ENDOSCOPY;  Service: Gastroenterology;  Laterality: N/A;   FLEXIBLE SIGMOIDOSCOPY N/A 05/04/2022   Procedure: FLEXIBLE SIGMOIDOSCOPY;  Surgeon: Toney Reil, MD;  Location: [facility] ENDOSCOPY;  Service: Gastroenterology;  Laterality: N/A;   LAPAROSCOPIC GASTRIC SLEEVE  RESECTION  2015   lost 75 pounds    Review of Systems:    All systems reviewed and negative except where noted in HPI.   Physical Examination:   There were no vitals taken for this visit.  General: Well-nourished, well-developed in no acute distress.  Eyes: No icterus. Conjunctivae pink. Mouth: Oropharyngeal mucosa moist and pink , no lesions erythema or exudate. Lungs: Clear to auscultation bilaterally. Non-labored. Heart: Regular rate and rhythm, no murmurs rubs or gallops.  Abdomen: Bowel sounds are normal; Abdomen is Soft; No hepatosplenomegaly, masses or hernias;  No Abdominal Tenderness; No guarding or rebound tenderness. Extremities: No lower extremity edema. No clubbing or deformities. Neuro: Alert and oriented x 3.  Grossly intact. Skin: Warm and dry, no jaundice.   Psych: Alert and cooperative, normal mood and affect.   Imaging Studies: No results found.  Assessment and Plan:   Andrea Pollard is a 67 y.o.  y/o female presents for follow-up of:  1.  Irritable bowel syndrome, mixed  2.  History of 10 mm sessile serrated colon polyp removed 02/2022.  Repeat colonoscopy in 3 years (02/2025).     Celso Amy, PA-C  Follow up in ***  BP check ***

## 2022-11-14 ENCOUNTER — Encounter: Payer: Self-pay | Admitting: Physician Assistant

## 2022-11-14 ENCOUNTER — Ambulatory Visit: Payer: Managed Care, Other (non HMO) | Admitting: Physician Assistant

## 2022-11-14 VITALS — BP 131/76 | HR 84 | Temp 98.2°F | Ht 65.0 in | Wt 193.4 lb

## 2022-11-14 DIAGNOSIS — Z86010 Personal history of colonic polyps: Secondary | ICD-10-CM

## 2022-11-14 DIAGNOSIS — R1084 Generalized abdominal pain: Secondary | ICD-10-CM

## 2022-11-14 DIAGNOSIS — K580 Irritable bowel syndrome with diarrhea: Secondary | ICD-10-CM | POA: Diagnosis not present

## 2022-11-16 ENCOUNTER — Telehealth: Payer: Self-pay

## 2022-11-16 ENCOUNTER — Telehealth: Payer: Self-pay | Admitting: Physician Assistant

## 2022-11-16 NOTE — Telephone Encounter (Signed)
Pt left message discussing that she dosent think she's lost weight but she's lost 30 lbs really concerned please return call

## 2022-11-16 NOTE — Telephone Encounter (Signed)
Spoke with patient she wanted to let us know she has lost approximately 30 pounds over a period of time and was concerned-patient was instructed to keep a diary of her weight loss and let us know what it is when she comes for follow up in a few weeks.

## 2022-12-06 ENCOUNTER — Other Ambulatory Visit: Payer: Self-pay | Admitting: Gastroenterology

## 2022-12-14 ENCOUNTER — Other Ambulatory Visit: Payer: Self-pay | Admitting: Gastroenterology

## 2022-12-18 ENCOUNTER — Other Ambulatory Visit: Payer: Self-pay

## 2022-12-21 ENCOUNTER — Ambulatory Visit: Payer: Managed Care, Other (non HMO) | Admitting: Physician Assistant

## 2023-01-15 DIAGNOSIS — M19032 Primary osteoarthritis, left wrist: Secondary | ICD-10-CM | POA: Insufficient documentation

## 2023-01-15 DIAGNOSIS — M25512 Pain in left shoulder: Secondary | ICD-10-CM

## 2023-01-15 DIAGNOSIS — M79642 Pain in left hand: Secondary | ICD-10-CM | POA: Insufficient documentation

## 2023-01-15 DIAGNOSIS — M778 Other enthesopathies, not elsewhere classified: Secondary | ICD-10-CM | POA: Insufficient documentation

## 2023-01-15 DIAGNOSIS — M25532 Pain in left wrist: Secondary | ICD-10-CM | POA: Insufficient documentation

## 2023-01-15 DIAGNOSIS — M19012 Primary osteoarthritis, left shoulder: Secondary | ICD-10-CM | POA: Insufficient documentation

## 2023-01-15 HISTORY — DX: Other enthesopathies, not elsewhere classified: M77.8

## 2023-01-15 HISTORY — DX: Pain in left wrist: M25.532

## 2023-01-15 HISTORY — DX: Pain in left shoulder: M25.512

## 2023-01-15 HISTORY — DX: Primary osteoarthritis, left wrist: M19.032

## 2023-01-15 HISTORY — DX: Pain in left hand: M79.642

## 2023-01-15 HISTORY — DX: Primary osteoarthritis, left shoulder: M19.012

## 2023-01-17 ENCOUNTER — Other Ambulatory Visit: Payer: Self-pay

## 2023-01-17 NOTE — Progress Notes (Deleted)
Celso Amy, PA-C [address]  [address]  [place], [place] [address]  Main: [phone]  Fax: [phone]   Primary Care Physician: Oswaldo Conroy, MD  Primary Gastroenterologist:  Celso Amy, PA-C / Dr. Lannette Donath    CC: Follow-up irritable bowel syndrome with diarrhea and constipation  HPI: Andrea Pollard is a 67 y.o. female returns for 56-month follow-up of irritable bowel syndrome, diarrhea predominant with occasional constipation.  She tried Xifaxan with no benefit.  For the past 2 months she has been taking dicyclomine 10 Mg 3 times daily, align probiotic once daily, FiberCon 2 tablets twice daily.  Imodium as needed.  She tried low FODMAP diet.  Plan was to try cholestyramine or Colestid for possible bile salt diarrhea postcholecystectomy diarrhea persisted.  She has history of 10 mm sessile serrated colon polyp removed 02/2022.  Flex sig 04/2022 showed no residual.  1 year repeat colonoscopy will be due 04/2023.  GI History: -Colonoscopy 03/2022 by Dr. Allegra Lai showed 10 mm sessile polyp removed from descending colon and a clip was placed.  Pathology showed sessile serrated polyp with focal adenomatous dysplasia.  Negative for malignancy.  Biopsies negative for microscopic colitis.  -EGD 03/2022 showed a medium hiatal hernia, normal stomach, duodenum, and esophagus.  Nonbleeding duodenal diverticulum.  Biopsies negative for celiac and H. pylori.  -Sool Studies 04/2022 -pancreatic elastase normal, GI pathogen panel negative. -Labs 09/14/2022 -normal CMP, CBC, B12, TSH.  HIV negative. Recent H. pylori breath test negative.  Current Outpatient Medications  Medication Sig Dispense Refill   albuterol (VENTOLIN HFA) 108 (90 Base) MCG/ACT inhaler Inhale 1-2 puffs into the lungs every 6 (six) hours as needed. 18 g 0   atorvastatin (LIPITOR) 80 MG tablet Take 80 mg by mouth at bedtime.   2   dicyclomine (BENTYL) 10 MG capsule TAKE 1 CAPSULE(10 MG) BY MOUTH THREE TIMES  DAILY BEFORE MEALS AS NEEDED 30 capsule 0   empagliflozin (JARDIANCE) 10 MG TABS tablet Take 10 mg by mouth daily.     FLOVENT HFA 220 MCG/ACT inhaler Inhale 2 puffs into the lungs daily.  5   lansoprazole (PREVACID) 15 MG capsule Take 15 mg by mouth at bedtime.      lisinopril (ZESTRIL) 10 MG tablet Take 10 mg by mouth daily.     LISINOPRIL PO      meclizine (ANTIVERT) 25 MG tablet      montelukast (SINGULAIR) 10 MG tablet Take 10 mg by mouth daily.     ondansetron (ZOFRAN) 4 MG tablet Take 1 tablet (4 mg total) by mouth every 8 (eight) hours as needed for nausea or vomiting. 14 tablet 0   ONETOUCH VERIO test strip TEST ONCE D 100 each 3   OZEMPIC, 0.25 OR 0.5 MG/DOSE, 2 MG/1.5ML SOPN Inject 0.5 mg as directed once a week.     sertraline (ZOLOFT) 100 MG tablet Take 2 tablets (200 mg total) by mouth daily.  3   sertraline (ZOLOFT) 50 MG tablet Take 200 mg by mouth daily.     traZODone (DESYREL) 150 MG tablet Take 150 mg by mouth at bedtime.     No current facility-administered medications for this visit.    Allergies as of 01/18/2023 - Review Complete 11/14/2022  Allergen Reaction Noted   Ibuprofen Other (See Comments) 10/25/2014   Levaquin [levofloxacin in d5w] Hives 10/25/2014   Sulfamethoxazole-trimethoprim Hives 04/09/2014   Nitroglycerin Other (See Comments) 10/25/2014    Past Medical History:  Diagnosis Date  Anxiety    Asthma    Complication of anesthesia    COPD (chronic obstructive pulmonary disease) (HCC)    diagnosed from pulmonary function tests   Depression    Diabetes mellitus without complication (HCC)    DVT (deep venous thrombosis) (HCC)    Dvt femoral (deep venous thrombosis) (HCC) 2018   in lung also.  treated with xarelto for 6 months   GERD (gastroesophageal reflux disease)    Hepatitis    AGE 76-HEPATITIS B   History of hiatal hernia    History of kidney stones 2019   History of methicillin resistant staphylococcus aureus (MRSA) 2013   in neck due to  ingrown hair. had surgery to drain cyst   History of palpitations 2019   due to a panic attack   Hypertension    Lung nodule 2019   being followed by dr. Orlie Dakin, dr Thelma Barge and dr. Belia Heman   Pneumonia 04/2016   PONV (postoperative nausea and vomiting)    NAUSEATED   Pulmonary emboli (HCC) 2018   Sleep apnea    CPAP   Vertigo     Past Surgical History:  Procedure Laterality Date   ABDOMINAL HYSTERECTOMY  1994   CESAREAN SECTION     CHOLECYSTECTOMY  2013   COLONOSCOPY WITH PROPOFOL N/A 04/20/2022   Procedure: COLONOSCOPY WITH PROPOFOL;  Surgeon: Toney Reil, MD;  Location: [facility] ENDOSCOPY;  Service: Gastroenterology;  Laterality: N/A;   CYST REMOVAL NECK  2013   was ingrown hair . positive for mrsa at that time prior to removal in OR   CYSTOSCOPY WITH URETEROSCOPY AND STENT PLACEMENT Right 05/31/2018   Procedure: CYSTOSCOPY WITH URETEROSCOPY, LASER LITHOTRIPSY AND STENT PLACEMENT;  Surgeon: Sondra Come, MD;  Location: [facility] ORS;  Service: Urology;  Laterality: Right;   DIAGNOSTIC LAPAROSCOPY     ELECTROMAGNETIC NAVIGATION BROCHOSCOPY N/A 08/15/2016   Procedure: ELECTROMAGNETIC NAVIGATION BRONCHOSCOPY;  Surgeon: Erin Fulling, MD;  Location: [facility] ORS;  Service: Cardiopulmonary;  Laterality: N/A;   ELECTROMAGNETIC NAVIGATION BROCHOSCOPY Right 11/07/2017   Procedure: ELECTROMAGNETIC NAVIGATION BRONCHOSCOPY;  Surgeon: Erin Fulling, MD;  Location: [facility];  Service: Cardiopulmonary;  Laterality: Right;   ESOPHAGOGASTRODUODENOSCOPY (EGD) WITH PROPOFOL N/A 04/20/2022   Procedure: ESOPHAGOGASTRODUODENOSCOPY (EGD) WITH PROPOFOL;  Surgeon: Toney Reil, MD;  Location: [facility];  Service: Gastroenterology;  Laterality: N/A;   FLEXIBLE SIGMOIDOSCOPY N/A 05/04/2022   Procedure: FLEXIBLE SIGMOIDOSCOPY;  Surgeon: Toney Reil, MD;  Location: [facility] ENDOSCOPY;  Service: Gastroenterology;  Laterality: N/A;   LAPAROSCOPIC GASTRIC SLEEVE RESECTION  2015   lost 75 pounds     Review of Systems:    All systems reviewed and negative except where noted in HPI.   Physical Examination:   There were no vitals taken for this visit.  General: Well-nourished, well-developed in no acute distress.  Lungs: Clear to auscultation bilaterally. Non-labored. Heart: Regular rate and rhythm, no murmurs rubs or gallops.  Abdomen: Bowel sounds are normal; Abdomen is Soft; No hepatosplenomegaly, masses or hernias;  No Abdominal Tenderness; No guarding or rebound tenderness. Neuro: Alert and oriented x 3.  Grossly intact.  Psych: Alert and cooperative, normal mood and affect.   Imaging Studies: No results found.  Assessment and Plan:   Andrea Pollard is a 67 y.o. y/o female Andrea Pollard is a 67 y.o. y/o female presents for follow-up of:   1.  Irritable bowel syndrome, mixed -mostly diarrhea with occasional constipation.  I instructed her to take dicyclomine 10 mg 1 tablet 3 times daily every day.             Start OTC Align probiotic 1 capsule once daily.             Start FiberCon 2 tablets once or twice daily with 8 ounces of water. Take OTC Imodium liquid or tablets as needed for diarrhea.  We discussed titrating dose based on bowel habits. Try low FODMAP diet, handout given.   2.  Bile Salt Diarrhea post CCY             If the above treatment does not help her diarrhea, consider trial of Colestid or cholestyramine.   3.  History of 10 mm sessile serrated colon polyp removed 02/2022.  Flex sig 04/2022 showed no residual.             Repeat colonoscopy in 1 year (04/2023).    Celso Amy, PA-C  Follow up ***  BP check ***

## 2023-01-18 ENCOUNTER — Ambulatory Visit: Payer: Managed Care, Other (non HMO) | Admitting: Physician Assistant

## 2023-01-18 ENCOUNTER — Encounter: Payer: Self-pay | Admitting: Physician Assistant

## 2023-02-23 ENCOUNTER — Ambulatory Visit
Admission: RE | Admit: 2023-02-23 | Discharge: 2023-02-23 | Disposition: A | Discharge status: Home / Self Care | Payer: Managed Care, Other (non HMO) | Source: Ambulatory Visit | Attending: Oncology | Admitting: Oncology

## 2023-02-23 DIAGNOSIS — R911 Solitary pulmonary nodule: Secondary | ICD-10-CM | POA: Insufficient documentation

## 2023-02-23 LAB — POCT I-STAT CREATININE: Creatinine, Ser: 0.8 mg/dL (ref 0.44–1.00)

## 2023-02-23 MED ORDER — IOHEXOL 300 MG/ML  SOLN
75.0000 mL | Freq: Once | INTRAMUSCULAR | Status: AC | PRN
  Administered 2023-02-23: 75 mL via INTRAVENOUS

## 2023-02-27 ENCOUNTER — Encounter: Payer: Self-pay | Admitting: Oncology

## 2023-02-27 ENCOUNTER — Inpatient Hospital Stay: Payer: Managed Care, Other (non HMO) | Attending: Oncology | Admitting: Oncology

## 2023-02-27 VITALS — BP 126/89 | HR 79 | Temp 98.8°F | Resp 16 | Ht 65.0 in | Wt 188.0 lb

## 2023-02-27 DIAGNOSIS — E041 Nontoxic single thyroid nodule: Secondary | ICD-10-CM | POA: Insufficient documentation

## 2023-02-27 DIAGNOSIS — Z86718 Personal history of other venous thrombosis and embolism: Secondary | ICD-10-CM | POA: Diagnosis not present

## 2023-02-27 DIAGNOSIS — R911 Solitary pulmonary nodule: Secondary | ICD-10-CM

## 2023-02-27 DIAGNOSIS — Z86711 Personal history of pulmonary embolism: Secondary | ICD-10-CM | POA: Diagnosis not present

## 2023-02-27 DIAGNOSIS — Z87891 Personal history of nicotine dependence: Secondary | ICD-10-CM | POA: Diagnosis not present

## 2023-02-27 DIAGNOSIS — R918 Other nonspecific abnormal finding of lung field: Secondary | ICD-10-CM | POA: Insufficient documentation

## 2023-02-27 NOTE — Progress Notes (Signed)
[name]  Telephone:([phone] Fax:(336) [phone]  ID: Andrea Pollard OB: [birth year]  MR#: [id number]  ION#:[id number]  Patient Care Team: Oswaldo Conroy, MD as PCP - General (Family Medicine) Mariah Milling Tollie Pizza, MD as Consulting Physician (Cardiology) Glory Buff, RN as Registered Nurse   CHIEF COMPLAINT: Mass of upper lobe of right lung, thyroid nodule.  INTERVAL HISTORY: Patient returns to clinic today for routine yearly evaluation and discussion of her imaging results.  She has fatigue which she attributes to taking care of her 70 year old mother, but otherwise feels well. She has no neurologic complaints. She denies any recent fevers or illnesses. She has a good appetite and denies weight loss.  She denies any chest pain, shortness of breath, cough, or hemoptysis.  She denies any nausea, vomiting, constipation, or diarrhea. She has no urinary complaints.  Patient offers no further specific complaints today.  REVIEW OF SYSTEMS:   Review of Systems  Constitutional: Negative.  Negative for fever, malaise/fatigue and weight loss.  Respiratory: Negative.  Negative for cough, hemoptysis and shortness of breath.   Cardiovascular: Negative.  Negative for chest pain and leg swelling.  Gastrointestinal: Negative.  Negative for abdominal pain.  Genitourinary: Negative.  Negative for dysuria.  Musculoskeletal: Negative.  Negative for back pain and neck pain.  Skin: Negative.  Negative for rash.  Neurological: Negative.  Negative for sensory change, focal weakness and weakness.  Psychiatric/Behavioral: Negative.  The patient is not nervous/anxious and does not have insomnia.     As per HPI. Otherwise, a complete review of systems is negative.  PAST MEDICAL HISTORY: Past Medical History:  Diagnosis Date   Anxiety    Asthma    Complication of anesthesia    COPD (chronic obstructive pulmonary disease) (HCC)    diagnosed from pulmonary function tests    Depression    Diabetes mellitus without complication (HCC)    DVT (deep venous thrombosis) (HCC)    Dvt femoral (deep venous thrombosis) (HCC) 2018   in lung also.  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 had surgery to drain cyst   History of palpitations 2019   due to a panic attack   Hypertension    Lung nodule 2019   being followed by dr. Orlie Dakin, dr Thelma Barge and dr. Belia Heman   Pneumonia 04/2016   PONV (postoperative nausea and vomiting)    NAUSEATED   Pulmonary emboli (HCC) 2018   Sleep apnea    CPAP   Vertigo     PAST SURGICAL HISTORY: Past Surgical History:  Procedure Laterality Date   ABDOMINAL HYSTERECTOMY  1994   CESAREAN SECTION     CHOLECYSTECTOMY  2013   COLONOSCOPY WITH PROPOFOL N/A 04/20/2022   Procedure: COLONOSCOPY WITH PROPOFOL;  Surgeon: Toney Reil, MD;  Location: [facility] ENDOSCOPY;  Service: Gastroenterology;  Laterality: N/A;   CYST REMOVAL NECK  2013   was ingrown hair . positive for mrsa at that time prior to removal in OR   CYSTOSCOPY WITH URETEROSCOPY AND STENT PLACEMENT Right 05/31/2018   Procedure: CYSTOSCOPY WITH URETEROSCOPY, LASER LITHOTRIPSY AND STENT PLACEMENT;  Surgeon: Sondra Come, MD;  Location: [facility] ORS;  Service: Urology;  Laterality: Right;   DIAGNOSTIC LAPAROSCOPY  ELECTROMAGNETIC NAVIGATION BROCHOSCOPY N/A 08/15/2016   Procedure: ELECTROMAGNETIC NAVIGATION BRONCHOSCOPY;  Surgeon: Erin Fulling, MD;  Location: [facility] ORS;  Service: Cardiopulmonary;  Laterality: N/A;   ELECTROMAGNETIC NAVIGATION BROCHOSCOPY Right 11/07/2017   Procedure: ELECTROMAGNETIC NAVIGATION BRONCHOSCOPY;  Surgeon: Erin Fulling, MD;  Location: [facility] ORS;  Service: Cardiopulmonary;  Laterality: Right;   ESOPHAGOGASTRODUODENOSCOPY (EGD)  WITH PROPOFOL N/A 04/20/2022   Procedure: ESOPHAGOGASTRODUODENOSCOPY (EGD) WITH PROPOFOL;  Surgeon: Toney Reil, MD;  Location: [facility] ENDOSCOPY;  Service: Gastroenterology;  Laterality: N/A;   FLEXIBLE SIGMOIDOSCOPY N/A 05/04/2022   Procedure: FLEXIBLE SIGMOIDOSCOPY;  Surgeon: Toney Reil, MD;  Location: [facility] ENDOSCOPY;  Service: Gastroenterology;  Laterality: N/A;   LAPAROSCOPIC GASTRIC SLEEVE RESECTION  2015   lost 75 pounds    FAMILY HISTORY: Family History  Problem Relation Age of Onset   Stroke Father    Ovarian cancer Maternal Grandmother    Breast cancer Neg Hx     ADVANCED DIRECTIVES (Y/N):  N  HEALTH MAINTENANCE: Social History   Tobacco Use   Smoking status: Former    Current packs/day: 0.00    Average packs/day: 1 pack/day for 5.0 years (5.0 ttl pk-yrs)    Types: Cigarettes    Start date: 08/12/1986    Quit date: 08/12/1991    Years since quitting: 31.5   Smokeless tobacco: Never  Vaping Use   Vaping status: Never Used  Substance Use Topics   Alcohol use: No   Drug use: No     Colonoscopy:  PAP:  Bone density:  Lipid panel:  Allergies  Allergen Reactions   Ibuprofen Other (See Comments)    Cannot Use due to Gastric SLEEVE SURGERY   Levaquin [Levofloxacin In D5w] Hives   Sulfamethoxazole-Trimethoprim Hives   Nitroglycerin Other (See Comments)    Severe Hypotension    Current Outpatient Medications  Medication Sig Dispense Refill   albuterol (VENTOLIN HFA) 108 (90 Base) MCG/ACT inhaler Inhale 1-2 puffs into the lungs every 6 (six) hours as needed. 18 g 0   atorvastatin (LIPITOR) 80 MG tablet Take 80 mg by mouth at bedtime.   2   dicyclomine (BENTYL) 10 MG capsule TAKE 1 CAPSULE(10 MG) BY MOUTH THREE TIMES DAILY BEFORE MEALS AS NEEDED 30 capsule 0   empagliflozin (JARDIANCE) 10 MG TABS tablet Take 10 mg by mouth daily.     FLOVENT HFA 220 MCG/ACT inhaler Inhale 2 puffs into the lungs daily.  5   lansoprazole (PREVACID) 15 MG capsule Take  15 mg by mouth at bedtime.      lisinopril (ZESTRIL) 10 MG tablet Take 10 mg by mouth daily.     LISINOPRIL PO      montelukast (SINGULAIR) 10 MG tablet Take 10 mg by mouth daily.     ondansetron (ZOFRAN) 4 MG tablet Take 1 tablet (4 mg total) by mouth every 8 (eight) hours as needed for nausea or vomiting. 14 tablet 0   ONETOUCH VERIO test strip TEST ONCE D 100 each 3   OZEMPIC, 0.25 OR 0.5 MG/DOSE, 2 MG/1.5ML SOPN Inject 0.5 mg as directed once a week.     sertraline (ZOLOFT) 100 MG tablet Take 2 tablets (200 mg total) by mouth daily.  3   sertraline (ZOLOFT) 50 MG tablet Take 200 mg by mouth daily.     traZODone (DESYREL) 150 MG tablet Take 150 mg by mouth at bedtime.     meclizine (ANTIVERT) 25 MG tablet  (Patient not taking: Reported on 02/27/2023)     No current facility-administered  medications for this visit.    OBJECTIVE: Vitals:   02/27/23 1313  BP: 126/89  Pulse: 79  Resp: 16  Temp: 98.8 F (37.1 C)  SpO2: 96%      Body mass index is 31.28 kg/m.    ECOG FS:0 - Asymptomatic  General: Well-developed, well-nourished, no acute distress. Eyes: Pink conjunctiva, anicteric sclera. HEENT: Normocephalic, moist mucous membranes. Lungs: No audible wheezing or coughing. Heart: Regular rate and rhythm. Abdomen: Soft, nontender, no obvious distention. Musculoskeletal: No edema, cyanosis, or clubbing. Neuro: Alert, answering all questions appropriately. Cranial nerves grossly intact. Skin: No rashes or petechiae noted. Psych: Normal affect.  LAB RESULTS:  Lab Results  Component Value Date   NA 139 09/14/2022   K 4.0 09/14/2022   CL 105 09/14/2022   CO2 26 09/14/2022   GLUCOSE 96 09/14/2022   BUN 15 09/14/2022   CREATININE 0.80 02/23/2023   CALCIUM 8.7 (L) 09/14/2022   PROT 7.0 09/14/2022   ALBUMIN 3.9 09/14/2022   AST 30 09/14/2022   ALT 18 09/14/2022   ALKPHOS 84 09/14/2022   BILITOT 0.9 09/14/2022   GFRNONAA >60 09/14/2022   GFRAA >60 12/07/2019    Lab  Results  Component Value Date   WBC 5.2 09/14/2022   NEUTROABS 3.2 09/14/2022   HGB 13.7 09/14/2022   HCT 42.3 09/14/2022   MCV 80.1 09/14/2022   PLT 165 09/14/2022     STUDIES: CT Chest W Contrast  Result Date: 02/27/2023 CLINICAL DATA:  Follow-up pulmonary nodule EXAM: CT CHEST WITH CONTRAST TECHNIQUE: Multidetector CT imaging of the chest was performed during intravenous contrast administration. RADIATION DOSE REDUCTION: This exam was performed according to the departmental dose-optimization program which includes automated exposure control, adjustment of the mA and/or kV according to patient size and/or use of iterative reconstruction technique. CONTRAST:  75mL OMNIPAQUE IOHEXOL 300 MG/ML  SOLN COMPARISON:  02/21/2022 chest CT FINDINGS: Cardiovascular: Normal heart size. No significant pericardial effusion/thickening. Three-vessel coronary atherosclerosis. Atherosclerotic nonaneurysmal thoracic aorta. Normal caliber pulmonary arteries. No central pulmonary emboli. Mediastinum/Nodes: Partially calcified 1.4 cm right thyroid nodule is stable. Unremarkable esophagus. No pathologically enlarged axillary, mediastinal or hilar lymph nodes. Lungs/Pleura: No pneumothorax. No pleural effusion. No acute consolidative airspace disease or lung masses. Dominant slightly spiculated solid 1.2 x 1.0 cm central right upper lobe pulmonary nodule on series 3/image 61, previously 1.2 x 1.0 cm on 02/21/2022 and 1.3 x 1.1 cm on 02/03/2019 CT using similar measurement technique, not appreciably changed. Numerous (greater than 10) smaller solid pulmonary nodules scattered throughout both lungs measuring up to 0.6 cm in the anterior right lower lobe (series 3/image 87), all either stable or decreased in the interval. Previously visualized 0.6 cm sub solid lingular pulmonary nodule from 02/21/2022 CT on series 3/image 73 is decreased to 0.2 cm on today's scan on series 3/image 71. No new significant pulmonary nodules. Upper  abdomen: Small hiatal hernia. Expected postsurgical changes from sleeve gastrectomy. Stomach is nondistended. Cholecystectomy. Musculoskeletal: No aggressive appearing focal osseous lesions. Moderate thoracic spondylosis. IMPRESSION: 1. Dominant slightly spiculated solid 1.2 cm central right upper lobe pulmonary nodule is not appreciably changed since at least 02/03/2019 CT. Numerous smaller pulmonary nodules scattered throughout both lungs measuring up to 0.6 cm, all either stable or decreased in the interval. Findings are presumably benign given long-term stability. No interval pulmonary disease. 2. Three-vessel coronary atherosclerosis. 3. Small hiatal hernia. 4.  Aortic Atherosclerosis (ICD10-I70.0). Electronically Signed   By: Delbert Phenix M.D.   On: 02/27/2023 13:32  ASSESSMENT: Mass of upper lobe of right lung  PLAN:    Mass of upper lobe of right lung: Patient underwent biopsy on November 07, 2017 that was negative for malignancy.  Her most recent CT scan on February 27, 2023 reviewed independently and reported as above with stable 1.2 cm lesion.  This has been unchanged for greater than 5 years.  No further intervention is needed.  Patient does not require any further routine imaging.  No further follow-up has been scheduled.   History of bilateral PE/left leg DVT: Resolved.  Patient has now completed anticoagulation.  Diagnosed by CT scan on November 28, 2016. Patient has no obvious transient risk factors. Hypercoagulable workup was negative except for an elevated DRVVT which can be attributed to the anticoagulation she is taking at that time. Work-up at [facility] was also reported as negative. Thyroid nodule: Likely benign.  FNA on April 26, 2020 was nondiagnostic.  I spent a total of 20 minutes reviewing chart data, face-to-face evaluation with the patient, counseling and coordination of care as detailed above.   Patient expressed understanding and was in agreement with this plan. She also understands  that She can call clinic at any time with any questions, concerns, or complaints.    Jeralyn Ruths, MD   02/27/2023 1:53 PM

## 2023-04-09 ENCOUNTER — Telehealth: Payer: Self-pay | Admitting: Gastroenterology

## 2023-04-09 MED ORDER — DICYCLOMINE HCL 10 MG PO CAPS: 10.0000 mg | ORAL_CAPSULE | Freq: Three times a day (TID) | ORAL | 0 refills | Status: DC

## 2023-04-09 NOTE — Telephone Encounter (Signed)
The patient is hurting real bad. She stated that her IBS is acting up. She said that she has diarrhea.

## 2023-04-09 NOTE — Addendum Note (Signed)
Addended by: Radene Knee L on: 04/09/2023 10:33 AM   Modules accepted: Orders

## 2023-04-09 NOTE — Telephone Encounter (Signed)
Patient was last seen by Inetta Fermo on 11/14/2022 IBS and no showed follow up appointment on 01/18/2023. Patient states she is been taking the Algin Probiotic daily and Fibercon 2 tablets twice a day. She states she did run out of the Fibercon 2 weeks ago and has not got anyone yet to go get her any more. She states it is hard for her to get someone to take her to the store to buy something or give them the money to go buy it for her. She states if you feel like that will help her symptoms she will have someone to go buy it soon. She states her symptoms began on Friday 04/06/23. She is having lower abdominal pain that is a cramping pain all the way across her lower stomach. She states the pain has been constant since yesterday. She has had diarrhea 4 times a day. Has had nausea but no vomiting. Denies any rectal bleeding. She would like to know what someone could eat with IBS with constipation and diarrhea that will not make her symptoms worse.

## 2023-04-09 NOTE — Telephone Encounter (Signed)
Patient verbalized understanding of instructions. She states she does not have any more of the dicyclomine Sent medication to the pharmacy. She will get some more Fibercon and sent the Lowfod map diet to [facility] she said she does not remember getting it at last appointment. She wanted to make a appointment. Made appointment for 05/09/2023

## 2023-04-30 ENCOUNTER — Telehealth: Payer: Self-pay

## 2023-04-30 NOTE — Telephone Encounter (Signed)
Patient left a message on my voicemail stating she needed to see her doctor today. She states she is having bad IBS symptoms. She states she is having abdominal pain across her stomach and back. She states her daughter states she can bring her today at 4:00pm. Patient has appointment with Inetta Fermo on 05/09/2023 she was last seen on 11/14/2022. She canceled appointment on 12/21/2022 and no showed appointment on 01/18/2023. She states she has not had a bowel movement in 5 days and states she took something over the counter but does not know the name of it. She states every since then she has had diarrhea. She has had a aching constant pain all the way across her stomach and lower back. She states it is not worse after she eats. Has had some nausea. Denies any rectal bleeding. Patient got her appointment moved up from 05/09/2023 to 05/04/2023. Please advise what you recommend till then

## 2023-04-30 NOTE — Telephone Encounter (Signed)
Called patient and she states she needs a refill on Dicyclomine. She states she just took it 3 times a day not before meals. I asked her If it helped her and she said yes and no. She said she took it every day. Informed her it is not a everyday medication it is just a as needed medication. She states how do I know when I need to take it. Informed her she just needed to talk to the provider on Friday and they will decide what medication is best for her. She agreed with plan

## 2023-04-30 NOTE — Telephone Encounter (Signed)
Not sure if she is taking Bentyl that was recommended by Inetta Fermo last time, if it is not helping, we can try hyoscyamine as needed for abdominal cramps 3-4 times daily, 30 pills only  RV

## 2023-05-03 NOTE — Progress Notes (Signed)
Celso Amy, PA-C [address]  [address]  [place], [place] [address]  Main: [phone]  Fax: [phone]   Primary Care Physician: Oswaldo Conroy, MD  Primary Gastroenterologist:  Celso Amy, PA-C / Dr. Lannette Donath    CC: F/U IBS - Mixed; Hx Large Colon Polyp  HPI: Andrea Pollard is a 67 y.o. female returns for 6 month f/u IBS with diarrhea alternating with Constipation.  Gen. Abd cramping and bloating.  GI sx all of her life since childhood.  Saw me 10/2022, 6 months ago.  Last saw Dr. Allegra Lai 03/2022.  We are performing televisit video visit today per patient request.  Patient does not drive and has limited transportation.  Xifaxan for IBS flare, was cost prohibitive.  Currently taking Dicyclomine 10 Mg 3 times daily which is not helping.  She denies adverse side effects of dicyclomine.  She was taking FiberCon 2 tablets twice daily which helped, however she ran out.  Currently taking align probiotic once daily.  She reports having episodes of diarrhea and constipation every day.  Continues to have generalized lower abdominal cramping.  She denies rectal bleeding.  She has had mucus in her stool.  She drinks a lot of milk.  He has never tried a low FODMAP diet.  She is afraid to go out anywhere due to diarrhea.  She does not drive.  Denies any alarm symptoms.  She denies nausea, vomiting, hematochezia, melena, or weight loss.  Had cholecystectomy many years ago.  She is frustrated about her IBS.  It limits her daily activities and social events.   03/2022 Colonoscopy: by Dr. Allegra Lai showed 10 mm sessile polyp removed from descending colon and a clip was placed.  Pathology showed sessile serrated polyp with focal adenomatous dysplasia.  Negative for malignancy.  Biopsies negative for microscopic colitis.  1 year repeat colonoscopy was recommended.   03/2022 EGD: medium hiatal hernia, normal stomach, duodenum, and esophagus.  Nonbleeding duodenal diverticulum.  Biopsies  negative for celiac and H. pylori.   04/2022 Stool Test:  Pancreatic elastase normal, GI pathogen panel negative. Labs 09/14/2022 -normal CMP, CBC, B12, TSH.  HIV negative.  H. pylori breath test negative.  08/2021 abdominal pelvic CT with contrast: No acute abnormality.  Current Outpatient Medications  Medication Sig Dispense Refill   albuterol (VENTOLIN HFA) 108 (90 Base) MCG/ACT inhaler Inhale 1-2 puffs into the lungs every 6 (six) hours as needed. 18 g 0   atorvastatin (LIPITOR) 80 MG tablet Take 80 mg by mouth at bedtime.   2   dicyclomine (BENTYL) 10 MG capsule Take 1 capsule (10 mg total) by mouth 3 (three) times daily before meals. 90 capsule 0   empagliflozin (JARDIANCE) 10 MG TABS tablet Take 10 mg by mouth daily.     FLOVENT HFA 220 MCG/ACT inhaler Inhale 2 puffs into the lungs daily.  5   lansoprazole (PREVACID) 15 MG capsule Take 15 mg by mouth at bedtime.      lisinopril (ZESTRIL) 10 MG tablet Take 10 mg by mouth daily.     LISINOPRIL PO      meclizine (ANTIVERT) 25 MG tablet  (Patient not taking: Reported on 02/27/2023)     montelukast (SINGULAIR) 10 MG tablet Take 10 mg by mouth daily.     ondansetron (ZOFRAN) 4 MG tablet Take 1 tablet (4 mg total) by mouth every 8 (eight) hours as needed for nausea or vomiting. 14 tablet 0   ONETOUCH VERIO test strip TEST ONCE  D 100 each 3   OZEMPIC, 0.25 OR 0.5 MG/DOSE, 2 MG/1.5ML SOPN Inject 0.5 mg as directed once a week.     sertraline (ZOLOFT) 100 MG tablet Take 2 tablets (200 mg total) by mouth daily.  3   sertraline (ZOLOFT) 50 MG tablet Take 200 mg by mouth daily.     traZODone (DESYREL) 150 MG tablet Take 150 mg by mouth at bedtime.     No current facility-administered medications for this visit.    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 had surgery to drain cyst   History of palpitations 2019   due to a panic attack   Hypertension    Lung nodule 2019   being followed by dr. Orlie Dakin, dr Thelma Barge and dr. Belia Heman   Pain in joint of left shoulder 01/15/2023   Pain of left hand 01/15/2023   Pneumonia 04/2016   PONV (postoperative nausea and vomiting)    NAUSEATED   Pulmonary emboli (HCC) 2018   Sleep apnea    CPAP   Tendinitis of left shoulder 01/15/2023   Vertigo     Past Surgical History:  Procedure Laterality Date   ABDOMINAL HYSTERECTOMY  1994   CESAREAN SECTION     CHOLECYSTECTOMY  2013   COLONOSCOPY WITH PROPOFOL N/A 04/20/2022   Procedure: COLONOSCOPY WITH PROPOFOL;  Surgeon: Toney Reil, MD;  Location: [facility] ENDOSCOPY;  Service: Gastroenterology;  Laterality: N/A;   CYST REMOVAL NECK  2013   was ingrown hair . positive for mrsa at that time prior to removal in OR   CYSTOSCOPY WITH URETEROSCOPY AND STENT PLACEMENT Right 05/31/2018   Procedure: CYSTOSCOPY WITH URETEROSCOPY, LASER LITHOTRIPSY AND STENT PLACEMENT;  Surgeon: Sondra Come, MD;  Location: [facility] ORS;  Service: Urology;  Laterality: Right;    DIAGNOSTIC LAPAROSCOPY     ELECTROMAGNETIC NAVIGATION BROCHOSCOPY N/A 08/15/2016   Procedure: ELECTROMAGNETIC NAVIGATION BRONCHOSCOPY;  Surgeon: Erin Fulling, MD;  Location: [facility] ORS;  Service: Cardiopulmonary;  Laterality: N/A;   ELECTROMAGNETIC NAVIGATION BROCHOSCOPY Right 11/07/2017   Procedure: ELECTROMAGNETIC NAVIGATION BRONCHOSCOPY;  Surgeon: Erin Fulling, MD;  Location: [facility];  Service: Cardiopulmonary;  Laterality: Right;   ESOPHAGOGASTRODUODENOSCOPY (EGD) WITH PROPOFOL N/A 04/20/2022   Procedure: ESOPHAGOGASTRODUODENOSCOPY (EGD) WITH PROPOFOL;  Surgeon: Toney Reil, MD;  Location: [facility] END[facility]COPY;  Service: Gastroenterology;  Laterality: N/A;   FLEXIBLE SIGMOIDOSCOPY N/A 05/04/2022   Procedure: FLEXIBLE SIGMOIDOSCOPY;  Surgeon: Toney Reil, MD;  Location: [facility] ENDOSCOPY;  Service: Gastroenterology;  Laterality: N/A;   LAPAROSCOPIC GASTRIC SLEEVE RESECTION  2015   lost 75 pounds    Review of Systems:    All systems reviewed and negative except where noted in HPI.   Physical Examination:   There were no vitals taken for this visit.  General: Well-nourished, well-developed in no acute distress.  Neuro: Alert and oriented x 3.  Grossly intact.  Psych: Alert and cooperative, normal mood and affect.   Imaging Studies: No results found.  Assessment and Plan:   Andrea Pollard is a 67 y.o. y/o female returns for f/u of:  1.  Irritable bowel syndrome, mixed.  She has diarrhea, constipation, abdominal cramping and bloating.  She has had IBS all of her life since childhood. Plan: -Increase dicyclomine to 20 Mg 3 times daily, #90, 3 refills. -Continue Align probiotic 1 capsule once daily -Start low FODMAP diet.  Verbal and written instructions given.  Avoid milk and dairy.  Switch to Lactaid and almond milk.  Avoid high fructose corn syrup. -Restart FiberCon tablets 2 tablets twice daily. -Take OTC Imodium as needed for diarrhea.  2.  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  Scheduling repeat colonoscopy I discussed risks of colonoscopy with patient to include risk of bleeding, colon perforation, and risk of sedation.  Patient expressed understanding and agrees to proceed with colonoscopy.   NOTE: Video Time for televisit today was 20 minutes, 30 seconds.  Total time spent reviewing chart, talking with patient, discussing treatment plan, giving patient education, and completing chart note was 55 minutes.  Celso Amy, PA-C  Follow up in 3 months for IBS - OK for Televisit.

## 2023-05-04 ENCOUNTER — Encounter: Payer: Self-pay | Admitting: Physician Assistant

## 2023-05-04 ENCOUNTER — Telehealth: Payer: Managed Care, Other (non HMO) | Admitting: Physician Assistant

## 2023-05-04 DIAGNOSIS — Z860101 Personal history of adenomatous and serrated colon polyps: Secondary | ICD-10-CM | POA: Diagnosis not present

## 2023-05-04 DIAGNOSIS — K582 Mixed irritable bowel syndrome: Secondary | ICD-10-CM | POA: Diagnosis not present

## 2023-05-04 DIAGNOSIS — Z860100 Personal history of colon polyps, unspecified: Secondary | ICD-10-CM

## 2023-05-04 MED ORDER — DICYCLOMINE HCL 20 MG PO TABS: 20.0000 mg | ORAL_TABLET | Freq: Three times a day (TID) | ORAL | 3 refills | Status: DC

## 2023-05-04 NOTE — Addendum Note (Signed)
Addended by: Martyn Ehrich on: 05/04/2023 11:23 AM   Modules accepted: Orders

## 2023-05-04 NOTE — Patient Instructions (Signed)
Plan to Treat Irritable Bowel Syndrome: -Increase dicyclomine to 20 Mg 3 times daily, #90, 3 refills. -Continue Align probiotic 1 capsule once daily -Start Low FODMAP diet.  Verbal and written instructions given.  Avoid milk and dairy.  Switch to Lactaid and almond milk.  Avoid high fructose corn syrup. -Restart FiberCon tablets 2 tablets twice daily. -Take OTC Imodium as needed for diarrhea.

## 2023-05-09 ENCOUNTER — Ambulatory Visit: Payer: Managed Care, Other (non HMO) | Admitting: Physician Assistant

## 2023-06-06 ENCOUNTER — Telehealth: Payer: Self-pay

## 2023-06-06 MED ORDER — GOLYTELY 236 G PO SOLR: 4000.0000 mL | Freq: Once | ORAL | 0 refills | Status: AC

## 2023-06-06 NOTE — Telephone Encounter (Signed)
 Patient is calling because she states she scheduled a colonoscopy on 05/04/2023 and the prep was supposed to be called in for her. She states she went to pick it up and they said they do not have it. Sent it to the pharmacy because colonoscopy is schedule for tomorrow

## 2023-06-07 ENCOUNTER — Ambulatory Visit: Payer: Managed Care, Other (non HMO) | Admitting: Anesthesiology

## 2023-06-07 ENCOUNTER — Encounter: Payer: Self-pay | Admitting: Gastroenterology

## 2023-06-07 ENCOUNTER — Other Ambulatory Visit: Payer: Self-pay

## 2023-06-07 ENCOUNTER — Ambulatory Visit
Admission: RE | Admit: 2023-06-07 | Discharge: 2023-06-07 | Disposition: A | Discharge status: Home / Self Care | Payer: Managed Care, Other (non HMO) | Attending: Gastroenterology | Admitting: Gastroenterology

## 2023-06-07 ENCOUNTER — Encounter
Admission: RE | Disposition: A | Discharge status: Home / Self Care | Payer: Self-pay | Source: Home / Self Care | Attending: Gastroenterology

## 2023-06-07 DIAGNOSIS — Z7985 Long-term (current) use of injectable non-insulin antidiabetic drugs: Secondary | ICD-10-CM | POA: Diagnosis not present

## 2023-06-07 DIAGNOSIS — Z1211 Encounter for screening for malignant neoplasm of colon: Secondary | ICD-10-CM | POA: Diagnosis present

## 2023-06-07 DIAGNOSIS — Z7984 Long term (current) use of oral hypoglycemic drugs: Secondary | ICD-10-CM | POA: Insufficient documentation

## 2023-06-07 DIAGNOSIS — E119 Type 2 diabetes mellitus without complications: Secondary | ICD-10-CM | POA: Diagnosis not present

## 2023-06-07 DIAGNOSIS — K582 Mixed irritable bowel syndrome: Secondary | ICD-10-CM

## 2023-06-07 DIAGNOSIS — Z87891 Personal history of nicotine dependence: Secondary | ICD-10-CM | POA: Insufficient documentation

## 2023-06-07 DIAGNOSIS — J45909 Unspecified asthma, uncomplicated: Secondary | ICD-10-CM | POA: Diagnosis not present

## 2023-06-07 DIAGNOSIS — Z860101 Personal history of adenomatous and serrated colon polyps: Secondary | ICD-10-CM | POA: Diagnosis not present

## 2023-06-07 DIAGNOSIS — G4730 Sleep apnea, unspecified: Secondary | ICD-10-CM | POA: Diagnosis not present

## 2023-06-07 DIAGNOSIS — I10 Essential (primary) hypertension: Secondary | ICD-10-CM | POA: Insufficient documentation

## 2023-06-07 DIAGNOSIS — K219 Gastro-esophageal reflux disease without esophagitis: Secondary | ICD-10-CM | POA: Diagnosis not present

## 2023-06-07 DIAGNOSIS — Z860100 Personal history of colon polyps, unspecified: Secondary | ICD-10-CM

## 2023-06-07 HISTORY — PX: COLONOSCOPY WITH PROPOFOL: SHX5780

## 2023-06-07 LAB — GLUCOSE, CAPILLARY: Glucose-Capillary: 90 mg/dL (ref 70–99)

## 2023-06-07 SURGERY — COLONOSCOPY WITH PROPOFOL
Anesthesia: General

## 2023-06-07 MED ORDER — PROPOFOL 500 MG/50ML IV EMUL
INTRAVENOUS | Status: DC | PRN
  Administered 2023-06-07: 75 ug/kg/min via INTRAVENOUS

## 2023-06-07 MED ORDER — LIDOCAINE HCL (CARDIAC) PF 100 MG/5ML IV SOSY
PREFILLED_SYRINGE | INTRAVENOUS | Status: DC | PRN
  Administered 2023-06-07: 80 mg via INTRAVENOUS

## 2023-06-07 MED ORDER — SODIUM CHLORIDE 0.9 % IV SOLN
INTRAVENOUS | Status: DC
Start: 2023-06-07 — End: 2023-06-07

## 2023-06-07 MED ORDER — PROPOFOL 10 MG/ML IV BOLUS
INTRAVENOUS | Status: DC | PRN
  Administered 2023-06-07: 50 mg via INTRAVENOUS
  Administered 2023-06-07: 30 mg via INTRAVENOUS

## 2023-06-07 MED ORDER — DEXMEDETOMIDINE HCL IN NACL 80 MCG/20ML IV SOLN
INTRAVENOUS | Status: DC | PRN
  Administered 2023-06-07: 20 ug via INTRAVENOUS

## 2023-06-07 NOTE — Anesthesia Postprocedure Evaluation (Signed)
Anesthesia Post Note  Patient: Andrea Pollard  Procedure(s) Performed: COLONOSCOPY WITH PROPOFOL  Patient location during evaluation: PACU Anesthesia Type: General Level of consciousness: awake and alert Pain management: pain level controlled Vital Signs Assessment: post-procedure vital signs reviewed and stable Respiratory status: spontaneous breathing, nonlabored ventilation and respiratory function stable Cardiovascular status: blood pressure returned to baseline and stable Postop Assessment: no apparent nausea or vomiting Anesthetic complications: no   No notable events documented.   Last Vitals:  Vitals:   06/07/23 1044 06/07/23 1054  BP:    Pulse: 69 67  Resp:  20  Temp:    SpO2: 99% 99%    Last Pain:  Vitals:   06/07/23 1054  TempSrc:   PainSc: 0-No pain                 Foye Deer

## 2023-06-07 NOTE — Op Note (Signed)
[facility] Gastroenterology Patient Name: Andrea Pollard Procedure Date: 06/07/2023 9:42 AM MRN: [id number] Account #: [id number] Date of Birth: 10/05/1955 Admit Type: Outpatient Age: 68 Room: [facility] ENDO ROOM 3 Gender: Female Note Status: Finalized Instrument Name: Nelda Marseille [id number] Procedure:             Colonoscopy Indications:           Surveillance: Piecemeal removal of large sessile                         adenoma last colonoscopy (< 3 yrs), Last colonoscopy:                         November 2023 Providers:             Toney Reil MD, MD Referring MD:          Oswaldo Done. Hessie Diener MD, MD (Referring MD) Medicines:             General Anesthesia Complications:         No immediate complications. Estimated blood loss: None. Procedure:             Pre-Anesthesia Assessment:                        - Prior to the procedure, a History and Physical was                         performed, and patient medications and allergies were                         reviewed. The patient is competent. The risks and                         benefits of the procedure and the sedation options and                         risks were discussed with the patient. All questions                         were answered and informed consent was obtained.                         Patient identification and proposed procedure were                         verified by the physician, the nurse, the                         anesthesiologist, the anesthetist and the technician                         in the pre-procedure area in the procedure room in the                         endoscopy suite. Mental Status Examination: alert and                         oriented. Airway Examination: normal oropharyngeal  airway and neck mobility. Respiratory Examination:                         clear to auscultation. CV Examination: normal.                         Prophylactic Antibiotics: The  patient does not require                         prophylactic antibiotics. Prior Anticoagulants: The                         patient has taken no anticoagulant or antiplatelet                         agents. ASA Grade Assessment: II - A patient with mild                         systemic disease. After reviewing the risks and                         benefits, the patient was deemed in satisfactory                         condition to undergo the procedure. The anesthesia                         plan was to use general anesthesia. Immediately prior                         to administration of medications, the patient was                         re-assessed for adequacy to receive sedatives. The                         heart rate, respiratory rate, oxygen saturations,                         blood pressure, adequacy of pulmonary ventilation, and                         response to care were monitored throughout the                         procedure. The physical status of the patient was                         re-assessed after the procedure.                        After obtaining informed consent, the colonoscope was                         passed under direct vision. Throughout the procedure,                         the patient's blood pressure, pulse, and oxygen  saturations were monitored continuously. The                         Colonoscope was introduced through the anus and                         advanced to the the cecum, identified by appendiceal                         orifice and ileocecal valve. The colonoscopy was                         performed with moderate difficulty due to restricted                         mobility of the colon. Successful completion of the                         procedure was aided by applying abdominal pressure.                         The patient tolerated the procedure well. The quality                         of the bowel  preparation was evaluated using the BBPS                         [facility] Bowel Preparation Scale) with scores of: Right                         Colon = 3, Transverse Colon = 3 and Left Colon = 3                         (entire mucosa seen well with no residual staining,                         small fragments of stool or opaque liquid). The total                         BBPS score equals 9. The ileocecal valve, appendiceal                         orifice, and rectum were photographed. Findings:      The perianal and digital rectal examinations were normal. Pertinent       negatives include normal sphincter tone and no palpable rectal lesions.      The entire examined colon appeared normal.      The retroflexed view of the distal rectum and anal verge was normal and       showed no anal or rectal abnormalities.      A tattoo was seen in the descending colon. A post-polypectomy scar was       found at the tattoo site. There was no evidence of residual polyp tissue. Impression:            - The entire examined colon is normal.                        -  The distal rectum and anal verge are normal on                         retroflexion view.                        - A tattoo was seen in the descending colon. A                         post-polypectomy scar was found at the tattoo site.                         There was no evidence of residual polyp tissue.                        - No specimens collected. Recommendation:        - Discharge patient to home (with escort).                        - Resume previous diet today.                        - Continue present medications.                        - Repeat colonoscopy in 5 years for surveillance. Procedure Code(s):     --- Professional ---                        O9629, Colorectal cancer screening; colonoscopy on                         individual at high risk Diagnosis Code(s):     --- Professional ---                        Z86.010, Personal  history of colonic polyps CPT copyright 2022 American Medical Association. All rights reserved. The codes documented in this report are preliminary and upon coder review may  be revised to meet current compliance requirements. Dr. Libby Maw Toney Reil MD, MD 06/07/2023 10:33:23 AM This report has been signed electronically. Number of Addenda: 0 Note Initiated On: 06/07/2023 9:42 AM Scope Withdrawal Time: 0 hours 16 minutes 44 seconds  Total Procedure Duration: 0 hours 23 minutes 7 seconds  Estimated Blood Loss:  Estimated blood loss: none.      [facility]

## 2023-06-07 NOTE — Anesthesia Preprocedure Evaluation (Signed)
Anesthesia Evaluation  Patient identified by MRN, date of birth, ID band Patient awake    Reviewed: Allergy & Precautions, H&P , NPO status , Patient's Chart, lab work & pertinent test results  History of Anesthesia Complications (+) PONV and history of anesthetic complications  Airway Mallampati: II  TM Distance: >3 FB Neck ROM: full    Dental no notable dental hx.    Pulmonary asthma , sleep apnea , former smoker   Pulmonary exam normal        Cardiovascular Exercise Tolerance: Good hypertension, (-) angina + CAD  Normal cardiovascular exam     Neuro/Psych negative neurological ROS  negative psych ROS   GI/Hepatic Neg liver ROS, hiatal hernia,GERD  Controlled,,  Endo/Other  diabetes    Renal/GU negative Renal ROS  negative genitourinary   Musculoskeletal   Abdominal   Peds  Hematology negative hematology ROS (+)   Anesthesia Other Findings Past Medical History: 01/15/2023: Acute pain of left wrist No date: Anxiety 01/15/2023: Arthritis of left acromioclavicular joint 01/15/2023: Arthritis of left wrist No date: Asthma No date: Complication of anesthesia No date: COPD (chronic obstructive pulmonary disease) (HCC)       Comment:  diagnosed from pulmonary function tests No date: Depression No date: Diabetes mellitus without complication (HCC) No date: DVT (deep venous thrombosis) (HCC) 2018: Dvt femoral (deep venous thrombosis) (HCC)     Comment:  in lung also.  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 had surgery to drain cyst 2019: History of palpitations     Comment:  due to a panic attack No date: Hypertension 2019: Lung nodule      Comment:  being followed by dr. Orlie Dakin, dr Thelma Barge and dr. Belia Heman 01/15/2023: Pain in joint of left shoulder 01/15/2023: Pain of left hand 04/2016: Pneumonia No date: PONV (postoperative nausea and vomiting)     Comment:  NAUSEATED 2018: Pulmonary emboli (HCC) No date: Sleep apnea     Comment:  CPAP 01/15/2023: Tendinitis of left shoulder No date: Vertigo  Past Surgical History: 1994: ABDOMINAL HYSTERECTOMY No date: CESAREAN SECTION 2013: CHOLECYSTECTOMY 04/20/2022: COLONOSCOPY WITH PROPOFOL; N/A     Comment:  Procedure: COLONOSCOPY WITH PROPOFOL;  Surgeon: Toney Reil, MD;  Location: [facility] ENDOSCOPY;  Service:               Gastroenterology;  Laterality: N/A; 2013: CYST REMOVAL NECK     Comment:  was ingrown hair . positive for mrsa at that time prior               to removal in OR 05/31/2018: CYSTOSCOPY WITH URETEROSCOPY AND STENT PLACEMENT; Right     Comment:  Procedure: CYSTOSCOPY WITH URETEROSCOPY, LASER               LITHOTRIPSY AND STENT PLACEMENT;  Surgeon: Sondra Come, MD;  Location: [facility] ORS;  Service: Urology;                Laterality: Right; No date: DIAGNOSTIC LAPAROSCOPY 08/15/2016: ELECTROMAGNETIC NAVIGATION BROCHOSCOPY; N/A     Comment:  Procedure:  ELECTROMAGNETIC NAVIGATION BRONCHOSCOPY;                Surgeon: Erin Fulling, MD;  Location: [facility] ORS;  Service:               Cardiopulmonary;  Laterality: N/A; 11/07/2017: ELECTROMAGNETIC NAVIGATION BROCHOSCOPY; Right     Comment:  Procedure: ELECTROMAGNETIC NAVIGATION BRONCHOSCOPY;                Surgeon: Erin Fulling, MD;  Location: [facility] ORS;  Service:              Cardiopulmonary;  Laterality: Right; 04/20/2022: ESOPHAGOGASTRODUODENOSCOPY (EGD) WITH PROPOFOL; N/A     Comment:  Procedure: ESOPHAGOGASTRODUODENOSCOPY (EGD) WITH               PROPOFOL;  Surgeon: Toney Reil, MD;  Location:               [facility] ENDOSCOPY;  Service: Gastroenterology;  Laterality:                N/A; 05/04/2022: FLEXIBLE SIGMOIDOSCOPY; N/A     Comment:  Procedure: FLEXIBLE SIGMOIDOSCOPY;  Surgeon: Toney Reil, MD;  Location: [facility] ENDOSCOPY;  Service:               Gastroenterology;  Laterality: N/A; 2015: LAPAROSCOPIC GASTRIC SLEEVE RESECTION     Comment:  lost 75 pounds  BMI    Body Mass Index: 30.79 kg/m      Reproductive/Obstetrics negative OB ROS                             Anesthesia Physical Anesthesia Plan  ASA: 2  Anesthesia Plan: General   Post-op Pain Management: Minimal or no pain anticipated   Induction: Intravenous  PONV Risk Score and Plan: Propofol infusion and TIVA  Airway Management Planned: Natural Airway  Additional Equipment:   Intra-op Plan:   Post-operative Plan:   Informed Consent: I have reviewed the patients History and Physical, chart, labs and discussed the procedure including the risks, benefits and alternatives for the proposed anesthesia with the patient or authorized representative who has indicated his/her understanding and acceptance.     Dental Advisory Given  Plan Discussed with: CRNA and Surgeon  Anesthesia Plan Comments:        Anesthesia Quick Evaluation

## 2023-06-07 NOTE — H&P (Signed)
Arlyss Repress, MD [address]  [address]  [place], [place] [address]  Main: [phone]  Fax: [phone] Pager: [phone]  Primary Care Physician:  Oswaldo Conroy, MD Primary Gastroenterologist:  Dr. Arlyss Repress  Pre-Procedure History & Physical: HPI:  Andrea Pollard is a 68 y.o. female is here for an colonoscopy.   Past Medical History:  Diagnosis Date   Acute pain of left wrist 01/15/2023   Anxiety    Arthritis of left acromioclavicular joint 01/15/2023   Arthritis of left wrist 01/15/2023   Asthma    Complication of anesthesia    COPD (chronic obstructive pulmonary disease) (HCC)    diagnosed from pulmonary function tests   Depression    Diabetes mellitus without complication (HCC)    DVT (deep venous thrombosis) (HCC)    Dvt femoral (deep venous thrombosis) (HCC) 2018   in lung also.  treated with xarelto for 6 months   GERD (gastroesophageal reflux disease)    Hepatitis    AGE 72-HEPATITIS B   History of hiatal hernia    History of kidney stones 2019   History of methicillin resistant staphylococcus aureus (MRSA) 2013   in neck due to ingrown hair. had surgery to drain cyst   History of palpitations 2019   due to a panic attack   Hypertension    Lung nodule 2019   being followed by dr. Orlie Dakin, dr Thelma Barge and dr. Belia Heman   Pain in joint of left shoulder 01/15/2023   Pain of left hand 01/15/2023   Pneumonia 04/2016   PONV (postoperative nausea and vomiting)    NAUSEATED   Pulmonary emboli (HCC) 2018   Sleep apnea    CPAP   Tendinitis of left shoulder 01/15/2023   Vertigo     Past Surgical History:  Procedure Laterality Date   ABDOMINAL HYSTERECTOMY  1994   CESAREAN SECTION     CHOLECYSTECTOMY  2013   COLONOSCOPY WITH PROPOFOL N/A 04/20/2022   Procedure: COLONOSCOPY WITH PROPOFOL;  Surgeon: Toney Reil, MD;  Location: [facility] ENDOSCOPY;  Service: Gastroenterology;  Laterality: N/A;   CYST REMOVAL NECK  2013   was ingrown hair .  positive for mrsa at that time prior to removal in OR   CYSTOSCOPY WITH URETEROSCOPY AND STENT PLACEMENT Right 05/31/2018   Procedure: CYSTOSCOPY WITH URETEROSCOPY, LASER LITHOTRIPSY AND STENT PLACEMENT;  Surgeon: Sondra Come, MD;  Location: [facility] ORS;  Service: Urology;  Laterality: Right;   DIAGNOSTIC LAPAROSCOPY     ELECTROMAGNETIC NAVIGATION BROCHOSCOPY N/A 08/15/2016   Procedure: ELECTROMAGNETIC NAVIGATION BRONCHOSCOPY;  Surgeon: Erin Fulling, MD;  Location: [facility] ORS;  Service: Cardiopulmonary;  Laterality: N/A;   ELECTROMAGNETIC NAVIGATION BROCHOSCOPY Right 11/07/2017   Procedure: ELECTROMAGNETIC NAVIGATION BRONCHOSCOPY;  Surgeon: Erin Fulling, MD;  Location: [facility] ORS;  Service: Cardiopulmonary;  Laterality: Right;   ESOPHAGOGASTRODUODENOSCOPY (EGD) WITH PROPOFOL N/A 04/20/2022   Procedure: ESOPHAGOGASTRODUODENOSCOPY (EGD) WITH PROPOFOL;  Surgeon: Toney Reil, MD;  Location: [facility] ENDOSCOPY;  Service: Gastroenterology;  Laterality: N/A;   FLEXIBLE SIGMOIDOSCOPY N/A 05/04/2022   Procedure: FLEXIBLE SIGMOIDOSCOPY;  Surgeon: Toney Reil, MD;  Location: [facility] ENDOSCOPY;  Service: Gastroenterology;  Laterality: N/A;   LAPAROSCOPIC GASTRIC SLEEVE RESECTION  2015   lost 75 pounds    Prior to Admission medications   Medication Sig Start Date End Date Taking? Authorizing Provider  albuterol (VENTOLIN HFA) 108 (90 Base) MCG/ACT inhaler Inhale 1-2 puffs into the lungs every 6 (six) hours as needed. 03/30/22   Mickie Bail,  NP  atorvastatin (LIPITOR) 80 MG tablet Take 80 mg by mouth at bedtime.  08/30/16   [provider]  dicyclomine (BENTYL) 20 MG tablet Take 1 tablet (20 mg total) by mouth 3 (three) times daily before meals. 05/04/23 09/01/23  Celso Amy, PA-C  empagliflozin (JARDIANCE) 10 MG TABS tablet Take 10 mg by mouth daily.    [provider]  FLOVENT HFA 220 MCG/ACT inhaler Inhale 2 puffs into the lungs daily. 12/11/16   [provider]   lansoprazole (PREVACID) 15 MG capsule Take 15 mg by mouth at bedtime.     [provider]  lisinopril (ZESTRIL) 10 MG tablet Take 10 mg by mouth daily. 04/28/22   [provider]  LISINOPRIL PO     [provider]  meclizine (ANTIVERT) 25 MG tablet     [provider]  montelukast (SINGULAIR) 10 MG tablet Take 10 mg by mouth daily.    [provider]  ondansetron (ZOFRAN) 4 MG tablet Take 1 tablet (4 mg total) by mouth every 8 (eight) hours as needed for nausea or vomiting. 04/19/22   Toney Reil, MD  [facility] VERIO test strip TEST ONCE D 09/21/18   Jeanmarie Plant, MD  OZEMPIC, 0.25 OR 0.5 MG/DOSE, 2 MG/1.5ML SOPN Inject 0.5 mg as directed once a week. 08/19/20   [provider]  sertraline (ZOLOFT) 100 MG tablet Take 2 tablets (200 mg total) by mouth daily. 09/15/22   Wouk, Wilfred Curtis, MD  sertraline (ZOLOFT) 50 MG tablet Take 200 mg by mouth daily. 12/12/22   [provider]  traZODone (DESYREL) 150 MG tablet Take 150 mg by mouth at bedtime.    [provider]    Allergies as of 05/04/2023 - Review Complete 05/04/2023  Allergen Reaction Noted   Ibuprofen Other (See Comments) 10/25/2014   Levaquin [levofloxacin in d5w] Hives 10/25/2014   Sulfamethoxazole-trimethoprim Hives 04/09/2014   Nitroglycerin Other (See Comments) 10/25/2014    Family History  Problem Relation Age of Onset   Stroke Father    Ovarian cancer Maternal Grandmother    Breast cancer Neg Hx     Social History   Socioeconomic History   Marital status: Divorced    Spouse name: Not on file   Number of children: Not on file   Years of education: Not on file   Highest education level: Not on file  Occupational History   Not on file  Tobacco Use   Smoking status: Former    Current packs/day: 0.00    Average packs/day: 1 pack/day for 5.0 years (5.0 ttl pk-yrs)    Types: Cigarettes    Start date: 08/12/1986    Quit date: 08/12/1991     Years since quitting: 31.8   Smokeless tobacco: Never  Vaping Use   Vaping status: Never Used  Substance and Sexual Activity   Alcohol use: No   Drug use: No   Sexual activity: Not on file  Other Topics Concern   Not on file  Social History Narrative   Not on file   Social Drivers of Health   Financial Resource Strain: Low Risk  (10/22/2019)   Received from [facility], [facility]   Overall Financial Resource Strain (CARDIA)    Difficulty of Paying Living Expenses: Not hard at all  Food Insecurity: No Food Insecurity (09/14/2022)   Hunger Vital Sign    Worried About Running Out of Food in the Last Year: Never true    Ran  Out of Food in the Last Year: Never true  Transportation Needs: No Transportation Needs (09/14/2022)   PRAPARE - Administrator, Civil Service (Medical): No    Lack of Transportation (Non-Medical): No  Physical Activity: Insufficiently Active (10/22/2019)   Received from [facility], [facility]   Exercise Vital Sign    Days of Exercise per Week: 1 day    Minutes of Exercise per Session: 10 min  Stress: No Stress Concern Present (10/22/2019)   Received from [facility], [facility] of Occupational Health - Occupational Stress Questionnaire    Feeling of Stress : Not at all  Social Connections: Moderately Isolated (10/22/2019)   Received from [facility], [facility]   Social Connection and Isolation Panel [NHANES]    Frequency of Communication with Friends and Family: More than three times a week    Frequency of Social Gatherings with Friends and Family: More than three times a week    Attends Religious Services: More than 4 times per year    Active Member of [employer] or Organizations: No    Attends Banker Meetings: Never    Marital Status: Divorced  Catering manager Violence: Not At Risk (09/14/2022)   Humiliation, Afraid, Rape, and Kick questionnaire    Fear of Current or Ex-Partner: No     Emotionally Abused: No    Physically Abused: No    Sexually Abused: No    Review of Systems: See HPI, otherwise negative ROS  Physical Exam: BP (!) 158/80   Pulse 75   Temp 97.6 F (36.4 C) (Temporal)   Wt 83.9 kg   SpO2 99%   BMI 30.79 kg/m  General:   Alert,  pleasant and cooperative in NAD Head:  Normocephalic and atraumatic. Neck:  Supple; no masses or thyromegaly. Lungs:  Clear throughout to auscultation.    Heart:  Regular rate and rhythm. Abdomen:  Soft, nontender and nondistended. Normal bowel sounds, without guarding, and without rebound.   Neurologic:  Alert and  oriented x4;  grossly normal neurologically.  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  Questions have been answered.  All parties agreeable.   Lannette Donath, MD  06/07/2023, 9:46 AM

## 2023-06-07 NOTE — Transfer of Care (Signed)
Immediate Anesthesia Transfer of Care Note  Patient: Andrea Pollard  Procedure(s) Performed: COLONOSCOPY WITH PROPOFOL  Patient Location: PACU  Anesthesia Type:General  Level of Consciousness: sedated  Airway & Oxygen Therapy: Patient Spontanous Breathing and Patient connected to nasal cannula oxygen  Post-op Assessment: Report given to RN and Post -op Vital signs reviewed and stable  Post vital signs: Reviewed and stable  Last Vitals:  Vitals Value Taken Time  BP 87/42 06/07/23 1034  Temp 36.1 C 06/07/23 1034  Pulse 63 06/07/23 1035  Resp 16 06/07/23 1035  SpO2 95 % 06/07/23 1035  Vitals shown include unfiled device data.  Last Pain:  Vitals:   06/07/23 1034  TempSrc: Temporal  PainSc: Asleep         Complications: No notable events documented.

## 2023-06-08 ENCOUNTER — Encounter: Payer: Self-pay | Admitting: Gastroenterology

## 2023-07-17 ENCOUNTER — Ambulatory Visit (INDEPENDENT_AMBULATORY_CARE_PROVIDER_SITE_OTHER): Payer: Managed Care, Other (non HMO)

## 2023-07-17 ENCOUNTER — Ambulatory Visit
Admission: EM | Admit: 2023-07-17 | Discharge: 2023-07-17 | Disposition: A | Discharge status: Home / Self Care | Payer: Managed Care, Other (non HMO)

## 2023-07-17 DIAGNOSIS — R051 Acute cough: Secondary | ICD-10-CM | POA: Insufficient documentation

## 2023-07-17 DIAGNOSIS — Z20828 Contact with and (suspected) exposure to other viral communicable diseases: Secondary | ICD-10-CM | POA: Diagnosis not present

## 2023-07-17 DIAGNOSIS — J441 Chronic obstructive pulmonary disease with (acute) exacerbation: Secondary | ICD-10-CM | POA: Diagnosis not present

## 2023-07-17 DIAGNOSIS — R5383 Other fatigue: Secondary | ICD-10-CM | POA: Insufficient documentation

## 2023-07-17 LAB — RESP PANEL BY RT-PCR (FLU A&B, COVID) ARPGX2
Influenza A by PCR: NEGATIVE
Influenza B by PCR: NEGATIVE
SARS Coronavirus 2 by RT PCR: NEGATIVE

## 2023-07-17 MED ORDER — DOXYCYCLINE HYCLATE 100 MG PO CAPS
100.0000 mg | ORAL_CAPSULE | Freq: Two times a day (BID) | ORAL | 0 refills | Status: AC
Start: 2023-07-17 — End: 2023-07-22

## 2023-07-17 MED ORDER — PSEUDOEPH-BROMPHEN-DM 30-2-10 MG/5ML PO SYRP: 10.0000 mL | ORAL_SOLUTION | Freq: Four times a day (QID) | ORAL | 0 refills | Status: AC | PRN

## 2023-07-17 MED ORDER — PREDNISONE 20 MG PO TABS: 40.0000 mg | ORAL_TABLET | Freq: Every day | ORAL | 0 refills | Status: AC

## 2023-07-17 NOTE — ED Provider Notes (Signed)
 [facility]    CSN: [id number] Arrival date & time: 07/17/23  1634      History   Chief Complaint Chief Complaint  Patient presents with   Cough   Headache   Nasal Congestion    HPI Andrea Pollard is a 68 y.o. female with history of asthma, COPD, diabetes, GERD, hypertension, sleep apnea, and hyperlipidemia.  Today patient is presenting for fatigue, cough, congestion, headaches and bodyaches x 2 days.  Also reports using her asthma inhaler a little more frequently today. Denies fever, ear pain, sinus pain, chest pain, wheezing, abdominal pain, vomiting or diarrhea.    Patient has been exposed to flu. Patient has been taking over-the-counter medication. No other complaints.  HPI  Past Medical History:  Diagnosis Date   Acute pain of left wrist 01/15/2023   Anxiety    Arthritis of left acromioclavicular joint 01/15/2023   Arthritis of left wrist 01/15/2023   Asthma    Complication of anesthesia    COPD (chronic obstructive pulmonary disease) (HCC)    diagnosed from pulmonary function tests   Depression    Diabetes mellitus without complication (HCC)    DVT (deep venous thrombosis) (HCC)    Dvt femoral (deep venous thrombosis) (HCC) 2018   in lung also.  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 had surgery to drain cyst   History of palpitations 2019   due to a panic attack   Hypertension    Lung nodule 2019   being followed by dr. Orlie Dakin, dr Thelma Barge and dr. Belia Heman   Pain in joint of left shoulder 01/15/2023   Pain of left hand 01/15/2023   Pneumonia 04/2016   PONV (postoperative nausea and vomiting)    NAUSEATED   Pulmonary emboli (HCC) 2018   Sleep apnea    CPAP   Tendinitis of left shoulder 01/15/2023   Vertigo      Patient Active Problem List   Diagnosis Date Noted   History of colon polyps 06/07/2023   Obesity (BMI 30-39.9) 09/14/2022   Ataxia 09/14/2022   Polyp of descending colon 05/04/2022   Chronic diarrhea 04/20/2022   Hiatal hernia 04/20/2022   Adenomatous polyp of descending colon 04/20/2022   Anxiety 04/04/2022   Depression with suicidal ideation 04/04/2022   Sleep apnea 04/04/2022   Pain in joint of right shoulder 05/27/2021   Pulmonary embolism (HCC) 03/04/2017   Benign hypertension 10/05/2016   Constipation 10/05/2016   Gastroesophageal reflux disease 10/05/2016   Hypercholesterolemia 10/05/2016   Indigestion 10/05/2016   Obstructive sleep apnea of adult 10/05/2016   Type 2 diabetes mellitus (HCC) 10/05/2016   Vitamin D deficiency 10/05/2016   Coronary artery disease of native artery of native heart with stable angina pectoris (HCC) 09/27/2016   Aortic atherosclerosis (HCC) 09/27/2016   History of smoking 09/27/2016   Lung nodule 06/07/2016   Morbid obesity (HCC) 04/23/2014    Past Surgical History:  Procedure Laterality Date   ABDOMINAL HYSTERECTOMY  1994   CESAREAN SECTION     CHOLECYSTECTOMY  2013   COLONOSCOPY WITH PROPOFOL N/A 04/20/2022   Procedure: COLONOSCOPY WITH PROPOFOL;  Surgeon: Toney Reil, MD;  Location: [facility] ENDOSCOPY;  Service: Gastroenterology;  Laterality: N/A;   COLONOSCOPY  WITH PROPOFOL N/A 06/07/2023   Procedure: COLONOSCOPY WITH PROPOFOL;  Surgeon: Toney Reil, MD;  Location: [facility] ENDOSCOPY;  Service: Gastroenterology;  Laterality: N/A;   CYST REMOVAL NECK  2013   was ingrown hair . positive for mrsa at that time prior to removal in OR   CYSTOSCOPY WITH URETEROSCOPY AND STENT PLACEMENT Right 05/31/2018   Procedure: CYSTOSCOPY WITH URETEROSCOPY, LASER LITHOTRIPSY AND STENT PLACEMENT;  Surgeon: Sondra Come, MD;  Location: [facility] ORS;  Service: Urology;  Laterality: Right;   DIAGNOSTIC LAPAROSCOPY     ELECTROMAGNETIC NAVIGATION  BROCHOSCOPY N/A 08/15/2016   Procedure: ELECTROMAGNETIC NAVIGATION BRONCHOSCOPY;  Surgeon: Erin Fulling, MD;  Location: [facility] ORS;  Service: Cardiopulmonary;  Laterality: N/A;   ELECTROMAGNETIC NAVIGATION BROCHOSCOPY Right 11/07/2017   Procedure: ELECTROMAGNETIC NAVIGATION BRONCHOSCOPY;  Surgeon: Erin Fulling, MD;  Location: [facility] ORS;  Service: Cardiopulmonary;  Laterality: Right;   ESOPHAGOGASTRODUODENOSCOPY (EGD) WITH PROPOFOL N/A 04/20/2022   Procedure: ESOPHAGOGASTRODUODENOSCOPY (EGD) WITH PROPOFOL;  Surgeon: Toney Reil, MD;  Location: [facility] ENDOSCOPY;  Service: Gastroenterology;  Laterality: N/A;   FLEXIBLE SIGMOIDOSCOPY N/A 05/04/2022   Procedure: FLEXIBLE SIGMOIDOSCOPY;  Surgeon: Toney Reil, MD;  Location: [facility] ENDOSCOPY;  Service: Gastroenterology;  Laterality: N/A;   LAPAROSCOPIC GASTRIC SLEEVE RESECTION  2015   lost 75 pounds    OB History   No obstetric history on file.      Home Medications    Prior to Admission medications   Medication Sig Start Date End Date Taking? Authorizing Provider  albuterol (VENTOLIN HFA) 108 (90 Base) MCG/ACT inhaler Inhale 1-2 puffs into the lungs every 6 (six) hours as needed. 03/30/22  Yes Mickie Bail, NP  atorvastatin (LIPITOR) 80 MG tablet Take 80 mg by mouth at bedtime.  08/30/16  Yes [provider]  brompheniramine-pseudoephedrine-DM 30-2-10 MG/5ML syrup Take 10 mLs by mouth 4 (four) times daily as needed for up to 7 days. 07/17/23 07/24/23 Yes Shirlee Latch, PA-C  dicyclomine (BENTYL) 20 MG tablet Take 1 tablet (20 mg total) by mouth 3 (three) times daily before meals. 05/04/23 09/01/23 Yes Celso Amy, PA-C  doxycycline (VIBRAMYCIN) 100 MG capsule Take 1 capsule (100 mg total) by mouth 2 (two) times daily for 5 days. 07/17/23 07/22/23 Yes Shirlee Latch, PA-C  empagliflozin (JARDIANCE) 10 MG TABS tablet Take 10 mg by mouth daily.   Yes [provider]  FLOVENT HFA 220 MCG/ACT inhaler Inhale 2 puffs into the  lungs daily. 12/11/16  Yes [provider]  hydrOXYzine (VISTARIL) 25 MG capsule Take 25-50 mg by mouth 3 (three) times daily as needed.   Yes [provider]  lansoprazole (PREVACID) 15 MG capsule Take 15 mg by mouth at bedtime.    Yes [provider]  lisinopril (ZESTRIL) 10 MG tablet Take 10 mg by mouth daily. 04/28/22  Yes [provider]  LISINOPRIL PO    Yes [provider]  meclizine (ANTIVERT) 25 MG tablet    Yes [provider]  montelukast (SINGULAIR) 10 MG tablet Take 10 mg by mouth daily.   Yes [provider]  ondansetron (ZOFRAN) 4 MG tablet Take 1 tablet (4 mg total) by mouth every 8 (eight) hours as needed for nausea or vomiting. 04/19/22  Yes Vanga, Loel Dubonnet, MD  [facility] VERIO test strip TEST ONCE D 09/21/18  Yes Jeanmarie Plant, MD  OZEMPIC, 0.25 OR 0.5 MG/DOSE, 2 MG/1.5ML SOPN Inject 0.5 mg as directed once a week. 08/19/20  Yes [provider]  PARoxetine (  PAXIL) 30 MG tablet Take 30 mg by mouth at bedtime. 07/03/23  Yes [provider]  predniSONE (DELTASONE) 20 MG tablet Take 2 tablets (40 mg total) by mouth daily for 5 days. 07/17/23 07/22/23 Yes Eusebio Friendly B, PA-C  sertraline (ZOLOFT) 100 MG tablet Take 2 tablets (200 mg total) by mouth daily. 09/15/22  Yes Wouk, Wilfred Curtis, MD  sertraline (ZOLOFT) 50 MG tablet Take 200 mg by mouth daily. 12/12/22  Yes [provider]  traZODone (DESYREL) 150 MG tablet Take 150 mg by mouth at bedtime.   Yes [provider]    Family History Family History  Problem Relation Age of Onset   Stroke Father    Ovarian cancer Maternal Grandmother    Breast cancer Neg Hx     Social History Social History   Tobacco Use   Smoking status: Former    Current packs/day: 0.00    Average packs/day: 1 pack/day for 5.0 years (5.0 ttl pk-yrs)    Types: Cigarettes    Start date: 08/12/1986    Quit date: 08/12/1991    Years since quitting: 31.9    Smokeless tobacco: Never  Vaping Use   Vaping status: Never Used  Substance Use Topics   Alcohol use: No   Drug use: No     Allergies   Levofloxacin, Nitroglycerin, Sulfamethoxazole-trimethoprim, Ibuprofen, and Levaquin [levofloxacin in d5w]   Review of Systems Review of Systems  Constitutional:  Positive for fatigue. Negative for chills, diaphoresis and fever.  HENT:  Positive for congestion and rhinorrhea. Negative for ear pain, sinus pressure, sinus pain and sore throat.   Respiratory:  Positive for cough and shortness of breath.   Cardiovascular:  Negative for chest pain.  Gastrointestinal:  Negative for abdominal pain, nausea and vomiting.  Musculoskeletal:  Negative for arthralgias and myalgias.  Skin:  Negative for rash.  Neurological:  Negative for weakness and headaches.  Hematological:  Negative for adenopathy.     Physical Exam Triage Vital Signs ED Triage Vitals  Encounter Vitals Group     BP 07/17/23 1712 (!) 146/83     Systolic BP Percentile --      Diastolic BP Percentile --      Pulse Rate 07/17/23 1712 75     Resp 07/17/23 1712 16     Temp 07/17/23 1712 98.8 F (37.1 C)     Temp Source 07/17/23 1712 Oral     SpO2 07/17/23 1712 95 %     Weight 07/17/23 1711 190 lb (86.2 kg)     Height 07/17/23 1711 5\' 5"  (1.651 m)     Head Circumference --      Peak Flow --      Pain Score 07/17/23 1717 6     Pain Loc --      Pain Education --      Exclude from Growth Chart --    No data found.  Updated Vital Signs BP (!) 146/83 (BP Location: Left Arm)   Pulse 75   Temp 98.8 F (37.1 C) (Oral)   Resp 16   Ht 5\' 5"  (1.651 m)   Wt 190 lb (86.2 kg)   SpO2 95%   BMI 31.62 kg/m     Physical Exam Vitals and nursing note reviewed.  Constitutional:      General: She is not in acute distress.    Appearance: Normal appearance. She is not ill-appearing or toxic-appearing.  HENT:     Head: Normocephalic and atraumatic.  Nose: Congestion present.      Mouth/Throat:     Mouth: Mucous membranes are moist.     Pharynx: Oropharynx is clear.  Eyes:     General: No scleral icterus.       Right eye: No discharge.        Left eye: No discharge.     Conjunctiva/sclera: Conjunctivae normal.  Cardiovascular:     Rate and Rhythm: Normal rate and regular rhythm.     Heart sounds: Normal heart sounds.  Pulmonary:     Effort: Pulmonary effort is normal. No respiratory distress.     Breath sounds: Rhonchi (scattered rhonchi) present. No wheezing.  Musculoskeletal:     Cervical back: Neck supple.  Skin:    General: Skin is dry.  Neurological:     General: No focal deficit present.     Mental Status: She is alert. Mental status is at baseline.     Motor: No weakness.     Gait: Gait normal.  Psychiatric:        Mood and Affect: Mood normal.        Behavior: Behavior normal.      UC Treatments / Results  Labs (all labs ordered are listed, but only abnormal results are displayed) Labs Reviewed  RESP PANEL BY RT-PCR (FLU A&B, COVID) [id number]    EKG   Radiology DG Chest 2 View Result Date: 07/17/2023 CLINICAL DATA:  Cough, congestion and shortness of breath for the past 2 days. EXAM: CHEST - 2 VIEW COMPARISON:  06/28/2021 FINDINGS: Normal sized heart. Tortuous and partially calcified thoracic aorta. Clear lungs with normal vascularity. Thoracic spine degenerative changes and changes of DISH. IMPRESSION: No active cardiopulmonary disease. Electronically Signed   By: Beckie Salts M.D.   On: 07/17/2023 18:57    Procedures Procedures (including critical care time)  Medications Ordered in UC Medications - No data to display  Initial Impression / Assessment and Plan / UC Course  I have reviewed the triage vital signs and the nursing notes.  Pertinent labs & imaging results that were available during my care of the patient were reviewed by me and considered in my medical decision making (see chart for details).   68 year old female with  history of COPD/asthma presents for 2-day history of fatigue, cough, congestion and shortness of breath.  Has been exposed to influenza by her grandsons.  Blood pressure elevated at 146/83.  Other vitals normal and stable.  She is overall well-appearing.  No acute distress.  On exam has nasal congestion.  Few scattered rhonchi throughout upper lung fields.  Respiratory panel obtained.  All negative.  Chest x-ray obtained.  Negative.  Reviewed result patient.  Advised patient symptoms consistent with viral illness and flareup of underlying COPD/asthma.  Will treat at this time with prednisone, Bromfed-DM and doxycycline.  Advised to use inhaler as needed.  Advised increasing rest and fluids.  Reviewed return and ER precautions.   Final Clinical Impressions(s) / UC Diagnoses   Final diagnoses:  Acute cough  COPD exacerbation (HCC)  Other fatigue  Exposure to the flu     Discharge Instructions      -Negative COVID and flu -No pneumonia seen on x-ray -Sent cough medicine, prednisone and antibiotics to pharmacy.  Use inhaler as needed. - Return if fever, worsening cough or increased breathing trouble.     ED Prescriptions     Medication Sig Dispense Auth. Provider   predniSONE (DELTASONE) 20 MG tablet Take 2 tablets (40 mg  total) by mouth daily for 5 days. 10 tablet Eusebio Friendly B, PA-C   brompheniramine-pseudoephedrine-DM 30-2-10 MG/5ML syrup Take 10 mLs by mouth 4 (four) times daily as needed for up to 7 days. 150 mL Eusebio Friendly B, PA-C   doxycycline (VIBRAMYCIN) 100 MG capsule Take 1 capsule (100 mg total) by mouth 2 (two) times daily for 5 days. 10 capsule Shirlee Latch, PA-C      PDMP not reviewed this encounter.   Shirlee Latch, PA-C 07/17/23 1905

## 2023-07-17 NOTE — Discharge Instructions (Addendum)
-  Negative COVID and flu -No pneumonia seen on x-ray -Sent cough medicine, prednisone and antibiotics to pharmacy.  Use inhaler as needed. - Return if fever, worsening cough or increased breathing trouble.

## 2023-07-17 NOTE — ED Triage Notes (Signed)
 Pt c/o cough,congestion & HA x2 days.

## 2023-07-20 ENCOUNTER — Ambulatory Visit
Admission: EM | Admit: 2023-07-20 | Discharge: 2023-07-20 | Disposition: A | Discharge status: Home / Self Care | Payer: Managed Care, Other (non HMO)

## 2023-07-20 ENCOUNTER — Encounter: Payer: Self-pay | Admitting: Emergency Medicine

## 2023-07-20 DIAGNOSIS — R051 Acute cough: Secondary | ICD-10-CM

## 2023-07-20 DIAGNOSIS — J449 Chronic obstructive pulmonary disease, unspecified: Secondary | ICD-10-CM

## 2023-07-20 NOTE — ED Provider Notes (Signed)
 [facility]    CSN: [id number] Arrival date & time: 07/20/23  1424      History   Chief Complaint Chief Complaint  Patient presents with   Cough    HPI Andrea Pollard is a 68 y.o. female.   68 y.o. female with history of asthma, COPD, diabetes, GERD, hypertension, sleep apnea, and hyperlipidemia.   Today patient is presenting for continued cough, congestion, headaches and bodyaches x 5 days. Pt was seen 3 days prior and treated for COPD exacerbation. Denies fever, ear pain, sinus pain, chest pain, wheezing, abdominal pain, vomiting or diarrhea.      Patient has been taking over-the-counter medication, doxycycline, Bromphed and prednisone,inhaler as prescribed. Pt requesting work note.    The history is provided by the patient and a relative. No language interpreter was used.    Past Medical History:  Diagnosis Date   Acute pain of left wrist 01/15/2023   Anxiety    Arthritis of left acromioclavicular joint 01/15/2023   Arthritis of left wrist 01/15/2023   Asthma    Complication of anesthesia    COPD (chronic obstructive pulmonary disease) (HCC)    diagnosed from pulmonary function tests   Depression    Diabetes mellitus without complication (HCC)    DVT (deep venous thrombosis) (HCC)    Dvt femoral (deep venous thrombosis) (HCC) 2018   in lung also.  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 had surgery to drain cyst   History of palpitations 2019   due to a panic attack   Hypertension    Lung nodule 2019   being followed by dr. Orlie Dakin, dr Thelma Barge and dr. Belia Heman   Pain in joint of left shoulder 01/15/2023   Pain of left hand 01/15/2023   Pneumonia 04/2016   PONV (postoperative nausea and vomiting)    NAUSEATED    Pulmonary emboli (HCC) 2018   Sleep apnea    CPAP   Tendinitis of left shoulder 01/15/2023   Vertigo     Patient Active Problem List   Diagnosis Date Noted   Acute cough 07/20/2023   Chronic obstructive pulmonary disease (HCC) 07/20/2023   History of colon polyps 06/07/2023   Obesity (BMI 30-39.9) 09/14/2022   Ataxia 09/14/2022   Polyp of descending colon 05/04/2022   Chronic diarrhea 04/20/2022   Hiatal hernia 04/20/2022   Adenomatous polyp of descending colon 04/20/2022   Anxiety 04/04/2022   Depression with suicidal ideation 04/04/2022   Sleep apnea 04/04/2022   Pain in joint of right shoulder 05/27/2021   Pulmonary embolism (HCC) 03/04/2017   Benign hypertension 10/05/2016   Constipation 10/05/2016   Gastroesophageal reflux disease 10/05/2016   Hypercholesterolemia 10/05/2016   Indigestion 10/05/2016   Obstructive sleep apnea of adult 10/05/2016   Type 2 diabetes mellitus (HCC) 10/05/2016   Vitamin D deficiency 10/05/2016   Coronary artery disease of native artery of native heart with stable angina pectoris (HCC) 09/27/2016   Aortic atherosclerosis (HCC) 09/27/2016   History of smoking 09/27/2016   Lung nodule 06/07/2016   Morbid obesity (HCC) 04/23/2014    Past Surgical History:  Procedure Laterality Date   ABDOMINAL HYSTERECTOMY  1994   CESAREAN SECTION     CHOLECYSTECTOMY  2013  COLONOSCOPY WITH PROPOFOL N/A 04/20/2022   Procedure: COLONOSCOPY WITH PROPOFOL;  Surgeon: Toney Reil, MD;  Location: [facility] ENDOSCOPY;  Service: Gastroenterology;  Laterality: N/A;   COLONOSCOPY WITH PROPOFOL N/A 06/07/2023   Procedure: COLONOSCOPY WITH PROPOFOL;  Surgeon: Toney Reil, MD;  Location: [facility] ENDOSCOPY;  Service: Gastroenterology;  Laterality: N/A;   CYST REMOVAL NECK  2013   was ingrown hair . positive for mrsa at that time prior to removal in OR   CYSTOSCOPY WITH URETEROSCOPY AND STENT PLACEMENT Right 05/31/2018   Procedure: CYSTOSCOPY WITH URETEROSCOPY,  LASER LITHOTRIPSY AND STENT PLACEMENT;  Surgeon: Sondra Come, MD;  Location: [facility] ORS;  Service: Urology;  Laterality: Right;   DIAGNOSTIC LAPAROSCOPY     ELECTROMAGNETIC NAVIGATION BROCHOSCOPY N/A 08/15/2016   Procedure: ELECTROMAGNETIC NAVIGATION BRONCHOSCOPY;  Surgeon: Erin Fulling, MD;  Location: [facility] ORS;  Service: Cardiopulmonary;  Laterality: N/A;   ELECTROMAGNETIC NAVIGATION BROCHOSCOPY Right 11/07/2017   Procedure: ELECTROMAGNETIC NAVIGATION BRONCHOSCOPY;  Surgeon: Erin Fulling, MD;  Location: [facility] ORS;  Service: Cardiopulmonary;  Laterality: Right;   ESOPHAGOGASTRODUODENOSCOPY (EGD) WITH PROPOFOL N/A 04/20/2022   Procedure: ESOPHAGOGASTRODUODENOSCOPY (EGD) WITH PROPOFOL;  Surgeon: Toney Reil, MD;  Location: [facility] ENDOSCOPY;  Service: Gastroenterology;  Laterality: N/A;   FLEXIBLE SIGMOIDOSCOPY N/A 05/04/2022   Procedure: FLEXIBLE SIGMOIDOSCOPY;  Surgeon: Toney Reil, MD;  Location: [facility] ENDOSCOPY;  Service: Gastroenterology;  Laterality: N/A;   LAPAROSCOPIC GASTRIC SLEEVE RESECTION  2015   lost 75 pounds    OB History   No obstetric history on file.      Home Medications    Prior to Admission medications   Medication Sig Start Date End Date Taking? Authorizing Provider  albuterol (VENTOLIN HFA) 108 (90 Base) MCG/ACT inhaler Inhale 1-2 puffs into the lungs every 6 (six) hours as needed. 03/30/22   Mickie Bail, NP  atorvastatin (LIPITOR) 80 MG tablet Take 80 mg by mouth at bedtime.  08/30/16   [provider]  brompheniramine-pseudoephedrine-DM 30-2-10 MG/5ML syrup Take 10 mLs by mouth 4 (four) times daily as needed for up to 7 days. 07/17/23 07/24/23  Shirlee Latch, PA-C  dicyclomine (BENTYL) 20 MG tablet Take 1 tablet (20 mg total) by mouth 3 (three) times daily before meals. 05/04/23 09/01/23  Celso Amy, PA-C  doxycycline (VIBRAMYCIN) 100 MG capsule Take 1 capsule (100 mg total) by mouth 2 (two) times daily for 5 days. 07/17/23 07/22/23  Eusebio Friendly B, PA-C  empagliflozin (JARDIANCE) 10 MG TABS tablet Take 10 mg by mouth daily.    [provider]  FLOVENT HFA 220 MCG/ACT inhaler Inhale 2 puffs into the lungs daily. 12/11/16   [provider]  hydrOXYzine (VISTARIL) 25 MG capsule Take 25-50 mg by mouth 3 (three) times daily as needed.    [provider]  lansoprazole (PREVACID) 15 MG capsule Take 15 mg by mouth at bedtime.     [provider]  lisinopril (ZESTRIL) 10 MG tablet Take 10 mg by mouth daily. 04/28/22   [provider]  LISINOPRIL PO     [provider]  meclizine (ANTIVERT) 25 MG tablet     [provider]  montelukast (SINGULAIR) 10 MG tablet Take 10 mg by mouth daily.    [provider]  ondansetron (ZOFRAN) 4 MG tablet Take 1 tablet (4 mg total) by mouth every 8 (eight) hours as needed for nausea or vomiting. 04/19/22   Toney Reil, MD  [facility] VERIO test strip TEST ONCE D 09/21/18  Jeanmarie Plant, MD  OZEMPIC, 0.25 OR 0.5 MG/DOSE, 2 MG/1.5ML SOPN Inject 0.5 mg as directed once a week. 08/19/20   [provider]  PARoxetine (PAXIL) 30 MG tablet Take 30 mg by mouth at bedtime. 07/03/23   [provider]  predniSONE (DELTASONE) 20 MG tablet Take 2 tablets (40 mg total) by mouth daily for 5 days. 07/17/23 07/22/23  Eusebio Friendly B, PA-C  sertraline (ZOLOFT) 100 MG tablet Take 2 tablets (200 mg total) by mouth daily. 09/15/22   Wouk, Wilfred Curtis, MD  sertraline (ZOLOFT) 50 MG tablet Take 200 mg by mouth daily. 12/12/22   [provider]  traZODone (DESYREL) 150 MG tablet Take 150 mg by mouth at bedtime.    [provider]    Family History Family History  Problem Relation Age of Onset   Stroke Father    Ovarian cancer Maternal Grandmother    Breast cancer Neg Hx     Social History Social History   Tobacco Use   Smoking status: Former    Current packs/day: 0.00    Average packs/day: 1 pack/day for 5.0  years (5.0 ttl pk-yrs)    Types: Cigarettes    Start date: 08/12/1986    Quit date: 08/12/1991    Years since quitting: 31.9   Smokeless tobacco: Never  Vaping Use   Vaping status: Never Used  Substance Use Topics   Alcohol use: No   Drug use: No     Allergies   Levofloxacin, Nitroglycerin, Sulfamethoxazole-trimethoprim, Ibuprofen, and Levaquin [levofloxacin in d5w]   Review of Systems Review of Systems  Constitutional:  Negative for fever.  HENT:  Positive for congestion.   Respiratory:  Positive for cough.   All other systems reviewed and are negative.    Physical Exam Triage Vital Signs ED Triage Vitals  Encounter Vitals Group     BP 07/20/23 1436 (!) 159/92     Systolic BP Percentile --      Diastolic BP Percentile --      Pulse Rate 07/20/23 1436 81     Resp 07/20/23 1436 14     Temp 07/20/23 1436 98.7 F (37.1 C)     Temp Source 07/20/23 1436 Oral     SpO2 07/20/23 1436 95 %     Weight 07/20/23 1435 190 lb 0.6 oz (86.2 kg)     Height 07/20/23 1435 5\' 5"  (1.651 m)     Head Circumference --      Peak Flow --      Pain Score 07/20/23 1435 6     Pain Loc --      Pain Education --      Exclude from Growth Chart --    No data found.  Updated Vital Signs BP (!) 159/92 (BP Location: Left Arm)   Pulse 81   Temp 98.7 F (37.1 C) (Oral)   Resp 14   Ht 5\' 5"  (1.651 m)   Wt 190 lb 0.6 oz (86.2 kg)   SpO2 95%   BMI 31.62 kg/m   Visual Acuity Right Eye Distance:   Left Eye Distance:   Bilateral Distance:    Right Eye Near:   Left Eye Near:    Bilateral Near:     Physical Exam Vitals and nursing note reviewed.  Constitutional:      General: She is not in acute distress.    Appearance: She is well-developed and well-groomed.  HENT:     Head: Normocephalic and atraumatic.  Eyes:  Conjunctiva/sclera: Conjunctivae normal.  Cardiovascular:     Rate and Rhythm: Normal rate and regular rhythm.     Heart sounds: Normal heart sounds. No murmur  heard. Pulmonary:     Effort: Pulmonary effort is normal. No respiratory distress.     Breath sounds: Normal breath sounds and air entry.  Abdominal:     Palpations: Abdomen is soft.     Tenderness: There is no abdominal tenderness.  Musculoskeletal:        General: No swelling.     Cervical back: Neck supple.  Skin:    General: Skin is warm and dry.     Capillary Refill: Capillary refill takes less than 2 seconds.  Neurological:     General: No focal deficit present.     Mental Status: She is alert and oriented to person, place, and time.     GCS: GCS eye subscore is 4. GCS verbal subscore is 5. GCS motor subscore is 6.  Psychiatric:        Attention and Perception: Attention normal.        Mood and Affect: Mood normal.        Speech: Speech normal.        Behavior: Behavior normal. Behavior is cooperative.      UC Treatments / Results  Labs (all labs ordered are listed, but only abnormal results are displayed) Labs Reviewed - No data to display  EKG   Radiology No results found.  Procedures Procedures (including critical care time)  Medications Ordered in UC Medications - No data to display  Initial Impression / Assessment and Plan / UC Course  I have reviewed the triage vital signs and the nursing notes.  Pertinent labs & imaging results that were available during my care of the patient were reviewed by me and considered in my medical decision making (see chart for details).    Discussed exam findings and plan of care with patient, strict go to ER precautions given.   Patient verbalized understanding to this provider.  Ddx: COPD exacerbation, Viral URI Final Clinical Impressions(s) / UC Diagnoses   Final diagnoses:  Acute cough  Chronic obstructive pulmonary disease, unspecified COPD type (HCC)     Discharge Instructions      Take meds as prescribed,drink plenty of water. Follow up with PCP.   Go to Er for new or worsening issues or concerns.       ED Prescriptions   None    PDMP not reviewed this encounter.   Clancy Gourd, NP 07/20/23 2036

## 2023-07-20 NOTE — ED Triage Notes (Signed)
 Patient was seen here 3 days ago for URI symptoms.  Patient states that she was told to come back here if her symptoms have not improved.  Patient unsure of fevers.

## 2023-07-20 NOTE — Discharge Instructions (Signed)
 Take meds as prescribed,drink plenty of water. Follow up with PCP.   Go to Er for new or worsening issues or concerns.

## 2023-10-29 ENCOUNTER — Other Ambulatory Visit: Payer: Self-pay | Admitting: Family Medicine

## 2023-10-29 DIAGNOSIS — Z1231 Encounter for screening mammogram for malignant neoplasm of breast: Secondary | ICD-10-CM

## 2023-11-06 ENCOUNTER — Ambulatory Visit
Admission: EM | Admit: 2023-11-06 | Discharge: 2023-11-06 | Disposition: A | Discharge status: Home / Self Care | Attending: Physician Assistant | Admitting: Physician Assistant

## 2023-11-06 DIAGNOSIS — R051 Acute cough: Secondary | ICD-10-CM | POA: Insufficient documentation

## 2023-11-06 DIAGNOSIS — J441 Chronic obstructive pulmonary disease with (acute) exacerbation: Secondary | ICD-10-CM | POA: Diagnosis not present

## 2023-11-06 DIAGNOSIS — R062 Wheezing: Secondary | ICD-10-CM | POA: Insufficient documentation

## 2023-11-06 LAB — RESP PANEL BY RT-PCR (FLU A&B, COVID) ARPGX2
Influenza A by PCR: NEGATIVE
Influenza B by PCR: NEGATIVE
SARS Coronavirus 2 by RT PCR: NEGATIVE

## 2023-11-06 MED ORDER — PSEUDOEPH-BROMPHEN-DM 30-2-10 MG/5ML PO SYRP: 10.0000 mL | ORAL_SOLUTION | Freq: Four times a day (QID) | ORAL | 0 refills | Status: AC | PRN

## 2023-11-06 MED ORDER — AMOXICILLIN-POT CLAVULANATE 875-125 MG PO TABS: 1.0000 | ORAL_TABLET | Freq: Two times a day (BID) | ORAL | 0 refills | Status: AC

## 2023-11-06 MED ORDER — PREDNISONE 20 MG PO TABS: 40.0000 mg | ORAL_TABLET | Freq: Every day | ORAL | 0 refills | Status: AC

## 2023-11-06 MED ORDER — ALBUTEROL SULFATE HFA 108 (90 BASE) MCG/ACT IN AERS: 1.0000 | INHALATION_SPRAY | Freq: Four times a day (QID) | RESPIRATORY_TRACT | 0 refills | Status: AC | PRN

## 2023-11-06 MED ORDER — IPRATROPIUM-ALBUTEROL 0.5-2.5 (3) MG/3ML IN SOLN
3.0000 mL | Freq: Once | RESPIRATORY_TRACT | Status: AC
  Administered 2023-11-06: 3 mL via RESPIRATORY_TRACT

## 2023-11-06 NOTE — Discharge Instructions (Addendum)
-  Testing for COVID and flu. Will call if positive. -No concern for pneumonia. The COPD has flared up. -Sent cough medicine, prednisone  and antibiotics to pharmacy.  Use inhaler as needed. - Return if fever, worsening cough or increased breathing trouble.

## 2023-11-06 NOTE — ED Triage Notes (Signed)
 Sx x 3 days  Cough Bilateral ear pain Headache Asthma

## 2023-11-06 NOTE — ED Provider Notes (Signed)
 [facility]    CSN: [id number] Arrival date & time: 11/06/23  1109      History   Chief Complaint Chief Complaint  Patient presents with   Cough   Headache    HPI Andrea Pollard is a 68 y.o. female with history of asthma, COPD, diabetes, GERD, hypertension, sleep apnea, and hyperlipidemia.  Today patient is presenting for fatigue, cough, congestion, headaches and bodyaches x 4 days.  Cough productive of discolored sputum. Also reports using her asthma inhaler a little more frequently today. Denies fever, ear pain, sinus pain, chest pain, abdominal pain, vomiting or diarrhea.    Patient denies sick contacts. Patient has been taking over-the-counter medication. No other complaints.  HPI  Past Medical History:  Diagnosis Date   Acute pain of left wrist 01/15/2023   Anxiety    Arthritis of left acromioclavicular joint 01/15/2023   Arthritis of left wrist 01/15/2023   Asthma    Complication of anesthesia    COPD (chronic obstructive pulmonary disease) (HCC)    diagnosed from pulmonary function tests   Depression    Diabetes mellitus without complication (HCC)    DVT (deep venous thrombosis) (HCC)    Dvt femoral (deep venous thrombosis) (HCC) 2018   in lung also.  treated with xarelto  for 6 months   GERD (gastroesophageal reflux disease)    Hepatitis    AGE 69-HEPATITIS B   History of hiatal hernia    History of kidney stones 2019   History of methicillin resistant staphylococcus aureus (MRSA) 2013   in neck due to ingrown hair. had surgery to drain cyst   History of palpitations 2019   due to a panic attack   Hypertension    Lung nodule 2019   being followed by dr. Adrian Alba, dr Jena Minor and dr. Auston Left   Pain in joint of left shoulder 01/15/2023   Pain of left hand 01/15/2023   Pneumonia 04/2016   PONV (postoperative nausea and vomiting)    NAUSEATED   Pulmonary emboli (HCC) 2018   Sleep apnea    CPAP   Tendinitis of left shoulder 01/15/2023    Vertigo     Patient Active Problem List   Diagnosis Date Noted   Acute cough 07/20/2023   Chronic obstructive pulmonary disease (HCC) 07/20/2023   History of colon polyps 06/07/2023   Obesity (BMI 30-39.9) 09/14/2022   Ataxia 09/14/2022   Polyp of descending colon 05/04/2022   Chronic diarrhea 04/20/2022   Hiatal hernia 04/20/2022   Adenomatous polyp of descending colon 04/20/2022   Anxiety 04/04/2022   Depression with suicidal ideation 04/04/2022   Sleep apnea 04/04/2022   Pain in joint of right shoulder 05/27/2021   Pulmonary embolism (HCC) 03/04/2017   Benign hypertension 10/05/2016   Constipation 10/05/2016   Gastroesophageal reflux disease 10/05/2016   Hypercholesterolemia 10/05/2016   Indigestion 10/05/2016   Obstructive sleep apnea of adult 10/05/2016   Type 2 diabetes mellitus (HCC) 10/05/2016   Vitamin D deficiency 10/05/2016   Coronary artery disease of native artery of native heart with stable angina pectoris (HCC) 09/27/2016   Aortic atherosclerosis (HCC) 09/27/2016   History of smoking 09/27/2016   Lung nodule 06/07/2016   Morbid obesity (HCC) 04/23/2014    Past Surgical History:  Procedure Laterality Date   ABDOMINAL HYSTERECTOMY  1994   CESAREAN SECTION     CHOLECYSTECTOMY  2013   COLONOSCOPY WITH PROPOFOL  N/A 04/20/2022   Procedure: COLONOSCOPY WITH PROPOFOL ;  Surgeon: Selena Daily, MD;  Location:  [facility] ENDOSCOPY;  Service: Gastroenterology;  Laterality: N/A;   COLONOSCOPY WITH PROPOFOL  N/A 06/07/2023   Procedure: COLONOSCOPY WITH PROPOFOL ;  Surgeon: Selena Daily, MD;  Location: [facility] ENDOSCOPY;  Service: Gastroenterology;  Laterality: N/A;   CYST REMOVAL NECK  2013   was ingrown hair . positive for mrsa at that time prior to removal in OR   CYSTOSCOPY WITH URETEROSCOPY AND STENT PLACEMENT Right 05/31/2018   Procedure: CYSTOSCOPY WITH URETEROSCOPY, LASER LITHOTRIPSY AND STENT PLACEMENT;  Surgeon: Lawerence Pressman, MD;  Location: [facility] ORS;   Service: Urology;  Laterality: Right;   DIAGNOSTIC LAPAROSCOPY     ELECTROMAGNETIC NAVIGATION BROCHOSCOPY N/A 08/15/2016   Procedure: ELECTROMAGNETIC NAVIGATION BRONCHOSCOPY;  Surgeon: Cleve Dale, MD;  Location: [facility] ORS;  Service: Cardiopulmonary;  Laterality: N/A;   ELECTROMAGNETIC NAVIGATION BROCHOSCOPY Right 11/07/2017   Procedure: ELECTROMAGNETIC NAVIGATION BRONCHOSCOPY;  Surgeon: Cleve Dale, MD;  Location: [facility] ORS;  Service: Cardiopulmonary;  Laterality: Right;   ESOPHAGOGASTRODUODENOSCOPY (EGD) WITH PROPOFOL  N/A 04/20/2022   Procedure: ESOPHAGOGASTRODUODENOSCOPY (EGD) WITH PROPOFOL ;  Surgeon: Selena Daily, MD;  Location: [facility] ENDOSCOPY;  Service: Gastroenterology;  Laterality: N/A;   FLEXIBLE SIGMOIDOSCOPY N/A 05/04/2022   Procedure: FLEXIBLE SIGMOIDOSCOPY;  Surgeon: Selena Daily, MD;  Location: [facility] ENDOSCOPY;  Service: Gastroenterology;  Laterality: N/A;   LAPAROSCOPIC GASTRIC SLEEVE RESECTION  2015   lost 75 pounds    OB History   No obstetric history on file.      Home Medications    Prior to Admission medications   Medication Sig Start Date End Date Taking? Authorizing Provider  albuterol  (VENTOLIN  HFA) 108 (90 Base) MCG/ACT inhaler Inhale 1-2 puffs into the lungs every 6 (six) hours as needed. 03/30/22  Yes Wellington Half, NP  albuterol  (VENTOLIN  HFA) 108 (90 Base) MCG/ACT inhaler Inhale 1-2 puffs into the lungs every 6 (six) hours as needed for wheezing or shortness of breath. 11/06/23  Yes Nancy Axon B, PA-C  atorvastatin (LIPITOR) 80 MG tablet Take 80 mg by mouth at bedtime.  08/30/16  Yes [provider]  empagliflozin (JARDIANCE) 10 MG TABS tablet Take 10 mg by mouth daily.   Yes [provider]  FLOVENT HFA 220 MCG/ACT inhaler Inhale 2 puffs into the lungs daily. 12/11/16  Yes [provider]  hydrOXYzine (VISTARIL) 25 MG capsule Take 25-50 mg by mouth 3 (three) times daily as needed.   Yes [provider]   lansoprazole (PREVACID) 15 MG capsule Take 15 mg by mouth at bedtime.    Yes [provider]  lisinopril (ZESTRIL) 10 MG tablet Take 10 mg by mouth daily. 04/28/22  Yes [provider]  LISINOPRIL PO    Yes [provider]  montelukast (SINGULAIR) 10 MG tablet Take 10 mg by mouth daily.   Yes [provider]  [facility] VERIO test strip TEST ONCE D 09/21/18  Yes McShane, Pleas Brill, MD  OZEMPIC, 0.25 OR 0.5 MG/DOSE, 2 MG/1.5ML SOPN Inject 0.5 mg as directed once a week. 08/19/20  Yes [provider]  PARoxetine (PAXIL) 30 MG tablet Take 30 mg by mouth at bedtime. 07/03/23  Yes [provider]  sertraline  (ZOLOFT ) 100 MG tablet Take 2 tablets (200 mg total) by mouth daily. 09/15/22  Yes Wouk, Haynes Lips, MD  sertraline  (ZOLOFT ) 50 MG tablet Take 200 mg by mouth daily. 12/12/22  Yes [provider]  traZODone (DESYREL) 150 MG tablet Take 150 mg by mouth at bedtime.   Yes [provider]  amoxicillin -clavulanate (AUGMENTIN) 875-125 MG  tablet Take 1 tablet by mouth every 12 (twelve) hours for 7 days. 11/06/23 11/13/23 Yes Nancy Axon B, PA-C  brompheniramine-pseudoephedrine-DM 30-2-10 MG/5ML syrup Take 10 mLs by mouth 4 (four) times daily as needed for up to 7 days. 11/06/23 11/13/23 Yes Floydene Hy, PA-C  dicyclomine  (BENTYL ) 20 MG tablet Take 1 tablet (20 mg total) by mouth 3 (three) times daily before meals. 05/04/23 09/01/23  Brigitte Canard, PA-C  meclizine  (ANTIVERT ) 25 MG tablet     [provider]  ondansetron  (ZOFRAN ) 4 MG tablet Take 1 tablet (4 mg total) by mouth every 8 (eight) hours as needed for nausea or vomiting. 04/19/22   Selena Daily, MD  predniSONE  (DELTASONE ) 20 MG tablet Take 2 tablets (40 mg total) by mouth daily for 5 days. 11/06/23 11/11/23 Yes Floydene Hy, PA-C    Family History Family History  Problem Relation Age of Onset   Stroke Father    Ovarian cancer Maternal Grandmother    Breast  cancer Neg Hx     Social History Social History   Tobacco Use   Smoking status: Former    Current packs/day: 0.00    Average packs/day: 1 pack/day for 5.0 years (5.0 ttl pk-yrs)    Types: Cigarettes    Start date: 08/12/1986    Quit date: 08/12/1991    Years since quitting: 32.2   Smokeless tobacco: Never  Vaping Use   Vaping status: Never Used  Substance Use Topics   Alcohol use: No   Drug use: No     Allergies   Levofloxacin, Nitroglycerin, Sulfamethoxazole-trimethoprim, Ibuprofen, Levaquin [levofloxacin in d5w], and Metformin   Review of Systems Review of Systems  Constitutional:  Positive for fatigue. Negative for chills, diaphoresis and fever.  HENT:  Positive for congestion and rhinorrhea. Negative for ear pain, sinus pressure, sinus pain and sore throat.   Respiratory:  Positive for cough, shortness of breath and wheezing.   Cardiovascular:  Negative for chest pain.  Gastrointestinal:  Negative for abdominal pain, nausea and vomiting.  Musculoskeletal:  Negative for arthralgias and myalgias.  Skin:  Negative for rash.  Neurological:  Negative for weakness and headaches.  Hematological:  Negative for adenopathy.     Physical Exam Triage Vital Signs ED Triage Vitals  Encounter Vitals Group     BP 07/17/23 1712 (!) 146/83     Systolic BP Percentile --      Diastolic BP Percentile --      Pulse Rate 07/17/23 1712 75     Resp 07/17/23 1712 16     Temp 07/17/23 1712 98.8 F (37.1 C)     Temp Source 07/17/23 1712 Oral     SpO2 07/17/23 1712 95 %     Weight 07/17/23 1711 190 lb (86.2 kg)     Height 07/17/23 1711 5' 5 (1.651 m)     Head Circumference --      Peak Flow --      Pain Score 07/17/23 1717 6     Pain Loc --      Pain Education --      Exclude from Growth Chart --    No data found.  Updated Vital Signs BP (!) 150/84 (BP Location: Right Arm)   Pulse 75   Temp 98.5 F (36.9 C) (Oral)   Resp 19   SpO2 95%     Physical Exam Vitals and  nursing note reviewed.  Constitutional:      General: She is not in acute  distress.    Appearance: Normal appearance. She is not ill-appearing or toxic-appearing.  HENT:     Head: Normocephalic and atraumatic.     Nose: Congestion present.     Mouth/Throat:     Mouth: Mucous membranes are moist.     Pharynx: Oropharynx is clear.   Eyes:     General: No scleral icterus.       Right eye: No discharge.        Left eye: No discharge.     Conjunctiva/sclera: Conjunctivae normal.    Cardiovascular:     Rate and Rhythm: Normal rate and regular rhythm.     Heart sounds: Normal heart sounds.  Pulmonary:     Effort: Pulmonary effort is normal. No respiratory distress.     Breath sounds: Rhonchi (scattered rhonchi) present. No wheezing.   Musculoskeletal:     Cervical back: Neck supple.   Skin:    General: Skin is dry.   Neurological:     General: No focal deficit present.     Mental Status: She is alert. Mental status is at baseline.     Motor: No weakness.     Gait: Gait normal.   Psychiatric:        Mood and Affect: Mood normal.        Behavior: Behavior normal.      UC Treatments / Results  Labs (all labs ordered are listed, but only abnormal results are displayed) Labs Reviewed  RESP PANEL BY RT-PCR (FLU A&B, COVID) [id number]    EKG   Radiology No results found.   Procedures Procedures (including critical care time)  Medications Ordered in UC Medications  ipratropium-albuterol  (DUONEB) 0.5-2.5 (3) MG/3ML nebulizer solution 3 mL (3 mLs Nebulization Given 11/06/23 1235)    Initial Impression / Assessment and Plan / UC Course  I have reviewed the triage vital signs and the nursing notes.  Pertinent labs & imaging results that were available during my care of the patient were reviewed by me and considered in my medical decision making (see chart for details).   68 year old female with history of COPD/asthma presents for 4-day history of fatigue, productive  cough, congestion and shortness of breath.   Blood pressure elevated at 150/84.  Other vitals normal and stable.  She is overall well-appearing.  No acute distress.  On exam has nasal congestion.  Few scattered rhonchi throughout upper lung fields.  Respiratory panel obtained.  All negative.  Patient given duoneb treatment.  Advised patient symptoms consistent with viral illness and flareup of underlying COPD/asthma.  Will treat at this time with prednisone , Bromfed-DM and Augmentin.  Advised to use inhaler as needed. Refilled inhaler.  Advised increasing rest and fluids.  Reviewed return and ER precautions.   Final Clinical Impressions(s) / UC Diagnoses   Final diagnoses:  COPD exacerbation (HCC)  Acute cough  Wheezing     Discharge Instructions      -Testing for COVID and flu. Will call if positive. -No concern for pneumonia. The COPD has flared up. -Sent cough medicine, prednisone  and antibiotics to pharmacy.  Use inhaler as needed. - Return if fever, worsening cough or increased breathing trouble.      ED Prescriptions     Medication Sig Dispense Auth. Provider   brompheniramine-pseudoephedrine-DM 30-2-10 MG/5ML syrup Take 10 mLs by mouth 4 (four) times daily as needed for up to 7 days. 150 mL Nancy Axon B, PA-C   predniSONE  (DELTASONE ) 20 MG tablet Take 2 tablets (40 mg total)  by mouth daily for 5 days. 10 tablet Floydene Hy, PA-C   amoxicillin -clavulanate (AUGMENTIN) 875-125 MG tablet Take 1 tablet by mouth every 12 (twelve) hours for 7 days. 14 tablet Nancy Axon B, PA-C   albuterol  (VENTOLIN  HFA) 108 (90 Base) MCG/ACT inhaler Inhale 1-2 puffs into the lungs every 6 (six) hours as needed for wheezing or shortness of breath. 1 each Andrea Pollard      PDMP not reviewed this encounter.   Floydene Hy, PA-C 07/17/23 1905    Floydene Hy, PA-C 11/06/23 1256

## 2023-11-17 ENCOUNTER — Ambulatory Visit
Admission: EM | Admit: 2023-11-17 | Discharge: 2023-11-17 | Disposition: A | Discharge status: Home / Self Care | Attending: Emergency Medicine | Admitting: Emergency Medicine

## 2023-11-17 DIAGNOSIS — R309 Painful micturition, unspecified: Secondary | ICD-10-CM | POA: Insufficient documentation

## 2023-11-17 DIAGNOSIS — Z7985 Long-term (current) use of injectable non-insulin antidiabetic drugs: Secondary | ICD-10-CM | POA: Insufficient documentation

## 2023-11-17 DIAGNOSIS — Z86718 Personal history of other venous thrombosis and embolism: Secondary | ICD-10-CM | POA: Diagnosis not present

## 2023-11-17 DIAGNOSIS — Z7984 Long term (current) use of oral hypoglycemic drugs: Secondary | ICD-10-CM | POA: Insufficient documentation

## 2023-11-17 DIAGNOSIS — Z87891 Personal history of nicotine dependence: Secondary | ICD-10-CM | POA: Diagnosis not present

## 2023-11-17 DIAGNOSIS — N3000 Acute cystitis without hematuria: Secondary | ICD-10-CM | POA: Diagnosis not present

## 2023-11-17 DIAGNOSIS — B3731 Acute candidiasis of vulva and vagina: Secondary | ICD-10-CM | POA: Diagnosis not present

## 2023-11-17 DIAGNOSIS — T148XXA Other injury of unspecified body region, initial encounter: Secondary | ICD-10-CM | POA: Insufficient documentation

## 2023-11-17 DIAGNOSIS — N9089 Other specified noninflammatory disorders of vulva and perineum: Secondary | ICD-10-CM | POA: Insufficient documentation

## 2023-11-17 LAB — URINALYSIS, W/ REFLEX TO CULTURE (INFECTION SUSPECTED)
Glucose, UA: 500 mg/dL — AB
Ketones, ur: NEGATIVE mg/dL
Leukocytes,Ua: NEGATIVE
Nitrite: NEGATIVE
Protein, ur: 30 mg/dL — AB
Specific Gravity, Urine: >1.03 — ABNORMAL HIGH (ref 1.005–1.030)
pH: 5.5 (ref 5.0–8.0)

## 2023-11-17 MED ORDER — FLUCONAZOLE 150 MG PO TABS: 150.0000 mg | ORAL_TABLET | ORAL | 0 refills | Status: AC

## 2023-11-17 MED ORDER — NITROFURANTOIN MONOHYD MACRO 100 MG PO CAPS: 100.0000 mg | ORAL_CAPSULE | Freq: Two times a day (BID) | ORAL | 0 refills | Status: DC

## 2023-11-17 MED ORDER — PHENAZOPYRIDINE HCL 200 MG PO TABS: 200.0000 mg | ORAL_TABLET | Freq: Three times a day (TID) | ORAL | 0 refills | Status: DC

## 2023-11-17 NOTE — Discharge Instructions (Addendum)
 Your urinalysis shows that you have white cells in your urine there is no leukocyte esterase or nitrites.  You also have a large amount of sugar in your urine, which is to be expected given that you are on Jardiance.  Under the microscope they also saw budding yeast which is suggestive that you have a urinary tract infection and a vaginal yeast infection.  Take the Macrobid  twice daily for 5 days with food for treatment of urinary tract infection.  Use the Pyridium every 8 hours as needed for urinary discomfort.  This will turn your urine a bright red-orange.  Increase your oral fluid intake so that you increase your urine production and or flushing your urinary system.  Take an over-the-counter probiotic, such as Culturelle-Align-Activia, 1 hour after each dose of antibiotic to prevent diarrhea or yeast infections from forming.  We will culture urine and change the antibiotics if necessary.  For the yeast infection I want you to take Diflucan  150 mg tablets, 1 tablet now and repeat dosing every 3 days for total of 3 doses.  Your vaginal swab will be back in next 1 to 2 days and will let us  know if there is also a bacterial vaginosis which is contributing to your symptoms.  Given that you are experiencing bladder leakage I would recommend that you wear a pad to collect the leakage in your underwear and keep it off of your skin.  I would also recommend applying a barrier emollient to help protect your skin from any further irritation from the uric acid in your urine.  I would recommend using bag balm, a medicated lanolin ointment that you can find at [facility].  Apply a thin smear over all the areas of redness to protect it for any further irritation.  If you develop any increased redness or swelling of your vulva or perineum, increased pain, or if you start running fevers I am going to recommend that she go to the ER for evaluation.

## 2023-11-17 NOTE — ED Provider Notes (Signed)
 [facility]    CSN: [id number] Arrival date & time: 11/17/23  1219      History   Chief Complaint Chief Complaint  Patient presents with   vaginal problem    HPI Andrea Pollard is a 68 y.o. female.   HPI  68 year old female with past medical history significant for hypertension, COPD, arthritis, GERD, diabetes, hepatitis, DVT, PE, and sleep apnea presents for evaluation of genitourinary complaints that been going on for the last 3 weeks.  She reports that her vagina and her perineum are red and swollen and she has been experiencing itching in these areas.  She also endorses some burning with urination but she thinks is because her urine is coming in contact with the raw skin.  She has had some incontinence as well as some increased urinary frequency but she denies any urgency.  She denies any vaginal discharge or blood in her urine.  Her PCP has been treating her for a yeast infection for 3 weeks without resolution of symptoms.  Past Medical History:  Diagnosis Date   Acute pain of left wrist 01/15/2023   Anxiety    Arthritis of left acromioclavicular joint 01/15/2023   Arthritis of left wrist 01/15/2023   Asthma    Complication of anesthesia    COPD (chronic obstructive pulmonary disease) (HCC)    diagnosed from pulmonary function tests   Depression    Diabetes mellitus without complication (HCC)    DVT (deep venous thrombosis) (HCC)    Dvt femoral (deep venous thrombosis) (HCC) 2018   in lung also.  treated with xarelto  for 6 months   GERD (gastroesophageal reflux disease)    Hepatitis    AGE 54-HEPATITIS B   History of hiatal hernia    History of kidney stones 2019   History of methicillin resistant staphylococcus aureus (MRSA) 2013   in neck due to ingrown hair. had surgery to drain cyst   History of palpitations 2019   due to a panic attack   Hypertension    Lung nodule 2019   being followed by dr. jacobo, dr volney and dr. isaiah   Pain in joint of  left shoulder 01/15/2023   Pain of left hand 01/15/2023   Pneumonia 04/2016   PONV (postoperative nausea and vomiting)    NAUSEATED   Pulmonary emboli (HCC) 2018   Sleep apnea    CPAP   Tendinitis of left shoulder 01/15/2023   Vertigo     Patient Active Problem List   Diagnosis Date Noted   Acute cough 07/20/2023   Chronic obstructive pulmonary disease (HCC) 07/20/2023   History of colon polyps 06/07/2023   Obesity (BMI 30-39.9) 09/14/2022   Ataxia 09/14/2022   Polyp of descending colon 05/04/2022   Chronic diarrhea 04/20/2022   Hiatal hernia 04/20/2022   Adenomatous polyp of descending colon 04/20/2022   Anxiety 04/04/2022   Depression with suicidal ideation 04/04/2022   Sleep apnea 04/04/2022   Pain in joint of right shoulder 05/27/2021   Pulmonary embolism (HCC) 03/04/2017   Benign hypertension 10/05/2016   Constipation 10/05/2016   Gastroesophageal reflux disease 10/05/2016   Hypercholesterolemia 10/05/2016   Indigestion 10/05/2016   Obstructive sleep apnea of adult 10/05/2016   Type 2 diabetes mellitus (HCC) 10/05/2016   Vitamin D deficiency 10/05/2016   Coronary artery disease of native artery of native heart with stable angina pectoris (HCC) 09/27/2016   Aortic atherosclerosis (HCC) 09/27/2016   History of smoking 09/27/2016   Lung nodule 06/07/2016  Morbid obesity (HCC) 04/23/2014    Past Surgical History:  Procedure Laterality Date   ABDOMINAL HYSTERECTOMY  1994   CESAREAN SECTION     CHOLECYSTECTOMY  2013   COLONOSCOPY WITH PROPOFOL  N/A 04/20/2022   Procedure: COLONOSCOPY WITH PROPOFOL ;  Surgeon: Unk Corinn Skiff, MD;  Location: [facility] ENDOSCOPY;  Service: Gastroenterology;  Laterality: N/A;   COLONOSCOPY WITH PROPOFOL  N/A 06/07/2023   Procedure: COLONOSCOPY WITH PROPOFOL ;  Surgeon: Unk Corinn Skiff, MD;  Location: [facility] ENDOSCOPY;  Service: Gastroenterology;  Laterality: N/A;   CYST REMOVAL NECK  2013   was ingrown hair . positive for mrsa at that  time prior to removal in OR   CYSTOSCOPY WITH URETEROSCOPY AND STENT PLACEMENT Right 05/31/2018   Procedure: CYSTOSCOPY WITH URETEROSCOPY, LASER LITHOTRIPSY AND STENT PLACEMENT;  Surgeon: Francisca Redell BROCKS, MD;  Location: [facility] ORS;  Service: Urology;  Laterality: Right;   DIAGNOSTIC LAPAROSCOPY     ELECTROMAGNETIC NAVIGATION BROCHOSCOPY N/A 08/15/2016   Procedure: ELECTROMAGNETIC NAVIGATION BRONCHOSCOPY;  Surgeon: Nickolas Cellar, MD;  Location: [facility] ORS;  Service: Cardiopulmonary;  Laterality: N/A;   ELECTROMAGNETIC NAVIGATION BROCHOSCOPY Right 11/07/2017   Procedure: ELECTROMAGNETIC NAVIGATION BRONCHOSCOPY;  Surgeon: Cellar Nickolas, MD;  Location: [facility] ORS;  Service: Cardiopulmonary;  Laterality: Right;   ESOPHAGOGASTRODUODENOSCOPY (EGD) WITH PROPOFOL  N/A 04/20/2022   Procedure: ESOPHAGOGASTRODUODENOSCOPY (EGD) WITH PROPOFOL ;  Surgeon: Unk Corinn Skiff, MD;  Location: [facility] ENDOSCOPY;  Service: Gastroenterology;  Laterality: N/A;   FLEXIBLE SIGMOIDOSCOPY N/A 05/04/2022   Procedure: FLEXIBLE SIGMOIDOSCOPY;  Surgeon: Unk Corinn Skiff, MD;  Location: [facility] ENDOSCOPY;  Service: Gastroenterology;  Laterality: N/A;   LAPAROSCOPIC GASTRIC SLEEVE RESECTION  2015   lost 75 pounds    OB History   No obstetric history on file.      Home Medications    Prior to Admission medications   Medication Sig Start Date End Date Taking? Authorizing Provider  albuterol  (VENTOLIN  HFA) 108 (90 Base) MCG/ACT inhaler Inhale 1-2 puffs into the lungs every 6 (six) hours as needed. 03/30/22  Yes Corlis Burnard DEL, NP  albuterol  (VENTOLIN  HFA) 108 (90 Base) MCG/ACT inhaler Inhale 1-2 puffs into the lungs every 6 (six) hours as needed for wheezing or shortness of breath. 11/06/23  Yes Arvis Huxley B, PA-C  atorvastatin (LIPITOR) 80 MG tablet Take 80 mg by mouth at bedtime.  08/30/16  Yes [provider]  empagliflozin (JARDIANCE) 10 MG TABS tablet Take 10 mg by mouth daily.   Yes [provider]  FLOVENT  HFA 220 MCG/ACT inhaler Inhale 2 puffs into the lungs daily. 12/11/16  Yes [provider]  fluconazole  (DIFLUCAN ) 150 MG tablet Take 1 tablet (150 mg total) by mouth every 3 (three) days for 3 doses. 11/17/23 11/24/23 Yes Bernardino Ditch, NP  lansoprazole (PREVACID) 15 MG capsule Take 15 mg by mouth at bedtime.    Yes [provider]  lisinopril (ZESTRIL) 10 MG tablet Take 10 mg by mouth daily. 04/28/22  Yes [provider]  LISINOPRIL PO    Yes [provider]  meclizine  (ANTIVERT ) 25 MG tablet    Yes [provider]  montelukast (SINGULAIR) 10 MG tablet Take 10 mg by mouth daily.   Yes [provider]  nitrofurantoin , macrocrystal-monohydrate, (MACROBID ) 100 MG capsule Take 1 capsule (100 mg total) by mouth 2 (two) times daily. 11/17/23  Yes Bernardino Ditch, NP  ondansetron  (ZOFRAN ) 4 MG tablet Take 1 tablet (4 mg total) by mouth every 8 (eight) hours as needed for nausea or vomiting. 04/19/22  Yes  Unk Corinn Skiff, MD  [facility] VERIO test strip TEST ONCE D 09/21/18  Yes Harlee Lynwood LABOR, MD  OZEMPIC, 0.25 OR 0.5 MG/DOSE, 2 MG/1.5ML SOPN Inject 0.5 mg as directed once a week. 08/19/20  Yes [provider]  PARoxetine (PAXIL) 30 MG tablet Take 30 mg by mouth at bedtime. 07/03/23  Yes [provider]  phenazopyridine (PYRIDIUM) 200 MG tablet Take 1 tablet (200 mg total) by mouth 3 (three) times daily. 11/17/23  Yes Bernardino Ditch, NP  REXULTI 1 MG TABS tablet Take 1 mg by mouth daily. 11/12/23  Yes [provider]  sertraline  (ZOLOFT ) 100 MG tablet Take 2 tablets (200 mg total) by mouth daily. 09/15/22  Yes Wouk, Devaughn Sayres, MD  sertraline  (ZOLOFT ) 50 MG tablet Take 200 mg by mouth daily. 12/12/22  Yes [provider]  traZODone (DESYREL) 150 MG tablet Take 150 mg by mouth at bedtime.   Yes [provider]  dicyclomine  (BENTYL ) 20 MG tablet Take 1 tablet (20 mg total) by mouth 3 (three) times daily before meals.  05/04/23 09/01/23  [place], PA-C  hydrOXYzine (VISTARIL) 25 MG capsule Take 25-50 mg by mouth 3 (three) times daily as needed.    [provider]    Family History Family History  Problem Relation Age of Onset   Stroke Father    Ovarian cancer Maternal Grandmother    Breast cancer Neg Hx     Social History Social History   Tobacco Use   Smoking status: Former    Current packs/day: 0.00    Average packs/day: 1 pack/day for 5.0 years (5.0 ttl pk-yrs)    Types: Cigarettes    Start date: 08/12/1986    Quit date: 08/12/1991    Years since quitting: 32.2   Smokeless tobacco: Never  Vaping Use   Vaping status: Never Used  Substance Use Topics   Alcohol use: No   Drug use: No     Allergies   Levofloxacin, Nitroglycerin, Sulfamethoxazole-trimethoprim, Ibuprofen, Levaquin [levofloxacin in d5w], and Metformin   Review of Systems Review of Systems  Constitutional:  Negative for fever.  Genitourinary:  Positive for dysuria, frequency and vaginal pain. Negative for hematuria, urgency, vaginal bleeding and vaginal discharge.     Physical Exam Triage Vital Signs ED Triage Vitals  Encounter Vitals Group     BP      Girls Systolic BP Percentile      Girls Diastolic BP Percentile      Boys Systolic BP Percentile      Boys Diastolic BP Percentile      Pulse      Resp      Temp      Temp src      SpO2      Weight      Height      Head Circumference      Peak Flow      Pain Score      Pain Loc      Pain Education      Exclude from Growth Chart    No data found.  Updated Vital Signs BP 136/87 (BP Location: Right Arm)   Pulse 68   Temp 98 F (36.7 C) (Oral)   Resp 17   SpO2 96%   Visual Acuity Right Eye Distance:   Left Eye Distance:   Bilateral Distance:    Right Eye Near:   Left Eye Near:    Bilateral Near:     Physical Exam Vitals  and nursing note reviewed. Exam conducted with a chaperone present Gwynn Kitty, CMA).  Constitutional:       Appearance: Normal appearance. She is not ill-appearing.  HENT:     Head: Normocephalic and atraumatic.  Genitourinary:    Vagina: Vaginal discharge present.     Comments: Patient's vulva and perineum are erythematous and excoriated.  Patient has visible urine leakage from her urethral meatus.  There is yellow-green discharge coming from the patient's vaginal vault.  Skin:    General: Skin is warm and dry.     Capillary Refill: Capillary refill takes less than 2 seconds.   Neurological:     General: No focal deficit present.     Mental Status: She is alert and oriented to person, place, and time.      UC Treatments / Results  Labs (all labs ordered are listed, but only abnormal results are displayed) Labs Reviewed  URINALYSIS, W/ REFLEX TO CULTURE (INFECTION SUSPECTED) - Abnormal; Notable for the following components:      Result Value   Specific Gravity, Urine >1.030 (*)    Glucose, UA 500 (*)    Hgb urine dipstick TRACE (*)    Bilirubin Urine SMALL (*)    Protein, ur 30 (*)    Bacteria, UA FEW (*)    All other components within normal limits  URINE CULTURE  CERVICOVAGINAL ANCILLARY ONLY    EKG   Radiology No results found.  Procedures Procedures (including critical care time)  Medications Ordered in UC Medications - No data to display  Initial Impression / Assessment and Plan / UC Course  I have reviewed the triage vital signs and the nursing notes.  Pertinent labs & imaging results that were available during my care of the patient were reviewed by me and considered in my medical decision making (see chart for details).   Patient is a pleasant 68 year old female who does appear to be in a moderate degree of pain presenting for evaluation of redness, swelling, pain, and itching to her vulva and perineum that has been going on for the last 3 weeks.  She is also experiencing urinary incontinence along with dysuria and increased frequency but no urgency.  She  denies any vaginal discharge or bleeding.  With Annabella Kitty, CMA as a chaperone I performed a visual inspection of the patient's perineum which revealed erythema and excoriation of the patient's vulva as well as marked erythema of her labia minora.  There is a yellow-green discharge present in her vaginal vault as well as back along her perineum extending back almost to her rectal opening.  I collected a vaginal cytology swab to assess for BV or yeast.  She has been using Balmex to help with the irritation as well as has been taking fluconazole  and topical yeast infection creams for presumptive yeast infection without improvement of symptoms.  Given she has had incontinence I will order a urinalysis to assess for possible UTI.  Some of the excoriation I do believe is secondary from the urinary incontinence.  Urinalysis shows a high specific gravity of >1.030, 500 glucose, trace hemoglobin, small bilirubin, 30 protein.  Negative for leukocyte esterase, nitrates, or ketones.  Reflex microscopy shows 6-10 WBCs with few bacteria and budding yeast present.  The elevated glucose in the urine is to be expected given the fact the patient is on Jardiance.  I will discharge patient home on Diflucan  150 mg and have her take 1 tablet now and repeat doses every 3 days  for total of 3 doses.  I will also have her apply bag balm to her vulva and perineal area to protect the skin from her urine which I think is contributing to the excoriation.  The vaginal cytology swab is pending and should be back in the next 1 to 2 days.  However, if the patient has any increase in her pain, swelling of her perineal area, or develops fever she needs to go to the ER for evaluation.  I will also treat the patient for UTI given the 6-10 WBCs seen in her urine.   Final Clinical Impressions(s) / UC Diagnoses   Final diagnoses:  Skin excoriation  Vulvar irritation  Vaginal yeast infection  Acute cystitis without hematuria      Discharge Instructions      Your urinalysis shows that you have white cells in your urine there is no leukocyte esterase or nitrites.  You also have a large amount of sugar in your urine, which is to be expected given that you are on Jardiance.  Under the microscope they also saw budding yeast which is suggestive that you have a urinary tract infection and a vaginal yeast infection.  Take the Macrobid  twice daily for 5 days with food for treatment of urinary tract infection.  Use the Pyridium every 8 hours as needed for urinary discomfort.  This will turn your urine a bright red-orange.  Increase your oral fluid intake so that you increase your urine production and or flushing your urinary system.  Take an over-the-counter probiotic, such as Culturelle-Align-Activia, 1 hour after each dose of antibiotic to prevent diarrhea or yeast infections from forming.  We will culture urine and change the antibiotics if necessary.  For the yeast infection I want you to take Diflucan  150 mg tablets, 1 tablet now and repeat dosing every 3 days for total of 3 doses.  Your vaginal swab will be back in next 1 to 2 days and will let us  know if there is also a bacterial vaginosis which is contributing to your symptoms.  Given that you are experiencing bladder leakage I would recommend that you wear a pad to collect the leakage in your underwear and keep it off of your skin.  I would also recommend applying a barrier emollient to help protect your skin from any further irritation from the uric acid in your urine.  I would recommend using bag balm, a medicated lanolin ointment that you can find at [facility].  Apply a thin smear over all the areas of redness to protect it for any further irritation.  If you develop any increased redness or swelling of your vulva or perineum, increased pain, or if you start running fevers I am going to recommend that she go to the ER for evaluation.     ED Prescriptions      Medication Sig Dispense Auth. Provider   fluconazole  (DIFLUCAN ) 150 MG tablet Take 1 tablet (150 mg total) by mouth every 3 (three) days for 3 doses. 3 tablet Bernardino Ditch, NP   nitrofurantoin , macrocrystal-monohydrate, (MACROBID ) 100 MG capsule Take 1 capsule (100 mg total) by mouth 2 (two) times daily. 10 capsule Bernardino Ditch, NP   phenazopyridine (PYRIDIUM) 200 MG tablet Take 1 tablet (200 mg total) by mouth 3 (three) times daily. 6 tablet Bernardino Ditch, NP      PDMP not reviewed this encounter.   Bernardino Ditch, NP 11/17/23 1341

## 2023-11-17 NOTE — ED Triage Notes (Signed)
 Patient states that her PCP has been treating her for a yeast infection for 3 weeks. She's been taking fluconazole  a total of 5 doses. She also feels like she has a bump near her anus. Vagina is raw and swollen.

## 2023-11-19 ENCOUNTER — Ambulatory Visit (HOSPITAL_COMMUNITY): Payer: Self-pay

## 2023-11-19 LAB — URINE CULTURE
Culture: 60000 — AB
Special Requests: NORMAL

## 2023-11-20 LAB — CERVICOVAGINAL ANCILLARY ONLY
Bacterial Vaginitis (gardnerella): NEGATIVE
Candida Glabrata: NEGATIVE
Candida Vaginitis: POSITIVE — AB
Comment: NEGATIVE
Comment: NEGATIVE
Comment: NEGATIVE

## 2023-12-02 ENCOUNTER — Ambulatory Visit
Admission: EM | Admit: 2023-12-02 | Discharge: 2023-12-02 | Disposition: A | Discharge status: Home / Self Care | Attending: Family Medicine | Admitting: Family Medicine

## 2023-12-02 DIAGNOSIS — N3001 Acute cystitis with hematuria: Secondary | ICD-10-CM | POA: Diagnosis present

## 2023-12-02 LAB — URINALYSIS, W/ REFLEX TO CULTURE (INFECTION SUSPECTED)
Bilirubin Urine: NEGATIVE
Glucose, UA: NEGATIVE mg/dL
Hgb urine dipstick: NEGATIVE
Ketones, ur: NEGATIVE mg/dL
Nitrite: POSITIVE — AB
Protein, ur: NEGATIVE mg/dL
Specific Gravity, Urine: 1.02 (ref 1.005–1.030)
pH: 7 (ref 5.0–8.0)

## 2023-12-02 MED ORDER — CEFDINIR 300 MG PO CAPS: 300.0000 mg | ORAL_CAPSULE | Freq: Two times a day (BID) | ORAL | 0 refills | Status: AC

## 2023-12-02 NOTE — Discharge Instructions (Addendum)
 Stop by the pharmacy to pick up your prescriptions.  Follow up with your primary care provider or return to the urgent care, if not improving.  You may need to a urologist.

## 2023-12-02 NOTE — ED Provider Notes (Signed)
 [facility]    CSN: [id number] Arrival date & time: 12/02/23  1103      History   Chief Complaint Chief Complaint  Patient presents with   Dysuria     HPI HPI Andrea Pollard is a 68 y.o. female.   History provided by patient and her daughter (via telephone)  Andrea Pollard presents for burning with urination, urinary frequency with decreased urinary output. Has lower back pain and lower abdominal pain. Symptoms started 3 days ago.  She was taking Macrobid  a couple weeks ago and her symptoms came back slowly came back. She has incontinence and wears pads.   No fever, vaginal discharge, hematuria or vomiting. Has some cloudy urine and nausea.  No LMP recorded. Patient has had a hysterectomy.      Past Medical History:  Diagnosis Date   Acute pain of left wrist 01/15/2023   Anxiety    Arthritis of left acromioclavicular joint 01/15/2023   Arthritis of left wrist 01/15/2023   Asthma    Complication of anesthesia    COPD (chronic obstructive pulmonary disease) (HCC)    diagnosed from pulmonary function tests   Depression    Diabetes mellitus without complication (HCC)    DVT (deep venous thrombosis) (HCC)    Dvt femoral (deep venous thrombosis) (HCC) 2018   in lung also.  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 had surgery to drain cyst   History of palpitations 2019   due to a panic attack   Hypertension    Lung nodule 2019   being followed by dr. jacobo, dr volney and dr. isaiah   Pain in joint of left shoulder 01/15/2023   Pain of left hand 01/15/2023   Pneumonia 04/2016   PONV (postoperative nausea and vomiting)    NAUSEATED   Pulmonary emboli (HCC) 2018   Sleep apnea    CPAP   Tendinitis of left shoulder  01/15/2023   Vertigo     Patient Active Problem List   Diagnosis Date Noted   Acute cough 07/20/2023   Chronic obstructive pulmonary disease (HCC) 07/20/2023   History of colon polyps 06/07/2023   Obesity (BMI 30-39.9) 09/14/2022   Ataxia 09/14/2022   Polyp of descending colon 05/04/2022   Chronic diarrhea 04/20/2022   Hiatal hernia 04/20/2022   Adenomatous polyp of descending colon 04/20/2022   Anxiety 04/04/2022   Depression with suicidal ideation 04/04/2022   Sleep apnea 04/04/2022   Pain in joint of right shoulder 05/27/2021   Pulmonary embolism (HCC) 03/04/2017   Benign hypertension 10/05/2016   Constipation 10/05/2016   Gastroesophageal reflux disease 10/05/2016   Hypercholesterolemia 10/05/2016   Indigestion 10/05/2016   Obstructive sleep apnea of adult 10/05/2016   Type 2 diabetes mellitus (HCC) 10/05/2016   Vitamin D deficiency 10/05/2016   Coronary artery disease of native artery of native heart with stable angina pectoris (HCC) 09/27/2016   Aortic atherosclerosis (HCC) 09/27/2016   History of smoking 09/27/2016   Lung nodule 06/07/2016   Morbid obesity (HCC) 04/23/2014    Past Surgical History:  Procedure Laterality Date   ABDOMINAL HYSTERECTOMY  1994   CESAREAN SECTION     CHOLECYSTECTOMY  2013   COLONOSCOPY WITH PROPOFOL  N/A 04/20/2022  Procedure: COLONOSCOPY WITH PROPOFOL ;  Surgeon: Unk Corinn Skiff, MD;  Location: [facility] ENDOSCOPY;  Service: Gastroenterology;  Laterality: N/A;   COLONOSCOPY WITH PROPOFOL  N/A 06/07/2023   Procedure: COLONOSCOPY WITH PROPOFOL ;  Surgeon: Unk Corinn Skiff, MD;  Location: [facility] ENDOSCOPY;  Service: Gastroenterology;  Laterality: N/A;   CYST REMOVAL NECK  2013   was ingrown hair . positive for mrsa at that time prior to removal in OR   CYSTOSCOPY WITH URETEROSCOPY AND STENT PLACEMENT Right 05/31/2018   Procedure: CYSTOSCOPY WITH URETEROSCOPY, LASER LITHOTRIPSY AND STENT PLACEMENT;  Surgeon: Francisca Redell BROCKS, MD;  Location:  [facility] ORS;  Service: Urology;  Laterality: Right;   DIAGNOSTIC LAPAROSCOPY     ELECTROMAGNETIC NAVIGATION BROCHOSCOPY N/A 08/15/2016   Procedure: ELECTROMAGNETIC NAVIGATION BRONCHOSCOPY;  Surgeon: Nickolas Cellar, MD;  Location: [facility] ORS;  Service: Cardiopulmonary;  Laterality: N/A;   ELECTROMAGNETIC NAVIGATION BROCHOSCOPY Right 11/07/2017   Procedure: ELECTROMAGNETIC NAVIGATION BRONCHOSCOPY;  Surgeon: Cellar Nickolas, MD;  Location: [facility] ORS;  Service: Cardiopulmonary;  Laterality: Right;   ESOPHAGOGASTRODUODENOSCOPY (EGD) WITH PROPOFOL  N/A 04/20/2022   Procedure: ESOPHAGOGASTRODUODENOSCOPY (EGD) WITH PROPOFOL ;  Surgeon: Unk Corinn Skiff, MD;  Location: [facility] ENDOSCOPY;  Service: Gastroenterology;  Laterality: N/A;   FLEXIBLE SIGMOIDOSCOPY N/A 05/04/2022   Procedure: FLEXIBLE SIGMOIDOSCOPY;  Surgeon: Unk Corinn Skiff, MD;  Location: [facility] ENDOSCOPY;  Service: Gastroenterology;  Laterality: N/A;   LAPAROSCOPIC GASTRIC SLEEVE RESECTION  2015   lost 75 pounds    OB History   No obstetric history on file.      Home Medications    Prior to Admission medications   Medication Sig Start Date End Date Taking? Authorizing Provider  albuterol  (VENTOLIN  HFA) 108 (90 Base) MCG/ACT inhaler Inhale 1-2 puffs into the lungs every 6 (six) hours as needed for wheezing or shortness of breath. 11/06/23  Yes Arvis Huxley B, PA-C  atorvastatin (LIPITOR) 80 MG tablet Take 80 mg by mouth at bedtime.  08/30/16  Yes [provider]  dicyclomine  (BENTYL ) 20 MG tablet Take 1 tablet (20 mg total) by mouth 3 (three) times daily before meals. 05/04/23 12/02/23 Yes [place], PA-C  empagliflozin (JARDIANCE) 10 MG TABS tablet Take 10 mg by mouth daily.   Yes [provider]  FLOVENT HFA 220 MCG/ACT inhaler Inhale 2 puffs into the lungs daily. 12/11/16  Yes [provider]  hydrOXYzine (VISTARIL) 25 MG capsule Take 25-50 mg by mouth 3 (three) times daily as needed.   Yes [provider]   lansoprazole (PREVACID) 15 MG capsule Take 15 mg by mouth at bedtime.    Yes [provider]  lisinopril (ZESTRIL) 10 MG tablet Take 10 mg by mouth daily. 04/28/22  Yes [provider]  LISINOPRIL PO    Yes [provider]  meclizine  (ANTIVERT ) 25 MG tablet    Yes [provider]  montelukast (SINGULAIR) 10 MG tablet Take 10 mg by mouth daily.   Yes [provider]  ondansetron  (ZOFRAN ) 4 MG tablet Take 1 tablet (4 mg total) by mouth every 8 (eight) hours as needed for nausea or vomiting. 04/19/22  Yes Vanga, Corinn Skiff, MD  [facility] VERIO test strip TEST ONCE D 09/21/18  Yes Harlee Lynwood LABOR, MD  OZEMPIC, 0.25 OR 0.5 MG/DOSE, 2 MG/1.5ML SOPN Inject 0.5 mg as directed once a week. 08/19/20  Yes [provider]  PARoxetine (PAXIL) 30 MG tablet Take 30 mg by mouth at bedtime. 07/03/23  Yes [provider]  REXULTI 1 MG TABS tablet Take 1 mg by  mouth daily. 11/12/23  Yes [provider]  sertraline  (ZOLOFT ) 100 MG tablet Take 2 tablets (200 mg total) by mouth daily. 09/15/22  Yes Wouk, Devaughn Sayres, MD  sertraline  (ZOLOFT ) 50 MG tablet Take 200 mg by mouth daily. 12/12/22  Yes [provider]  traZODone (DESYREL) 150 MG tablet Take 150 mg by mouth at bedtime.   Yes [provider]  albuterol  (VENTOLIN  HFA) 108 (90 Base) MCG/ACT inhaler Inhale 1-2 puffs into the lungs every 6 (six) hours as needed. 03/30/22   Corlis Burnard DEL, NP  nitrofurantoin , macrocrystal-monohydrate, (MACROBID ) 100 MG capsule Take 1 capsule (100 mg total) by mouth 2 (two) times daily. 11/17/23   Bernardino Ditch, NP  phenazopyridine  (PYRIDIUM ) 200 MG tablet Take 1 tablet (200 mg total) by mouth 3 (three) times daily. 11/17/23   Bernardino Ditch, NP    Family History Family History  Problem Relation Age of Onset   Stroke Father    Ovarian cancer Maternal Grandmother    Breast cancer Neg Hx     Social History Social History   Tobacco Use   Smoking  status: Former    Current packs/day: 0.00    Average packs/day: 1 pack/day for 5.0 years (5.0 ttl pk-yrs)    Types: Cigarettes    Start date: 08/12/1986    Quit date: 08/12/1991    Years since quitting: 32.3   Smokeless tobacco: Never  Vaping Use   Vaping status: Never Used  Substance Use Topics   Alcohol use: No   Drug use: No     Allergies   Levofloxacin, Nitroglycerin, Sulfamethoxazole-trimethoprim, Ibuprofen, Levaquin [levofloxacin in d5w], and Metformin   Review of Systems Review of Systems: :negative unless otherwise stated in HPI.      Physical Exam Triage Vital Signs ED Triage Vitals  Encounter Vitals Group     BP 12/02/23 1156 134/83     Girls Systolic BP Percentile --      Girls Diastolic BP Percentile --      Boys Systolic BP Percentile --      Boys Diastolic BP Percentile --      Pulse Rate 12/02/23 1156 70     Resp 12/02/23 1156 16     Temp 12/02/23 1156 98 F (36.7 C)     Temp Source 12/02/23 1156 Oral     SpO2 12/02/23 1156 97 %     Weight 12/02/23 1155 200 lb (90.7 kg)     Height 12/02/23 1155 5' 5 (1.651 m)     Head Circumference --      Peak Flow --      Pain Score 12/02/23 1201 8     Pain Loc --      Pain Education --      Exclude from Growth Chart --    No data found.  Updated Vital Signs BP 134/83 (BP Location: Right Arm)   Pulse 70   Temp 98 F (36.7 C) (Oral)   Resp 16   Ht 5' 5 (1.651 m)   Wt 90.7 kg   SpO2 97%   BMI 33.28 kg/m   Visual Acuity Right Eye Distance:   Left Eye Distance:   Bilateral Distance:    Right Eye Near:   Left Eye Near:    Bilateral Near:     Physical Exam GEN: well appearing female in no acute distress  CVS: well perfused  RESP: speaking in full sentences without pause  ABD: soft, non-tender, non-distended, no palpable masses ***  GU:  deferred, patient performed self swab  ***Pelvic exam: normal external genitalia, vulva, VAGINA and CERVIX: {details:315904::normal appearing cervix without  discharge or lesions}, ADNEXA: normal adnexa in size, nontender and no masses, WET MOUNT done - results: {:[id number]}, exam chaperoned by CMA.    UC Treatments / Results  Labs (all labs ordered are listed, but only abnormal results are displayed) Labs Reviewed  URINALYSIS, W/ REFLEX TO CULTURE (INFECTION SUSPECTED) - Abnormal; Notable for the following components:      Result Value   APPearance HAZY (*)    Nitrite POSITIVE (*)    Leukocytes,Ua MODERATE (*)    Bacteria, UA MANY (*)    All other components within normal limits    EKG   Radiology No results found.  Procedures Procedures (including critical care time)  Medications Ordered in UC Medications - No data to display  Initial Impression / Assessment and Plan / UC Course  I have reviewed the triage vital signs and the nursing notes.  Pertinent labs & imaging results that were available during my care of the patient were reviewed by me and considered in my medical decision making (see chart for details).     *** Patient is a 68 y.o.SABRA female  who presents for **.  Overall patient is well-appearing and afebrile.  Vital signs stable.  UA ***consistent with acute cystitis.   Hematuria ***supported on microscopy.  Treat with ***Macrobid  2 times daily for 5 days. Urine culture obtained.***  Follow-up sensitivities and change antibiotics, if needed.  ***Vaginal swab for yeast vaginitis and bacterial vaginitis, trichomonas, gonorrhea and chlamydia obtained.   - Treatment:***Macrobid  twice daily for 5 days.  Flagyl  500 BID x 7 days and advised patient to not drink alcohol while taking this medication.  - ***Diflucan  for 2 doses for prevention of*** yeast infection  - ***Abstain from coitus during course of treatment.  - *** G/C Chlamydia and call in Rx if positive.    Return precautions including abdominal pain, fever, chills, nausea, or vomiting given. Discussed MDM, treatment plan and plan for follow-up with patient ***who agrees  with plan.    STI Screening *** GC and chlamydia DNA  probe sent to lab. HIV and RPR recommended and ***declined***collected. ***Negative Trichomoniasis on wet prep.  - Treatment: ***500 mg IM CTX, po doxycycline  100 mg BID x7 days - Abstain from coitus during course of treatment - Advised patient to use barrier protection/condoms.  - F/U if symptoms not improving or getting worse.  - Safe sex handout given.     ***Acute cystitis:  Patient is a 68 y.o. female  who presents for ***days of ***dysuria and ***urinary urgency.  Overall patient is well-appearing and afebrile.  Vital signs stable.  UA consistent with ***acute cystitis.  Hematuria not ***supported on microscopy.  Patient has *** UTI in the past year. Treat with ***Macrobid  2 times daily for 5 days.  ***Urine culture obtained.  Follow-up sensitivities and change antibiotics, if needed.   - Return precautions including abdominal pain, fever, chills, nausea, or vomiting given. Follow-up,  if symptoms not improving or getting worse. Discussed MDM, treatment plan and plan for follow-up with patient***who agrees with plan.        Final Clinical Impressions(s) / UC Diagnoses   Final diagnoses:  Acute cystitis with hematuria   Discharge Instructions   None    ED Prescriptions   None    PDMP not reviewed this encounter.

## 2023-12-02 NOTE — ED Triage Notes (Signed)
Pt c/o urinary freq,burning & pain x3 days. Denies any hematuria.

## 2023-12-04 ENCOUNTER — Ambulatory Visit (HOSPITAL_COMMUNITY): Payer: Self-pay

## 2023-12-04 LAB — URINE CULTURE: Culture: >=100000 — AB

## 2023-12-07 ENCOUNTER — Encounter: Payer: Self-pay | Admitting: Podiatry

## 2023-12-07 ENCOUNTER — Ambulatory Visit: Admitting: Podiatry

## 2023-12-07 ENCOUNTER — Encounter: Payer: Self-pay | Admitting: Advanced Practice Midwife

## 2023-12-07 DIAGNOSIS — M79674 Pain in right toe(s): Secondary | ICD-10-CM | POA: Diagnosis not present

## 2023-12-07 DIAGNOSIS — M79675 Pain in left toe(s): Secondary | ICD-10-CM | POA: Diagnosis not present

## 2023-12-07 DIAGNOSIS — M2042 Other hammer toe(s) (acquired), left foot: Secondary | ICD-10-CM

## 2023-12-07 DIAGNOSIS — E119 Type 2 diabetes mellitus without complications: Secondary | ICD-10-CM | POA: Diagnosis not present

## 2023-12-07 DIAGNOSIS — B351 Tinea unguium: Secondary | ICD-10-CM | POA: Diagnosis not present

## 2023-12-07 DIAGNOSIS — M2041 Other hammer toe(s) (acquired), right foot: Secondary | ICD-10-CM | POA: Diagnosis not present

## 2023-12-07 NOTE — Patient Instructions (Signed)

## 2023-12-12 ENCOUNTER — Encounter: Payer: Self-pay | Admitting: Podiatry

## 2023-12-12 NOTE — Progress Notes (Signed)
 Subjective: Andrea Pollard presents today with chief concern of diabetes with elongated, thickened, painful, discolored toenails for months. Aggravating factor(s) include wearing enclosed shoe gear. Patient has tried none. Chief Complaint  Patient presents with   [facility]    Rm1 Diabetic Pr. Pender June 2025 A1c 5   Patient denies any h/o foot wounds.  PCP is Kandis Stefano Iles, MD.  Past Medical History:  Diagnosis Date   Acute pain of left wrist 01/15/2023   Anxiety    Arthritis of left acromioclavicular joint 01/15/2023   Arthritis of left wrist 01/15/2023   Asthma    Complication of anesthesia    COPD (chronic obstructive pulmonary disease) (HCC)    diagnosed from pulmonary function tests   Depression    Diabetes mellitus without complication (HCC)    DVT (deep venous thrombosis) (HCC)    Dvt femoral (deep venous thrombosis) (HCC) 2018   in lung also.  treated with xarelto  for 6 months   GERD (gastroesophageal reflux disease)    Hepatitis    AGE 52-HEPATITIS B   History of hiatal hernia    History of kidney stones 2019   History of methicillin resistant staphylococcus aureus (MRSA) 2013   in neck due to ingrown hair. had surgery to drain cyst   History of palpitations 2019   due to a panic attack   Hypertension    Lung nodule 2019   being followed by dr. jacobo, dr volney and dr. isaiah   Pain in joint of left shoulder 01/15/2023   Pain of left hand 01/15/2023   Pneumonia 04/2016   PONV (postoperative nausea and vomiting)    NAUSEATED   Pulmonary emboli (HCC) 2018   Sleep apnea    CPAP   Tendinitis of left shoulder 01/15/2023   Vertigo     Patient Active Problem List   Diagnosis Date Noted   Acute cough 07/20/2023   Chronic obstructive pulmonary disease (HCC) 07/20/2023   History of colon polyps 06/07/2023   Obesity (BMI 30-39.9) 09/14/2022   Ataxia 09/14/2022   Polyp of descending colon 05/04/2022   Chronic diarrhea 04/20/2022   Hiatal hernia  04/20/2022   Adenomatous polyp of descending colon 04/20/2022   Anxiety 04/04/2022   Depression with suicidal ideation 04/04/2022   Sleep apnea 04/04/2022   Pain in joint of right shoulder 05/27/2021   Pulmonary embolism (HCC) 03/04/2017   Benign hypertension 10/05/2016   Constipation 10/05/2016   Gastroesophageal reflux disease 10/05/2016   Hypercholesterolemia 10/05/2016   Indigestion 10/05/2016   Obstructive sleep apnea of adult 10/05/2016   Type 2 diabetes mellitus (HCC) 10/05/2016   Vitamin D deficiency 10/05/2016   Coronary artery disease of native artery of native heart with stable angina pectoris (HCC) 09/27/2016   Aortic atherosclerosis (HCC) 09/27/2016   History of smoking 09/27/2016   Lung nodule 06/07/2016   Morbid obesity (HCC) 04/23/2014    Past Surgical History:  Procedure Laterality Date   ABDOMINAL HYSTERECTOMY  1994   CESAREAN SECTION     CHOLECYSTECTOMY  2013   COLONOSCOPY WITH PROPOFOL  N/A 04/20/2022   Procedure: COLONOSCOPY WITH PROPOFOL ;  Surgeon: Unk Corinn Skiff, MD;  Location: [facility] ENDOSCOPY;  Service: Gastroenterology;  Laterality: N/A;   COLONOSCOPY WITH PROPOFOL  N/A 06/07/2023   Procedure: COLONOSCOPY WITH PROPOFOL ;  Surgeon: Unk Corinn Skiff, MD;  Location: [facility] ENDOSCOPY;  Service: Gastroenterology;  Laterality: N/A;   CYST REMOVAL NECK  2013   was ingrown hair . positive for mrsa at that time prior to removal  in OR   CYSTOSCOPY WITH URETEROSCOPY AND STENT PLACEMENT Right 05/31/2018   Procedure: CYSTOSCOPY WITH URETEROSCOPY, LASER LITHOTRIPSY AND STENT PLACEMENT;  Surgeon: Francisca Redell BROCKS, MD;  Location: [facility] ORS;  Service: Urology;  Laterality: Right;   DIAGNOSTIC LAPAROSCOPY     ELECTROMAGNETIC NAVIGATION BROCHOSCOPY N/A 08/15/2016   Procedure: ELECTROMAGNETIC NAVIGATION BRONCHOSCOPY;  Surgeon: Nickolas Cellar, MD;  Location: [facility] ORS;  Service: Cardiopulmonary;  Laterality: N/A;   ELECTROMAGNETIC NAVIGATION BROCHOSCOPY Right 11/07/2017    Procedure: ELECTROMAGNETIC NAVIGATION BRONCHOSCOPY;  Surgeon: Cellar Nickolas, MD;  Location: [facility] ORS;  Service: Cardiopulmonary;  Laterality: Right;   ESOPHAGOGASTRODUODENOSCOPY (EGD) WITH PROPOFOL  N/A 04/20/2022   Procedure: ESOPHAGOGASTRODUODENOSCOPY (EGD) WITH PROPOFOL ;  Surgeon: Unk Corinn Skiff, MD;  Location: [facility] ENDOSCOPY;  Service: Gastroenterology;  Laterality: N/A;   FLEXIBLE SIGMOIDOSCOPY N/A 05/04/2022   Procedure: FLEXIBLE SIGMOIDOSCOPY;  Surgeon: Unk Corinn Skiff, MD;  Location: [facility] ENDOSCOPY;  Service: Gastroenterology;  Laterality: N/A;   LAPAROSCOPIC GASTRIC SLEEVE RESECTION  2015   lost 75 pounds    Current Outpatient Medications on File Prior to Visit  Medication Sig Dispense Refill   albuterol  (VENTOLIN  HFA) 108 (90 Base) MCG/ACT inhaler Inhale 1-2 puffs into the lungs every 6 (six) hours as needed. 18 g 0   albuterol  (VENTOLIN  HFA) 108 (90 Base) MCG/ACT inhaler Inhale 1-2 puffs into the lungs every 6 (six) hours as needed for wheezing or shortness of breath. 1 each 0   atorvastatin (LIPITOR) 80 MG tablet Take 80 mg by mouth at bedtime.   2   cefdinir  (OMNICEF ) 300 MG capsule Take 1 capsule (300 mg total) by mouth 2 (two) times daily. 20 capsule 0   dicyclomine  (BENTYL ) 20 MG tablet Take 1 tablet (20 mg total) by mouth 3 (three) times daily before meals. 90 tablet 3   empagliflozin (JARDIANCE) 10 MG TABS tablet Take 10 mg by mouth daily.     FLOVENT HFA 220 MCG/ACT inhaler Inhale 2 puffs into the lungs daily.  5   hydrOXYzine (VISTARIL) 25 MG capsule Take 25-50 mg by mouth 3 (three) times daily as needed.     lansoprazole (PREVACID) 15 MG capsule Take 15 mg by mouth at bedtime.      lisinopril (ZESTRIL) 10 MG tablet Take 10 mg by mouth daily.     LISINOPRIL PO      meclizine  (ANTIVERT ) 25 MG tablet      montelukast (SINGULAIR) 10 MG tablet Take 10 mg by mouth daily.     ondansetron  (ZOFRAN ) 4 MG tablet Take 1 tablet (4 mg total) by mouth every 8 (eight) hours as  needed for nausea or vomiting. 14 tablet 0   ONETOUCH VERIO test strip TEST ONCE D 100 each 3   OZEMPIC, 0.25 OR 0.5 MG/DOSE, 2 MG/1.5ML SOPN Inject 0.5 mg as directed once a week.     PARoxetine (PAXIL) 30 MG tablet Take 30 mg by mouth at bedtime.     REXULTI 1 MG TABS tablet Take 1 mg by mouth daily.     sertraline  (ZOLOFT ) 100 MG tablet Take 2 tablets (200 mg total) by mouth daily.  3   sertraline  (ZOLOFT ) 50 MG tablet Take 200 mg by mouth daily.     traZODone (DESYREL) 150 MG tablet Take 150 mg by mouth at bedtime.     No current facility-administered medications on file prior to visit.     Allergies  Allergen Reactions   Levofloxacin Hives and Other (See Comments)   Nitroglycerin Other (See Comments) and Hives  Severe Hypotension  Other Reaction(s): hypotensive   Sulfamethoxazole-Trimethoprim Hives and Rash   Ibuprofen Other (See Comments)    Cannot Use due to Gastric SLEEVE SURGERY   Levaquin [Levofloxacin In D5w] Hives   Metformin Diarrhea    Social History   Occupational History   Not on file  Tobacco Use   Smoking status: Former    Current packs/day: 0.00    Average packs/day: 1 pack/day for 5.0 years (5.0 ttl pk-yrs)    Types: Cigarettes    Start date: 08/12/1986    Quit date: 08/12/1991    Years since quitting: 32.3   Smokeless tobacco: Never  Vaping Use   Vaping status: Never Used  Substance and Sexual Activity   Alcohol use: No   Drug use: No   Sexual activity: Not on file    Family History  Problem Relation Age of Onset   Stroke Father    Ovarian cancer Maternal Grandmother    Breast cancer Neg Hx     Immunization History  Administered Date(s) Administered   Influenza Inj Mdck Quad Pf 03/05/2018   Influenza,inj,Quad PF,6+ Mos 02/21/2014   Moderna Sars-Covid-2 Vaccination 09/25/2019, 10/23/2019   Pneumococcal Polysaccharide-23 04/24/2014    Objective: There were no vitals filed for this visit.  Andrea Pollard is a pleasant 68 y.o.  female in NAD. AAO X 3.   Diabetic foot exam was performed with the following findings:   Vascular Examination: Capillary refill time immediate b/l. Vascular status intact b/l with palpable DP pulses; faintly palpable PT pulses. Pedal hair absent b/l.  No pain with calf compression b/l. Skin temperature gradient WNL b/l. No cyanosis or clubbing noted b/l. No ischemia or gangrene b/l. Trace edema noted BLE.  Neurological Examination: Sensation grossly intact b/l with 10 gram monofilament. Vibratory sensation intact b/l.   Dermatological Examination: Pedal skin with normal turgor, texture and tone b/l.  No open wounds. No interdigital macerations.   Toenails 1-5 b/l thick, discolored, elongated with subungual debris and pain on dorsal palpation.   No hyperkeratotic nor porokeratotic lesions present on today's visit.  Musculoskeletal Examination: Muscle strength 5/5 to all lower extremity muscle groups bilaterally. Hammertoe deformity noted 2-5 b/l.  Radiographs: None      Lab Results  Component Value Date   HGBA1C 5.6 09/14/2022   Assessment: 1. Pain due to onychomycosis of toenails of both feet   2. Acquired hammertoes of both feet   3. Type 2 diabetes mellitus without complication, without long-term current use of insulin  (HCC)   4. Encounter for diabetic foot exam (HCC)     ADA Risk Categorization: Low Risk:  Patient has all of the following: Intact protective sensation No prior foot ulcer  No severe deformity Pedal pulses present  Plan: Diabetic foot examination performed today. All patient's and/or POA's questions/concerns addressed on today's visit. Toenails 1-5 debrided in length and girth without incident. Continue foot and shoe inspections daily. Monitor blood glucose per PCP/Endocrinologist's recommendations. Continue soft, supportive shoe gear daily. Report any pedal injuries to medical professional. Call office if there are any questions/concerns. -Patient/POA to  call should there be question/concern in the interim.  Return in about 3 months (around 03/08/2024).  Delon LITTIE Merlin, DPM      [facility] LOCATION: [address]  [place], [place] [address]                   Office [phone]   [facility] LOCATION: [address] [place], [place] [address] Office [phone]

## 2023-12-19 ENCOUNTER — Other Ambulatory Visit: Payer: Self-pay | Admitting: Medical Genetics

## 2024-01-03 ENCOUNTER — Encounter

## 2024-02-05 ENCOUNTER — Encounter

## 2024-03-04 ENCOUNTER — Encounter: Payer: Self-pay | Admitting: Emergency Medicine

## 2024-03-04 ENCOUNTER — Ambulatory Visit
Admission: EM | Admit: 2024-03-04 | Discharge: 2024-03-04 | Disposition: A | Discharge status: Home / Self Care | Attending: Emergency Medicine | Admitting: Emergency Medicine

## 2024-03-04 DIAGNOSIS — K146 Glossodynia: Secondary | ICD-10-CM | POA: Diagnosis not present

## 2024-03-04 LAB — GROUP A STREP BY PCR: Group A Strep by PCR: NOT DETECTED

## 2024-03-04 MED ORDER — LIDOCAINE VISCOUS HCL 2 % MT SOLN: 15.0000 mL | Freq: Four times a day (QID) | OROMUCOSAL | 0 refills | Status: AC | PRN

## 2024-03-04 NOTE — ED Triage Notes (Signed)
 Pt c/o sore throat & R ear pain x2 days. States pain radiates to tongue. No OTC meds.

## 2024-03-04 NOTE — Discharge Instructions (Addendum)
 Your strep test today was negative.  The burning sensation you are experiencing your tongue may have several possible causes.  Some of the medication you are on, such as your lisinopril, your Paxil, and your Zoloft  can all cause burning tongue sensation.  There is also possibility of nutritional deficit to include B12 or iron deficiencies.  I would recommend that you make an appointment to follow-up with your primary care provider to have blood work drawn to check your B vitamin and iron levels.  Also discussed possibly changing your medications to see if your symptoms improve.  You may use the Magic mouthwash 4 times a day as needed for the burning tongue sensation.  You may swish and spit 1 tablespoon every 6 hours as needed.

## 2024-03-04 NOTE — ED Provider Notes (Signed)
 [facility]    CSN: [id number] Arrival date & time: 03/04/24  1552      History   Chief Complaint Chief Complaint  Patient presents with   Sore Throat   Otalgia    HPI Andrea Pollard is a 68 y.o. female.   HPI  68 year old female with a past medical history significant for COPD, depression, diabetes, DVT, asthma, anxiety, hypertension, and PE presents for evaluation of right sided sore throat and right ear pain that started 2 days ago.  She reports that the pain radiates along her tongue and she describes the pain as a burning sensation.  No fever or upper or lower respiratory symptoms.  She denies any trauma to her tongue.  Past Medical History:  Diagnosis Date   Acute pain of left wrist 01/15/2023   Anxiety    Arthritis of left acromioclavicular joint 01/15/2023   Arthritis of left wrist 01/15/2023   Asthma    Complication of anesthesia    COPD (chronic obstructive pulmonary disease) (HCC)    diagnosed from pulmonary function tests   Depression    Diabetes mellitus without complication (HCC)    DVT (deep venous thrombosis) (HCC)    Dvt femoral (deep venous thrombosis) (HCC) 2018   in lung also.  treated with xarelto  for 6 months   GERD (gastroesophageal reflux disease)    Hepatitis    AGE 67-HEPATITIS B   History of hiatal hernia    History of kidney stones 2019   History of methicillin resistant staphylococcus aureus (MRSA) 2013   in neck due to ingrown hair. had surgery to drain cyst   History of palpitations 2019   due to a panic attack   Hypertension    Lung nodule 2019   being followed by dr. jacobo, dr volney and dr. isaiah   Pain in joint of left shoulder 01/15/2023   Pain of left hand 01/15/2023   Pneumonia 04/2016   PONV (postoperative nausea and vomiting)    NAUSEATED   Pulmonary emboli (HCC) 2018   Sleep apnea    CPAP   Tendinitis of left shoulder 01/15/2023   Vertigo     Patient Active Problem List   Diagnosis Date Noted    Acute cough 07/20/2023   Chronic obstructive pulmonary disease (HCC) 07/20/2023   History of colon polyps 06/07/2023   Obesity (BMI 30-39.9) 09/14/2022   Ataxia 09/14/2022   Polyp of descending colon 05/04/2022   Chronic diarrhea 04/20/2022   Hiatal hernia 04/20/2022   Adenomatous polyp of descending colon 04/20/2022   Anxiety 04/04/2022   Depression with suicidal ideation 04/04/2022   Sleep apnea 04/04/2022   Pain in joint of right shoulder 05/27/2021   Pulmonary embolism (HCC) 03/04/2017   Benign hypertension 10/05/2016   Constipation 10/05/2016   Gastroesophageal reflux disease 10/05/2016   Hypercholesterolemia 10/05/2016   Indigestion 10/05/2016   Obstructive sleep apnea of adult 10/05/2016   Type 2 diabetes mellitus (HCC) 10/05/2016   Vitamin D deficiency 10/05/2016   Coronary artery disease of native artery of native heart with stable angina pectoris 09/27/2016   Aortic atherosclerosis 09/27/2016   History of smoking 09/27/2016   Lung nodule 06/07/2016   Morbid obesity (HCC) 04/23/2014    Past Surgical History:  Procedure Laterality Date   ABDOMINAL HYSTERECTOMY  1994   CESAREAN SECTION     CHOLECYSTECTOMY  2013   COLONOSCOPY WITH PROPOFOL  N/A 04/20/2022   Procedure: COLONOSCOPY WITH PROPOFOL ;  Surgeon: Unk Corinn Skiff, MD;  Location: [facility]  ENDOSCOPY;  Service: Gastroenterology;  Laterality: N/A;   COLONOSCOPY WITH PROPOFOL  N/A 06/07/2023   Procedure: COLONOSCOPY WITH PROPOFOL ;  Surgeon: Unk Corinn Skiff, MD;  Location: [facility] ENDOSCOPY;  Service: Gastroenterology;  Laterality: N/A;   CYST REMOVAL NECK  2013   was ingrown hair . positive for mrsa at that time prior to removal in OR   CYSTOSCOPY WITH URETEROSCOPY AND STENT PLACEMENT Right 05/31/2018   Procedure: CYSTOSCOPY WITH URETEROSCOPY, LASER LITHOTRIPSY AND STENT PLACEMENT;  Surgeon: Francisca Redell BROCKS, MD;  Location: [facility] ORS;  Service: Urology;  Laterality: Right;   DIAGNOSTIC LAPAROSCOPY     ELECTROMAGNETIC  NAVIGATION BROCHOSCOPY N/A 08/15/2016   Procedure: ELECTROMAGNETIC NAVIGATION BRONCHOSCOPY;  Surgeon: Nickolas Cellar, MD;  Location: [facility] ORS;  Service: Cardiopulmonary;  Laterality: N/A;   ELECTROMAGNETIC NAVIGATION BROCHOSCOPY Right 11/07/2017   Procedure: ELECTROMAGNETIC NAVIGATION BRONCHOSCOPY;  Surgeon: Cellar Nickolas, MD;  Location: [facility] ORS;  Service: Cardiopulmonary;  Laterality: Right;   ESOPHAGOGASTRODUODENOSCOPY (EGD) WITH PROPOFOL  N/A 04/20/2022   Procedure: ESOPHAGOGASTRODUODENOSCOPY (EGD) WITH PROPOFOL ;  Surgeon: Unk Corinn Skiff, MD;  Location: [facility] ENDOSCOPY;  Service: Gastroenterology;  Laterality: N/A;   FLEXIBLE SIGMOIDOSCOPY N/A 05/04/2022   Procedure: FLEXIBLE SIGMOIDOSCOPY;  Surgeon: Unk Corinn Skiff, MD;  Location: [facility] ENDOSCOPY;  Service: Gastroenterology;  Laterality: N/A;   LAPAROSCOPIC GASTRIC SLEEVE RESECTION  2015   lost 75 pounds    OB History   No obstetric history on file.      Home Medications    Prior to Admission medications   Medication Sig Start Date End Date Taking? Authorizing Provider  magic mouthwash (lidocaine , diphenhydrAMINE, alum & mag hydroxide) suspension Swish and spit 15 mLs 4 (four) times daily as needed for mouth pain. 03/04/24  Yes Bernardino Ditch, NP  traMADol  (ULTRAM ) 50 MG tablet Take 1 tablet every 4 hours by oral route as needed, for pain. 03/03/24  Yes [provider]  albuterol  (VENTOLIN  HFA) 108 (90 Base) MCG/ACT inhaler Inhale 1-2 puffs into the lungs every 6 (six) hours as needed. 03/30/22   Corlis Burnard DEL, NP  albuterol  (VENTOLIN  HFA) 108 (90 Base) MCG/ACT inhaler Inhale 1-2 puffs into the lungs every 6 (six) hours as needed for wheezing or shortness of breath. 11/06/23   Arvis Jolan NOVAK, PA-C  atorvastatin (LIPITOR) 80 MG tablet Take 80 mg by mouth at bedtime.  08/30/16   [provider]  cefdinir  (OMNICEF ) 300 MG capsule Take 1 capsule (300 mg total) by mouth 2 (two) times daily. 12/02/23   Brimage, Vondra, DO   dicyclomine  (BENTYL ) 20 MG tablet Take 1 tablet (20 mg total) by mouth 3 (three) times daily before meals. 05/04/23 12/07/23  [place], PA-C  empagliflozin (JARDIANCE) 10 MG TABS tablet Take 10 mg by mouth daily.    [provider]  FLOVENT HFA 220 MCG/ACT inhaler Inhale 2 puffs into the lungs daily. 12/11/16   [provider]  hydrOXYzine (VISTARIL) 25 MG capsule Take 25-50 mg by mouth 3 (three) times daily as needed.    [provider]  lansoprazole (PREVACID) 15 MG capsule Take 15 mg by mouth at bedtime.     [provider]  lisinopril (ZESTRIL) 10 MG tablet Take 10 mg by mouth daily. 04/28/22   [provider]  LISINOPRIL PO     [provider]  meclizine  (ANTIVERT ) 25 MG tablet     [provider]  montelukast (SINGULAIR) 10 MG tablet Take 10 mg by mouth daily.    [provider]  ondansetron  (ZOFRAN ) 4  MG tablet Take 1 tablet (4 mg total) by mouth every 8 (eight) hours as needed for nausea or vomiting. 04/19/22   Unk Corinn Skiff, MD  [facility] VERIO test strip TEST ONCE D 09/21/18   Harlee Lynwood LABOR, MD  OZEMPIC, 0.25 OR 0.5 MG/DOSE, 2 MG/1.5ML SOPN Inject 0.5 mg as directed once a week. 08/19/20   [provider]  PARoxetine (PAXIL) 30 MG tablet Take 30 mg by mouth at bedtime. 07/03/23   [provider]  REXULTI 1 MG TABS tablet Take 1 mg by mouth daily. 11/12/23   [provider]  sertraline  (ZOLOFT ) 100 MG tablet Take 2 tablets (200 mg total) by mouth daily. 09/15/22   Wouk, Devaughn Sayres, MD  sertraline  (ZOLOFT ) 50 MG tablet Take 200 mg by mouth daily. 12/12/22   [provider]  traZODone (DESYREL) 150 MG tablet Take 150 mg by mouth at bedtime.    [provider]    Family History Family History  Problem Relation Age of Onset   Stroke Father    Ovarian cancer Maternal Grandmother    Breast cancer Neg Hx     Social History Social History   Tobacco Use   Smoking  status: Former    Current packs/day: 0.00    Average packs/day: 1 pack/day for 5.0 years (5.0 ttl pk-yrs)    Types: Cigarettes    Start date: 08/12/1986    Quit date: 08/12/1991    Years since quitting: 32.5   Smokeless tobacco: Never  Vaping Use   Vaping status: Never Used  Substance Use Topics   Alcohol use: No   Drug use: No     Allergies   Ibuprofen, Levofloxacin, Nitroglycerin, Sulfamethoxazole-trimethoprim, Levaquin [levofloxacin in d5w], and Metformin   Review of Systems Review of Systems  Constitutional:  Negative for fever.  HENT:  Positive for ear pain and sore throat. Negative for congestion and rhinorrhea.   Respiratory:  Negative for cough.      Physical Exam Triage Vital Signs ED Triage Vitals  Encounter Vitals Group     BP      Girls Systolic BP Percentile      Girls Diastolic BP Percentile      Boys Systolic BP Percentile      Boys Diastolic BP Percentile      Pulse      Resp      Temp      Temp src      SpO2      Weight      Height      Head Circumference      Peak Flow      Pain Score      Pain Loc      Pain Education      Exclude from Growth Chart    No data found.  Updated Vital Signs BP 139/82 (BP Location: Right Arm)   Pulse 95   Temp 98.9 F (37.2 C) (Oral)   Resp 16   Ht 5' 5 (1.651 m)   Wt 219 lb (99.3 kg)   SpO2 95%   BMI 36.44 kg/m   Visual Acuity Right Eye Distance:   Left Eye Distance:   Bilateral Distance:    Right Eye Near:   Left Eye Near:    Bilateral Near:     Physical Exam Vitals and nursing note reviewed.  Constitutional:      Appearance: Normal appearance. She is not ill-appearing.  HENT:     Head: Normocephalic and  atraumatic.     Right Ear: Tympanic membrane, ear canal and external ear normal. There is no impacted cerumen.     Left Ear: Tympanic membrane, ear canal and external ear normal. There is no impacted cerumen.     Mouth/Throat:     Mouth: Mucous membranes are moist.     Pharynx:  Oropharynx is clear. Posterior oropharyngeal erythema present. No oropharyngeal exudate.     Comments: Tonsillar pillars are erythematous and mildly edematous without appreciable exudate.  Patient does have some mild erythema to both sides of her tongue as well as an ecchymotic patch in the middle underside of her tongue on the left-hand side.  She denies any trauma. Musculoskeletal:     Cervical back: Normal range of motion and neck supple. No tenderness.  Lymphadenopathy:     Cervical: No cervical adenopathy.  Skin:    General: Skin is warm and dry.     Capillary Refill: Capillary refill takes less than 2 seconds.     Findings: No rash.  Neurological:     Mental Status: She is alert.      UC Treatments / Results  Labs (all labs ordered are listed, but only abnormal results are displayed) Labs Reviewed  GROUP A STREP BY PCR    EKG   Radiology No results found.  Procedures Procedures (including critical care time)  Medications Ordered in UC Medications - No data to display  Initial Impression / Assessment and Plan / UC Course  I have reviewed the triage vital signs and the nursing notes.  Pertinent labs & imaging results that were available during my care of the patient were reviewed by me and considered in my medical decision making (see chart for details).   Patient is a pleasant, nontoxic-appearing 40 old female presenting for evaluation of right-sided sore throat and right ear pain as outlined HPI above.  Otoscopic examination of both ears reveals pearly gray tympanic membranes bilaterally with normal light reflex and clear external auditory canals.  The patient does have some mild erythema and 1+ edema of both tonsillar pillars as well as erythema along the sides of her tongue bilaterally.  She does have an ecchymotic patch on the inferior aspect of her tongue in the middle on the left-hand side which may represent an area of trauma.  She denies remember biting her  tongue.  She describes the pain as burning in nature.  I we will order a strep PCR to evaluate for the presence of strep.  As a differential diagnosis for burning tongue and clue dry mouth, thrush, allergic reaction to oral products, diabetes, thyroid  disorder, nutritional deficiencies to include vitamin B12 or iron, antidepressant since, and medication interaction.  The patient is on an ACE inhibitor, SSRI, and also has a history of diabetes which could all be contributing to the symptoms.  Strep PCR is negative.  I will discharge patient on the diagnosis of burning tongue.  I will prescribe Magic mouthwash that she can use 4 times a day to help with her symptoms.  As discussed above, it may be result of her medication.  It also could possibly be a B12 or iron deficiency.  I will have her follow-up with her primary care provider to have further blood work.   Final Clinical Impressions(s) / UC Diagnoses   Final diagnoses:  Tongue burning sensation     Discharge Instructions      Your strep test today was negative.  The burning sensation you are experiencing  your tongue may have several possible causes.  Some of the medication you are on, such as your lisinopril, your Paxil, and your Zoloft  can all cause burning tongue sensation.  There is also possibility of nutritional deficit to include B12 or iron deficiencies.  I would recommend that you make an appointment to follow-up with your primary care provider to have blood work drawn to check your B vitamin and iron levels.  Also discussed possibly changing your medications to see if your symptoms improve.  You may use the Magic mouthwash 4 times a day as needed for the burning tongue sensation.  You may swish and spit 1 tablespoon every 6 hours as needed.     ED Prescriptions     Medication Sig Dispense Auth. Provider   magic mouthwash (lidocaine , diphenhydrAMINE, alum & mag hydroxide) suspension Swish and spit 15 mLs 4 (four) times daily  as needed for mouth pain. 360 mL Bernardino Ditch, NP      PDMP not reviewed this encounter.   Bernardino Ditch, NP 03/04/24 1704

## 2024-03-13 ENCOUNTER — Ambulatory Visit: Admitting: Podiatry

## 2024-03-13 DIAGNOSIS — Z91199 Patient's noncompliance with other medical treatment and regimen due to unspecified reason: Secondary | ICD-10-CM

## 2024-03-13 NOTE — Progress Notes (Signed)
 1. No-show for appointment

## 2024-03-19 ENCOUNTER — Other Ambulatory Visit: Payer: Self-pay | Admitting: Medical Genetics

## 2024-03-19 DIAGNOSIS — Z006 Encounter for examination for normal comparison and control in clinical research program: Secondary | ICD-10-CM

## 2024-05-02 ENCOUNTER — Other Ambulatory Visit: Payer: Self-pay

## 2024-05-07 ENCOUNTER — Ambulatory Visit: Admitting: Family Medicine

## 2024-05-07 NOTE — Progress Notes (Deleted)
 05/07/2024 Andrea Pollard [id number] 09/26/1955  Gastroenterology Office Note    Referring Provider: Kandis Stefano Iles, MD Primary Care Physician:  Kandis Stefano Iles, MD  Primary GI Provider: Jinny Carmine, MD    Chief Complaint   No chief complaint on file.    History of Present Illness   Andrea Pollard is a 68 y.o. female with PMHX of IBS diarrhea presenting today at the request of Kandis, Stefano Iles, MD due to    Patient last seen by Ellouise Console, PA  on 11/14/2022 for follow-up on IBS.  She was instructed to take dicyclomine  3 times daily, start FiberCon, and take Imodium if needed.  She was to try low FODMAP diet.  06/07/2023 colonoscopy - The entire examined colon is normal.  - The distal rectum and anal verge are normal on retroflexion view. - A tattoo was seen in the descending colon. A post-polypectomy scar was found at the tattoo site. There was no evidence of residual polyp tissue.  - No specimens collected - Repeat in 5 years for surveillance  05/04/2022 flexible sigmoidoscopy - A single (solitary) ulcer in the descending colon. Tattooed.  - One 5 mm polyp in the descending colon, removed with a cold snare. Resected and retrieved. Tattooed. - Repeat in 1 year  04/20/2022 colonoscopy - The examined portion of the ileum was normal.  - One 10 mm polyp in the descending colon, removed with mucosal resection. Resected and retrieved. Clip manufacturer: [employer]. Clips (MR safe) were placed.  - Normal mucosa in the entire examined colon. Biopsied. - The distal rectum and anal verge are normal on retroflexion view.  - Mucosal resection was performed. Resection and retrieval were complete Past Medical History:  Diagnosis Date   Acute pain of left wrist 01/15/2023   Allergic rhinitis 01/01/2008   Anxiety    Arthritis of left acromioclavicular joint 01/15/2023   Arthritis of left wrist 01/15/2023   Asthma    Benign paroxysmal positional  vertigo 06/01/2020   Carpal tunnel syndrome, left 11/11/2015   Cold intolerance 09/25/2020   Complication of anesthesia    COPD (chronic obstructive pulmonary disease) (HCC)    diagnosed from pulmonary function tests   Depression    Diabetes mellitus without complication (HCC)    DVT (deep venous thrombosis) (HCC)    Dvt femoral (deep venous thrombosis) (HCC) 2018   in lung also.  treated with xarelto  for 6 months   Encounter for other screening for malignant neoplasm of breast 11/17/2008   Fatigue 10/26/2008   Gastroparesis 05/22/2012   Generalized anxiety disorder 05/11/2014   GERD (gastroesophageal reflux disease)    Hair loss 11/10/2019   Hearing loss 07/05/2015   Hepatitis    AGE 71-HEPATITIS B   History of hiatal hernia    History of kidney stones 2019   History of methicillin resistant staphylococcus aureus (MRSA) 2013   in neck due to ingrown hair. had surgery to drain cyst   History of palpitations 2019   due to a panic attack   Hypertension    Intention tremor 04/16/2023   Irritable bowel syndrome 09/19/2021   Low back pain 11/01/2020   Lung nodule 2019   being followed by dr. jacobo, dr volney and dr. 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   Peripheral neuropathy 07/02/2007   Pneumonia 04/2016   PONV (postoperative nausea and vomiting)    NAUSEATED   Pulmonary emboli (HCC) 2018   Raynaud's phenomenon 05/14/2019   Rosacea 07/05/2015   Routine history and physical examination of adult 03/30/2008   Shortness of breath 06/28/2021   Sleep apnea    CPAP   Steatosis of liver 07/02/2007   Tendinitis of  left shoulder 01/15/2023   Type 2 diabetes mellitus with peripheral neuropathy (HCC) 10/11/2016   Ulnar neuropathy of left upper extremity 03/22/2019   Venous stasis 09/07/2016   Vertigo    Yeast infection 10/25/2023    Past Surgical History:  Procedure Laterality Date   ABDOMINAL HYSTERECTOMY  1994   CESAREAN SECTION     CHOLECYSTECTOMY  2013   COLONOSCOPY WITH PROPOFOL  N/A 04/20/2022   Procedure: COLONOSCOPY WITH PROPOFOL ;  Surgeon: Unk Corinn Skiff, MD;  Location: [facility] ENDOSCOPY;  Service: Gastroenterology;  Laterality: N/A;   COLONOSCOPY WITH PROPOFOL  N/A 06/07/2023   Procedure: COLONOSCOPY WITH PROPOFOL ;  Surgeon: Unk Corinn Skiff, MD;  Location: [facility]COPY;  Service: Gastroenterology;  Laterality: N/A;   CYST REMOVAL NECK  2013   was ingrown hair . positive for mrsa at that time prior to removal in OR   CYSTOSCOPY WITH URETEROSCOPY AND STENT PLACEMENT Right 05/31/2018   Procedure: CYSTOSCOPY WITH URETEROSCOPY, LASER LITHOTRIPSY AND STENT PLACEMENT;  Surgeon: Francisca Redell BROCKS, MD;  Location: [facility] ORS;  Service: Urology;  Laterality: Right;   DIAGNOSTIC LAPAROSCOPY     ELECTROMAGNETIC NAVIGATION BROCHOSCOPY N/A 08/15/2016   Procedure: ELECTROMAGNETIC NAVIGATION BRONCHOSCOPY;  Surgeon: Nickolas Cellar, MD;  Location: [facility] ORS;  Service: Cardiopulmonary;  Laterality: N/A;   ELECTROMAGNETIC NAVIGATION BROCHOSCOPY Right 11/07/2017   Procedure: ELECTROMAGNETIC NAVIGATION BRONCHOSCOPY;  Surgeon: Cellar Nickolas, MD;  Location: [facility] ORS;  Service: Cardiopulmonary;  Laterality: Right;   ESOPHAGOGASTRODUODENOSCOPY (EGD) WITH PROPOFOL  N/A 04/20/2022   Procedure: ESOPHAGOGASTRODUODENOSCOPY (EGD) WITH PROPOFOL ;  Surgeon: Unk Corinn Skiff, MD;  Location: [facility] ENDOSCOPY;  Service: Gastroenterology;  Laterality: N/A;   FLEXIBLE SIGMOIDOSCOPY N/A 05/04/2022   Procedure: FLEXIBLE SIGMOIDOSCOPY;  Surgeon: Unk Corinn Skiff, MD;  Location[facility] ENDOSCOPY;  Service: Gastroenterology;  Laterality:  N/A;   LAPAROSCOPIC GASTRIC SLEEVE RESECTION  2015   lost 75 pounds    Current Outpatient Medications  Medication Sig Dispense Refill   albuterol  (VENTOLIN  HFA) 108 (90 Base) MCG/ACT inhaler Inhale 1-2 puffs into the lungs every 6 (six) hours as needed. 18 g 0   albuterol  (VENTOLIN  HFA) 108 (90 Base) MCG/ACT inhaler Inhale 1-2 puffs into the lungs every 6 (six) hours as needed for wheezing or shortness of breath. 1 each 0   atorvastatin (LIPITOR) 80 MG tablet Take 80 mg by mouth at bedtime.   2   cefdinir  (OMNICEF ) 300 MG capsule Take 1 capsule (300 mg total) by mouth 2 (two) times daily. 20 capsule 0   clotrimazole (CLOTRIMAZOLE ANTI-FUNGAL) 1 % cream 1 Application.     dicyclomine  (BENTYL ) 20 MG tablet Take 1 tablet (20 mg total) by mouth 3 (three) times daily before meals. 90 tablet 3   empagliflozin (JARDIANCE) 10 MG TABS tablet Take 10 mg by mouth daily.     FLOVENT HFA 220 MCG/ACT inhaler Inhale 2 puffs into the lungs daily.  5   gabapentin (NEURONTIN) 300 MG capsule Take 300 mg by mouth 3 (three) times daily.  hydrochlorothiazide (HYDRODIURIL) 25 MG tablet Take 25 mg by mouth daily.     hydrocortisone cream 1 % 1 Application.     hydrOXYzine (VISTARIL) 25 MG capsule Take 25-50 mg by mouth 3 (three) times daily as needed.     insulin  aspart (NOVOLOG  FLEXPEN) 100 UNIT/ML FlexPen Inject into the skin.     insulin  glargine (LANTUS SOLOSTAR) 100 UNIT/ML Solostar Pen      lansoprazole (PREVACID) 15 MG capsule Take 15 mg by mouth at bedtime.      lisinopril (ZESTRIL) 10 MG tablet Take 10 mg by mouth daily.     LISINOPRIL PO      magic mouthwash (lidocaine , diphenhydrAMINE, alum & mag hydroxide) suspension Swish and spit 15 mLs 4 (four) times daily as needed for mouth pain. 360 mL 0   meclizine  (ANTIVERT ) 25 MG tablet      montelukast (SINGULAIR) 10 MG tablet Take 10 mg by mouth daily.     ondansetron  (ZOFRAN ) 4 MG tablet Take 1 tablet (4 mg total) by mouth every 8 (eight) hours as  needed for nausea or vomiting. 14 tablet 0   ONETOUCH VERIO test strip TEST ONCE D 100 each 3   OZEMPIC, 0.25 OR 0.5 MG/DOSE, 2 MG/1.5ML SOPN Inject 0.5 mg as directed once a week.     PARoxetine (PAXIL) 30 MG tablet Take 30 mg by mouth at bedtime.     REXULTI 1 MG TABS tablet Take 1 mg by mouth daily.     sertraline  (ZOLOFT ) 100 MG tablet Take 2 tablets (200 mg total) by mouth daily.  3   sertraline  (ZOLOFT ) 50 MG tablet Take 200 mg by mouth daily.     traMADol  (ULTRAM ) 50 MG tablet Take 1 tablet every 4 hours by oral route as needed, for pain.     traZODone (DESYREL) 150 MG tablet Take 150 mg by mouth at bedtime.     No current facility-administered medications for this visit.    Allergies as of 05/07/2024 - Review Complete 03/04/2024  Allergen Reaction Noted   Ibuprofen Other (See Comments) 10/25/2014   Levofloxacin Hives and Other (See Comments) 07/02/2007   Nitroglycerin Other (See Comments) and Hives 07/11/2010   Sulfamethoxazole-trimethoprim Hives, Rash, and Dermatitis 02/06/2009   Levaquin [levofloxacin in d5w] Hives 10/25/2014   Sulfamethoxazole Other (See Comments) 05/19/2017   Trimethoprim Other (See Comments) 04/15/2017   Metformin Diarrhea 08/17/2011    Family History  Problem Relation Age of Onset   Stroke Father    Ovarian cancer Maternal Grandmother    Breast cancer Neg Hx     Social History   Socioeconomic History   Marital status: Divorced    Spouse name: Not on file   Number of children: Not on file   Years of education: Not on file   Highest education level: Not on file  Occupational History   Not on file  Tobacco Use   Smoking status: Former    Current packs/day: 0.00    Average packs/day: 1 pack/day for 5.0 years (5.0 ttl pk-yrs)    Types: Cigarettes    Start date: 08/12/1986    Quit date: 08/12/1991    Years since quitting: 32.7   Smokeless tobacco: Never  Vaping Use   Vaping status: Never Used  Substance and Sexual Activity   Alcohol use: No    Drug use: No   Sexual activity: Not on file  Other Topics Concern   Not on file  Social History Narrative   Not on file  Social Drivers of Health   Tobacco Use: Medium Risk (03/04/2024)   Patient History    Smoking Tobacco Use: Former    Smokeless Tobacco Use: Never    Passive Exposure: Not on file  Financial Resource Strain: Medium Risk (11/22/2023)   Received from [facility]   Overall Financial Resource Strain (CARDIA)    Difficulty of Paying Living Expenses: Somewhat hard  Food Insecurity: Food Insecurity Present (11/22/2023)   Received from [facility]   Epic    Within the past 12 months, you worried that your food would run out before you got the money to buy more.: Sometimes true    Within the past 12 months, the food you bought just didn't last and you didn't have money to get more.: Sometimes true  Transportation Needs: Unmet Transportation Needs (11/22/2023)   Received from [facility] - Transportation    In the past 12 months, has lack of transportation kept you from medical appointments or from getting medications?: Yes    Lack of Transportation (Non-Medical): Yes  Physical Activity: Not on file  Stress: No Stress Concern Present (11/22/2023)   Received from [facility] - Occupational Stress Questionnaire    Feeling of Stress : Not at all  Social Connections: Unknown (11/22/2023)   Received from [facility]   Social Connection and Isolation Panel    In a typical week, how many times do you talk on the phone with family, friends, or neighbors?: More than three times a week    How often do you get together with friends or relatives?: More than three times a week    How often do you attend church or religious services?: Never    Do you belong to any clubs or organizations such as church groups, unions, fraternal or athletic groups, or  school groups?: No    How often do you attend meetings of the clubs or organizations you belong to?: Never    Are you married, widowed, divorced, separated, never married, or living with a partner?: Patient declined  Intimate Partner Violence: Not At Risk (09/14/2022)   Humiliation, Afraid, Rape, and Kick questionnaire    Fear of Current or Ex-Partner: No    Emotionally Abused: No    Physically Abused: No    Sexually Abused: No  Depression (PHQ2-9): Not on file  Alcohol Screen: Not on file  Housing: Low Risk  (11/22/2023)   Received from [facility]   Epic    In the last 12 months, was there a time when you were not able to pay the mortgage or rent on time?: No    In the past 12 months, how many times have you moved where you were living?: 0    At any time in the past 12 months, were you homeless or living in a shelter (including now)?: No  Utilities: Not At Risk (11/22/2023)   Received from [facility]   Epic    In the past 12 months has the electric, gas, oil, or water company threatened to shut off services in your home?: No  Health Literacy: Adequate Health Literacy (11/22/2023)   Received from [facility]   B1300 Health Literacy    How often do you need to have someone help you when you read instructions, pamphlets, or other written material from your doctor or pharmacy?: Rarely  RELEVANT GI HISTORY, IMAGING AND LABS: CBC    Component Value Date/Time   WBC 5.2 09/14/2022 1152   RBC 5.28 (H) 09/14/2022 1152   HGB 13.7 09/14/2022 1152   HGB 13.6 03/25/2014 0609   HCT 42.3 09/14/2022 1152   HCT 40.8 03/25/2014 0609   PLT 165 09/14/2022 1152   PLT 184 03/25/2014 0609   MCV 80.1 09/14/2022 1152   MCV 85 03/25/2014 0609   MCH 25.9 (L) 09/14/2022 1152   MCHC 32.4 09/14/2022 1152   RDW 13.7 09/14/2022 1152   RDW 13.7 03/25/2014 0609   LYMPHSABS 1.5 09/14/2022 1152   LYMPHSABS 3.4 06/04/2013 1927   MONOABS 0.3 09/14/2022  1152   MONOABS 0.7 06/04/2013 1927   EOSABS 0.2 09/14/2022 1152   EOSABS 0.2 06/04/2013 1927   BASOSABS 0.1 09/14/2022 1152   BASOSABS 0.1 06/04/2013 1927   No results for input(s): HGB in the last 8760 hours.  CMP     Component Value Date/Time   NA 139 09/14/2022 1152   NA 136 03/25/2014 0609   K 4.0 09/14/2022 1152   K 5.2 (H) 03/25/2014 0609   CL 105 09/14/2022 1152   CL 101 03/25/2014 0609   CO2 26 09/14/2022 1152   CO2 26 03/25/2014 0609   GLUCOSE 96 09/14/2022 1152   GLUCOSE 357 (H) 03/25/2014 0609   BUN 15 09/14/2022 1152   BUN 16 03/25/2014 0609   CREATININE 0.80 02/23/2023 1115   CREATININE 0.72 03/25/2014 0609   CALCIUM 8.7 (L) 09/14/2022 1152   CALCIUM 8.7 03/25/2014 0609   PROT 7.0 09/14/2022 1152   PROT 6.4 04/25/2013 0608   ALBUMIN 3.9 09/14/2022 1152   ALBUMIN 3.2 (L) 04/25/2013 0608   AST 30 09/14/2022 1152   AST 52 (H) 04/25/2013 0608   ALT 18 09/14/2022 1152   ALT 48 04/25/2013 0608   ALKPHOS 84 09/14/2022 1152   ALKPHOS 97 04/25/2013 0608   BILITOT 0.9 09/14/2022 1152   BILITOT 0.6 04/25/2013 0608   GFRNONAA >60 09/14/2022 1152   GFRNONAA >60 03/25/2014 0609   GFRNONAA >60 07/28/2013 1255   GFRAA >60 12/07/2019 1145   GFRAA >60 03/25/2014 0609   GFRAA >60 07/28/2013 1255      Latest Ref Rng & Units 09/14/2022   11:52 AM 09/13/2021    1:00 PM 05/29/2020    7:56 PM  Hepatic Function  Total Protein 6.5 - 8.1 g/dL 7.0  7.7  7.2   Albumin 3.5 - 5.0 g/dL 3.9  4.1  3.9   AST 15 - 41 U/L 30  29  40   ALT 0 - 44 U/L 18  21  33   Alk Phosphatase 38 - 126 U/L 84  90  80   Total Bilirubin 0.3 - 1.2 mg/dL 0.9  0.7  0.5   Bilirubin, Direct 0.0 - 0.2 mg/dL  0.2  0.1       Review of Systems   All systems reviewed and negative except where noted in HPI.    Physical Exam  There were no vitals taken for this visit. No LMP recorded. Patient has had a hysterectomy. General:   Alert and oriented. Pleasant and cooperative. Well-nourished and  well-developed.  Head:  Normocephalic and atraumatic. Eyes:  Without icterus Ears:  Normal auditory acuity. Neck:  Supple; no masses or thyromegaly. Lungs:  Respirations even and unlabored.  Clear throughout to auscultation.   No wheezes, crackles, or rhonchi. No acute distress. Heart:  Regular rate and rhythm; no  murmurs, clicks, rubs, or gallops. Abdomen:  Normal bowel sounds.  No bruits.  Soft, non-tender and non-distended without masses, hepatosplenomegaly or hernias noted.  No guarding or rebound tenderness.  ***Negative Carnett sign.   Rectal:  Deferred.***  Msk:  Symmetrical without gross deformities. Normal posture. Extremities:  Without edema. Neurologic:  Alert and  oriented x4;  grossly normal neurologically. Skin:  Intact without significant lesions or rashes. Psych:  Alert and cooperative. Normal mood and affect.   Assessment & Plan   Andrea Pollard is a 68 y.o. female presenting today with    I discussed the assessment and treatment plan with the patient. The patient was provided an opportunity to ask questions and all were answered. The patient agreed with the plan and demonstrated an understanding of the instructions.   The patient was advised to call back or seek an in-person evaluation if the symptoms worsen or if the condition fails to improve as anticipated.  Grayce Bohr, DNP, AGNP-C [facility] Gastroenterology

## 2024-05-19 ENCOUNTER — Other Ambulatory Visit: Payer: Self-pay | Admitting: Physician Assistant

## 2024-05-20 ENCOUNTER — Telehealth: Payer: Self-pay

## 2024-05-20 DIAGNOSIS — K582 Mixed irritable bowel syndrome: Secondary | ICD-10-CM

## 2024-05-20 MED ORDER — DICYCLOMINE HCL 20 MG PO TABS: 20.0000 mg | ORAL_TABLET | Freq: Three times a day (TID) | ORAL | 1 refills | Status: AC

## 2024-05-20 NOTE — Telephone Encounter (Signed)
 Spoke with patient to let her know refills were sent to pharmacy and that she can take the bentyl  3 times daily before meals.

## 2024-05-20 NOTE — Telephone Encounter (Signed)
 Patient's call was transferred to me. However, the patient was calling to ask how she is to take her Bentyl  since she is having issues again with her abdomen.Patient has IBS C and D. Patient once saw Dr. Unk and then Ellouise Console, PA. She has not been see since 04/2023. Patient has an appointment scheduled with you but until 07/07/2024 and she stated that she can't wait that long to be seen or have a resolution to her issues. Patient requesting a refill on her Bentyl  20 MG TID. Please advise.

## 2024-05-20 NOTE — Telephone Encounter (Signed)
 Dicyclomine  refill sent in until patient is seen in Feb 2026.   Dorothe, please call and let her know that she can take Bentyl  3 times daily before meals. Thanks  Grayce Bohr, DNP, AGNP-C

## 2024-05-26 ENCOUNTER — Ambulatory Visit
Admission: EM | Admit: 2024-05-26 | Discharge: 2024-05-26 | Disposition: A | Discharge status: Home / Self Care | Payer: Self-pay | Attending: Family Medicine | Admitting: Family Medicine

## 2024-05-26 ENCOUNTER — Encounter: Payer: Self-pay | Admitting: Family Medicine

## 2024-05-26 DIAGNOSIS — N3000 Acute cystitis without hematuria: Secondary | ICD-10-CM

## 2024-05-26 DIAGNOSIS — K582 Mixed irritable bowel syndrome: Secondary | ICD-10-CM

## 2024-05-26 DIAGNOSIS — H1031 Unspecified acute conjunctivitis, right eye: Secondary | ICD-10-CM

## 2024-05-26 DIAGNOSIS — H9201 Otalgia, right ear: Secondary | ICD-10-CM

## 2024-05-26 LAB — POCT URINE DIPSTICK
Blood, UA: NEGATIVE
Glucose, UA: NEGATIVE mg/dL
Ketones, POC UA: NEGATIVE mg/dL
Leukocytes, UA: NEGATIVE
Nitrite, UA: POSITIVE — AB
Protein Ur, POC: 30 mg/dL — AB
Spec Grav, UA: >=1.03 — AB
Urobilinogen, UA: 0.2 U/dL
pH, UA: 5.5

## 2024-05-26 MED ORDER — NEOMYCIN-POLYMYXIN-HC 3.5-10000-1 OT SUSP: 4.0000 [drp] | Freq: Three times a day (TID) | OTIC | 0 refills | Status: AC

## 2024-05-26 MED ORDER — CEPHALEXIN 500 MG PO CAPS: 500.0000 mg | ORAL_CAPSULE | Freq: Four times a day (QID) | ORAL | 0 refills | Status: AC

## 2024-05-26 MED ORDER — ERYTHROMYCIN 5 MG/GM OP OINT: TOPICAL_OINTMENT | OPHTHALMIC | 0 refills | Status: AC

## 2024-05-26 NOTE — ED Provider Notes (Signed)
 " [facility]    CSN: [id number] Arrival date & time: 05/26/24  1804      History   Chief Complaint Chief Complaint  Patient presents with   Urinary Frequency   Otalgia   Eye Problem     HPI HPI Andrea Pollard is a 70 y.o. female.    Andrea Pollard presents for concern for UTI as she has lower back pain with frequent urination but only small amounts that started about a week. Has IBS and when she has diarrhea that gets in the front of her underwear. Has history of frequent urinary tract infections. Has  not had any antibiotics in last 30 days.  No LMP recorded. Patient has had a hysterectomy.   Has knee surgery in November and a UTI that showed E coli since.   Has been having right ear pain for the past couple days. No treatments prior to arrival.  She is hearing per her norm. No fever, cough or cold symptoms.    Has been having right eye crusting in the morning.      Past Medical History:  Diagnosis Date   Acute pain of left wrist 01/15/2023   Allergic rhinitis 01/01/2008   Anxiety    Arthritis of left acromioclavicular joint 01/15/2023   Arthritis of left wrist 01/15/2023   Asthma    Benign paroxysmal positional vertigo 06/01/2020   Carpal tunnel syndrome, left 11/11/2015   Cold intolerance 09/25/2020   Complication of anesthesia    COPD (chronic obstructive pulmonary disease) (HCC)    diagnosed from pulmonary function tests   Depression    Diabetes mellitus without complication (HCC)    DVT (deep venous thrombosis) (HCC)    Dvt femoral (deep venous thrombosis) (HCC) 2018   in lung also.  treated with xarelto  for 6 months   Encounter for other screening for malignant neoplasm of breast 11/17/2008   Fatigue 10/26/2008   Gastroparesis 05/22/2012   Generalized anxiety disorder 05/11/2014   GERD (gastroesophageal reflux disease)    Hair loss 11/10/2019   Hearing loss 07/05/2015   Hepatitis    AGE 38-HEPATITIS B   History of hiatal hernia     History of kidney stones 2019   History of methicillin resistant staphylococcus aureus (MRSA) 2013   in neck due to ingrown hair. had surgery to drain cyst   History of palpitations 2019   due to a panic attack   Hypertension    Intention tremor 04/16/2023   Irritable bowel syndrome 09/19/2021   Low back pain 11/01/2020   Lung nodule 2019   being followed by dr. jacobo, dr volney and dr. 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   Peripheral neuropathy 07/02/2007   Pneumonia 04/2016   PONV (postoperative nausea and vomiting)    NAUSEATED   Pulmonary emboli (HCC) 2018   Raynaud's phenomenon 05/14/2019   Rosacea 07/05/2015   Routine history and physical examination of adult 03/30/2008   Shortness of breath 06/28/2021   Sleep apnea    CPAP   Steatosis of liver 07/02/2007   Tendinitis of left shoulder 01/15/2023   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) 10/11/2016   Ulnar neuropathy of left upper extremity 03/22/2019   Venous stasis 09/07/2016   Vertigo    Yeast infection 10/25/2023    Patient Active Problem List   Diagnosis Date Noted   Acute cough 07/20/2023   Chronic obstructive pulmonary disease (HCC) 07/20/2023   History of colon polyps 06/07/2023   Obesity (BMI 30-39.9) 09/14/2022   Ataxia 09/14/2022   Polyp of descending colon 05/04/2022   Chronic diarrhea 04/20/2022   Hiatal hernia 04/20/2022   Adenomatous polyp of descending colon 04/20/2022   Anxiety 04/04/2022   Depression with suicidal ideation 04/04/2022   Sleep apnea 04/04/2022   Pain in joint of right shoulder 05/27/2021    Pulmonary embolism (HCC) 03/04/2017   Benign hypertension 10/05/2016   Constipation 10/05/2016   Gastroesophageal reflux disease 10/05/2016   Hypercholesterolemia 10/05/2016   Indigestion 10/05/2016   Obstructive sleep apnea of adult 10/05/2016   Type 2 diabetes mellitus (HCC) 10/05/2016   Vitamin D deficiency 10/05/2016   Coronary artery disease of native artery of native heart with stable angina pectoris 09/27/2016   Aortic atherosclerosis 09/27/2016   History of smoking 09/27/2016   Lung nodule 06/07/2016   Morbid obesity (HCC) 04/23/2014    Past Surgical History:  Procedure Laterality Date   ABDOMINAL HYSTERECTOMY  1994   CESAREAN SECTION     CHOLECYSTECTOMY  2013   COLONOSCOPY WITH PROPOFOL  N/A 04/20/2022   Procedure: COLONOSCOPY WITH PROPOFOL ;  Surgeon: Unk Corinn Skiff, MD;  Location: [facility] ENDOSCOPY;  Service: Gastroenterology;  Laterality: N/A;   COLONOSCOPY WITH PROPOFOL  N/A 06/07/2023   Procedure: COLONOSCOPY WITH PROPOFOL ;  Surgeon: Unk Corinn Skiff, MD;  Location: [facility] ENDOSCOPY;  Service: Gastroenterology;  Laterality: N/A;   CYST REMOVAL NECK  2013   was ingrown hair . positive for mrsa at that time prior to removal in OR   CYSTOSCOPY WITH URETEROSCOPY AND STENT PLACEMENT Right 05/31/2018   Procedure: CYSTOSCOPY WITH URETEROSCOPY, LASER LITHOTRIPSY AND STENT PLACEMENT;  Surgeon: Francisca Redell BROCKS, MD;  Location: [facility] ORS;  Service: Urology;  Laterality: Right;   DIAGNOSTIC LAPAROSCOPY     ELECTROMAGNETIC NAVIGATION BROCHOSCOPY N/A 08/15/2016   Procedure: ELECTROMAGNETIC NAVIGATION BRONCHOSCOPY;  Surgeon: Nickolas Cellar, MD;  Location: [facility] ORS;  Service: Cardiopulmonary;  Laterality: N/A;   ELECTROMAGNETIC NAVIGATION BROCHOSCOPY Right 11/07/2017   Procedure: ELECTROMAGNETIC NAVIGATION BRONCHOSCOPY;  Surgeon: Cellar Nickolas, MD;  Location: [facility] ORS;  Service: Cardiopulmonary;  Laterality: Right;   ESOPHAGOGASTRODUODENOSCOPY (EGD) WITH PROPOFOL  N/A 04/20/2022    Procedure: ESOPHAGOGASTRODUODENOSCOPY (EGD) WITH PROPOFOL ;  Surgeon: Unk Corinn Skiff, MD;  Location: [facility] ENDOSCOPY;  Service: Gastroenterology;  Laterality: N/A;   FLEXIBLE SIGMOIDOSCOPY N/A 05/04/2022   Procedure: FLEXIBLE SIGMOIDOSCOPY;  Surgeon: Unk Corinn Skiff, MD;  Location: [facility] ENDOSCOPY;  Service: Gastroenterology;  Laterality: N/A;   LAPAROSCOPIC GASTRIC SLEEVE RESECTION  2015   lost 75 pounds    OB History   No obstetric history on file.      Home Medications    Prior to Admission medications  Medication Sig Start Date End Date Taking? Authorizing Provider  albuterol  (VENTOLIN  HFA) 108 (90 Base) MCG/ACT inhaler Inhale 1-2 puffs into the lungs every 6 (six) hours as needed. 03/30/22  Yes Corlis Burnard DEL, NP  atorvastatin (LIPITOR) 80 MG tablet Take 80 mg by mouth at bedtime.  08/30/16  Yes [provider]  cephALEXin  (KEFLEX ) 500 MG capsule Take 1 capsule (500 mg total) by mouth 4 (four) times daily for 7 days. 05/26/24 06/02/24 Yes Graciela Plato, DO  empagliflozin (  JARDIANCE) 10 MG TABS tablet Take 10 mg by mouth daily.   Yes [provider]  erythromycin  ophthalmic ointment Place a 1/2 inch ribbon of ointment into the lower eyelid 3 times a day for 7 days. 05/26/24  Yes Madysen Faircloth, DO  FLOVENT HFA 220 MCG/ACT inhaler Inhale 2 puffs into the lungs daily. 12/11/16  Yes [provider]  gabapentin (NEURONTIN) 300 MG capsule Take 300 mg by mouth 3 (three) times daily.   Yes [provider]  hydrochlorothiazide (HYDRODIURIL) 25 MG tablet Take 25 mg by mouth daily.   Yes [provider]  hydrOXYzine (VISTARIL) 25 MG capsule Take 25-50 mg by mouth 3 (three) times daily as needed.   Yes [provider]  insulin  aspart (NOVOLOG  FLEXPEN) 100 UNIT/ML FlexPen Inject into the skin.   Yes [provider]  insulin  glargine (LANTUS SOLOSTAR) 100 UNIT/ML Solostar Pen    Yes [provider]  lisinopril (ZESTRIL) 10 MG  tablet Take 10 mg by mouth daily. 04/28/22  Yes [provider]  lisinopril-hydrochlorothiazide (ZESTORETIC) 10-12.5 MG tablet    Yes [provider]  LORazepam (ATIVAN) 1 MG tablet TAKE 1/2 TO 1 TABLET BY MOUTH 1 TO 2 TIMES PER DAY AS NEEDED FOR SEVERE ANXIETY AND PANIC ATTACKS   Yes [provider]  metFORMIN (GLUCOPHAGE) 1000 MG tablet TAKE 1 TABLET BY MOUTH TWICE DAILY WITH BREAKFAST AND DINNER FOR DIABETES   Yes [provider]  neomycin -polymyxin-hydrocortisone (CORTISPORIN) 3.5-10000-1 OTIC suspension Place 4 drops into the right ear 3 (three) times daily for 7 days. 05/26/24 06/02/24 Yes Jeffie Spivack, DO  OZEMPIC, 0.25 OR 0.5 MG/DOSE, 2 MG/1.5ML SOPN Inject 0.5 mg as directed once a week. 08/19/20  Yes [provider]  PARoxetine (PAXIL) 30 MG tablet Take 30 mg by mouth at bedtime. 07/03/23  Yes [provider]  REXULTI 1 MG TABS tablet Take 1 mg by mouth daily. 11/12/23  Yes [provider]  rivaroxaban  (XARELTO ) 10 MG TABS tablet Take 1 tablet every day by oral route for 10 days. 04/14/24  Yes [provider]  sertraline  (ZOLOFT ) 100 MG tablet Take 2 tablets (200 mg total) by mouth daily. 09/15/22  Yes Wouk, Devaughn Sayres, MD  Tiotropium Bromide (SPIRIVA HANDIHALER) 18 MCG CAPS    Yes [provider]  triamterene-hydrochlorothiazide (MAXZIDE) 75-50 MG tablet Take 1 tablet every day by oral route.   Yes [provider]  albuterol  (VENTOLIN  HFA) 108 (90 Base) MCG/ACT inhaler Inhale 1-2 puffs into the lungs every 6 (six) hours as needed for wheezing or shortness of breath. 11/06/23   Arvis Jolan NOVAK, PA-C  cefdinir  (OMNICEF ) 300 MG capsule Take 1 capsule (300 mg total) by mouth 2 (two) times daily. 12/02/23   Raylan Troiani, DO  clotrimazole (CLOTRIMAZOLE ANTI-FUNGAL) 1 % cream 1 Application. 10/01/23   [provider]  dicyclomine  (BENTYL ) 20 MG tablet Take 1 tablet (20 mg total) by mouth 3 (three) times  daily before meals. 05/20/24 07/19/24  Celestia Rima, NP  hydrocortisone cream 1 % 1 Application. 09/11/18   [provider]  lansoprazole (PREVACID) 15 MG capsule Take 15 mg by mouth at bedtime.     [provider]  LISINOPRIL PO     [provider]  magic mouthwash (lidocaine , diphenhydrAMINE, alum & mag hydroxide) suspension Swish and spit 15 mLs 4 (four) times daily as needed for mouth pain. 03/04/24   Bernardino Ditch, NP  meclizine  (ANTIVERT ) 25 MG tablet  [provider]  montelukast (SINGULAIR) 10 MG tablet Take 10 mg by mouth daily.    [provider]  ondansetron  (ZOFRAN ) 4 MG tablet Take 1 tablet (4 mg total) by mouth every 8 (eight) hours as needed for nausea or vomiting. 04/19/22   Unk Corinn Skiff, MD  [facility] VERIO test strip TEST ONCE D 09/21/18   Harlee Lynwood LABOR, MD  sertraline  (ZOLOFT ) 50 MG tablet Take 200 mg by mouth daily. 12/12/22   [provider]  traMADol  (ULTRAM ) 50 MG tablet Take 1 tablet every 4 hours by oral route as needed, for pain. 03/03/24   [provider]  traZODone (DESYREL) 150 MG tablet Take 150 mg by mouth at bedtime.    [provider]    Family History Family History  Problem Relation Age of Onset   Stroke Father    Ovarian cancer Maternal Grandmother    Breast cancer Neg Hx     Social History Social History[1]   Allergies   Ibuprofen, Levofloxacin, Nitroglycerin, Sulfamethoxazole-trimethoprim, Levaquin [levofloxacin in d5w], Sulfamethoxazole, Trimethoprim, and Metformin   Review of Systems Review of Systems: :negative unless otherwise stated in HPI.      Physical Exam Triage Vital Signs ED Triage Vitals [05/26/24 1901]  Encounter Vitals Group     BP 133/73     Girls Systolic BP Percentile      Girls Diastolic BP Percentile      Boys Systolic BP Percentile      Boys Diastolic BP Percentile      Pulse Rate 80     Resp 16     Temp 98.5 F (36.9 C)     Temp  Source Oral     SpO2 98 %     Weight 193 lb (87.5 kg)     Height      Head Circumference      Peak Flow      Pain Score 7     Pain Loc      Pain Education      Exclude from Growth Chart    No data found.  Updated Vital Signs BP 133/73 (BP Location: Right Arm)   Pulse 80   Temp 98.5 F (36.9 C) (Oral)   Resp 16   Wt 87.5 kg   SpO2 98%   BMI 32.12 kg/m   Visual Acuity Right Eye Distance:   Left Eye Distance:   Bilateral Distance:    Right Eye Near:   Left Eye Near:    Bilateral Near:     Physical Exam GEN: well appearing female in no acute distress  EYES: Bilateral conjunctival injection, PERRLA, extraocular motions intact HENT:  moist membranes, right TM normal but external auditory canal is erythematous with scaly discharge,  CVS: well perfused  RESP: speaking in full sentences without pause  ABD: soft, suprapubic tenderness, non-distended, no palpable masses, no CVA tenderness     UC Treatments / Results  Labs (all labs ordered are listed, but only abnormal results are displayed) Labs Reviewed  POCT URINE DIPSTICK - Abnormal; Notable for the following components:      Result Value   Bilirubin, UA small (*)    Spec Grav, UA >=1.030 (*)    Protein Ur, POC =30 (*)    Nitrite, UA Positive (*)    All other components within normal limits  URINE CULTURE    EKG   Radiology No results found.  Procedures Procedures (including critical care time)  Medications Ordered in UC  Medications - No data to display  Initial Impression / Assessment and Plan / UC Course  I have reviewed the triage vital signs and the nursing notes.  Pertinent labs & imaging results that were available during my care of the patient were reviewed by me and considered in my medical decision making (see chart for details).       Acute cystitis:  Patient is a 69 y.o. female  who presents for urinary frequency.  Overall patient is well-appearing and afebrile.  Vital signs stable.    POC urine dipstick consistent with acute cystitis.   Hematuria not  present but no  microscopy available.   Treat with Keflex  4 times daily for 7 days. Urine culture obtained.  Follow-up sensitivities and change antibiotics, if needed.     Acute Otitis externa  Overall patient is well-appearing, well-hydrated and without respiratory distress. Aviendha is afebrile. Treat with Cortisporin for 7 days.  Tylenol /Motrin's as needed for fever or discomfort.  Recommended regular cleaning of his ipods/headphones. Stressed importance of hydration.  School  note provided, per request. Discussed return and ED precautions, understanding voiced.  If symptoms persist recommended ENT follow-up.   Conjunctivitis : Erythromycin  ointment   Return precautions given.  Follow-up,  if symptoms not improving or getting worse. Discussed MDM, treatment plan and plan for follow-up with patient who agrees with plan.   Final Clinical Impressions(s) / UC Diagnoses   Final diagnoses:  Acute bacterial conjunctivitis of right eye  Acute otalgia, right  Acute cystitis without hematuria     Discharge Instructions      Stop by the pharmacy to pick up your prescriptions.  Follow up with your primary care provider or return to the urgent care, if not improving.   You have a urinary tract infection. Return with your urine for culture to be sure the antibiotic prescribed will treat your infection. Someone may call you to change antibiotics.      ED Prescriptions     Medication Sig Dispense Auth. Provider   cephALEXin  (KEFLEX ) 500 MG capsule Take 1 capsule (500 mg total) by mouth 4 (four) times daily for 7 days. 28 capsule Kathlyn Leachman, DO   neomycin -polymyxin-hydrocortisone (CORTISPORIN) 3.5-10000-1 OTIC suspension Place 4 drops into the right ear 3 (three) times daily for 7 days. 4.2 mL Therma Lasure, DO   erythromycin  ophthalmic ointment Place a 1/2 inch ribbon of ointment into the lower eyelid 3 times a day for 7  days. 3.5 g Sherlin Sonier, DO      PDMP not reviewed this encounter.      [1]  Social History Tobacco Use   Smoking status: Former    Current packs/day: 0.00    Average packs/day: 1 pack/day for 5.0 years (5.0 ttl pk-yrs)    Types: Cigarettes    Start date: 08/12/1986    Quit date: 08/12/1991    Years since quitting: 32.8   Smokeless tobacco: Never  Vaping Use   Vaping status: Never Used  Substance Use Topics   Alcohol use: No   Drug use: No     Kriste Berth, DO 05/30/24 1818  "

## 2024-05-26 NOTE — Discharge Instructions (Signed)
 Stop by the pharmacy to pick up your prescriptions.  Follow up with your primary care provider or return to the urgent care, if not improving.   You have a urinary tract infection. Return with your urine for culture to be sure the antibiotic prescribed will treat your infection. Someone may call you to change antibiotics.

## 2024-05-26 NOTE — ED Triage Notes (Addendum)
 Sx x 1 week  Urinary frequency Dysuria Back pain  Right eye is crusty when she wakes up x 2 days, feels like something is in it.  Right ear pain x 2 days

## 2024-05-29 ENCOUNTER — Other Ambulatory Visit: Payer: Self-pay

## 2024-05-29 DIAGNOSIS — K582 Mixed irritable bowel syndrome: Secondary | ICD-10-CM

## 2024-05-30 ENCOUNTER — Encounter: Payer: Self-pay | Admitting: Family Medicine

## 2024-06-01 ENCOUNTER — Ambulatory Visit
Admission: EM | Admit: 2024-06-01 | Discharge: 2024-06-01 | Disposition: A | Discharge status: Home / Self Care | Payer: Self-pay | Attending: Family Medicine | Admitting: Family Medicine

## 2024-06-01 ENCOUNTER — Emergency Department: Payer: Self-pay

## 2024-06-01 ENCOUNTER — Encounter: Payer: Self-pay | Admitting: Emergency Medicine

## 2024-06-01 ENCOUNTER — Emergency Department
Admission: EM | Admit: 2024-06-01 | Discharge: 2024-06-01 | Disposition: A | Discharge status: Home / Self Care | Payer: Self-pay | Attending: Emergency Medicine | Admitting: Emergency Medicine

## 2024-06-01 ENCOUNTER — Other Ambulatory Visit: Payer: Self-pay

## 2024-06-01 DIAGNOSIS — S60512A Abrasion of left hand, initial encounter: Secondary | ICD-10-CM

## 2024-06-01 DIAGNOSIS — Z96652 Presence of left artificial knee joint: Secondary | ICD-10-CM | POA: Insufficient documentation

## 2024-06-01 DIAGNOSIS — S51812A Laceration without foreign body of left forearm, initial encounter: Secondary | ICD-10-CM | POA: Insufficient documentation

## 2024-06-01 DIAGNOSIS — Y92009 Unspecified place in unspecified non-institutional (private) residence as the place of occurrence of the external cause: Secondary | ICD-10-CM | POA: Insufficient documentation

## 2024-06-01 DIAGNOSIS — E1143 Type 2 diabetes mellitus with diabetic autonomic (poly)neuropathy: Secondary | ICD-10-CM | POA: Insufficient documentation

## 2024-06-01 DIAGNOSIS — S0990XA Unspecified injury of head, initial encounter: Secondary | ICD-10-CM | POA: Insufficient documentation

## 2024-06-01 DIAGNOSIS — R519 Headache, unspecified: Secondary | ICD-10-CM

## 2024-06-01 DIAGNOSIS — I951 Orthostatic hypotension: Secondary | ICD-10-CM | POA: Insufficient documentation

## 2024-06-01 DIAGNOSIS — I10 Essential (primary) hypertension: Secondary | ICD-10-CM | POA: Insufficient documentation

## 2024-06-01 DIAGNOSIS — E1142 Type 2 diabetes mellitus with diabetic polyneuropathy: Secondary | ICD-10-CM | POA: Insufficient documentation

## 2024-06-01 DIAGNOSIS — J4489 Other specified chronic obstructive pulmonary disease: Secondary | ICD-10-CM | POA: Insufficient documentation

## 2024-06-01 DIAGNOSIS — S50811A Abrasion of right forearm, initial encounter: Secondary | ICD-10-CM

## 2024-06-01 DIAGNOSIS — M79601 Pain in right arm: Secondary | ICD-10-CM

## 2024-06-01 DIAGNOSIS — R42 Dizziness and giddiness: Secondary | ICD-10-CM

## 2024-06-01 DIAGNOSIS — S50812A Abrasion of left forearm, initial encounter: Secondary | ICD-10-CM

## 2024-06-01 DIAGNOSIS — W01198A Fall on same level from slipping, tripping and stumbling with subsequent striking against other object, initial encounter: Secondary | ICD-10-CM | POA: Insufficient documentation

## 2024-06-01 DIAGNOSIS — M79602 Pain in left arm: Secondary | ICD-10-CM

## 2024-06-01 DIAGNOSIS — W19XXXA Unspecified fall, initial encounter: Secondary | ICD-10-CM

## 2024-06-01 DIAGNOSIS — S51811A Laceration without foreign body of right forearm, initial encounter: Secondary | ICD-10-CM | POA: Insufficient documentation

## 2024-06-01 LAB — BASIC METABOLIC PANEL WITH GFR
Anion gap: 10 (ref 5–15)
BUN: 11 mg/dL (ref 8–23)
CO2: 27 mmol/L (ref 22–32)
Calcium: 9.5 mg/dL (ref 8.9–10.3)
Chloride: 104 mmol/L (ref 98–111)
Creatinine, Ser: 0.65 mg/dL (ref 0.44–1.00)
GFR, Estimated: >60 mL/min
Glucose, Bld: 77 mg/dL (ref 70–99)
Potassium: 3.7 mmol/L (ref 3.5–5.1)
Sodium: 140 mmol/L (ref 135–145)

## 2024-06-01 LAB — CBC WITH DIFFERENTIAL/PLATELET
Abs Immature Granulocytes: 0.02 K/uL (ref 0.00–0.07)
Basophils Absolute: 0.1 K/uL (ref 0.0–0.1)
Basophils Relative: 1 %
Eosinophils Absolute: 0.3 K/uL (ref 0.0–0.5)
Eosinophils Relative: 5 %
HCT: 36.4 % (ref 36.0–46.0)
Hemoglobin: 11 g/dL — ABNORMAL LOW (ref 12.0–15.0)
Immature Granulocytes: 0 %
Lymphocytes Relative: 27 %
Lymphs Abs: 1.7 K/uL (ref 0.7–4.0)
MCH: 23.6 pg — ABNORMAL LOW (ref 26.0–34.0)
MCHC: 30.2 g/dL (ref 30.0–36.0)
MCV: 77.9 fL — ABNORMAL LOW (ref 80.0–100.0)
Monocytes Absolute: 0.4 K/uL (ref 0.1–1.0)
Monocytes Relative: 6 %
Neutro Abs: 3.7 K/uL (ref 1.7–7.7)
Neutrophils Relative %: 61 %
Platelets: 211 K/uL (ref 150–400)
RBC: 4.67 MIL/uL (ref 3.87–5.11)
RDW: 14.3 % (ref 11.5–15.5)
WBC: 6.1 K/uL (ref 4.0–10.5)
nRBC: 0 % (ref 0.0–0.2)

## 2024-06-01 MED ORDER — ACETAMINOPHEN 325 MG PO TABS
975.0000 mg | ORAL_TABLET | Freq: Once | ORAL | Status: AC
  Administered 2024-06-01: 975 mg via ORAL

## 2024-06-01 NOTE — ED Triage Notes (Signed)
 Patient states that she fell in her driveway which was concrete about 30 min ago.  Patient states that her daughter brought her.  Patient states that she was walking down her ramp and fell.  Patient has several skin tears on both arms with some bleeding.  Patient states that she hit the front of her head and has a headache.

## 2024-06-01 NOTE — ED Notes (Signed)
 Patient is being discharged from the Urgent Care and sent to the Emergency Department via POV . Per Dr. Kriste, patient is in need of higher level of care due to Fall. Patient is aware and verbalizes understanding of plan of care.  Vitals:   06/01/24 1144  BP: (!) 130/105  Pulse: 95  Resp: 16  Temp: 98.3 F (36.8 C)  SpO2: 93%

## 2024-06-01 NOTE — ED Triage Notes (Addendum)
 Pt to ed from home via [facility] for  a mechanical fall. Pt is caox4, in no acute distress. Pt advised she feels dizzy. Pt denies thinners and does admitts hitting her head. Pt has bilateral forearm bandages placed by [facility] for multiple skin tears.

## 2024-06-01 NOTE — ED Provider Notes (Signed)
 " [facility]    CSN: [id number] Arrival date & time: 06/01/24  1127      History   Chief Complaint Chief Complaint  Patient presents with   Fall   Abrasion    HPI Andrea Pollard is a 69 y.o. female.   HPI   Andrea Pollard presents for fell today just prior to arrival. She hit her head and she has a headache.  Bilateral forearm pain and scrapes along her forearm.  States that she was bleeding all over her daughter's car who brought her to the urgent care.  Walking in the parking lot and tripped over her feet hitting her head on the pavement.  She did not blackout however she did require help getting up.  Her daughter and a nice gentleman helped her.  She takes aspirin.    Past Medical History:  Diagnosis Date   Acute pain of left wrist 01/15/2023   Allergic rhinitis 01/01/2008   Anxiety    Arthritis of left acromioclavicular joint 01/15/2023   Arthritis of left wrist 01/15/2023   Asthma    Benign paroxysmal positional vertigo 06/01/2020   Carpal tunnel syndrome, left 11/11/2015   Cold intolerance 09/25/2020   Complication of anesthesia    COPD (chronic obstructive pulmonary disease) (HCC)    diagnosed from pulmonary function tests   Depression    Diabetes mellitus without complication (HCC)    DVT (deep venous thrombosis) (HCC)    Dvt femoral (deep venous thrombosis) (HCC) 2018   in lung also.  treated with xarelto  for 6 months   Encounter for other screening for malignant neoplasm of breast 11/17/2008   Fatigue 10/26/2008   Gastroparesis 05/22/2012   Generalized anxiety disorder 05/11/2014   GERD (gastroesophageal reflux disease)    Hair loss 11/10/2019   Hearing loss 07/05/2015   Hepatitis    AGE 74-HEPATITIS B   History of hiatal hernia    History of kidney stones 2019   History of methicillin resistant staphylococcus aureus (MRSA) 2013   in neck due to ingrown hair. had surgery to drain cyst   History of palpitations 2019   due to a panic  attack   Hypertension    Intention tremor 04/16/2023   Irritable bowel syndrome 09/19/2021   Low back pain 11/01/2020   Lung nodule 2019   being followed by dr. jacobo, dr volney and dr. 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   Peripheral neuropathy 07/02/2007   Pneumonia 04/2016   PONV (postoperative nausea and vomiting)    NAUSEATED   Pulmonary emboli (HCC) 2018   Raynaud's phenomenon 05/14/2019   Rosacea 07/05/2015   Routine history and physical examination of adult 03/30/2008   Shortness of breath 06/28/2021   Sleep apnea    CPAP   Steatosis of liver 07/02/2007   Tendinitis of left shoulder 01/15/2023   Type 2 diabetes mellitus with peripheral neuropathy (HCC) 10/11/2016   Ulnar neuropathy of left upper extremity 03/22/2019   Venous stasis 09/07/2016   Vertigo    Yeast infection 10/25/2023    Patient Active Problem List   Diagnosis Date Noted   Acute cough 07/20/2023   Chronic  obstructive pulmonary disease (HCC) 07/20/2023   History of colon polyps 06/07/2023   Obesity (BMI 30-39.9) 09/14/2022   Ataxia 09/14/2022   Polyp of descending colon 05/04/2022   Chronic diarrhea 04/20/2022   Hiatal hernia 04/20/2022   Adenomatous polyp of descending colon 04/20/2022   Anxiety 04/04/2022   Depression with suicidal ideation 04/04/2022   Sleep apnea 04/04/2022   Pain in joint of right shoulder 05/27/2021   Pulmonary embolism (HCC) 03/04/2017   Benign hypertension 10/05/2016   Constipation 10/05/2016   Gastroesophageal reflux disease 10/05/2016   Hypercholesterolemia 10/05/2016   Indigestion 10/05/2016    Obstructive sleep apnea of adult 10/05/2016   Type 2 diabetes mellitus (HCC) 10/05/2016   Vitamin D deficiency 10/05/2016   Coronary artery disease of native artery of native heart with stable angina pectoris 09/27/2016   Aortic atherosclerosis 09/27/2016   History of smoking 09/27/2016   Lung nodule 06/07/2016   Morbid obesity (HCC) 04/23/2014    Past Surgical History:  Procedure Laterality Date   ABDOMINAL HYSTERECTOMY  1994   CESAREAN SECTION     CHOLECYSTECTOMY  2013   COLONOSCOPY WITH PROPOFOL  N/A 04/20/2022   Procedure: COLONOSCOPY WITH PROPOFOL ;  Surgeon: Unk Corinn Skiff, MD;  Location: [facility] ENDOSCOPY;  Service: Gastroenterology;  Laterality: N/A;   COLONOSCOPY WITH PROPOFOL  N/A 06/07/2023   Procedure: COLONOSCOPY WITH PROPOFOL ;  Surgeon: Unk Corinn Skiff, MD;  Location: [facility] ENDOSCOPY;  Service: Gastroenterology;  Laterality: N/A;   CYST REMOVAL NECK  2013   was ingrown hair . positive for mrsa at that time prior to removal in OR   CYSTOSCOPY WITH URETEROSCOPY AND STENT PLACEMENT Right 05/31/2018   Procedure: CYSTOSCOPY WITH URETEROSCOPY, LASER LITHOTRIPSY AND STENT PLACEMENT;  Surgeon: Francisca Redell BROCKS, MD;  Location: [facility] ORS;  Service: Urology;  Laterality: Right;   DIAGNOSTIC LAPAROSCOPY     ELECTROMAGNETIC NAVIGATION BROCHOSCOPY N/A 08/15/2016   Procedure: ELECTROMAGNETIC NAVIGATION BRONCHOSCOPY;  Surgeon: Nickolas Cellar, MD;  Location: [facility] ORS;  Service: Cardiopulmonary;  Laterality: N/A;   ELECTROMAGNETIC NAVIGATION BROCHOSCOPY Right 11/07/2017   Procedure: ELECTROMAGNETIC NAVIGATION BRONCHOSCOPY;  Surgeon: Cellar Nickolas, MD;  Location: [facility] ORS;  Service: Cardiopulmonary;  Laterality: Right;   ESOPHAGOGASTRODUODENOSCOPY (EGD) WITH PROPOFOL  N/A 04/20/2022   Procedure: ESOPHAGOGASTRODUODENOSCOPY (EGD) WITH PROPOFOL ;  Surgeon: Unk Corinn Skiff, MD;  Location: [facility] ENDOSCOPY;  Service: Gastroenterology;  Laterality: N/A;   FLEXIBLE SIGMOIDOSCOPY N/A 05/04/2022    Procedure: FLEXIBLE SIGMOIDOSCOPY;  Surgeon: Unk Corinn Skiff, MD;  Location: [facility] ENDOSCOPY;  Service: Gastroenterology;  Laterality: N/A;   LAPAROSCOPIC GASTRIC SLEEVE RESECTION  2015   lost 75 pounds   TOTAL KNEE ARTHROPLASTY Left     OB History   No obstetric history on file.      Home Medications    Prior to Admission medications  Medication Sig Start Date End Date Taking? Authorizing Provider  albuterol  (VENTOLIN  HFA) 108 (90 Base) MCG/ACT inhaler Inhale 1-2 puffs into the lungs every 6 (six) hours as needed. 03/30/22   Corlis Burnard DEL, NP  albuterol  (VENTOLIN  HFA) 108 (90 Base) MCG/ACT inhaler Inhale 1-2 puffs into the lungs every 6 (six) hours as needed for wheezing or shortness of breath. 11/06/23   Arvis Jolan NOVAK, PA-C  atorvastatin (LIPITOR) 80 MG tablet Take 80 mg by mouth at bedtime.  08/30/16   [provider]  cefdinir  (OMNICEF ) 300 MG capsule Take 1 capsule (300 mg total) by mouth 2 (two) times daily. 12/02/23   Nikash Mortensen, DO  cephALEXin  (KEFLEX ) 500 MG capsule Take 1 capsule (  500 mg total) by mouth 4 (four) times daily for 7 days. 05/26/24 06/02/24  Jinelle Butchko, DO  clotrimazole (CLOTRIMAZOLE ANTI-FUNGAL) 1 % cream 1 Application. 10/01/23   [provider]  dicyclomine  (BENTYL ) 20 MG tablet Take 1 tablet (20 mg total) by mouth 3 (three) times daily before meals. 05/20/24 07/19/24  Celestia Rima, NP  empagliflozin (JARDIANCE) 10 MG TABS tablet Take 10 mg by mouth daily.    [provider]  erythromycin  ophthalmic ointment Place a 1/2 inch ribbon of ointment into the lower eyelid 3 times a day for 7 days. 05/26/24   Sylvan Sookdeo, Caprice, DO  FLOVENT HFA 220 MCG/ACT inhaler Inhale 2 puffs into the lungs daily. 12/11/16   [provider]  gabapentin (NEURONTIN) 300 MG capsule Take 300 mg by mouth 3 (three) times daily.    [provider]  hydrochlorothiazide (HYDRODIURIL) 25 MG tablet Take 25 mg by mouth daily.    [provider]  hydrocortisone cream 1 % 1 Application. 09/11/18   [provider]  hydrOXYzine (VISTARIL) 25 MG capsule Take 25-50 mg by mouth 3 (three) times daily as needed.    [provider]  insulin  aspart (NOVOLOG  FLEXPEN) 100 UNIT/ML FlexPen Inject into the skin.    [provider]  insulin  glargine (LANTUS SOLOSTAR) 100 UNIT/ML Solostar Pen     [provider]  lansoprazole (PREVACID) 15 MG capsule Take 15 mg by mouth at bedtime.     [provider]  lisinopril (ZESTRIL) 10 MG tablet Take 10 mg by mouth daily. 04/28/22   [provider]  LISINOPRIL PO     [provider]  lisinopril-hydrochlorothiazide (ZESTORETIC) 10-12.5 MG tablet     [provider]  LORazepam (ATIVAN) 1 MG tablet TAKE 1/2 TO 1 TABLET BY MOUTH 1 TO 2 TIMES PER DAY AS NEEDED FOR SEVERE ANXIETY AND PANIC ATTACKS    [provider]  magic mouthwash (lidocaine , diphenhydrAMINE, alum & mag hydroxide) suspension Swish and spit 15 mLs 4 (four) times daily as needed for mouth pain. 03/04/24   Bernardino Ditch, NP  meclizine  (ANTIVERT ) 25 MG tablet     [provider]  metFORMIN (GLUCOPHAGE) 1000 MG tablet TAKE 1 TABLET BY MOUTH TWICE DAILY WITH BREAKFAST AND DINNER FOR DIABETES    [provider]  montelukast (SINGULAIR) 10 MG tablet Take 10 mg by mouth daily.    [provider]  neomycin -polymyxin-hydrocortisone (CORTISPORIN) 3.5-10000-1 OTIC suspension Place 4 drops into the right ear 3 (three) times daily for 7 days. 05/26/24 06/02/24  Finola Rosal, DO  ondansetron  (ZOFRAN ) 4 MG tablet Take 1 tablet (4 mg total) by mouth every 8 (eight) hours as needed for nausea or vomiting. 04/19/22   Unk Corinn Skiff, MD  [facility] VERIO test strip TEST ONCE D 09/21/18   Harlee Lynwood LABOR, MD  OZEMPIC, 0.25 OR 0.5 MG/DOSE, 2 MG/1.5ML SOPN Inject 0.5 mg as directed once a week. 08/19/20   [provider]  PARoxetine (PAXIL) 30 MG tablet Take 30  mg by mouth at bedtime. 07/03/23   [provider]  REXULTI 1 MG TABS tablet Take 1 mg by mouth daily. 11/12/23   [provider]  rivaroxaban  (XARELTO ) 10 MG TABS tablet Take 1 tablet every day by oral route for 10 days. 04/14/24   [provider]  sertraline  (ZOLOFT ) 100 MG tablet Take 2 tablets (200 mg total) by mouth daily. 09/15/22   Wouk, Devaughn Sayres, MD  sertraline  (ZOLOFT ) 50 MG tablet  Take 200 mg by mouth daily. 12/12/22   [provider]  Tiotropium Bromide (SPIRIVA HANDIHALER) 18 MCG CAPS     [provider]  traMADol  (ULTRAM ) 50 MG tablet Take 1 tablet every 4 hours by oral route as needed, for pain. 03/03/24   [provider]  traZODone (DESYREL) 150 MG tablet Take 150 mg by mouth at bedtime.    [provider]  triamterene-hydrochlorothiazide (MAXZIDE) 75-50 MG tablet Take 1 tablet every day by oral route.    [provider]    Family History Family History  Problem Relation Age of Onset   Stroke Father    Ovarian cancer Maternal Grandmother    Breast cancer Neg Hx     Social History Social History[1]   Allergies   Ibuprofen, Levofloxacin, Nitroglycerin, Sulfamethoxazole-trimethoprim, Levaquin [levofloxacin in d5w], Sulfamethoxazole, Trimethoprim, and Metformin   Review of Systems Review of Systems: negative unless otherwise stated in HPI.      Physical Exam Triage Vital Signs ED Triage Vitals  Encounter Vitals Group     BP 06/01/24 1144 (!) 130/105     Girls Systolic BP Percentile --      Girls Diastolic BP Percentile --      Boys Systolic BP Percentile --      Boys Diastolic BP Percentile --      Pulse Rate 06/01/24 1144 95     Resp 06/01/24 1144 16     Temp 06/01/24 1144 98.3 F (36.8 C)     Temp Source 06/01/24 1144 Oral     SpO2 06/01/24 1144 93 %     Weight 06/01/24 1142 192 lb 14.4 oz (87.5 kg)     Height 06/01/24 1142 5' 5 (1.651 m)     Head Circumference --      Peak Flow --       Pain Score 06/01/24 1142 8     Pain Loc --      Pain Education --      Exclude from Growth Chart --    No data found.  Updated Vital Signs BP (!) 130/105 (BP Location: Left Arm)   Pulse 95   Temp 98.3 F (36.8 C) (Oral)   Resp 16   Ht 5' 5 (1.651 m)   Wt 87.5 kg   SpO2 93%   BMI 32.10 kg/m   Visual Acuity Right Eye Distance:   Left Eye Distance:   Bilateral Distance:    Right Eye Near:   Left Eye Near:    Bilateral Near:     Physical Exam GEN: Alert, female in no acute distress  EYES: Extraocular movements intact, pupils equal round and reactive to light HENT: Moist mucous membranes CV: regular rate  RESP: no increased work of breathing, clear to ascultation bilaterally MSK: No extremity edema or deformities, seated in wheelchair SKIN: warm, dry, + bilateral forearm and left hand abrasions see images below NEURO: alert, moves all extremities appropriately,  alert and oriented, normal speech, normal speech           UC Treatments / Results  Labs (all labs ordered are listed, but only abnormal results are displayed) Labs Reviewed - No data to display  EKG   Radiology No results found.  Procedures Procedures (including critical care time)  Medications Ordered in UC Medications  acetaminophen  (TYLENOL ) tablet 975 mg (975 mg Oral Given 06/01/24 1205)    Initial Impression / Assessment and Plan / UC Course  I have reviewed the triage vital signs  and the nursing notes.  Pertinent labs & imaging results that were available during my care of the patient were reviewed by me and considered in my medical decision making (see chart for details).       Patient is a 69 y.o. female who presents for head injury that occurred just prior to arrival.  Given her head strike and subsequent headache it is recommended that she have a CT head to ensure that she does not have a intracranial hemorrhage.  Recommended ED evaluation and she is agreeable.  She will  call her daughter to take her to the ED.  She was given Tylenol  for pain.  Wounds were cleansed and wrapped with sterile dressing prior to discharge.  Called [facility] ED and spoke with the triage RN who await patient's arrival.  Discussed MDM, treatment plan and plan for follow-up with patient who agrees with plan.      Final Clinical Impressions(s) / UC Diagnoses   Final diagnoses:  Fall, initial encounter  Acute nonintractable headache, unspecified headache type  Bilateral arm pain  Abrasion of left hand, initial encounter  Abrasion of left forearm, initial encounter  Abrasion of right forearm, initial encounter     Discharge Instructions      We cleaned and dressed your wounds today.  Given you head strike on the pavement and subsequent headache, you have been advised to follow up immediately in the emergency department for concerning signs or symptoms as discussed during your visit. If you declined EMS transport, please have a family member take you directly to the ED at this time. Do not delay.   Based on concerns about condition, if you do not follow up in the ED, you may risk poor outcomes including worsening of condition, delayed treatment and potentially life threatening issues. If you have declined to go to the ED at this time, you should call your PCP immediately to set up a follow up appointment.     ED Prescriptions   None    PDMP not reviewed this encounter.     [1]  Social History Tobacco Use   Smoking status: Former    Current packs/day: 0.00    Average packs/day: 1 pack/day for 5.0 years (5.0 ttl pk-yrs)    Types: Cigarettes    Start date: 08/12/1986    Quit date: 08/12/1991    Years since quitting: 32.8   Smokeless tobacco: Never  Vaping Use   Vaping status: Never Used  Substance Use Topics   Alcohol use: No   Drug use: No     Kriste Berth, DO 06/01/24 1349  "

## 2024-06-01 NOTE — ED Triage Notes (Signed)
 First Nurse Note:  Pt via [facility] from home. Pt c/o mechanical fall 2 hours ago. Pt fell and hit her head. Denies blood thinner. Denies LOC. Pt was seen at [facility] and was discharge but states since her fall she has been feeling dizzy. Pt is A&OX4 and NAD  EMS reports:  82 HR 98.0 orally  157/71 BP  92 CBG  99% on RA

## 2024-06-01 NOTE — ED Provider Notes (Signed)
 "  [facility] Provider Note    Event Date/Time   First MD Initiated Contact with Patient 06/01/24 1836     (approximate)   History   Chief Complaint: Fall   HPI  Andrea Pollard is a 69 y.o. female with a history of hypertension, COPD, gastroparesis, GERD, diabetes, venous stasis disease, peripheral neuropathy reports being in her usual state of health earlier today, when she stood up at home and subsequently fell lightheaded, causing her to fall down.  Reports that she has been limiting her oral intake and an intentional attempt to lose weight.  She feels like she is drinking adequate water, but only eating erratically, may be once or twice a day.  Reports she last ate last night, had 2 pieces of toast.  Denies any preceding pain such as headache vision change chest pain shortness of breath palpitations.  Denies loss of consciousness.  Currently feels well.        Past Medical History:  Diagnosis Date   Acute pain of left wrist 01/15/2023   Allergic rhinitis 01/01/2008   Anxiety    Arthritis of left acromioclavicular joint 01/15/2023   Arthritis of left wrist 01/15/2023   Asthma    Benign paroxysmal positional vertigo 06/01/2020   Carpal tunnel syndrome, left 11/11/2015   Cold intolerance 09/25/2020   Complication of anesthesia    COPD (chronic obstructive pulmonary disease) (HCC)    diagnosed from pulmonary function tests   Depression    Diabetes mellitus without complication (HCC)    DVT (deep venous thrombosis) (HCC)    Dvt femoral (deep venous thrombosis) (HCC) 2018   in lung also.  treated with xarelto  for 6 months   Encounter for other screening for malignant neoplasm of breast 11/17/2008   Fatigue 10/26/2008   Gastroparesis 05/22/2012   Generalized anxiety disorder 05/11/2014   GERD (gastroesophageal reflux disease)    Hair loss 11/10/2019   Hearing loss 07/05/2015   Hepatitis    AGE 69-HEPATITIS B   History of hiatal hernia     History of kidney stones 2019   History of methicillin resistant staphylococcus aureus (MRSA) 2013   in neck due to ingrown hair. had surgery to drain cyst   History of palpitations 2019   due to a panic attack   Hypertension    Intention tremor 04/16/2023   Irritable bowel syndrome 09/19/2021   Low back pain 11/01/2020   Lung nodule 2019   being followed by dr. jacobo, dr volney and dr. 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   Peripheral neuropathy 07/02/2007   Pneumonia 04/2016   PONV (postoperative nausea and vomiting)    NAUSEATED   Pulmonary emboli (HCC) 2018   Raynaud's phenomenon 05/14/2019   Rosacea 07/05/2015   Routine history and physical examination of adult 03/30/2008   Shortness of breath 06/28/2021   Sleep apnea    CPAP   Steatosis of liver 07/02/2007   Tendinitis of left shoulder 01/15/2023   Type 2 diabetes mellitus with peripheral neuropathy (HCC) 10/11/2016   Ulnar neuropathy of left upper extremity 03/22/2019   Venous stasis 09/07/2016   Vertigo  Yeast infection 10/25/2023    Current Outpatient Rx   Order #: [id number] Class: Normal   Order #: [id number] Class: Normal   Order #: [id number] Class: Historical Med   Order #: [id number] Class: Normal   Order #: [id number] Class: Normal   Order #: [id number] Class: Historical Med   Order #: [id number] Class: Normal   Order #: [id number] Class: Historical Med   Order #: [id number] Class: Normal   Order #: [id number] Class: Historical Med   Order #: [id number] Class: Historical Med   Order #: [id number] Class: Historical Med   Order #: [id number] Class: Historical Med    Order #: [id number] Class: Historical Med   Order #: [id number] Class: Historical Med   Order #: [id number] Class: Historical Med   Order #: [id number] Class: Historical Med   Order #: [id number] Class: Historical Med   Order #: [id number] Class: Historical Med   Order #: [id number] Class: Historical Med   Order #: [id number] Class: Historical Med   Order #: [id number] Class: Normal   Order #: [id number] Class: Historical Med   Order #: [id number] Class: Historical Med   Order #: [id number] Class: Historical Med   Order #: [id number] Class: Normal   Order #: [id number] Class: Normal   Order #: [id number] Class: Normal   Order #: [id number] Class: Historical Med   Order #: [id number] Class: Historical Med   Order #: [id number] Class: Historical Med   Order #: [id number] Class: Historical Med   Order #: [id number] Class: No Print   Order #: [id number] Class: Historical Med   Order #: [id number] Class: Historical Med   Order #: [id number] Class: Historical Med   Order #: [id number] Class: Historical Med   Order #: [id number] Class: Historical Med    Past Surgical History:  Procedure Laterality Date   ABDOMINAL HYSTERECTOMY  1994   CESAREAN SECTION     CHOLECYSTECTOMY  2013   COLONOSCOPY WITH PROPOFOL  N/A 04/20/2022   Procedure: COLONOSCOPY WITH PROPOFOL ;  Surgeon: Unk Corinn Skiff, MD;  Location: [facility] ENDOSCOPY;  Service: Gastroenterology;  Laterality: N/A;   COLONOSCOPY WITH PROPOFOL  N/A 06/07/2023   Procedure: COLONOSCOPY WITH PROPOFOL ;  Surgeon: Unk Corinn Skiff, MD;  Location: [facility] ENDOSCOPY;  Service: Gastroenterology;  Laterality: N/A;   CYST REMOVAL NECK  2013   was ingrown hair . positive for mrsa at that time prior to removal in OR   CYSTOSCOPY WITH URETEROSCOPY AND STENT PLACEMENT Right 05/31/2018   Procedure: CYSTOSCOPY WITH URETEROSCOPY, LASER LITHOTRIPSY AND STENT PLACEMENT;  Surgeon: Francisca Redell BROCKS, MD;  Location: [facility] ORS;  Service: Urology;  Laterality: Right;   DIAGNOSTIC LAPAROSCOPY     ELECTROMAGNETIC  NAVIGATION BROCHOSCOPY N/A 08/15/2016   Procedure: ELECTROMAGNETIC NAVIGATION BRONCHOSCOPY;  Surgeon: Nickolas Cellar, MD;  Location: [facility] ORS;  Service: Cardiopulmonary;  Laterality: N/A;   ELECTROMAGNETIC NAVIGATION BROCHOSCOPY Right 11/07/2017   Procedure: ELECTROMAGNETIC NAVIGATION BRONCHOSCOPY;  Surgeon: Cellar Nickolas, MD;  Location: [facility] ORS;  Service: Cardiopulmonary;  Laterality: Right;   ESOPHAGOGASTRODUODENOSCOPY (EGD) WITH PROPOFOL  N/A 04/20/2022   Procedure: ESOPHAGOGASTRODUODENOSCOPY (EGD) WITH PROPOFOL ;  Surgeon: Unk Corinn Skiff, MD;  Location: [facility] ENDOSCOPY;  Service: Gastroenterology;  Laterality: N/A;   FLEXIBLE SIGMOIDOSCOPY N/A 05/04/2022   Procedure: FLEXIBLE SIGMOIDOSCOPY;  Surgeon: Unk Corinn Skiff, MD;  Location: [facility] ENDOSCOPY;  Service: Gastroenterology;  Laterality: N/A;   LAPAROSCOPIC GASTRIC SLEEVE RESECTION  2015   lost 75 pounds   TOTAL KNEE ARTHROPLASTY Left     Physical Exam   Triage Vital Signs: ED Triage Vitals [06/01/24 1551]  Encounter Vitals Group     BP 132/67     Girls Systolic BP Percentile      Girls Diastolic BP Percentile  Boys Systolic BP Percentile      Boys Diastolic BP Percentile      Pulse Rate 69     Resp 16     Temp 97.9 F (36.6 C)     Temp Source Oral     SpO2 96 %     Weight      Height 5' 5 (1.651 m)     Head Circumference      Peak Flow      Pain Score 5     Pain Loc      Pain Education      Exclude from Growth Chart     Most recent vital signs: Vitals:   06/01/24 1551  BP: 132/67  Pulse: 69  Resp: 16  Temp: 97.9 F (36.6 C)  SpO2: 96%    General: Awake, no distress.  CV:  Good peripheral perfusion.  Regular rate rhythm, normal distal pulses Resp:  Normal effort.  Clear lungs Abd:  No distention.  Soft nontender Other:  Moist oral mucosa, full range of motion all extremities, no focal bony tenderness.  Skin tears on bilateral arms.   ED Results / Procedures / Treatments   Labs (all labs ordered  are listed, but only abnormal results are displayed) Labs Reviewed  CBC WITH DIFFERENTIAL/PLATELET - Abnormal; Notable for the following components:      Result Value   Hemoglobin 11.0 (*)    MCV 77.9 (*)    MCH 23.6 (*)    All other components within normal limits  BASIC METABOLIC PANEL WITH GFR     EKG Interpreted by me Sinus rhythm rate of 77.  Normal axis intervals QRS ST segments and T waves   RADIOLOGY CT head interpreted by me, negative for intracranial hemorrhage.  Radiology report reviewed. CT cervical spine unremarkable   PROCEDURES:  Procedures   MEDICATIONS ORDERED IN ED: Medications - No data to display   IMPRESSION / MDM / ASSESSMENT AND PLAN / ED COURSE  I reviewed the triage vital signs and the nursing notes.  DDx: Dehydration, electrolyte derangement, AKI, anemia, intracranial hemorrhage, C-spine fracture  Patient's presentation is most consistent with acute presentation with potential threat to life or bodily function.  Patient presents with orthostatic dizziness in the setting of intentional diet restriction.  Suspect dehydration.  No worrisome prodromal symptoms to suggest ACS PE dissection arrhythmia stroke.  No signs of trauma on exam.  CT head and cervical spine reassuring, labs unremarkable.  Recommended IV fluids and orthostatic vitals testing, but patient declines wishes to be discharged now which I think is reasonable.  She will follow-up with her doctor.  Discussed hydration strategies.       FINAL CLINICAL IMPRESSION(S) / ED DIAGNOSES   Final diagnoses:  Minor head injury, initial encounter  Orthostatic dizziness     Rx / DC Orders   ED Discharge Orders     None        Note:  This document was prepared using Dragon voice recognition software and may include unintentional dictation errors.   Viviann Pastor, MD 06/01/24 1911  "

## 2024-06-01 NOTE — Discharge Instructions (Signed)
 We cleaned and dressed your wounds today.  Given you head strike on the pavement and subsequent headache, you have been advised to follow up immediately in the emergency department for concerning signs or symptoms as discussed during your visit. If you declined EMS transport, please have a family member take you directly to the ED at this time. Do not delay.   Based on concerns about condition, if you do not follow up in the ED, you may risk poor outcomes including worsening of condition, delayed treatment and potentially life threatening issues. If you have declined to go to the ED at this time, you should call your PCP immediately to set up a follow up appointment.

## 2024-06-02 ENCOUNTER — Telehealth: Payer: Self-pay | Admitting: *Deleted

## 2024-06-02 NOTE — Telephone Encounter (Signed)
 Received call from pt that she went to the ED at [facility] in Nov for abdominal symptoms. She had at CT scan of the abd/pelvis during her visit which found an incidental lung nodule in the right middle lobe and other small nodules noted bilaterally. Pt has been receiving calls from the providers at [facility] to follow up on results. Pt called to get advice if needed further imaging since she has been followed by Dr. Jacobo for several years for lung nodules. Per Dr. Jacobo, pt does not require further imaging at this time since recent findings are similar to her last scan with our clinic. Pt made aware of MD recommendations and verbalized understanding. Instructed pt to call back with any other questions or needs. Pt verbalized understanding.

## 2024-06-04 ENCOUNTER — Other Ambulatory Visit: Payer: Self-pay | Admitting: Physician Assistant

## 2024-06-04 DIAGNOSIS — M25462 Effusion, left knee: Secondary | ICD-10-CM

## 2024-06-05 ENCOUNTER — Ambulatory Visit
Admission: RE | Admit: 2024-06-05 | Discharge: 2024-06-05 | Disposition: A | Payer: Self-pay | Source: Ambulatory Visit | Attending: Physician Assistant | Admitting: Physician Assistant

## 2024-06-05 DIAGNOSIS — M25462 Effusion, left knee: Secondary | ICD-10-CM | POA: Insufficient documentation

## 2024-07-07 ENCOUNTER — Ambulatory Visit: Admitting: Family Medicine
